# Patient Record
Sex: Male | Born: 1940
Health system: Southern US, Community
[De-identification: ages and names within clinical notes are randomized; demographics above are authoritative.]

## PROBLEM LIST (undated history)

## (undated) DIAGNOSIS — Q549 Hypospadias, unspecified: Secondary | ICD-10-CM

## (undated) DIAGNOSIS — T7840XA Allergy, unspecified, initial encounter: Secondary | ICD-10-CM

## (undated) DIAGNOSIS — E785 Hyperlipidemia, unspecified: Secondary | ICD-10-CM

## (undated) DIAGNOSIS — I10 Essential (primary) hypertension: Secondary | ICD-10-CM

## (undated) DIAGNOSIS — I2510 Atherosclerotic heart disease of native coronary artery without angina pectoris: Secondary | ICD-10-CM

## (undated) DIAGNOSIS — E7800 Pure hypercholesterolemia, unspecified: Secondary | ICD-10-CM

## (undated) DIAGNOSIS — G4730 Sleep apnea, unspecified: Secondary | ICD-10-CM

## (undated) DIAGNOSIS — K219 Gastro-esophageal reflux disease without esophagitis: Secondary | ICD-10-CM

## (undated) DIAGNOSIS — I6529 Occlusion and stenosis of unspecified carotid artery: Secondary | ICD-10-CM

## (undated) HISTORY — DX: Allergy, unspecified, initial encounter: T78.40XA

## (undated) HISTORY — DX: Occlusion and stenosis of unspecified carotid artery: I65.29

## (undated) HISTORY — PX: EYE SURGERY: SHX253

## (undated) HISTORY — DX: Hypospadias, unspecified: Q54.9

## (undated) HISTORY — DX: Pure hypercholesterolemia, unspecified: E78.00

## (undated) HISTORY — DX: Essential (primary) hypertension: I10

## (undated) HISTORY — DX: Sleep apnea, unspecified: G47.30

## (undated) HISTORY — PX: NISSEN FUNDOPLICATION: SHX2091

## (undated) HISTORY — PX: TONSILLECTOMY: SHX5217

## (undated) HISTORY — DX: Gastro-esophageal reflux disease without esophagitis: K21.9

## (undated) HISTORY — DX: Atherosclerotic heart disease of native coronary artery without angina pectoris: I25.10

## (undated) HISTORY — PX: VEIN LIGATION AND STRIPPING: SHX2653

## (undated) HISTORY — DX: Hyperlipidemia, unspecified: E78.5

## (undated) HISTORY — PX: NASAL SINUS SURGERY: SHX719

---

## 2006-11-09 DIAGNOSIS — I2510 Atherosclerotic heart disease of native coronary artery without angina pectoris: Secondary | ICD-10-CM

## 2006-11-09 HISTORY — DX: Atherosclerotic heart disease of native coronary artery without angina pectoris: I25.10

## 2007-07-28 ENCOUNTER — Encounter: Payer: Self-pay | Admitting: Internal Medicine

## 2007-07-28 DIAGNOSIS — G4730 Sleep apnea, unspecified: Secondary | ICD-10-CM

## 2007-07-28 HISTORY — DX: Sleep apnea, unspecified: G47.30

## 2007-09-29 ENCOUNTER — Encounter: Payer: Self-pay | Admitting: Internal Medicine

## 2007-10-10 ENCOUNTER — Encounter: Payer: Self-pay | Admitting: Internal Medicine

## 2008-01-04 ENCOUNTER — Encounter: Payer: Self-pay | Admitting: Internal Medicine

## 2008-01-05 ENCOUNTER — Ambulatory Visit: Payer: Self-pay | Admitting: Internal Medicine

## 2008-01-05 DIAGNOSIS — G4730 Sleep apnea, unspecified: Secondary | ICD-10-CM

## 2008-01-05 DIAGNOSIS — G4710 Hypersomnia, unspecified: Secondary | ICD-10-CM | POA: Insufficient documentation

## 2008-01-05 DIAGNOSIS — J310 Chronic rhinitis: Secondary | ICD-10-CM | POA: Insufficient documentation

## 2008-01-06 ENCOUNTER — Emergency Department (HOSPITAL_COMMUNITY): Admission: EM | Admit: 2008-01-06 | Discharge: 2008-01-06 | Payer: Self-pay | Admitting: Emergency Medicine

## 2008-01-08 DIAGNOSIS — G4733 Obstructive sleep apnea (adult) (pediatric): Secondary | ICD-10-CM | POA: Insufficient documentation

## 2008-01-08 DIAGNOSIS — E119 Type 2 diabetes mellitus without complications: Secondary | ICD-10-CM | POA: Insufficient documentation

## 2008-01-08 DIAGNOSIS — E785 Hyperlipidemia, unspecified: Secondary | ICD-10-CM | POA: Insufficient documentation

## 2008-01-08 DIAGNOSIS — I10 Essential (primary) hypertension: Secondary | ICD-10-CM | POA: Insufficient documentation

## 2008-01-08 DIAGNOSIS — J309 Allergic rhinitis, unspecified: Secondary | ICD-10-CM | POA: Insufficient documentation

## 2008-01-08 DIAGNOSIS — G4730 Sleep apnea, unspecified: Secondary | ICD-10-CM | POA: Insufficient documentation

## 2008-01-08 DIAGNOSIS — I2510 Atherosclerotic heart disease of native coronary artery without angina pectoris: Secondary | ICD-10-CM | POA: Insufficient documentation

## 2008-02-10 ENCOUNTER — Ambulatory Visit: Payer: Self-pay | Admitting: Internal Medicine

## 2008-09-17 ENCOUNTER — Ambulatory Visit: Payer: Self-pay | Admitting: Internal Medicine

## 2008-10-23 ENCOUNTER — Telehealth (INDEPENDENT_AMBULATORY_CARE_PROVIDER_SITE_OTHER): Payer: Self-pay | Admitting: *Deleted

## 2008-10-23 ENCOUNTER — Ambulatory Visit: Payer: Self-pay | Admitting: Internal Medicine

## 2008-10-23 DIAGNOSIS — J018 Other acute sinusitis: Secondary | ICD-10-CM | POA: Insufficient documentation

## 2008-10-23 DIAGNOSIS — J209 Acute bronchitis, unspecified: Secondary | ICD-10-CM | POA: Insufficient documentation

## 2008-10-26 ENCOUNTER — Ambulatory Visit (HOSPITAL_BASED_OUTPATIENT_CLINIC_OR_DEPARTMENT_OTHER): Admission: RE | Admit: 2008-10-26 | Discharge: 2008-10-26 | Payer: Self-pay | Admitting: Internal Medicine

## 2008-10-26 ENCOUNTER — Ambulatory Visit: Payer: Self-pay | Admitting: Radiology

## 2008-10-26 ENCOUNTER — Ambulatory Visit: Payer: Self-pay | Admitting: Internal Medicine

## 2008-10-26 DIAGNOSIS — J986 Disorders of diaphragm: Secondary | ICD-10-CM | POA: Insufficient documentation

## 2008-10-26 DIAGNOSIS — R05 Cough: Secondary | ICD-10-CM | POA: Insufficient documentation

## 2008-10-26 DIAGNOSIS — R059 Cough, unspecified: Secondary | ICD-10-CM | POA: Insufficient documentation

## 2008-11-06 ENCOUNTER — Ambulatory Visit: Payer: Self-pay | Admitting: Internal Medicine

## 2008-11-06 ENCOUNTER — Ambulatory Visit (HOSPITAL_BASED_OUTPATIENT_CLINIC_OR_DEPARTMENT_OTHER): Admission: RE | Admit: 2008-11-06 | Discharge: 2008-11-06 | Payer: Self-pay | Admitting: Internal Medicine

## 2008-11-06 ENCOUNTER — Ambulatory Visit: Payer: Self-pay | Admitting: Diagnostic Radiology

## 2008-11-06 LAB — CONVERTED CEMR LAB
ALT: 28 units/L (ref 0–53)
AST: 24 units/L (ref 0–37)
Albumin: 3.4 g/dL — ABNORMAL LOW (ref 3.5–5.2)
Alkaline Phosphatase: 32 units/L — ABNORMAL LOW (ref 39–117)
BUN: 30 mg/dL — ABNORMAL HIGH (ref 6–23)
Bilirubin, Direct: 0.2 mg/dL (ref 0.0–0.3)
CO2: 30 meq/L (ref 19–32)
Calcium: 9.4 mg/dL (ref 8.4–10.5)
Chloride: 103 meq/L (ref 96–112)
Cholesterol: 173 mg/dL (ref 0–200)
Creatinine, Ser: 1.2 mg/dL (ref 0.4–1.5)
Creatinine,U: 110 mg/dL
GFR calc Af Amer: 77 mL/min
GFR calc non Af Amer: 64 mL/min
Glucose, Bld: 119 mg/dL — ABNORMAL HIGH (ref 70–99)
HDL: 36 mg/dL — ABNORMAL LOW (ref >39.0)
Hgb A1c MFr Bld: 7.2 % — ABNORMAL HIGH (ref 4.6–6.0)
LDL Cholesterol: 110 mg/dL — ABNORMAL HIGH (ref 0–99)
Microalb Creat Ratio: 1.8 mg/g (ref 0.0–30.0)
Microalb, Ur: 0.2 mg/dL (ref 0.0–1.9)
Potassium: 4.2 meq/L (ref 3.5–5.1)
Sodium: 139 meq/L (ref 135–145)
TSH: 0.93 microintl units/mL (ref 0.35–5.50)
Total Bilirubin: 1.3 mg/dL — ABNORMAL HIGH (ref 0.3–1.2)
Total CHOL/HDL Ratio: 4.8
Total Protein: 6.7 g/dL (ref 6.0–8.3)
Triglycerides: 134 mg/dL (ref 0–149)
VLDL: 27 mg/dL (ref 0–40)

## 2008-11-14 ENCOUNTER — Ambulatory Visit: Payer: Self-pay | Admitting: Internal Medicine

## 2009-01-29 LAB — HM COLONOSCOPY: HM Colonoscopy: NORMAL

## 2009-02-05 ENCOUNTER — Ambulatory Visit: Payer: Self-pay | Admitting: Internal Medicine

## 2009-02-05 LAB — CONVERTED CEMR LAB
ALT: 27 units/L (ref 0–53)
AST: 33 units/L (ref 0–37)
BUN: 36 mg/dL — ABNORMAL HIGH (ref 6–23)
CO2: 31 meq/L (ref 19–32)
Calcium: 9.9 mg/dL (ref 8.4–10.5)
Chloride: 103 meq/L (ref 96–112)
Cholesterol: 170 mg/dL (ref 0–200)
Creatinine, Ser: 1.4 mg/dL (ref 0.4–1.5)
GFR calc non Af Amer: 53.3 mL/min (ref >60)
Glucose, Bld: 125 mg/dL — ABNORMAL HIGH (ref 70–99)
HDL: 38.2 mg/dL — ABNORMAL LOW (ref >39.00)
Hgb A1c MFr Bld: 6.7 % — ABNORMAL HIGH (ref 4.6–6.5)
LDL Cholesterol: 115 mg/dL — ABNORMAL HIGH (ref 0–99)
PSA: 1.52 ng/mL (ref 0.10–4.00)
Potassium: 4.3 meq/L (ref 3.5–5.1)
Sodium: 140 meq/L (ref 135–145)
Total CHOL/HDL Ratio: 4
Triglycerides: 82 mg/dL (ref 0.0–149.0)
VLDL: 16.4 mg/dL (ref 0.0–40.0)

## 2009-02-12 ENCOUNTER — Ambulatory Visit: Payer: Self-pay | Admitting: Interventional Radiology

## 2009-02-12 ENCOUNTER — Ambulatory Visit: Payer: Self-pay | Admitting: Internal Medicine

## 2009-02-12 ENCOUNTER — Ambulatory Visit (HOSPITAL_BASED_OUTPATIENT_CLINIC_OR_DEPARTMENT_OTHER): Admission: RE | Admit: 2009-02-12 | Discharge: 2009-02-12 | Payer: Self-pay | Admitting: Internal Medicine

## 2009-02-12 ENCOUNTER — Telehealth: Payer: Self-pay | Admitting: Internal Medicine

## 2009-02-12 DIAGNOSIS — G2581 Restless legs syndrome: Secondary | ICD-10-CM | POA: Insufficient documentation

## 2009-02-12 DIAGNOSIS — M25559 Pain in unspecified hip: Secondary | ICD-10-CM | POA: Insufficient documentation

## 2009-03-01 ENCOUNTER — Encounter: Payer: Self-pay | Admitting: Internal Medicine

## 2009-03-05 IMAGING — CR DG ABDOMEN ACUTE W/ 1V CHEST
4 series · 4 of 4 positions shown · non-contrast
Comparison: none

CLINICAL DATA: 66 year-old male with abdominal pain. Nausea.
 ACUTE ABDOMEN SERIES:

[w chest pa]
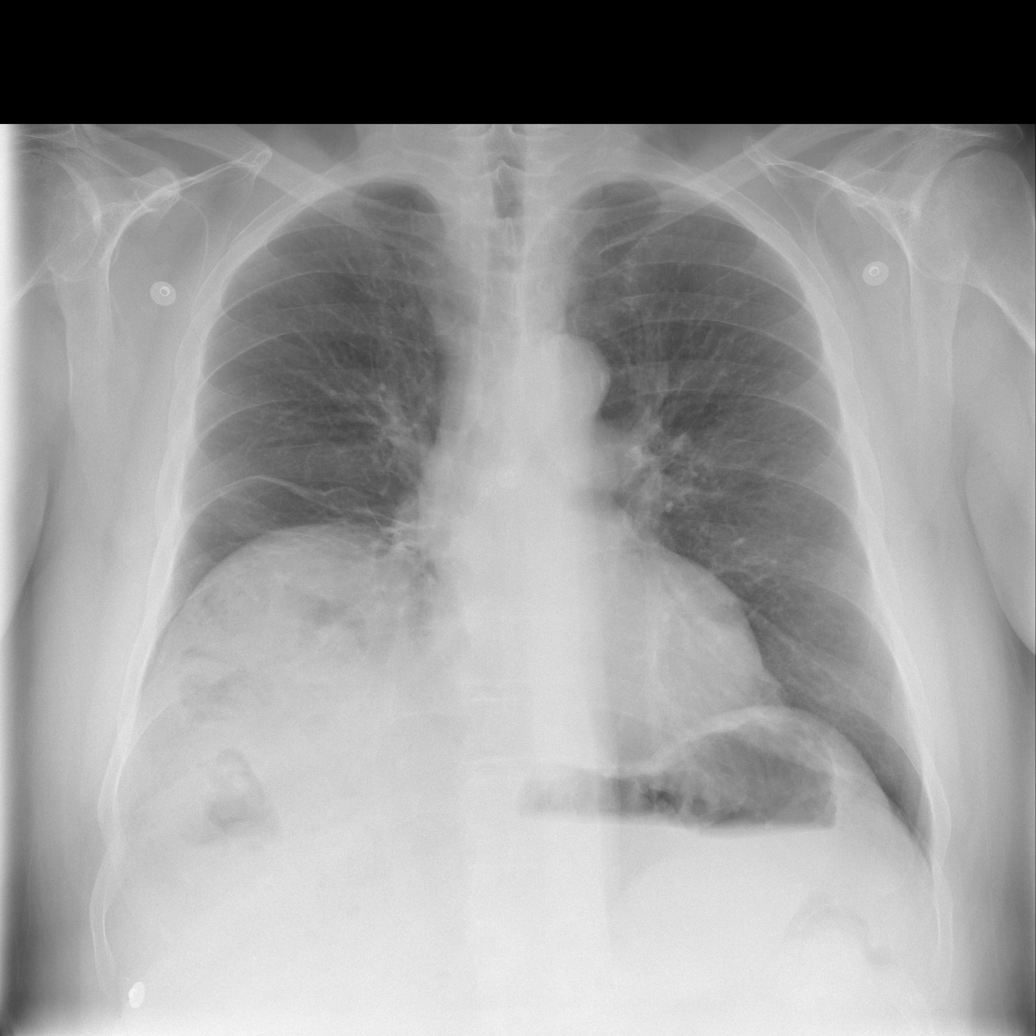

[w abdomen upright *]
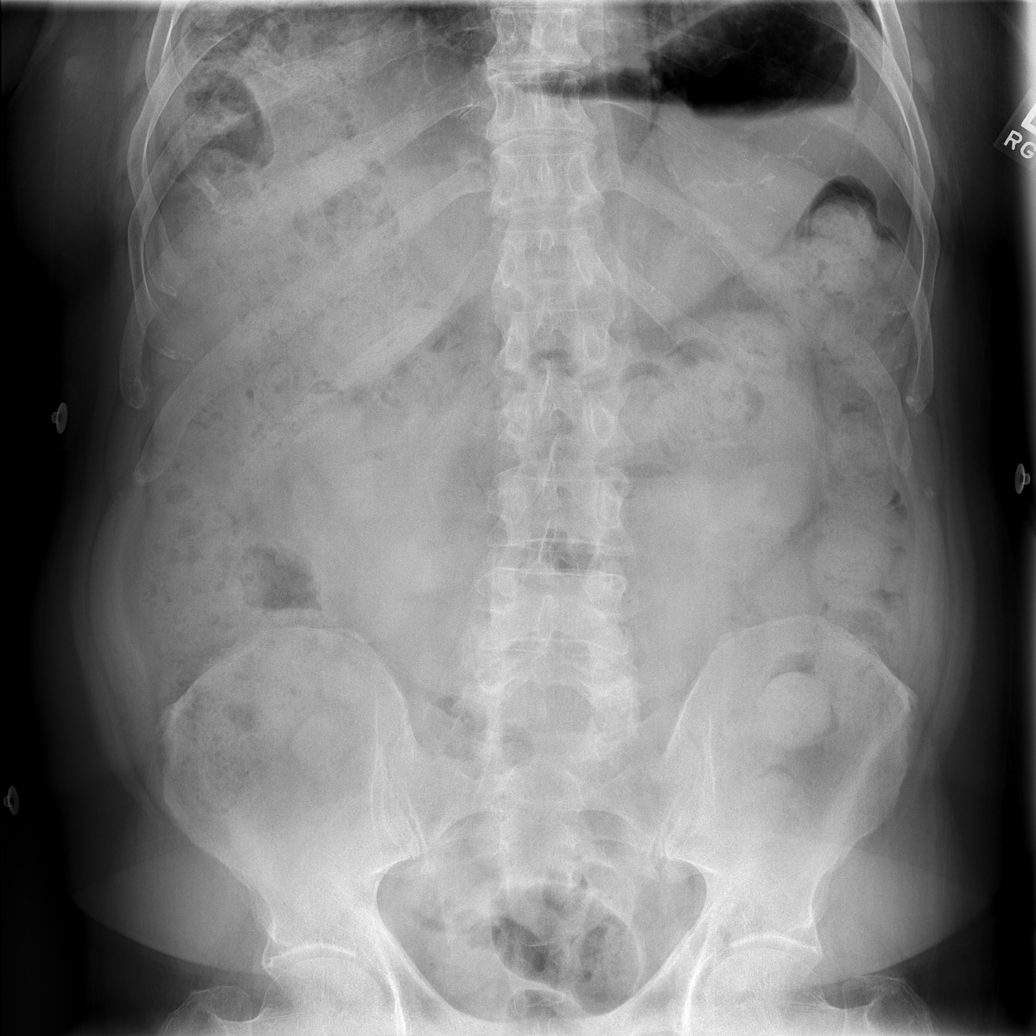

[t abdomen supine (1 of 2)]
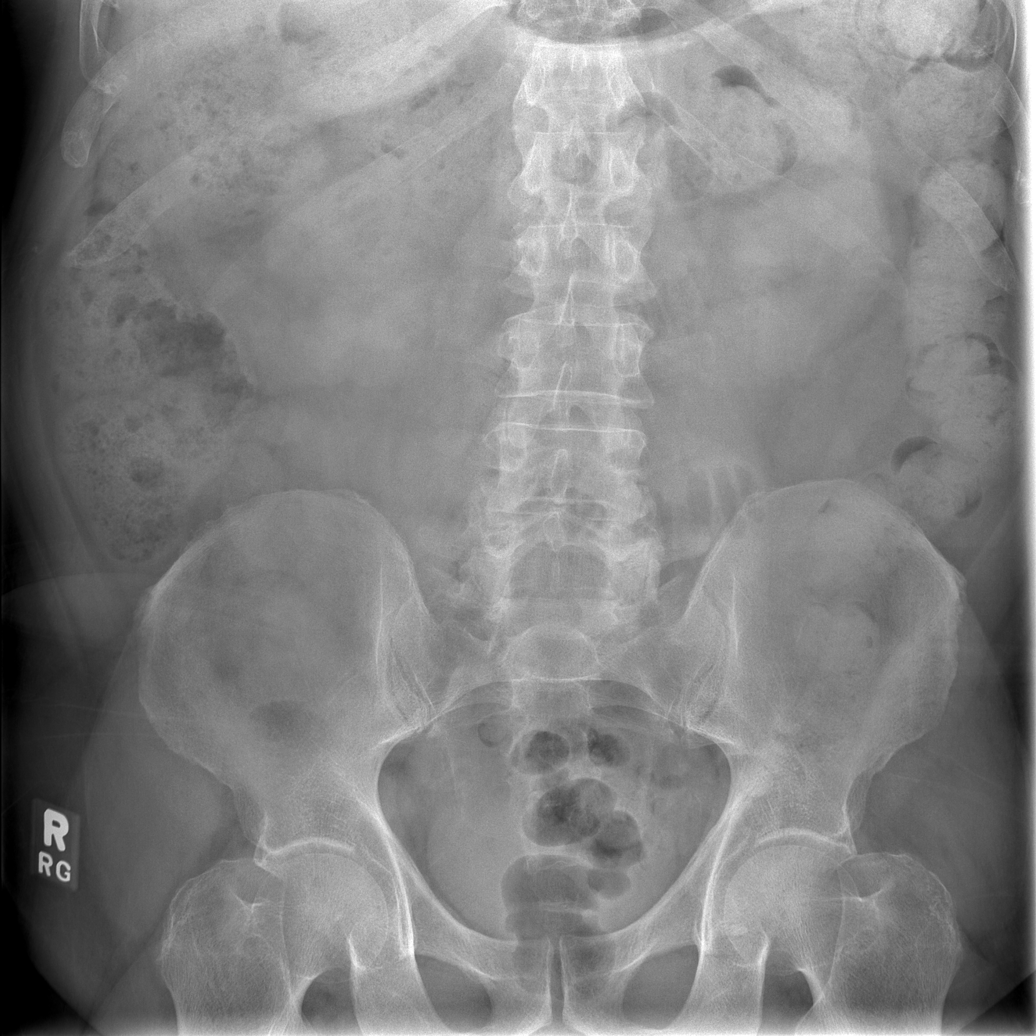

[t abdomen supine (2 of 2)]
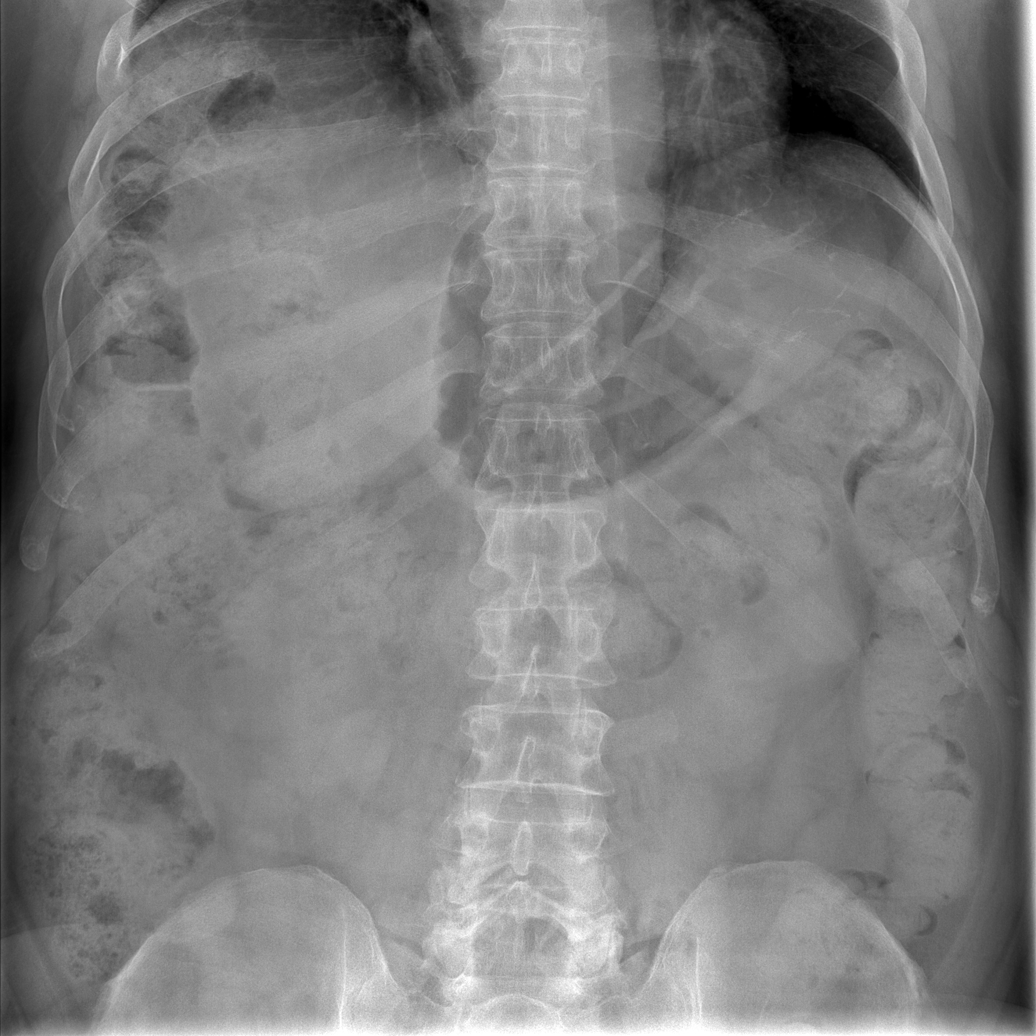

[4 of 4 positions shown; findings below may reference images not displayed]

FINDINGS: Normal mediastinum and cardiac silhouette.  There is elevation of the right hemidiaphragm. There is associated right basilar atelectasis. No evidence of focal infiltrate.  No free air beneath the hemidiaphragms.
 Two views of the abdomen demonstrate no dilated loops of large or small bowel. There is a moderate amount of stool within the ascending transverse and descending colon. There is gas in the rectum.
IMPRESSION: 1.  No acute cardiopulmonary process.
 2.  Elevation of the right hemidiaphragm with associated atelectasis.
 3.  No evidence of bowel obstruction or intraperitoneal free air.
 4.  Moderate amount of stool within entirety of the colon.

## 2009-03-05 IMAGING — US US ABDOMEN COMPLETE
1 series · 14 of 25 positions shown · non-contrast
Comparison: None.

CLINICAL DATA: Abdominal pain. 
ABDOMEN ULTRASOUND:
TECHNIQUE: Complete abdominal ultrasound examination was performed including evaluation of the liver, gallbladder, bile ducts, pancreas, kidneys, spleen, IVC, and abdominal aorta.

[Series 1: unknown · 0.33mm/px · 14 of 58 slices shown]
[im 1/58]
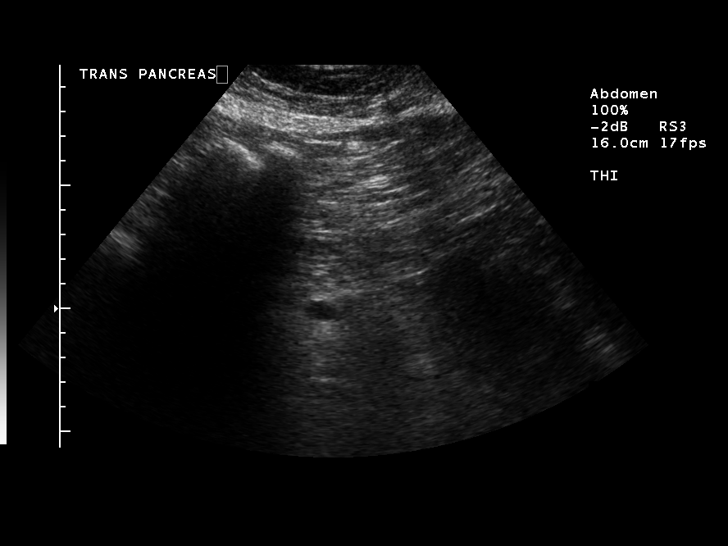
[im 5/58]
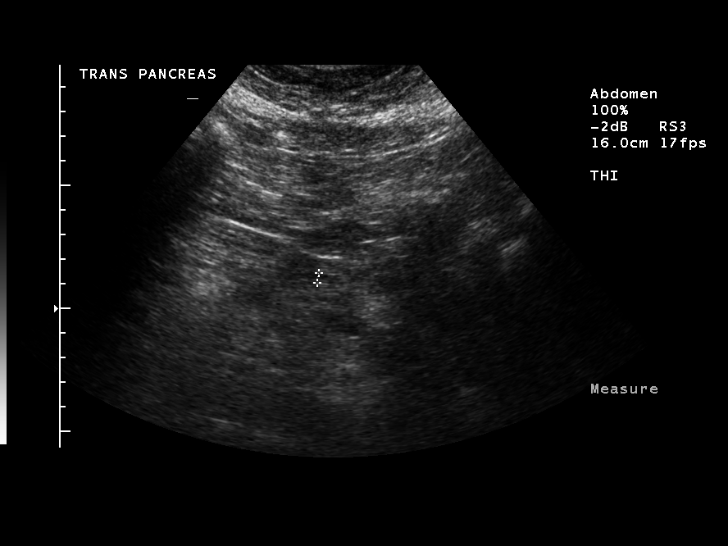
[im 10/58]
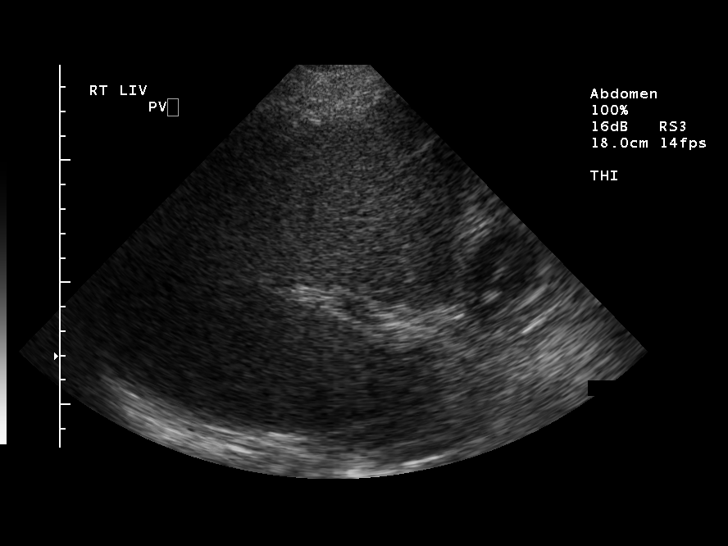
[im 15/58]
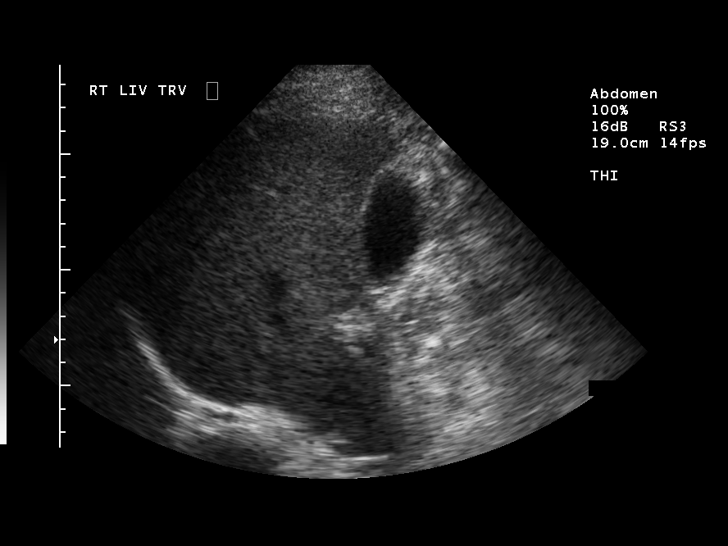
[im 20/58]
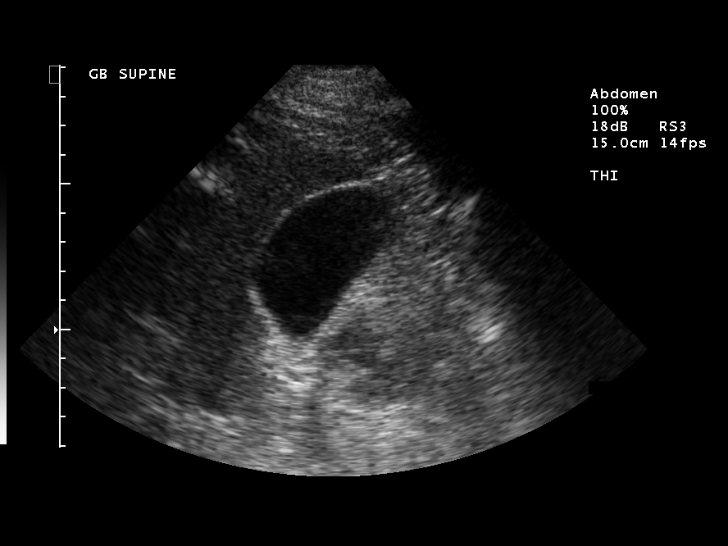
[im 22/58]
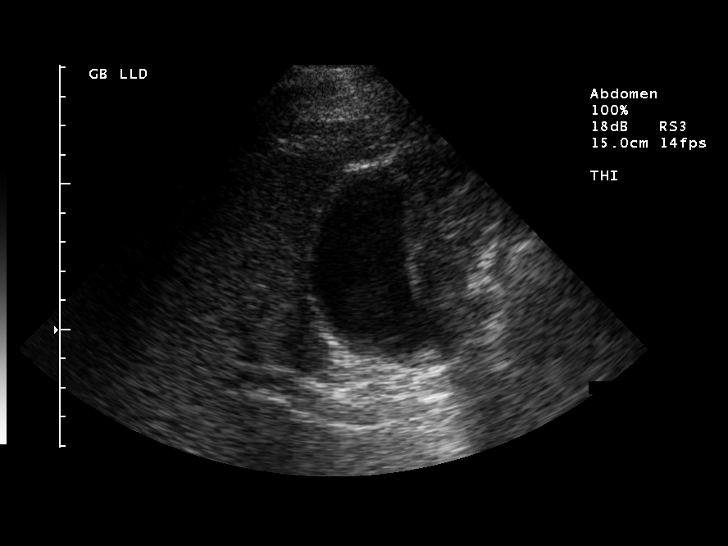
[im 27/58]
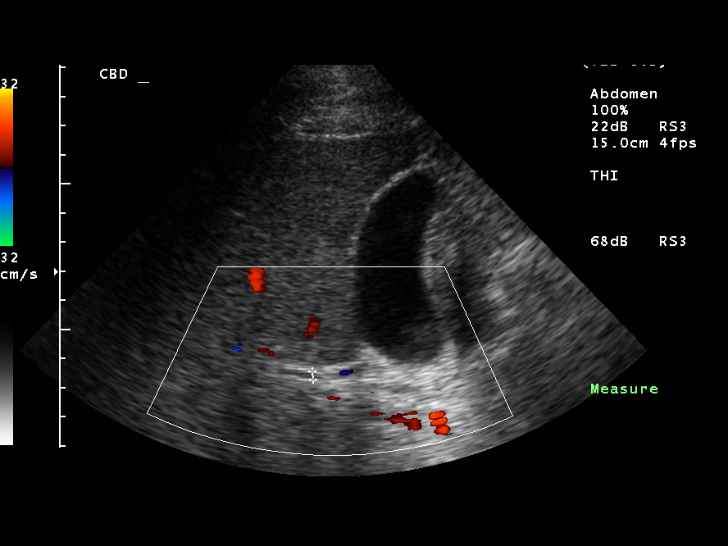
[im 31/58]
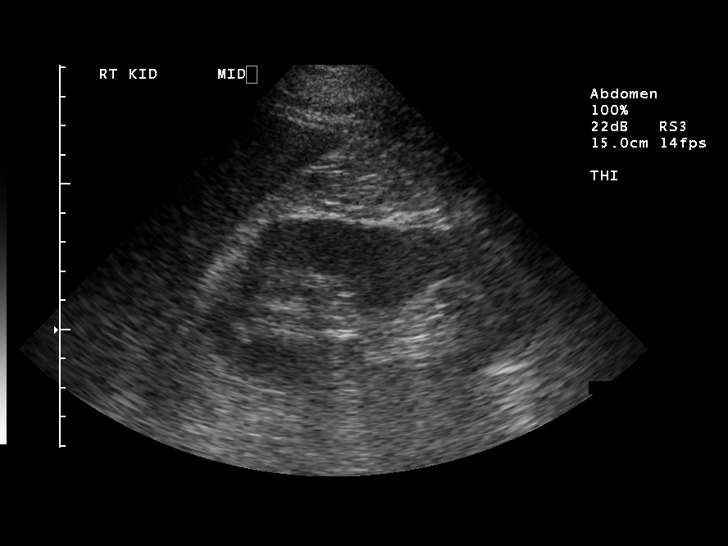
[im 36/58]
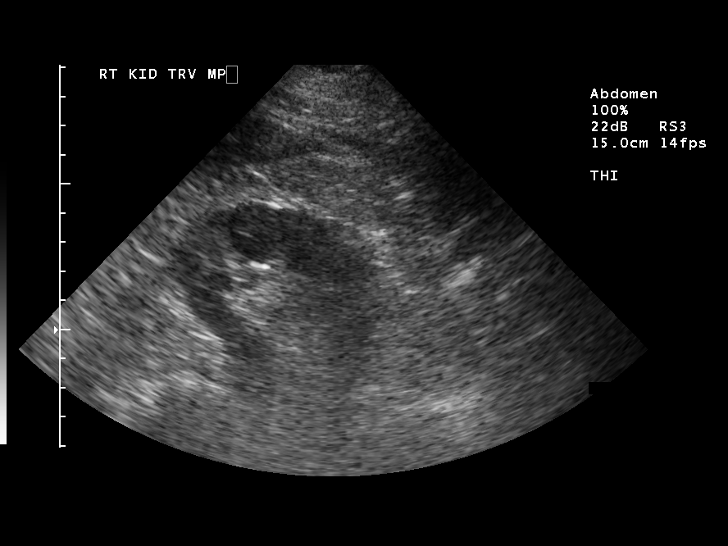
[im 39/58]
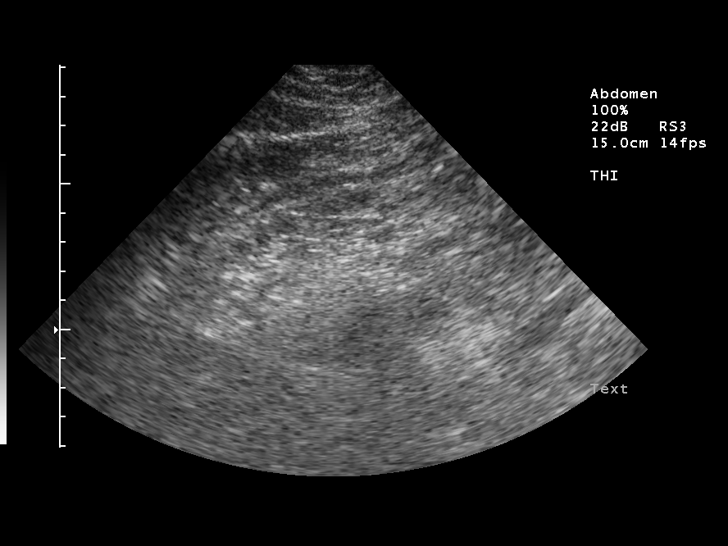
[im 43/58]
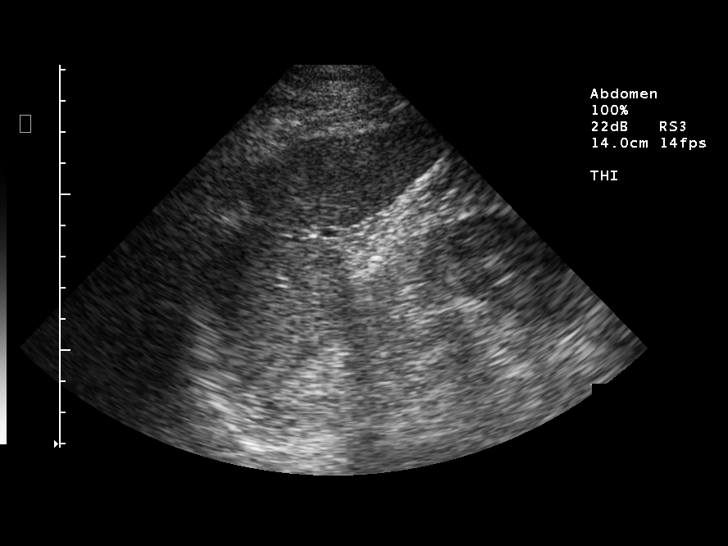
[im 48/58]
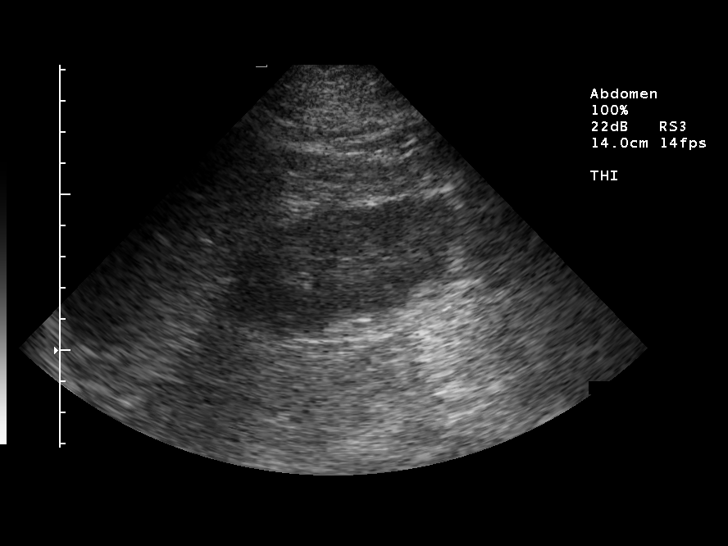
[im 53/58]
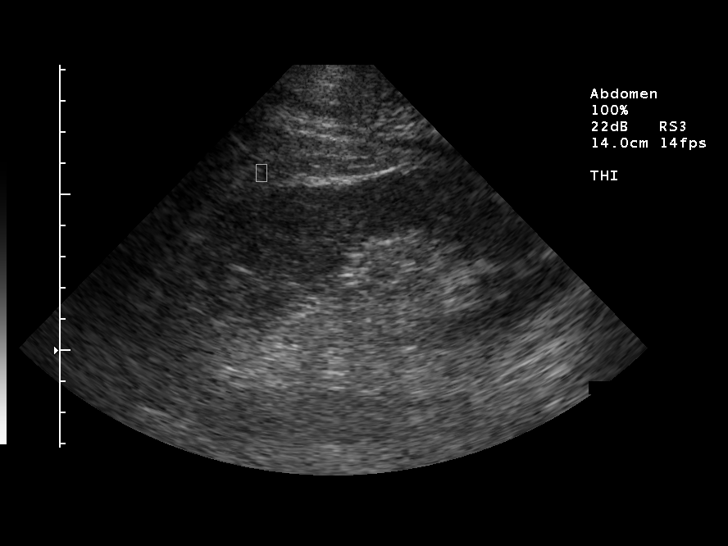
[im 58/58]
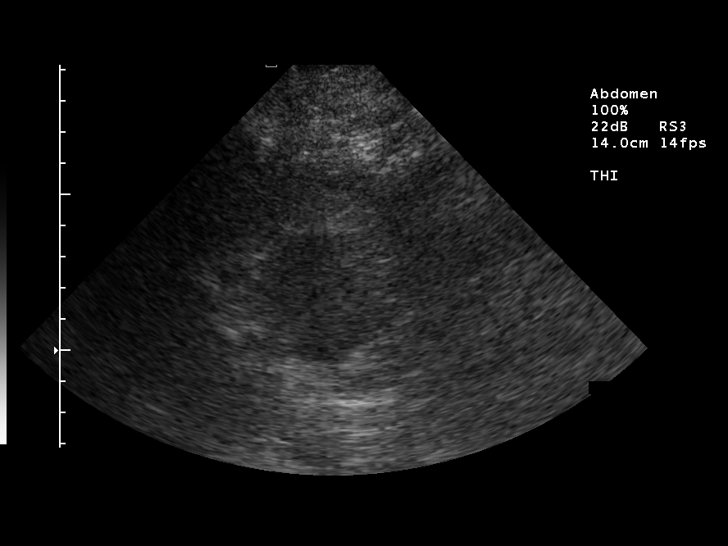

[14 of 25 positions shown; findings below may reference images not displayed]

FINDINGS: The gallbladder is distended and there are no gallstones.  The patient is nontender over the gallbladder.  The common bile duct is not visualized optimally, but does not appear to be dilated. The liver is echogenic and difficult to scan.   The IVC is not well seen due to bowel gas.  The pancreatic duct is mildly dilated at 4 mm.  The spleen, kidneys and aorta are normal.    The proximal and mid portions of the aorta are not well seen due to bowel gas.
IMPRESSION: 1.  Negative for gallstones.  
2.  Mild dilatation of the pancreatic duct.  Consider CT if the patient has pancreatitis or abnormal blood work.

## 2009-03-06 ENCOUNTER — Encounter: Payer: Self-pay | Admitting: Internal Medicine

## 2009-03-19 ENCOUNTER — Telehealth: Payer: Self-pay | Admitting: Internal Medicine

## 2009-03-20 ENCOUNTER — Ambulatory Visit: Payer: Self-pay | Admitting: Internal Medicine

## 2009-03-20 LAB — CONVERTED CEMR LAB
BUN: 35 mg/dL — ABNORMAL HIGH (ref 6–23)
CO2: 30 meq/L (ref 19–32)
Calcium: 9 mg/dL (ref 8.4–10.5)
Chloride: 107 meq/L (ref 96–112)
Creatinine, Ser: 1.5 mg/dL (ref 0.4–1.5)
Creatinine,U: 128.1 mg/dL
GFR calc non Af Amer: 49.2 mL/min (ref >60)
Glucose, Bld: 118 mg/dL — ABNORMAL HIGH (ref 70–99)
Hgb A1c MFr Bld: 6.6 % — ABNORMAL HIGH (ref 4.6–6.5)
Microalb Creat Ratio: 6.2 mg/g (ref 0.0–30.0)
Microalb, Ur: 0.8 mg/dL (ref 0.0–1.9)
Potassium: 4.2 meq/L (ref 3.5–5.1)
Sodium: 142 meq/L (ref 135–145)
TSH: 0.92 microintl units/mL (ref 0.35–5.50)

## 2009-03-25 ENCOUNTER — Ambulatory Visit: Payer: Self-pay | Admitting: Internal Medicine

## 2009-09-11 ENCOUNTER — Ambulatory Visit: Payer: Self-pay | Admitting: Internal Medicine

## 2009-09-11 LAB — CONVERTED CEMR LAB
BUN: 51 mg/dL — ABNORMAL HIGH (ref 6–23)
CO2: 25 meq/L (ref 19–32)
Calcium: 9.6 mg/dL (ref 8.4–10.5)
Chloride: 104 meq/L (ref 96–112)
Creatinine, Ser: 1.59 mg/dL — ABNORMAL HIGH (ref 0.40–1.50)
Creatinine, Urine: 126 mg/dL
Glucose, Bld: 140 mg/dL — ABNORMAL HIGH (ref 70–99)
Hgb A1c MFr Bld: 6.4 % — ABNORMAL HIGH (ref 4.6–6.1)
Microalb Creat Ratio: 4 mg/g (ref 0.0–30.0)
Microalb, Ur: 0.5 mg/dL (ref 0.00–1.89)
Potassium: 4.7 meq/L (ref 3.5–5.3)
Sodium: 139 meq/L (ref 135–145)

## 2009-09-17 ENCOUNTER — Ambulatory Visit: Payer: Self-pay | Admitting: Internal Medicine

## 2009-09-17 DIAGNOSIS — N138 Other obstructive and reflux uropathy: Secondary | ICD-10-CM | POA: Insufficient documentation

## 2009-09-17 DIAGNOSIS — N401 Enlarged prostate with lower urinary tract symptoms: Secondary | ICD-10-CM

## 2009-09-17 DIAGNOSIS — I8390 Asymptomatic varicose veins of unspecified lower extremity: Secondary | ICD-10-CM | POA: Insufficient documentation

## 2009-09-19 ENCOUNTER — Telehealth: Payer: Self-pay | Admitting: Internal Medicine

## 2009-09-24 ENCOUNTER — Telehealth: Payer: Self-pay | Admitting: Internal Medicine

## 2009-10-14 ENCOUNTER — Telehealth: Payer: Self-pay | Admitting: Internal Medicine

## 2009-11-13 ENCOUNTER — Ambulatory Visit (HOSPITAL_BASED_OUTPATIENT_CLINIC_OR_DEPARTMENT_OTHER): Admission: RE | Admit: 2009-11-13 | Discharge: 2009-11-13 | Payer: Self-pay | Admitting: Internal Medicine

## 2009-11-13 ENCOUNTER — Ambulatory Visit: Payer: Self-pay | Admitting: Interventional Radiology

## 2009-11-13 ENCOUNTER — Telehealth: Payer: Self-pay | Admitting: Internal Medicine

## 2009-11-13 ENCOUNTER — Ambulatory Visit: Payer: Self-pay | Admitting: Internal Medicine

## 2009-11-13 DIAGNOSIS — N259 Disorder resulting from impaired renal tubular function, unspecified: Secondary | ICD-10-CM | POA: Insufficient documentation

## 2009-11-13 LAB — CONVERTED CEMR LAB
Albumin ELP: 59.3 % (ref 55.8–66.1)
Albumin, U: DETECTED %
Alpha 1, Urine: DETECTED % — AB
Alpha 2, Urine: DETECTED % — AB
Alpha-1-Globulin: 3.8 % (ref 2.9–4.9)
Alpha-2-Globulin: 10.4 % (ref 7.1–11.8)
BUN: 32 mg/dL — ABNORMAL HIGH (ref 6–23)
Beta Globulin: 6.5 % (ref 4.7–7.2)
Beta, Urine: DETECTED % — AB
CO2: 24 meq/L (ref 19–32)
Calcium: 9.3 mg/dL (ref 8.4–10.5)
Chloride: 101 meq/L (ref 96–112)
Creatinine, Ser: 1.45 mg/dL (ref 0.40–1.50)
Free Kappa Lt Chains,Ur: 1.23 mg/dL (ref 0.04–1.51)
Free Lambda Lt Chains,Ur: 0.08 mg/dL (ref 0.08–1.01)
Gamma Globulin, Urine: DETECTED % — AB
Gamma Globulin: 15 % (ref 11.1–18.8)
Glucose, Bld: 203 mg/dL — ABNORMAL HIGH (ref 70–99)
IgA: 115 mg/dL (ref 68–378)
IgG (Immunoglobin G), Serum: 1070 mg/dL (ref 694–1618)
IgM, Serum: 145 mg/dL (ref 60–263)
Potassium: 4.3 meq/L (ref 3.5–5.3)
Sodium: 139 meq/L (ref 135–145)
Total Protein, Serum Electrophoresis: 6.6 g/dL (ref 6.0–8.3)

## 2009-11-15 ENCOUNTER — Telehealth: Payer: Self-pay | Admitting: Internal Medicine

## 2009-12-16 ENCOUNTER — Ambulatory Visit: Payer: Self-pay | Admitting: Internal Medicine

## 2009-12-16 LAB — CONVERTED CEMR LAB
BUN: 32 mg/dL — ABNORMAL HIGH (ref 6–23)
CO2: 27 meq/L (ref 19–32)
Calcium: 9.3 mg/dL (ref 8.4–10.5)
Chloride: 106 meq/L (ref 96–112)
Creatinine, Ser: 1.43 mg/dL (ref 0.40–1.50)
Glucose, Bld: 99 mg/dL (ref 70–99)
Potassium: 4.2 meq/L (ref 3.5–5.3)
Sodium: 142 meq/L (ref 135–145)

## 2009-12-25 ENCOUNTER — Ambulatory Visit: Payer: Self-pay | Admitting: Internal Medicine

## 2009-12-26 ENCOUNTER — Telehealth: Payer: Self-pay | Admitting: Internal Medicine

## 2010-01-13 ENCOUNTER — Encounter: Payer: Self-pay | Admitting: Internal Medicine

## 2010-01-13 ENCOUNTER — Ambulatory Visit: Payer: Self-pay | Admitting: Vascular Surgery

## 2010-01-14 ENCOUNTER — Telehealth: Payer: Self-pay | Admitting: Internal Medicine

## 2010-02-18 ENCOUNTER — Encounter: Payer: Self-pay | Admitting: Internal Medicine

## 2010-02-18 LAB — HM DIABETES EYE EXAM: HM Diabetic Eye Exam: NORMAL

## 2010-02-24 ENCOUNTER — Telehealth: Payer: Self-pay | Admitting: Internal Medicine

## 2010-03-04 ENCOUNTER — Encounter: Payer: Self-pay | Admitting: Internal Medicine

## 2010-03-31 ENCOUNTER — Encounter: Payer: Self-pay | Admitting: Internal Medicine

## 2010-03-31 LAB — CONVERTED CEMR LAB
ALT: 19 units/L (ref 0–53)
AST: 22 units/L (ref 0–37)
BUN: 27 mg/dL — ABNORMAL HIGH (ref 6–23)
CO2: 24 meq/L (ref 19–32)
Calcium: 9.2 mg/dL (ref 8.4–10.5)
Chloride: 107 meq/L (ref 96–112)
Cholesterol: 152 mg/dL (ref 0–200)
Creatinine, Ser: 1.09 mg/dL (ref 0.40–1.50)
Glucose, Bld: 119 mg/dL — ABNORMAL HIGH (ref 70–99)
HDL: 39 mg/dL — ABNORMAL LOW (ref >39)
Hgb A1c MFr Bld: 6.4 % — ABNORMAL HIGH (ref <5.7)
LDL Cholesterol: 93 mg/dL (ref 0–99)
PSA: 0.41 ng/mL (ref 0.10–4.00)
Potassium: 4.4 meq/L (ref 3.5–5.3)
Sodium: 141 meq/L (ref 135–145)
Total CHOL/HDL Ratio: 3.9
Triglycerides: 102 mg/dL (ref <150)
VLDL: 20 mg/dL (ref 0–40)

## 2010-04-02 ENCOUNTER — Ambulatory Visit: Payer: Self-pay | Admitting: Diagnostic Radiology

## 2010-04-02 ENCOUNTER — Ambulatory Visit: Payer: Self-pay | Admitting: Internal Medicine

## 2010-04-02 ENCOUNTER — Ambulatory Visit (HOSPITAL_BASED_OUTPATIENT_CLINIC_OR_DEPARTMENT_OTHER)
Admission: RE | Admit: 2010-04-02 | Discharge: 2010-04-02 | Payer: Self-pay | Source: Home / Self Care | Admitting: Internal Medicine

## 2010-04-02 DIAGNOSIS — J329 Chronic sinusitis, unspecified: Secondary | ICD-10-CM | POA: Insufficient documentation

## 2010-04-02 LAB — HM DIABETES FOOT EXAM

## 2010-04-03 ENCOUNTER — Ambulatory Visit: Payer: Self-pay | Admitting: Internal Medicine

## 2010-04-28 ENCOUNTER — Telehealth: Payer: Self-pay | Admitting: Internal Medicine

## 2010-04-30 ENCOUNTER — Telehealth: Payer: Self-pay | Admitting: Internal Medicine

## 2010-09-22 ENCOUNTER — Ambulatory Visit: Payer: Self-pay | Admitting: Vascular Surgery

## 2010-10-13 ENCOUNTER — Ambulatory Visit: Payer: Self-pay | Admitting: Vascular Surgery

## 2010-10-21 ENCOUNTER — Ambulatory Visit: Payer: Self-pay | Admitting: Vascular Surgery

## 2010-11-17 ENCOUNTER — Encounter: Payer: Self-pay | Admitting: Internal Medicine

## 2010-11-17 LAB — CONVERTED CEMR LAB
BUN: 27 mg/dL — ABNORMAL HIGH (ref 6–23)
CO2: 20 meq/L (ref 19–32)
Calcium: 9.5 mg/dL (ref 8.4–10.5)
Chloride: 107 meq/L (ref 96–112)
Cholesterol: 148 mg/dL (ref 0–200)
Creatinine, Ser: 0.99 mg/dL (ref 0.40–1.50)
Glucose, Bld: 122 mg/dL — ABNORMAL HIGH (ref 70–99)
HDL: 37 mg/dL — ABNORMAL LOW (ref >39)
Hgb A1c MFr Bld: 6.2 % — ABNORMAL HIGH (ref <5.7)
LDL Cholesterol: 94 mg/dL (ref 0–99)
PSA: 0.42 ng/mL (ref <=4.00)
Potassium: 4.2 meq/L (ref 3.5–5.3)
Sodium: 139 meq/L (ref 135–145)
Total CHOL/HDL Ratio: 4
Triglycerides: 87 mg/dL (ref <150)
VLDL: 17 mg/dL (ref 0–40)

## 2010-11-19 ENCOUNTER — Encounter: Payer: Self-pay | Admitting: Internal Medicine

## 2010-11-19 ENCOUNTER — Ambulatory Visit
Admission: RE | Admit: 2010-11-19 | Discharge: 2010-11-19 | Payer: Self-pay | Source: Home / Self Care | Attending: Internal Medicine | Admitting: Internal Medicine

## 2010-11-19 DIAGNOSIS — R002 Palpitations: Secondary | ICD-10-CM | POA: Insufficient documentation

## 2010-11-19 DIAGNOSIS — N3945 Continuous leakage: Secondary | ICD-10-CM | POA: Insufficient documentation

## 2010-12-02 ENCOUNTER — Ambulatory Visit: Admit: 2010-12-02 | Payer: Self-pay | Admitting: Vascular Surgery

## 2010-12-09 ENCOUNTER — Encounter: Payer: Self-pay | Admitting: Internal Medicine

## 2010-12-09 NOTE — Progress Notes (Signed)
Summary: Test Strips  Phone Note Refill Request Message from:  Patient on September 19, 2009 10:36 AM  Pt. needs more Free style lite testing strips called in & if pharmacy has a newer kind, he would rather have the newest available to him.   Next Appointment Scheduled: 11/13/09- Dr.Harlow Basley Initial call taken by: Michaelle Copas,  September 19, 2009 10:36 AM  Follow-up for Phone Call        ok to refill x 5.  find out if he has newest free style meter.  If he does not, he can come to office to pick up gluocometer Follow-up by: D. Thomos Lemons DO,  September 19, 2009 11:28 AM  Additional Follow-up for Phone Call Additional follow up Details #1::        patient states that he has a current glucomter, he was inquiring about the test strips. He state that he will check with the pharamcist about the strips, if we would provide the rx for the test strips. Patient informed rx for test strips would be sent to pharmacy Additional Follow-up by: Glendell Docker CMA,  September 19, 2009 12:22 PM    New/Updated Medications: FREESTYLE LITE TEST  STRP (GLUCOSE BLOOD) use to test blood sugar three times a day DX CODE 250.00 Prescriptions: FREESTYLE LITE TEST  STRP (GLUCOSE BLOOD) use to test blood sugar three times a day DX CODE 250.00  #100 x 3   Entered by:   Glendell Docker CMA   Authorized by:   D. Thomos Lemons DO   Signed by:   Glendell Docker CMA on 09/19/2009   Method used:   [facility]        [phone]       [address]       [place], [place]  [address]       Ph: [phone]       Fax: [phone]   RxID:   [id number]

## 2010-12-09 NOTE — Miscellaneous (Signed)
Summary: Eye Exam  Clinical Lists Changes  Observations: Added new observation of DMEYEEXAMNXT: 02/2011 (03/04/2010 8:31) Added new observation of DMEYEEXMRES: normal (02/18/2010 8:32) Added new observation of EYE EXAM BY: Mateo Flow (02/18/2010 8:32) Added new observation of DIAB EYE EX: normal (02/18/2010 8:32)       Diabetes Management Exam:    Eye Exam:       Eye Exam done elsewhere          Date: 02/18/2010          Results: normal          Done by: Mateo Flow

## 2010-12-09 NOTE — Assessment & Plan Note (Signed)
Summary: 1 month follow up/mhf   Vital Signs:  Patient profile:   70 year old male Height:      69 inches Weight:      236.50 pounds BMI:     35.05 O2 Sat:      97 % on Room air Temp:     97.7 degrees F oral Pulse rate:   55 / minute Pulse rhythm:   regular Resp:     18 per minute BP sitting:   100 / 60  (left arm) Cuff size:   large  Vitals Entered By: Glendell Docker CMA (December 25, 2009 10:56 AM)  O2 Flow:  Room air  Primary Care Provider:  D. Thomos Lemons DO  CC:  1 Month Follow up .  History of Present Illness: 1 Month Follow up   70 y/o white male c/o mild URI symptoms x 2-3 wks.  symptoms worse since stopping allergra D.  (decongestant portion).  he reports nasal occ yellowish  light headed ,  post nasal gtt worse at night  DM II -  low blood sugar 115.  no hypoglycemia  Htn - occ lightheaded,  worse when he stands up too quickly   good med compliance  renal insuff - cr back to baseline of 1.43.  he stopped use of NSAIDs  Allergies: 1)  ! * Ivp Dye  Past History:  Past Medical History: Allergic Rhinitis Coronary Heart Disease- mild at cath 2008  Diabetes, Type 2 Hyperlipidemia    Hypertension  Sleep Apnea- NPSG [place], 07/28/07 AHI 79.9  GERD Elevated right hemidiaphragm of unclear etiology   Past Surgical History: Tonsillectomy Nissen fundoplication  sinus surgery 1996  heart cath 5/08       Family History: Coronary Heart Disease        Social History: Patient never smoked.  retired Holiday representative  Alcohol use-no Married 2nd marriage (13 yrs)  3 children        Review of Systems  The patient denies weight gain and chest pain.    Physical Exam  General:  alert and overweight-appearing.   Eyes:  pupils equal, pupils round, and pupils reactive to light.   Ears:  R ear normal and L ear normal.   Mouth:  postnasal drip.   Lungs:  normal respiratory effort and normal breath sounds.   Heart:  normal rate, regular rhythm, no murmur,  and no gallop.   Extremities:  No lower extremity edema  Neurologic:  cranial nerves II-XII intact and gait normal.   Psych:  normally interactive and good eye contact.     Impression & Recommendations:  Problem # 1:  RENAL INSUFFICIENCY (ICD-588.9) Assessment Improved renal u/s normal.  Cr back to baseline.  BP low normal.  reduce benicar dose  Problem # 2:  HYPERTENSION (ICD-401.9) BP still too low.  decrease benicar dose.  His updated medication list for this problem includes:    Benicar Hct 20-12.5 Mg Tabs (Olmesartan medoxomil-hctz) ..... One by mouth once daily    Carvedilol 3.125 Mg Tabs (Carvedilol) ..... One by mouth bid    Cardura 4 Mg Tabs (Doxazosin mesylate) .Marland Kitchen... Take 1/2  tablet by mouth once a day  BP today: 100/60 Prior BP: 104/60 (11/13/2009)  Labs Reviewed: K+: 4.2 (12/16/2009) Creat: : 1.43 (12/16/2009)   Chol: 170 (02/05/2009)   HDL: 38.20 (02/05/2009)   LDL: 115 (02/05/2009)   TG: 82.0 (02/05/2009)  Problem # 3:  DIABETES, TYPE 2 (ICD-250.00) stable.  re.g  His updated  medication list for this problem includes:    Amaryl 1 Mg Tabs (Glimepiride) .Marland Kitchen..Marland Kitchen Two times a day    Benicar Hct 20-12.5 Mg Tabs (Olmesartan medoxomil-hctz) ..... One by mouth once daily    Actos 30 Mg Tabs (Pioglitazone hcl) .Marland Kitchen... Take 1 tablet by mouth once a day    Aspirin Low Dose 81 Mg Tabs (Aspirin) .Marland Kitchen... Take 1 tablet by mouth once a day  Labs Reviewed: Creat: 1.43 (12/16/2009)     Last Eye Exam: normal (03/01/2009) Reviewed HgBA1c results: 6.4 (09/11/2009)  6.6 (03/20/2009)  Problem # 4:  RHINOSINUSITIS, ACUTE (ICD-461.8) 2-3 weeks of sinus congestion with yellowish discharge.  use ceftin as directed.   nasal congestion worse since stopping decongestant.  add azelastine nose spray.  His updated medication list for this problem includes:    Cefuroxime Axetil 500 Mg Tabs (Cefuroxime axetil) ..... One by mouth bid1    Azelastine Hcl 137 Mcg/spray Soln (Azelastine hcl) .Marland Kitchen... 2  sprays two times a day as needed  Complete Medication List: 1)  Fexofenadine Hcl 180 Mg Tabs (Fexofenadine hcl) .... One by mouth qd 2)  Lorazepam 0.5 Mg Tabs (Lorazepam) .... One by mouth at bedtime prn 3)  Lipitor 40 Mg Tabs (Atorvastatin calcium) .... One by mouth once daily 4)  Amaryl 1 Mg Tabs (Glimepiride) .... Two times a day 5)  Prilosec 20 Mg Cpdr (Omeprazole) .... Two times a day 6)  Benicar Hct 20-12.5 Mg Tabs (Olmesartan medoxomil-hctz) .... One by mouth once daily 7)  Lovaza 1 Gm Caps (Omega-3-acid ethyl esters) .... 2 two times a day 8)  B-50 Tabs (Vitamins-lipotropics) .... Once daily 9)  Glucosamine-chondroitin 1500-1200 Mg/53ml Liqd (Glucosamine-chondroitin) .... Two times a day 10)  Actos 30 Mg Tabs (Pioglitazone hcl) .... Take 1 tablet by mouth once a day 11)  Carvedilol 3.125 Mg Tabs (Carvedilol) .... One by mouth bid 12)  Cardura 4 Mg Tabs (Doxazosin mesylate) .... Take 1/2  tablet by mouth once a day 13)  Ambien 5 Mg Tabs (Zolpidem tartrate) .... Take 1 tab by mouth at bedtime as needed 14)  Coq-10 150 Mg Caps (Coenzyme q10) .... Take 1 tablet by mouth once a day 15)  Cpap-sleep Apnea  16)  Aspirin Low Dose 81 Mg Tabs (Aspirin) .... Take 1 tablet by mouth once a day 17)  Gabapentin 100 Mg Caps (Gabapentin) .... One to two tabs by mouth q pm 18)  Halog 0.1 % Crea (Halcinonide) .... Use two times a day as needed 19)  Finasteride 5 Mg Tabs (Finasteride) .... One by mouth once daily 20)  Freestyle Lite Test Strp (Glucose blood) .... Use to test blood sugar three times a day dx code 250.00 21)  Nabumetone 750 Mg Tabs (Nabumetone) .... Take 1 tablet by mouth two times a day 22)  Cefuroxime Axetil 500 Mg Tabs (Cefuroxime axetil) .... One by mouth bid1 23)  Azelastine Hcl 137 Mcg/spray Soln (Azelastine hcl) .... 2 sprays two times a day as needed  Patient Instructions: 1)  Please schedule a follow-up appointment in 3 months. 2)  BMP prior to visit, ICD-9: 401.9 3)  FLP,  AST, ALT prior to visit, ICD-9: 272.4 4)  HbgA1C prior to visit, ICD-9: 250.00 5)  Please return for lab work one (1) week before your next appointment.  Prescriptions: AZELASTINE HCL 137 MCG/SPRAY SOLN (AZELASTINE HCL) 2 sprays two times a day as needed  #1 x 2   Entered and Authorized by:   D. Thomos Lemons DO  Signed by:   D. Thomos Lemons DO on 12/25/2009   Method used:   Electronically to        [facility] [phone]* (retail)       [address]       [place], [place]  [address]       Ph: [phone]       Fax: [phone]   RxID:   [id number] CEFUROXIME AXETIL 500 MG TABS (CEFUROXIME AXETIL) one by mouth bid1  #14 x 0   Entered and Authorized by:   D. Thomos Lemons DO   Signed by:   D. Thomos Lemons DO on 12/25/2009   Method used:   Electronically to        [facility] [phone]* (retail)       [address]       [place], [place]  [address]       Ph: [phone]       Fax: [phone]   RxID:   [id number] PRILOSEC 20 MG  CPDR (OMEPRAZOLE) two times a day  #90 x 3   Entered and Authorized by:   D. Thomos Lemons DO   Signed by:   D. Thomos Lemons DO on 12/25/2009   Method used:   Electronically to        [facility]* (mail-order)             ,          Ph: [phone]       Fax: [phone]   RxID:   [id number] ACTOS 30 MG TABS (PIOGLITAZONE HCL) Take 1 tablet by mouth once a day  #90 x 3   Entered and Authorized by:   D. Thomos Lemons DO   Signed by:   D. Thomos Lemons DO on 12/25/2009   Method used:   Electronically to        [facility]* (mail-order)             ,          Ph: [phone]       Fax: [phone]   RxID:   [id number] LOVAZA 1 GM  CAPS (OMEGA-3-ACID ETHYL ESTERS) 2 two times a day  #360 x 3   Entered and Authorized by:   D. Thomos Lemons DO   Signed by:   D. Thomos Lemons DO on 12/25/2009   Method used:   Electronically to        [facility]* (mail-order)             ,          Ph:  [phone]       Fax: [phone]   RxID:   [id number] AMARYL 1 MG  TABS (GLIMEPIRIDE) two times a day  #180 x 3   Entered and Authorized by:   D. Thomos Lemons DO   Signed by:   D. Thomos Lemons DO on 12/25/2009   Method used:   Electronically to        [facility]* (mail-order)             ,          Ph: [phone]       Fax: [phone]   RxID:   [id number] LIPITOR 40 MG TABS (ATORVASTATIN CALCIUM) one by mouth once daily  #90 x 3   Entered and Authorized by:  Dondra Spry DO   Signed by:   D. Thomos Lemons DO on 12/25/2009   Method used:   Electronically to        [facility]* (mail-order)             ,          Ph: [phone]       Fax: [phone]   RxID:   [id number] BENICAR HCT 20-12.5 MG TABS (OLMESARTAN MEDOXOMIL-HCTZ) one by mouth once daily  #90 x 3   Entered and Authorized by:   D. Thomos Lemons DO   Signed by:   D. Thomos Lemons DO on 12/25/2009   Method used:   Electronically to        [facility]* (mail-order)             ,          Ph: [phone]       Fax: [phone]   RxID:   [id number]   Current Allergies (reviewed today): ! * IVP DYE

## 2010-12-09 NOTE — Progress Notes (Signed)
Summary: Nasonex Refill  Phone Note Refill Request Message from:  Fax from Pharmacy on April 30, 2010 2:18 PM  Refills Requested: Medication #1:  NASONEX 50 MCG/ACT SUSP 2 sprays each nostril once daily   Dosage confirmed as above?Dosage Confirmed   Brand Name Necessary? No   Supply Requested: 3 months  Method Requested: Electronic Next Appointment Scheduled: none Initial call taken by: Glendell Docker CMA,  April 30, 2010 2:19 PM    Prescriptions: NASONEX 50 MCG/ACT SUSP (MOMETASONE FUROATE) 2 sprays each nostril once daily  #90 x 3   Entered by:   Glendell Docker CMA   Authorized by:   D. Thomos Lemons DO   Signed by:   Glendell Docker CMA on 04/30/2010   Method used:   Electronically to        [facility]* (retail)             ,          Ph: [phone]       Fax: [phone]   RxID:   [id number]

## 2010-12-09 NOTE — Progress Notes (Signed)
Summary: Finastride Refill  Phone Note Refill Request   Refills Requested: Medication #1:  FINASTERIDE 5 MG TABS one by mouth once daily   Dosage confirmed as above?Dosage Confirmed   Brand Name Necessary? No   Supply Requested: 6 months [address] [place] [phone]   Method Requested: Electronic Next Appointment Scheduled: 03-31-10 8 am lab  Initial call taken by: Roselle Locus,  February 24, 2010 10:35 AM  Follow-up for Phone Call        Rx completed in Dr. Tiajuana Amass Follow-up by: Glendell Docker CMA,  February 24, 2010 1:37 PM    Prescriptions: FINASTERIDE 5 MG TABS (FINASTERIDE) one by mouth once daily  #30 x 5   Entered by:   Glendell Docker CMA   Authorized by:   D. Thomos Lemons DO   Signed by:   Glendell Docker CMA on 02/24/2010   Method used:   Electronically to        [facility] [phone]* (retail)       [address]       [place], [place]  [address]       Ph: [phone]       Fax: [phone]   RxID:   [id number]

## 2010-12-09 NOTE — Progress Notes (Signed)
Summary: needs a med called in   Phone Note Refill Request   Refills Requested: Medication #1:  LORAZEPAM 0.5 MG TABS one by mouth at bedtime prn   Dosage confirmed as above?Dosage Confirmed   Brand Name Necessary? No needs a 90 day supply sent to [facility] ... Call pt. with any problems [phone]  Initial call taken by: Michaelle Copas,  January 14, 2010 11:55 AM  Follow-up for Phone Call        ok to refill lorazepam x 3 Follow-up by: D. Thomos Lemons DO,  January 14, 2010 1:14 PM  Additional Follow-up for Phone Call Additional follow up Details #1::        Rx called to pharmacist Gunnar Fusi Additional Follow-up by: Glendell Docker CMA,  January 14, 2010 2:23 PM    Prescriptions: LORAZEPAM 0.5 MG TABS (LORAZEPAM) one by mouth at bedtime prn  #90 x 0   Entered by:   Glendell Docker CMA   Authorized by:   D. Thomos Lemons DO   Signed by:   Glendell Docker CMA on 01/14/2010   Method used:   Telephoned to ...       [facility] [phone]* (retail)       [address]       [place], [place]  [address]       Ph: [phone]       Fax: [phone]   RxID:   [id number]

## 2010-12-09 NOTE — Progress Notes (Signed)
Summary: Was bladder okay too on the U/S?  Phone Note Outgoing Call   Summary of Call: call pt - kidney ultrasound is normal Initial call taken by: D. Thomos Lemons DO,  November 13, 2009 6:18 PM  Follow-up for Phone Call        informed pt. that Kidney Ultra Sound was normal. Pt. wants to know if that bladder was okay too?? Follow-up by: Michaelle Copas,  November 14, 2009 8:39 AM  Additional Follow-up for Phone Call Additional follow up Details #1::        yes bladder appeared normal Additional Follow-up by: D. Thomos Lemons DO,  November 14, 2009 1:22 PM    Additional Follow-up for Phone Call Additional follow up Details #2::    informed pt. bladder was normal also

## 2010-12-09 NOTE — Consult Note (Signed)
Summary: [facility]  [facility]   Imported By: Lanelle Bal 01/31/2010 09:52:17  _____________________________________________________________________  External Attachment:    Type:   Image     Comment:   External Document

## 2010-12-09 NOTE — Assessment & Plan Note (Signed)
Summary: 6 MONTHS ROV-CH   Vital Signs:  Patient profile:   70 year old male Weight:      236 pounds O2 Sat:      98 % on Room air Temp:     97.6 degrees F oral Pulse rate:   60 / minute Pulse rhythm:   regular Resp:     16 per minute BP sitting:   104 / 76  (right arm) Cuff size:   large  Vitals Entered By: Glendell Docker CMA (September 17, 2009 9:59 AM)  O2 Flow:  Room air  Primary Care Provider:  D. Thomos Lemons DO  CC:  6 month followup  and Type 2 diabetes mellitus follow-up.  History of Present Illness:  Type 2 Diabetes Mellitus Follow-Up      This is a 70 year old man who presents for Type 2 diabetes mellitus follow-up.  The patient denies self managed hypoglycemia, hypoglycemia requiring help, and weight gain.  The patient denies the following symptoms: chest pain.  Since the last visit the patient reports good dietary compliance and compliance with medications.    Htn - occasional lightheadedness     Allergies: 1)  ! 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    Physical Exam  General:  alert and overweight-appearing.   Neck:  No deformities, masses, or tenderness noted.no carotid bruits.   Lungs:  normal respiratory effort, normal breath sounds, and no wheezes.   Heart:  normal rate, regular rhythm, no murmur, and no gallop.   Abdomen:  soft, non-tender, normal bowel sounds, and no masses.    Extremities:  No lower extremity edema  Psych:  normally interactive and good eye contact.    Diabetes Management Exam:    Foot Exam (with socks and/or shoes not present):       Inspection:          Left foot: normal          Right foot: normal   Impression & Recommendations:  Problem # 1:  VARICOSE VEINS, LOWER EXTREMITIES (ICD-454.9) Pt complains of painful lower ext varicosities.  He has tried wearing compression stocking.  refer to vein specialist. [facility]: [facility] ([facility])  Problem # 2:  HYPERTENSION (ICD-401.9) BP slightly low.  decrease cardura to 2 mg.  His updated medication list for this problem includes:    Benicar Hct 40-25 Mg Tabs (Olmesartan medoxomil-hctz) .Marland Kitchen... 1/2 tab once daily    Carvedilol 3.125 Mg Tabs (Carvedilol) ..... One by mouth bid    Cardura 4 Mg Tabs (Doxazosin mesylate) .Marland Kitchen... Take 1/2  tablet by mouth once a day  BP today: 104/76 Prior BP: 100/60 (03/25/2009)  Labs Reviewed: K+: 4.7 (09/11/2009) Creat: : 1.59 (09/11/2009)   Chol: 170 (02/05/2009)   HDL: 38.20 (02/05/2009)   LDL: 115 (02/05/2009)   TG: 82.0 (02/05/2009)  Problem # 3:  BENIGN PROSTATIC HYPERTROPHY, WITH OBSTRUCTION (ICD-600.01)  Pt still having obstructive symptoms.   add finasteride.  His updated medication list for this problem includes:    Cardura 4 Mg Tabs (Doxazosin mesylate) .Marland Kitchen... Take 1/2  tablet by mouth once a day    Finasteride 5 Mg Tabs (Finasteride) ..... One by mouth once daily  Complete Medication List: 1)  Allegra-d 24 Hour 180-240 Mg Tb24 (Fexofenadine-pseudoephedrine) .... Once daily 2)  Lorazepam 0.5 Mg Tabs (Lorazepam) .... One by mouth at bedtime prn 3)  Lipitor 40 Mg Tabs (Atorvastatin calcium) .... One by mouth once daily 4)  Amaryl 1 Mg Tabs (Glimepiride) .... Two times a day 5)  Prilosec 20 Mg Cpdr (Omeprazole) .... Two times a day 6)  Benicar Hct 40-25 Mg Tabs (Olmesartan medoxomil-hctz) .... 1/2 tab once daily 7)  Lovaza 1 Gm Caps  (Omega-3-acid ethyl esters) .... 2 two times a day 8)  Calcium-vitamin D 500-125 Mg-unit Tabs (Calcium-vitamin d) .... 2 two times a day 9)  B-50 Tabs (Vitamins-lipotropics) .... Once daily 10)  Glucosamine-chondroitin 1500-1200 Mg/44ml Liqd (Glucosamine-chondroitin) .... Two times a day 11)  Actos 30 Mg Tabs (Pioglitazone hcl) .... Take 1 tablet by mouth once a day 12)  Carvedilol 3.125 Mg Tabs (Carvedilol) .... One by mouth bid 13)  Cardura 4 Mg Tabs (Doxazosin mesylate) .... Take 1/2  tablet by mouth once a day 14)  Ambien 5 Mg Tabs (Zolpidem tartrate) .... Take 1 tab by mouth at bedtime as needed 15)  Coq-10 150 Mg Caps (Coenzyme q10) .... Take 1 tablet by mouth once a day 16)  Cpap-sleep Apnea  17)  Aspirin Ec 325 Mg Tbec (Aspirin) .... Take 1 tablet by mouth once a day 18)  Gabapentin 100 Mg Caps (Gabapentin) .... One to two tabs by mouth q pm 19)  Halog 0.1 % Crea (Halcinonide) .... Use two times a day as needed 20)  Finasteride 5 Mg Tabs (Finasteride) .... One by mouth once daily  Patient Instructions: 1)  Please schedule a follow-up appointment in 6 weeks. 2)  BMP prior to visit, ICD-9:  401.9 3)  Please return for lab work one (1) week before your next appointment.  Prescriptions: FINASTERIDE 5 MG TABS (FINASTERIDE) one by mouth once daily  #30 x 5   Entered and Authorized by:   D. Thomos Lemons DO   Signed by:   D. Thomos Lemons DO on 09/17/2009   Method used:   Electronically to        [facility] [phone]* (retail)       [address]       [place], [place]  [address]       Ph: [phone]       Fax: [phone]   RxID:   [id number] HALOG 0.1 % CREA (HALCINONIDE) use two times a day as needed  #15 grams x 3   Entered and Authorized by:   D. Thomos Lemons DO   Signed by:   D. Thomos Lemons DO on 09/17/2009   Method used:   Electronically to        [facility] [phone]* (retail)       [address]       [place], [place]  [address]       Ph: [phone]       Fax: [phone]   RxID:   [id number]    Immunization History:  Tetanus/Td Immunization History:  Tetanus/Td:  tdap (07/01/2009)  Influenza Immunization History:    Influenza:  fluvax mcr (07/23/2009)  Pneumovax Immunization History:    Pneumovax:  pneumovax (07/23/2009)

## 2010-12-09 NOTE — Progress Notes (Signed)
Summary: Fexofenadine Mail Order  Phone Note Refill Request Message from:  Patient on December 26, 2009 11:40 AM  Refills Requested: Medication #1:  FEXOFENADINE HCL 180 MG TABS one by mouth qd   Dosage confirmed as above?Dosage Confirmed   Brand Name Necessary? No Please call into [facility] mail order. Pt. didn't realize he was almost out when he was here in the office yesterday. Thank you.Michaelle Copas  December 26, 2009 11:41 AM   Initial call taken by: Michaelle Copas,  December 26, 2009 11:41 AM  Follow-up for Phone Call        Rx completed in Dr. Tiajuana Amass Follow-up by: Glendell Docker CMA,  December 26, 2009 12:11 PM    Prescriptions: FEXOFENADINE HCL 180 MG TABS (FEXOFENADINE HCL) one by mouth qd  #90 x 3   Entered by:   Glendell Docker CMA   Authorized by:   D. Thomos Lemons DO   Signed by:   Glendell Docker CMA on 12/26/2009   Method used:   Electronically to        [facility]* (mail-order)             ,          Ph: [phone]       Fax: [phone]   RxID:   [id number]

## 2010-12-09 NOTE — Assessment & Plan Note (Signed)
Summary: 3 MONTH FOLLOW UP/MHF   Vital Signs:  Patient profile:   70 year old male Height:      69 inches Weight:      232.50 pounds BMI:     34.46 O2 Sat:      97 % on Room air Temp:     98.3 degrees F oral Pulse rate:   58 / minute Pulse rhythm:   regular Resp:     20 per minute BP sitting:   96 / 60  (right arm) Cuff size:   large  Vitals Entered By: Glendell Docker CMA (Apr 02, 2010 9:57 AM)  O2 Flow:  Room air CC: Rm 2- 3 Month Follow up, Type 2 diabetes mellitus follow-up Is Patient Diabetic? Yes Did you bring your meter with you today? No   Primary Care Provider:  Dondra Spry DO  CC:  Rm 2- 3 Month Follow up and Type 2 diabetes mellitus follow-up.  History of Present Illness:   Type 2 Diabetes Mellitus Follow-Up      This is a 70 year old man who presents for Type 2 diabetes mellitus follow-up.  The patient denies self managed hypoglycemia, hypoglycemia requiring help, and weight gain.  The patient denies the following symptoms: chest pain.  Since the last visit the patient reports good dietary compliance.  he also c/o  sinus congestion x 2 months productive cough, chronic nasal drainage he denies wheezing or shortness of breath  varicose veins - consultant notes reviewed  chronic insomnia - stable  Preventive Screening-Counseling & Management  Alcohol-Tobacco     Smoking Status: never  Allergies: 1)  ! * Ivp Dye  Past History:  Past Medical History: Allergic Rhinitis Coronary Heart Disease- mild at cath 2008  Diabetes, Type 2 Hyperlipidemia    Hypertension  Sleep Apnea- NPSG [place], 07/28/07 AHI 79.9  GERD  Elevated right hemidiaphragm of unclear etiology   Past Surgical History: Tonsillectomy Nissen fundoplication  sinus surgery 1996   heart cath 5/08       Family History: Coronary Heart Disease         Social History: Patient never smoked.  retired Holiday representative  Alcohol use-no Married 2nd marriage (13 yrs)  3 children           Review of Systems       The patient complains of prolonged cough.  The patient denies chest pain and dyspnea on exertion.    Physical Exam  General:  alert and overweight-appearing.   Ears:  R ear normal and L ear normal.   Nose:  mucosal erythema and mucosal edema.   Mouth:  postnasal drip.   Lungs:  normal respiratory effort, normal breath sounds, no crackles, and no wheezes.   Heart:  normal rate, regular rhythm, no murmur, and no gallop.   Extremities:  trace left pedal edema and trace right pedal edema.   Neurologic:  cranial nerves II-XII intact and gait normal.    Diabetes Management Exam:    Foot Exam (with socks and/or shoes not present):       Inspection:          Left foot: normal          Right foot: normal   Impression & Recommendations:  Problem # 1:  COUGH (ICD-786.2) pt with chronic sinus congestion and cough.  he has hx of sinus surgery in the past he reports symptoms for 2 months, not improved with abx and allegra rule out chronic sinusitis  asthma is also consideration  CT of sinuses shows - There is mucosal thickening in the ethmoid sinuses bilaterally.  The remainder of the sinuses are clear.  There is no air-fluid level.  There is no acute bony abnormality.  No soft tissue mass is present.   IMPRESSION: Chronic sinusitis.  No acute abnormality.  tx with doxy x 10 days.  Use nasonex and asteline nose sprays regularly.     Problem # 2:  DIABETES, TYPE 2 (ICD-250.00) stable.  Maintain current medication regimen.  His updated medication list for this problem includes:    Amaryl 1 Mg Tabs (Glimepiride) .Marland Kitchen..Marland Kitchen Two times a day    Benicar Hct 20-12.5 Mg Tabs (Olmesartan medoxomil-hctz) ..... One by mouth once daily    Actos 30 Mg Tabs (Pioglitazone hcl) .Marland Kitchen... Take 1 tablet by mouth once a day    Aspirin Low Dose 81 Mg Tabs (Aspirin) .Marland Kitchen... Take 1 tablet by mouth once a day  Labs Reviewed: Creat: 1.09 (03/31/2010)     Last Eye Exam: normal  (02/18/2010) Reviewed HgBA1c results: 6.4 (03/31/2010)  6.4 (09/11/2009)  Problem # 3:  VARICOSE VEINS, LOWER EXTREMITIES (ICD-454.9) seen by dr. Hart [place].  using compression stockings.  he is considering laser surgery  Complete Medication List: 1)  Fexofenadine Hcl 180 Mg Tabs (Fexofenadine hcl) .... One by mouth qd 2)  Lorazepam 0.5 Mg Tabs (Lorazepam) .... One by mouth at bedtime prn 3)  Lipitor 40 Mg Tabs (Atorvastatin calcium) .... One by mouth once daily 4)  Amaryl 1 Mg Tabs (Glimepiride) .... Two times a day 5)  Prilosec 20 Mg Cpdr (Omeprazole) .... Two times a day 6)  Benicar Hct 20-12.5 Mg Tabs (Olmesartan medoxomil-hctz) .... One by mouth once daily 7)  Lovaza 1 Gm Caps (Omega-3-acid ethyl esters) .... 2 two times a day 8)  B-50 Tabs (Vitamins-lipotropics) .... Once daily 9)  Glucosamine-chondroitin 1500-1200 Mg/26ml Liqd (Glucosamine-chondroitin) .... Two times a day 10)  Actos 30 Mg Tabs (Pioglitazone hcl) .... Take 1 tablet by mouth once a day 11)  Carvedilol 3.125 Mg Tabs (Carvedilol) .... One by mouth bid 12)  Cardura 4 Mg Tabs (Doxazosin mesylate) .... Take 1/2  tablet by mouth once a day 13)  Ambien 5 Mg Tabs (Zolpidem tartrate) .... Take 1 tab by mouth at bedtime as needed 14)  Coq-10 150 Mg Caps (Coenzyme q10) .... Take 1 tablet by mouth once a day 15)  Cpap-sleep Apnea  16)  Aspirin Low Dose 81 Mg Tabs (Aspirin) .... Take 1 tablet by mouth once a day 17)  Gabapentin 100 Mg Caps (Gabapentin) .... One to two tabs by mouth q pm 18)  Halog 0.1 % Crea (Halcinonide) .... Use two times a day as needed 19)  Finasteride 5 Mg Tabs (Finasteride) .... One by mouth once daily 20)  Freestyle Lite Test Strp (Glucose blood) .... Use to test blood sugar three times a day dx code 250.00 21)  Nabumetone 750 Mg Tabs (Nabumetone) .... Take 1 tablet by mouth two times a day 22)  Calcium-vitamin D 500-125 Mg-unit Tabs (Calcium-vitamin d) .... Take 1 tablet by mouth two times a day 23)   Nasonex 50 Mcg/act Susp (Mometasone furoate) .... 2 sprays each nostril once daily 24)  Azelastine Hcl 137 Mcg/spray Soln (Azelastine hcl) .... 2 sprays each nostril bid 25)  Doxycycline Hyclate 100 Mg Tabs (Doxycycline hyclate) .... One by mouth two times a day  Other Orders: CT without Contrast (CT w/o contrast)  Patient Instructions: 1)  Please schedule a follow-up appointment in 4 months. 2)  BMP prior to visit, ICD-9:   401.9 3)  HbgA1C prior to visit, ICD-9:  250.00 4)  Please return for lab work one (1) week before your next appointment.  Prescriptions: DOXYCYCLINE HYCLATE 100 MG TABS (DOXYCYCLINE HYCLATE) one by mouth two times a day  #20 x 0   Entered and Authorized by:   D. Thomos Lemons DO   Signed by:   D. Thomos Lemons DO on 04/03/2010   Method used:   Electron[facility] to        [facility] [phone][facility] (retail)       [address]       [place], [place]  [address]       Ph: [phone]       Fax: [phone]   RxID:   [id number] AZELASTINE HCL 137 MCG/SPRAY SOLN (AZELASTINE HCL) 2 sprays each nostril bid  #1 bottle x 3   Entered and Authorized by:   D. Thomos Lemons DO   Signed by:   D. Thomos Lemons DO on 04/03/2010   Method used:   Electronically to        [facility] [phone]* (retail)       [address]       [place], [place]  [address]       Ph: [phone]       Fax: [phone]   RxID:   [id number] NASONEX 50 MCG/ACT SUSP (MOMETASONE FUROATE) 2 sprays each nostril once daily  #1 x 3   Entered and Authorized by:   D. Thomos Lemons DO   Signed by:   D. Thomos Lemons DO on 04/03/2010   Method used:   Electronically to        [facility] [phone]* (retail)       [address]       [place], [place]  [address]       Ph: [phone]       Fax: [phone]   RxID:   [id number] AMBIEN 5 MG TABS (ZOLPIDEM TARTRATE) Take 1 tab by mouth at bedtime as needed  #90 x 1   Entered by:    Glendell Docker CMA   Authorized by:   D. Thomos Lemons DO   Signed by:   Glendell Docker CMA on 04/03/2010   Method used:   Printed then faxed to ...       [facility]* (mail-order)             ,          Ph: [phone]       Fax: [phone]   RxID:   [id number] LORAZEPAM 0.5 MG TABS (LORAZEPAM) one by mouth at bedtime prn  #90 x 1   Entered by:   Glendell Docker CMA   Authorized by:   D. Thomos Lemons DO   Signed by:   Glendell Docker CMA on 04/03/2010   Method used:   Printed then faxed to ...       [facility]* (mail-order)             ,          Ph: [phone]       Fax: [phone]   RxID:   [id number] FINASTERIDE 5 MG TABS (FINASTERIDE) one by mouth once daily  #  90 x 3   Entered by:   Glendell Docker CMA   Authorized by:   D. Thomos Lemons DO   Signed by:   D. Thomos Lemons DO on 04/02/2010   Method used:   Electronically to        [facility]* (mail-order)             ,          Ph: [phone]       Fax: [phone]   RxID:   [id number] GABAPENTIN 100 MG CAPS (GABAPENTIN) one to two tabs by mouth q pm  #180 x 3   Entered by:   Glendell Docker CMA   Authorized by:   D. Thomos Lemons DO   Signed by:   D. Thomos Lemons DO on 04/02/2010   Method used:   Electronically to        [facility]* (mail-order)             ,          Ph: [phone]       Fax: [phone]   RxID:   [id number] CARVEDILOL 3.125 MG TABS (CARVEDILOL) one by mouth bid  #180 x 3   Entered by:   Glendell Docker CMA   Authorized by:   D. Thomos Lemons DO   Signed by:   D. Thomos Lemons DO on 04/02/2010   Method used:   Electronically to        [facility]* (mail-order)             ,          Ph: [phone]       Fax: [phone]   RxID:   [id number] FEXOFENADINE HCL 180 MG TABS (FEXOFENADINE HCL) one by mouth qd  #90 x 3   Entered by:   Glendell Docker CMA   Authorized by:   D. Thomos Lemons DO   Signed by:   D. Thomos Lemons DO on 04/02/2010   Method used:   Electronically  to        [facility]* [facility])             ,          Ph: [phone]       Fax: [phone]   RxID:   [id number]   Current Allergies (reviewed today): ! * IVP DYE   Preventive Care Screening  Colonoscopy:    Date:  01/29/2009    Results:  normal

## 2010-12-09 NOTE — Assessment & Plan Note (Signed)
Summary: [facility]   Vital Signs:  Patient profile:   69 year old male Weight:      233.50 pounds BMI:     34.61 Temp:     97.7 degrees F oral Pulse rate:   88 / minute Pulse rhythm:   regular Resp:     16 per minute BP sitting:   100 / 60  (left arm) Cuff size:   large  Vitals Entered By: Glendell Docker CMA (Mar 25, 2009 9:25 AM)  Primary Care Provider:  Dondra Spry DO  CC:  Follow up disease management review of labs and Type 2 diabetes mellitus follow-up.  History of Present Illness: Type 2 Diabetes Mellitus Follow-Up      This is a 70 year old man who presents for Type 2 diabetes mellitus follow-up.  The patient denies polyuria, polydipsia, weight loss, and weight gain.  The patient denies the following symptoms: chest pain.  Since the last visit the patient reports good dietary compliance, compliance with medications, and exercising regularly.    Hypertension Follow-Up      The patient also presents for Hypertension follow-up.  The patient reports lightheadedness, but denies headaches and edema.  The patient denies the following associated symptoms: chest pain.  Compliance with medications (by patient report) has been near 100%.  The patient reports that dietary compliance has been fair.  The patient reports exercising occasionally.    Restless leg syndrome - very good response to gabapentin.    He can sleep thru the night.  Allergies: 1)  ! * Ivp Dye  Past History:  Past Medical History:    Allergic Rhinitis    Coronary Heart Disease- mild at cath 2008     Diabetes, Type 2    Hyperlipidemia     Hypertension    Sleep Apnea- NPSG [place], 07/28/07 AHI 79.9    GERD    Elevated right hemidiaphragm of unclear etiology   Past Surgical History:    Tonsillectomy    Nissen fundoplication    sinus surgery 1996    heart cath 5/08      Family History:    Coronary Heart Disease    Social History:    Patient never smoked.     retired [employer] representative    Alcohol use-no   Married 2nd marriage (13 yrs)     3 children     Review of Systems  The patient denies weight loss, weight gain, chest pain, syncope, and peripheral edema.    Physical Exam  General:  alert, well-developed, and well-nourished.   Neck:  No deformities, masses, or tenderness noted. Lungs:  normal respiratory effort, normal breath sounds, and no wheezes.   Heart:  normal rate, regular rhythm, no murmur, and no gallop.   Abdomen:  soft and non-tender.   Extremities:  No lower extremity edema   Diabetes Management Exam:    Foot Exam (with socks and/or shoes not present):       Inspection:          Left foot: normal          Right foot: normal   Impression & Recommendations:  Problem # 1:  RESTLESS LEG SYNDROME (ICD-333.94) Assessment Improved Significant improvement with gabapentin.   Maintain current medication regimen.  Problem # 2:  DIABETES, TYPE 2 (ICD-250.00) A1c stable.   Pt motivated to lose weight.   Pt to start exercise program.  His updated medication list for this problem includes:    Amaryl 1  Mg Tabs (Glimepiride) .Marland Kitchen..Marland Kitchen Two times a day    Benicar Hct 40-25 Mg Tabs (Olmesartan medoxomil-hctz) .Marland Kitchen... 1/2 tab once daily    Actos 30 Mg Tabs (Pioglitazone hcl) .Marland Kitchen... Take 1 tablet by mouth once a day    Aspirin Ec 325 Mg Tbec (Aspirin) .Marland Kitchen... Take 1 tablet by mouth once a day  Labs Reviewed: Creat: 1.5 (03/20/2009)     Last Eye Exam: normal (03/01/2009) Reviewed HgBA1c results: 6.6 (03/20/2009)  6.7 (02/05/2009)  Problem # 3:  HYPERTENSION (ICD-401.9) Well controlled.  He is still experiencing intermittent dizziness.   Reduce carvedilol dose.  His updated medication list for this problem includes:    Benicar Hct 40-25 Mg Tabs (Olmesartan medoxomil-hctz) .Marland Kitchen... 1/2 tab once daily    Carvedilol 3.125 Mg Tabs (Carvedilol) ..... One by mouth bid    Cardura 4 Mg Tabs (Doxazosin mesylate) .Marland Kitchen... Take 1 tablet by mouth once a day  BP today: 100/60 Prior BP: 108/68  (02/12/2009)  Labs Reviewed: K+: 4.2 (03/20/2009) Creat: : 1.5 (03/20/2009)   Chol: 170 (02/05/2009)   HDL: 38.20 (02/05/2009)   LDL: 115 (02/05/2009)   TG: 82.0 (02/05/2009)  Complete Medication List: 1)  Allegra-d 24 Hour 180-240 Mg Tb24 (Fexofenadine-pseudoephedrine) .... Once daily 2)  Lorazepam 0.5 Mg Tabs (Lorazepam) .... One by mouth at bedtime prn 3)  Lipitor 40 Mg Tabs (Atorvastatin calcium) .... One by mouth once daily 4)  Amaryl 1 Mg Tabs (Glimepiride) .... Two times a day 5)  Prilosec 20 Mg Cpdr (Omeprazole) .... Two times a day 6)  Benicar Hct 40-25 Mg Tabs (Olmesartan medoxomil-hctz) .... 1/2 tab once daily 7)  Lovaza 1 Gm Caps (Omega-3-acid ethyl esters) .... 2 two times a day 8)  Calcium-vitamin D 500-125 Mg-unit Tabs (Calcium-vitamin d) .... 2 two times a day 9)  B-50 Tabs (Vitamins-lipotropics) .... Once daily 10)  Glucosamine-chondroitin 1500-1200 Mg/71ml Liqd (Glucosamine-chondroitin) .... Two times a day 11)  Actos 30 Mg Tabs (Pioglitazone hcl) .... Take 1 tablet by mouth once a day 12)  Carvedilol 3.125 Mg Tabs (Carvedilol) .... One by mouth bid 13)  Cardura 4 Mg Tabs (Doxazosin mesylate) .... Take 1 tablet by mouth once a day 14)  Ambien 5 Mg Tabs (Zolpidem tartrate) .... Take 1 tab by mouth at bedtime as needed 15)  Coq-10 150 Mg Caps (Coenzyme q10) .... Take 1 tablet by mouth once a day 16)  Cpap-sleep Apnea  17)  Aspirin Ec 325 Mg Tbec (Aspirin) .... Take 1 tablet by mouth once a day 18)  Gabapentin 100 Mg Caps (Gabapentin) .... One to two tabs by mouth q pm  Patient Instructions: 1)  Please schedule a follow-up appointment in 2 months. 2)  Take 1/2 of Cardura dose x 2 weeks, then 1/2 every other day x 1 week,  then stop. 3)  Monitor your blood pressure.   4)  Call our office if your blood pressure greater than 140/90. 5)  Stop Alleve and any other NSAIDs 6)  BMP prior to visit, ICD-9:  401.9 Prescriptions: ACTOS 30 MG TABS (PIOGLITAZONE HCL) Take 1 tablet  by mouth once a day  #30 x 5   Entered by:   Glendell Docker CMA   Authorized by:   D. Thomos Lemons DO   Signed by:   Glendell Docker CMA on 03/25/2009   Method used:   Print then Give to Patient   RxID:   [id number] LORAZEPAM 0.5 MG TABS (LORAZEPAM) one by mouth at bedtime prn  #90 x  1   Entered and Authorized by:   D. Thomos Lemons DO   Signed by:   D. Thomos Lemons DO on 03/25/2009   Method used:   Print then Give to Patient   RxID:   [id number] GABAPENTIN 100 MG CAPS (GABAPENTIN) one to two tabs by mouth q pm  #180 x 3   Entered and Authorized by:   D. Thomos Lemons DO   Signed by:   D. Thomos Lemons DO on 03/25/2009   Method used:   Print then Give to Patient   RxID:   [id number] CARVEDILOL 3.125 MG TABS (CARVEDILOL) one by mouth bid  #180 x 3   Entered and Authorized by:   D. Thomos Lemons DO   Signed by:   D. Thomos Lemons DO on 03/25/2009   Method used:   Print then Give to Patient   RxID:   [id number]     Current Allergies (reviewed today): ! * IVP DYE

## 2010-12-09 NOTE — Progress Notes (Signed)
Summary: Lab Results  Phone Note Outgoing Call   Summary of Call: call pt - kidney function is slightly better.   Initial call taken by: D. Thomos Lemons DO,  November 15, 2009 4:53 PM  Follow-up for Phone Call        attempted to contact patient no answer. Message left with male to have patient return phone call Follow-up by: Glendell Docker CMA,  November 18, 2009 3:53 PM  Additional Follow-up for Phone Call Additional follow up Details #1::        Informed pt. that kidney function is slightly better. Pt. states that it is okay to give his wife results or leave on the answering machine if you are unable to get him by phone. Thank you.Michaelle Copas  November 18, 2009 3:56 PM

## 2010-12-09 NOTE — Miscellaneous (Signed)
Summary: Lab Orders  Clinical Lists Changes  Orders: Added new Test order of T-Basic Metabolic Panel [phone]) - Signed Added new Test order of T-Lipid Profile ([id number]) - Signed Added new Test order of TLB-ALT (SGPT) ([id number]-ALT) - Signed Added new Test order of TLB-AST (SGOT) ([id number]-[id number]) - Signed Added new Test order of T- Hemoglobin A1C ([id number]) - Signed

## 2010-12-09 NOTE — Progress Notes (Signed)
Summary: Allegra  Phone Note Refill Request Call back at [phone] Message from:  Patient  Refills Requested: Medication #1:  FEXOFENADINE HCL 180 MG TABS one by mouth qd Pt is currently in [place], tried to get [facility] to fill this Rx & they wouldn't, pls call him to advise  Initial call taken by: Lannette Donath,  April 28, 2010 9:49 AM  Follow-up for Phone Call        call returned to patient at [phone], patient states that he called [facility] about refilling his Allegra. [facility] advised patient that is is over the counter and they no longer dispense the medication. He would llike to know if Dr Artist Pais would provide a written rx and mail it to him to have filled in [place] at [address],[place] [address] Follow-up by: Glendell Docker CMA,  April 29, 2010 5:41 PM  Additional Follow-up for Phone Call Additional follow up Details #1::        rx printed and mailed to patient per Dr Artist Pais  okay Additional Follow-up by: Glendell Docker CMA,  April 30, 2010 10:45 AM    Prescriptions: FEXOFENADINE HCL 180 MG TABS (FEXOFENADINE HCL) one by mouth qd  #90 x 3   Entered by:   Glendell Docker CMA   Authorized by:   D. Thomos Lemons DO   Signed by:   Glendell Docker CMA on 04/30/2010   Method used:   Print then Mail to Patient   RxID:   [id number]

## 2010-12-09 NOTE — Assessment & Plan Note (Signed)
Summary: 6 week follow up/mhf   Vital Signs:  Patient profile:   70 year old male Weight:      238.25 pounds BMI:     35.31 O2 Sat:      98 % on Room air Temp:     97.6 degrees F oral Pulse rate:   80 / minute Pulse rhythm:   regular Resp:     18 per minute BP sitting:   104 / 60  (right arm) Cuff size:   large  Vitals Entered By: Glendell Docker CMA (November 13, 2009 10:11 AM)  O2 Flow:  Room air  Primary Care Provider:  D. Thomos Lemons DO  CC:  6 Week Follow up .  History of Present Illness: 6 Week follow up   70 year old white male for followup Still having obstructive urinary symptoms despite starting finasteride Sensation of incomplete bladder emptying Occasional dribbling, urinary frequency  We reviewed previous lab results. Creatinine is elevated Patient was taking over-the-counter NSAIDs but stopped after instructed to do so  Diabetes mellitus type 2- Low blood sugar 98 high 135 avg 100-115  Hypertension-less dizziness since decreasing Cardura dose to 2 mg  Preventive Screening-Counseling & Management  Alcohol-Tobacco     Smoking Status: never  Allergies: 1)  ! * Ivp Dye  Past History:  Past Medical History: Allergic Rhinitis Coronary Heart Disease- mild at cath 2008  Diabetes, Type 2 Hyperlipidemia   Hypertension  Sleep Apnea- NPSG [place], 07/28/07 AHI 79.9  GERD Elevated right hemidiaphragm of unclear etiology   Past Surgical History: Tonsillectomy Nissen fundoplication sinus surgery 1996  heart cath 5/08       Family History: Coronary Heart Disease       Social History: Patient never smoked.  retired Holiday representative Alcohol use-no Married 2nd marriage (13 yrs)  3 children        Review of Systems  The patient denies weight loss, weight gain, chest pain, and dyspnea on exertion.    Physical Exam  General:  alert and overweight-appearing.   Neck:  supple and no masses.   Lungs:  normal respiratory effort and normal breath  sounds.   Heart:  normal rate, regular rhythm, no murmur, and no gallop.   Abdomen:  soft, non-tender, and no masses.  no suprapubic fullness or tenderness Extremities:  No lower extremity edema  Psych:  normally interactive, good eye contact, not anxious appearing, and not depressed appearing.     Impression & Recommendations:  Problem # 1:  RENAL INSUFFICIENCY (ICD-88.41) 70 year old with mild renal insufficiency. I suspect pump and creatinine was from patient using over-the-counter NSAIDs. I doubt obstructive uropathy. Check renal ultrasound. Check SPEP and UPEP. If creatinine persistently elevated, consider renal consult. Orders: T-Basic Metabolic Panel [id number]) T- * Misc. Laboratory test [phone]) Ultrasound (Ultrasound)  Problem # 2:  HYPERTENSION (ICD-401.9) stable. Less dizziness with lower dose of Cardura. We may have to further decrease Benicar dose  His updated medication list for this problem includes:    Benicar Hct 40-25 Mg Tabs (Olmesartan medoxomil-hctz) .Marland Kitchen... 1/2 tab once daily    Carvedilol 3.125 Mg Tabs (Carvedilol) ..... One by mouth bid    Cardura 4 Mg Tabs (Doxazosin mesylate) .Marland Kitchen... Take 1/2  tablet by mouth once a day  BP today: 104/60 Prior BP: 104/76 (09/17/2009)  Labs Reviewed: K+: 4.7 (09/11/2009) Creat: : 1.59 (09/11/2009)   Chol: 170 (02/05/2009)   HDL: 38.20 (02/05/2009)   LDL: 115 (02/05/2009)   TG: 82.0 (02/05/2009)  Problem #  3:  BENIGN PROSTATIC HYPERTROPHY, WITH OBSTRUCTION (ICD-600.01) Assessment: Unchanged Patient advised to discontinue Allegra-D.  If urinary symptoms do not improve,  we discussed referral to urology. His updated medication list for this problem includes:    Cardura 4 Mg Tabs (Doxazosin mesylate) .Marland Kitchen... Take 1/2  tablet by mouth once a day    Finasteride 5 Mg Tabs (Finasteride) ..... One by mouth once daily  Complete Medication List: 1)  Fexofenadine Hcl 180 Mg Tabs (Fexofenadine hcl) .... One by mouth qd 2)  Lorazepam  0.5 Mg Tabs (Lorazepam) .... One by mouth at bedtime prn 3)  Lipitor 40 Mg Tabs (Atorvastatin calcium) .... One by mouth once daily 4)  Amaryl 1 Mg Tabs (Glimepiride) .... Two times a day 5)  Prilosec 20 Mg Cpdr (Omeprazole) .... Two times a day 6)  Benicar Hct 40-25 Mg Tabs (Olmesartan medoxomil-hctz) .... 1/2 tab once daily 7)  Lovaza 1 Gm Caps (Omega-3-acid ethyl esters) .... 2 two times a day 8)  B-50 Tabs (Vitamins-lipotropics) .... Once daily 9)  Glucosamine-chondroitin 1500-1200 Mg/46ml Liqd (Glucosamine-chondroitin) .... Two times a day 10)  Actos 30 Mg Tabs (Pioglitazone hcl) .... Take 1 tablet by mouth once a day 11)  Carvedilol 3.125 Mg Tabs (Carvedilol) .... One by mouth bid 12)  Cardura 4 Mg Tabs (Doxazosin mesylate) .... Take 1/2  tablet by mouth once a day 13)  Ambien 5 Mg Tabs (Zolpidem tartrate) .... Take 1 tab by mouth at bedtime as needed 14)  Coq-10 150 Mg Caps (Coenzyme q10) .... Take 1 tablet by mouth once a day 15)  Cpap-sleep Apnea  16)  Aspirin Low Dose 81 Mg Tabs (Aspirin) .... Take 1 tablet by mouth once a day 17)  Gabapentin 100 Mg Caps (Gabapentin) .... One to two tabs by mouth q pm 18)  Halog 0.1 % Crea (Halcinonide) .... Use two times a day as needed 19)  Finasteride 5 Mg Tabs (Finasteride) .... One by mouth once daily 20)  Freestyle Lite Test Strp (Glucose blood) .... Use to test blood sugar three times a day dx code 250.00  Patient Instructions: 1)  Stop Allegra D  2)  Please schedule a follow-up appointment in 1 month. Prescriptions: COQ-10 150 MG CAPS (COENZYME Q10) Take 1 tablet by mouth once a day  #90 x 3   Entered and Authorized by:   D. Thomos Lemons DO   Signed by:   D. Thomos Lemons DO on 11/13/2009   Method used:   Print then Give to Patient   RxID:   [id number] GLUCOSAMINE-CHONDROITIN 1500-1200 MG/30ML  LIQD (GLUCOSAMINE-CHONDROITIN) two times a day  #180 x 3   Entered and Authorized by:   D. Thomos Lemons DO   Signed by:   D. Thomos Lemons DO on  11/13/2009   Method used:   Print then Give to Patient   RxID:   [id number] B-50   TABS (VITAMINS-LIPOTROPICS) once daily  #90 x 3   Entered and Authorized by:   D. Thomos Lemons DO   Signed by:   D. Thomos Lemons DO on 11/13/2009   Method used:   Print then Give to Patient   RxID:   [id number] FEXOFENADINE HCL 180 MG TABS (FEXOFENADINE HCL) one by mouth qd  #90 x 3   Entered and Authorized by:   D. Thomos Lemons DO   Signed by:   D. Thomos Lemons DO on 11/13/2009   Method used:   Print then Give to Patient   RxID:  [id number]     Current Allergies (reviewed today): ! * IVP DYE

## 2010-12-09 NOTE — Letter (Signed)
Summary: [facility]   Imported By: Lanelle Bal 03/11/2010 13:59:35  _____________________________________________________________________  External Attachment:    Type:   Image     Comment:   External Document

## 2010-12-11 NOTE — Miscellaneous (Signed)
Summary: lab orders  Clinical Lists Changes  Orders: Added new Test order of T-Basic Metabolic Panel [id number] [phone]) - Signed Added new Test order of T- Hemoglobin A1C ([id number]) - Signed  Appended Document: lab orders    Clinical Lists Changes  Orders: Added new Test order of T-Lipid Profile [id number]) - Signed Added new Test order of T-PSA [phone]) - Signed

## 2010-12-11 NOTE — Assessment & Plan Note (Signed)
Summary: Follow up/mhf   Vital Signs:  Patient profile:   70 year old male Height:      69 inches Weight:      234.75 pounds BMI:     34.79 O2 Sat:      98 % on Room air Temp:     97.9 degrees F oral Pulse rate:   56 / minute Resp:     18 per minute BP sitting:   128 / 64  (right arm) Cuff size:   large  Vitals Entered By: Glendell Docker CMA (November 19, 2010 10:02 AM)  O2 Flow:  Room air CC: Follow up Is Patient Diabetic? Yes Pain Assessment Patient in pain? no      Comments states ekg done in [place], he has a cardiologist that he sees there, inconsistent with checking blood sugar   Primary Care Provider:  Dondra Spry DO  CC:  Follow up.  History of Present Illness:  70 y/o for f/u  after prolonged sitting bilateral knee discomfort area above knee and below knee is uncomfortable not worse with activity  occ palpitations (describes fluttering) usually at night last 10 days no trigger very little caffeine use no assoc dizziness, chest pain or SOB last saw cardiologist spring of 2011  also co leakage of urine / dribble of urine after       Preventive Screening-Counseling & Management  Alcohol-Tobacco     Alcohol drinks/day: 0     Smoking Status: never  Caffeine-Diet-Exercise     Caffeine use/day: 2 beverages daily     Does Patient Exercise: no  Allergies: 1)  ! * Ivp Dye  Past History:  Past Medical History: Allergic Rhinitis Coronary Heart Disease- mild at cath 2008  Diabetes, Type 2  Hyperlipidemia    Hypertension  Sleep Apnea- NPSG [place], 07/28/07 AHI 79.9  GERD   Elevated right hemidiaphragm of unclear etiology  Hypospadias  Past Surgical History: Tonsillectomy Nissen fundoplication  sinus surgery 1996   heart cath 5/08        Family History: Coronary Heart Disease          Social History: Patient never smoked.  retired Holiday representative  Alcohol use-no Married 2nd marriage (13 yrs)  3 children        Caffeine  use/day:  2 beverages daily Does Patient Exercise:  no  Review of Systems  The patient denies fever, weight loss, chest pain, syncope, prolonged cough, abdominal pain, melena, hematochezia, severe indigestion/heartburn, and depression.    Physical Exam  General:  alert and overweight-appearing.   Head:  normocephalic and atraumatic.   Ears:  R ear normal and L ear normal.   Mouth:  pharynx pink and moist.   Neck:  No deformities, masses, or tenderness noted.no carotid bruits.   Lungs:  normal respiratory effort, normal breath sounds, no crackles, and no wheezes.   Heart:  normal rate, regular rhythm, no murmur, and no gallop.   Abdomen:  soft, non-tender, normal bowel sounds, and no masses.   Rectal:  external hemorrhoid(s).   Genitalia:  circumcised and no scrotal masses.  hypospadias Prostate:  no gland enlargement, no nodules, and no asymmetry.   Msk:  bilateral knees: no joint tenderness, no joint swelling, no joint warmth, no redness over joints, and no joint instability.   Pulses:  dorsalis pedis and posterior tibial pulses are full and equal bilaterally Extremities:  No lower extremity edema  Neurologic:  cranial nerves II-XII intact and gait normal.  Impression & Recommendations:  Problem # 1:  PALPITATIONS (ICD-785.1) EKG shows bradycardia continue b blocker if symptoms persistent, arrange further cardiac work up His updated medication list for this problem includes:    Carvedilol 3.125 Mg Tabs (Carvedilol) ..... One by mouth bid  Orders: EKG w/ Interpretation (93000)  Problem # 2:  URINARY INCONTINENCE, PASSIVE, CONTINUOUS LEAKAGE (ICD-788.37) urinary leakage likely related to hypospadias pt to consider urology eval  Problem # 3:  HYPERTENSION (ICD-401.9) Assessment: Unchanged  His updated medication list for this problem includes:    Benicar Hct 20-12.5 Mg Tabs (Olmesartan medoxomil-hctz) ..... One by mouth once daily    Carvedilol 3.125 Mg Tabs  (Carvedilol) ..... One by mouth bid    Doxazosin Mesylate 1 Mg Tabs (Doxazosin mesylate) ..... One by mouth at bedtime  BP today: 128/64 Prior BP: 96/60 (04/02/2010)  Labs Reviewed: K+: 4.2 (11/17/2010) Creat: : 0.99 (11/17/2010)   Chol: 148 (11/17/2010)   HDL: 37 (11/17/2010)   LDL: 94 (11/17/2010)   TG: 87 (11/17/2010)  Problem # 4:  DIABETES, TYPE 2 (ICD-250.00) Assessment: Improved  The following medications were removed from the medication list:    Actos 30 Mg Tabs (Pioglitazone hcl) .Marland Kitchen... Take 1 tablet by mouth once a day His updated medication list for this problem includes:    Amaryl 1 Mg Tabs (Glimepiride) .Marland Kitchen..Marland Kitchen Two times a day    Benicar Hct 20-12.5 Mg Tabs (Olmesartan medoxomil-hctz) ..... One by mouth once daily    Aspirin Low Dose 81 Mg Tabs (Aspirin) .Marland Kitchen... Take 1 tablet by mouth once a day    Januvia 100 Mg Tabs (Sitagliptin phosphate) ..... One by mouth once daily  Labs Reviewed: Creat: 0.99 (11/17/2010)     Last Eye Exam: normal (02/18/2010) Reviewed HgBA1c results: 6.2 (11/17/2010)  6.4 (03/31/2010)  Complete Medication List: 1)  Fexofenadine Hcl 180 Mg Tabs (Fexofenadine hcl) .... One by mouth qd 2)  Lorazepam 0.5 Mg Tabs (Lorazepam) .... One by mouth at bedtime prn 3)  Lipitor 40 Mg Tabs (Atorvastatin calcium) .... One by mouth once daily 4)  Amaryl 1 Mg Tabs (Glimepiride) .... Two times a day 5)  Prilosec 20 Mg Cpdr (Omeprazole) .... Two times a day 6)  Benicar Hct 20-12.5 Mg Tabs (Olmesartan medoxomil-hctz) .... One by mouth once daily 7)  Lovaza 1 Gm Caps (Omega-3-acid ethyl esters) .... 2 two times a day 8)  B-50 Tabs (Vitamins-lipotropics) .... Once daily 9)  Glucosamine-chondroitin 1500-1200 Mg/28ml Liqd (Glucosamine-chondroitin) .... Two times a day 10)  Carvedilol 3.125 Mg Tabs (Carvedilol) .... One by mouth bid 11)  Doxazosin Mesylate 1 Mg Tabs (Doxazosin mesylate) .... One by mouth at bedtime 12)  Ambien 5 Mg Tabs (Zolpidem tartrate) .... Take 1  tab by mouth at bedtime as needed 13)  Coq-10 150 Mg Caps (Coenzyme q10) .... Take 1 tablet by mouth once a day 14)  Cpap-sleep Apnea  15)  Aspirin Low Dose 81 Mg Tabs (Aspirin) .... Take 1 tablet by mouth once a day 16)  Gabapentin 100 Mg Caps (Gabapentin) .... One to two tabs by mouth q pm 17)  Halog 0.1 % Crea (Halcinonide) .... Use two times a day as needed 18)  Finasteride 5 Mg Tabs (Finasteride) .... One by mouth once daily 19)  Freestyle Lite Test Strp (Glucose blood) .... Use to test blood sugar three times a day dx code 250.00 20)  Calcium-vitamin D 500-125 Mg-unit Tabs (Calcium-vitamin d) .... Take 1 tablet by mouth two times a day 21)  Nasonex 50 Mcg/act Susp (Mometasone furoate) .... 2 sprays each nostril once daily 22)  Azelastine Hcl 137 Mcg/spray Soln (Azelastine hcl) .... 2 sprays each nostril bid 23)  Januvia 100 Mg Tabs (Sitagliptin phosphate) .... One by mouth once daily  Patient Instructions: 1)  Please schedule a follow-up appointment in 3 months. 2)  BMP prior to visit, ICD-9:  250.00 3)  HbgA1C prior to visit, ICD-9: 250.00 4)  Please return for lab work one (1) week before your next appointment.  5)  Monitor your blood pressure at home.  Call our office if any signficant change 6)  Perform kegel exercises as directed 7)  Stop Actos Prescriptions: FINASTERIDE 5 MG TABS (FINASTERIDE) one by mouth once daily  #90 x 3   Entered and Authorized by:   D. Thomos Lemons DO   Signed by:   D. Thomos Lemons DO on 11/19/2010   Method used:   Electronically to        [facility]* (retail)             ,          Ph: [phone]       Fax: [phone]   RxID:   [id number] CARVEDILOL 3.125 MG TABS (CARVEDILOL) one by mouth bid  #180 x 3   Entered and Authorized by:   D. Thomos Lemons DO   Signed by:   D. Thomos Lemons DO on 11/19/2010   Method used:   Electronically to        [facility]* (retail)             ,          Ph: [phone]       Fax: [phone]   RxID:    [id number] LOVAZA 1 GM  CAPS (OMEGA-3-ACID ETHYL ESTERS) 2 two times a day  #360 x 3   Entered and Authorized by:   D. Thomos Lemons DO   Signed by:   D. Thomos Lemons DO on 11/19/2010   Method used:   Electronically to        [facility]* (retail)             ,          Ph: [phone]       Fax: [phone]   RxID:   [id number] PRILOSEC 20 MG  CPDR (OMEPRAZOLE) two times a day  #90 x 3   Entered and Authorized by:   D. Thomos Lemons DO   Signed by:   D. Thomos Lemons DO on 11/19/2010   Method used:   Electronically to        [facility]* (retail)             ,          Ph: [phone]       Fax: [phone]   RxID:   [id number] AMARYL 1 MG  TABS (GLIMEPIRIDE) two times a day  #180 x 3   Entered and Authorized by:   D. Thomos Lemons DO   Signed by:   D. Thomos Lemons DO on 11/19/2010   Method used:   Electronically to        [facility]* (retail)             ,          Ph: [phone]       Fax: [phone]   RxID:   [id number] JANUVIA 100 MG TABS (SITAGLIPTIN PHOSPHATE) one  by mouth once daily  #90 x 1   Entered and Authorized by:   D. Thomos Lemons DO   Signed by:   D. Thomos Lemons DO on 11/19/2010   Method used:   Print then Give to Patient   RxID:   [id number] DOXAZOSIN MESYLATE 1 MG TABS (DOXAZOSIN MESYLATE) one by mouth at bedtime  #90 x 1   Entered and Authorized by:   D. Thomos Lemons DO   Signed by:   D. Thomos Lemons DO on 11/19/2010   Method used:   Print then Give to Patient   RxID:   [id number] LORAZEPAM 0.5 MG TABS (LORAZEPAM) one by mouth at bedtime prn  #90 x 1   Entered and Authorized by:   D. Thomos Lemons DO   Signed by:   D. Thomos Lemons DO on 11/19/2010   Method used:   Print then Give to Patient   RxID:   [id number] AMBIEN 5 MG TABS (ZOLPIDEM TARTRATE) Take 1 tab by mouth at bedtime as needed  #90 x 1   Entered and Authorized by:   D. Thomos Lemons DO   Signed by:   D. Thomos Lemons DO on 11/19/2010   Method used:   Print then Give to  Patient   RxID:   [id number] BENICAR HCT 20-12.5 MG TABS (OLMESARTAN MEDOXOMIL-HCTZ) one by mouth once daily  #90 x 3   Entered by:   Glendell Docker CMA   Authorized by:   D. Thomos Lemons DO   Signed by:   D. Thomos Lemons DO on 11/19/2010   Method used:   Electronically to        [facility]* (retail)             ,          Ph: [phone]       Fax: [phone]   RxID:   [id number] LIPITOR 40 MG TABS (ATORVASTATIN CALCIUM) one by mouth once daily  #90 x 3   Entered by:   Glendell Docker CMA   Authorized by:   D. Thomos Lemons DO   Signed by:   D. Thomos Lemons DO on 11/19/2010   Method used:   Electronically to        [facility]* (retail)             ,          Ph: [phone]       Fax: [phone]   RxID:   [id number]    Orders Added: 1)  EKG w/ Interpretation [93000] 2)  Est. Patient Level IV [03500]   Immunization History:  Influenza Immunization History:    Influenza:  historical (08/19/2010)   Immunization History:  Influenza Immunization History:    Influenza:  Historical (08/19/2010)  Current Allergies (reviewed today): ! * IVP DYE

## 2011-02-09 ENCOUNTER — Other Ambulatory Visit: Payer: Self-pay | Admitting: Internal Medicine

## 2011-02-09 ENCOUNTER — Telehealth: Payer: Self-pay | Admitting: *Deleted

## 2011-02-09 DIAGNOSIS — E119 Type 2 diabetes mellitus without complications: Secondary | ICD-10-CM

## 2011-02-09 LAB — HEMOGLOBIN A1C
Hgb A1c MFr Bld: 6.1 % — ABNORMAL HIGH (ref <5.7)
Mean Plasma Glucose: 128 mg/dL — ABNORMAL HIGH (ref <117)

## 2011-02-09 NOTE — Telephone Encounter (Signed)
Received call from Katrina stating that pt is in the lab and she needs lab order. Orders entered and faxed to the lab.

## 2011-02-10 LAB — BASIC METABOLIC PANEL WITH GFR
BUN: 22 mg/dL (ref 6–23)
CO2: 24 mEq/L (ref 19–32)
Calcium: 9.2 mg/dL (ref 8.4–10.5)
Chloride: 107 mEq/L (ref 96–112)
Creat: 1.21 mg/dL (ref 0.40–1.50)
GFR, Est African American: >60 mL/min (ref >60)
GFR, Est Non African American: 59 mL/min — ABNORMAL LOW (ref >60)
Glucose, Bld: 120 mg/dL — ABNORMAL HIGH (ref 70–99)
Potassium: 4.2 mEq/L (ref 3.5–5.3)
Sodium: 141 mEq/L (ref 135–145)

## 2011-02-11 ENCOUNTER — Encounter: Payer: Self-pay | Admitting: Internal Medicine

## 2011-02-11 ENCOUNTER — Ambulatory Visit (INDEPENDENT_AMBULATORY_CARE_PROVIDER_SITE_OTHER): Payer: Medicare Other | Admitting: Internal Medicine

## 2011-02-11 DIAGNOSIS — E785 Hyperlipidemia, unspecified: Secondary | ICD-10-CM

## 2011-02-11 DIAGNOSIS — E119 Type 2 diabetes mellitus without complications: Secondary | ICD-10-CM

## 2011-02-11 DIAGNOSIS — I10 Essential (primary) hypertension: Secondary | ICD-10-CM

## 2011-02-11 DIAGNOSIS — G4730 Sleep apnea, unspecified: Secondary | ICD-10-CM

## 2011-02-11 LAB — CK: Total CK: 115 U/L (ref 7–232)

## 2011-02-11 MED ORDER — DOXAZOSIN MESYLATE 1 MG PO TABS: 1.0000 mg | ORAL_TABLET | Freq: Every day | ORAL | Status: DC

## 2011-02-11 MED ORDER — OMEPRAZOLE 20 MG PO CPDR: 20.0000 mg | DELAYED_RELEASE_CAPSULE | Freq: Two times a day (BID) | ORAL | Status: DC

## 2011-02-11 MED ORDER — SITAGLIPTIN PHOSPHATE 100 MG PO TABS: 100.0000 mg | ORAL_TABLET | Freq: Every day | ORAL | Status: DC

## 2011-02-11 MED ORDER — ZOLPIDEM TARTRATE ER 6.25 MG PO TBCR: 6.2500 mg | EXTENDED_RELEASE_TABLET | Freq: Every evening | ORAL | Status: DC | PRN

## 2011-02-11 MED ORDER — ATORVASTATIN CALCIUM 20 MG PO TABS: 20.0000 mg | ORAL_TABLET | Freq: Every day | ORAL | Status: DC

## 2011-02-11 NOTE — Assessment & Plan Note (Signed)
Well controlled.   Continue current medication regimen. Lab Results  Component Value Date   HGBA1C 6.1* 02/09/2011   Pt counseled on diet and exercise. Goal wt loss 20-30 lbs

## 2011-02-11 NOTE — Progress Notes (Signed)
Subjective:    Patient ID: Ian [name], male    DOB: 1940-12-03, 70 y.o.   MRN: [id number]  HPI 70 y/o male with hx of DM II, obesity,  OSA, hyperlipidemia and CAD for routine f/u. DMII - stable.  No change in weight.  Pt c/o chronic fatigue.   He also c/o bilateral knee stiffness.  Mainly above knee caps. Worse after prolonged sitting  Htn - pt was experiencing mild dizziness with standing.  Better since change benicar/hctz to benicar.  Planning to go back to [place] for summer in May.  He is more active when he goes back to [place].  CRI - continues to avoid NSAIDs  Review of Systems  Respiratory: Negative for shortness of breath.   Cardiovascular: Negative for chest pain.       Past Medical History  Diagnosis Date  . Allergy   . Diabetes mellitus     type II  . Hyperlipidemia   . Hypertension   . Apnea, sleep 07/28/07    NPSG [place], AHI 79.9  . GERD (gastroesophageal reflux disease)   . Hypospadias   . Coronary heart disease 2008    mild at cath    History   Social History  . Marital Status: Married    Spouse Name: N/A    Number of Children: 3  . Years of Education: N/A   Occupational History  . Retired Holiday representative    Social History Main Topics  . Smoking status: Never Smoker   . Smokeless tobacco: Not on file  . Alcohol Use: No  . Drug Use:   . Sexually Active:    Other Topics Concern  . Not on file   Social History Narrative  . No narrative on file    Past Surgical History  Procedure Date  . Tonsillectomy   . Nissen fundoplication   . Cardiac catheterization 03/2007    Family History  Problem Relation Age of Onset  . Coronary artery disease      Allergies  Allergen Reactions  . Iohexol      Desc: pt. states severe reaction, dr. told patient never to have the dye again     Current Outpatient Prescriptions on File Prior to Visit  Medication Sig Dispense Refill  . aspirin 81 MG tablet Take 81 mg by mouth daily.        Marland Kitchen  atorvastatin (LIPITOR) 40 MG tablet Take 40 mg by mouth daily.        Marland Kitchen azelastine (ASTELIN) 137 MCG/SPRAY nasal spray 2 sprays by Nasal route 2 (two) times daily. Use in each nostril as directed       . calcium-vitamin D 500 MG tablet Take 1 tablet by mouth 2 (two) times daily.        . carvedilol (COREG) 3.125 MG tablet Take 3.125 mg by mouth 2 (two) times daily with meals.        . Coenzyme Q10 (COQ10) 150 MG CAPS Take 1 capsule by mouth daily.        Marland Kitchen doxazosin (CARDURA) 1 MG tablet Take 1 mg by mouth at bedtime.        . fexofenadine (ALLEGRA) 180 MG tablet Take 180 mg by mouth daily.        . finasteride (PROSCAR) 5 MG tablet Take 5 mg by mouth daily.        Marland Kitchen gabapentin (NEURONTIN) 100 MG capsule Take 100 mg by mouth. Take 1-2 tablets by mouth every evening.       Marland Kitchen  glimepiride (AMARYL) 1 MG tablet Take 1 mg by mouth 2 (two) times daily.        . Glucosamine-Chondroitin 1500-1200 MG/30ML LIQD Take by mouth. Twice a day       . glucose blood (FREESTYLE LITE) test strip 1 each by Other route as needed. Use to test blood sugar three times a day as instructed.  Dx. 250.00       . halcinonide (HALOG) 0.1 % external solution Apply topically 2 (two) times daily.        Marland Kitchen LORazepam (ATIVAN) 0.5 MG tablet Take 0.5 mg by mouth at bedtime as needed.        . mometasone (NASONEX) 50 MCG/ACT nasal spray 2 sprays by Nasal route daily.        Marland Kitchen omega-3 acid ethyl esters (LOVAZA) 1 GM capsule Take 2 g by mouth 2 (two) times daily.        Marland Kitchen omeprazole (PRILOSEC) 20 MG capsule Take 20 mg by mouth 2 (two) times daily.        . sitaGLIPtan (JANUVIA) 100 MG tablet Take 100 mg by mouth daily.        . Vitamins-Lipotropics (B-50) TABS Take 1 tablet by mouth daily.        Marland Kitchen zolpidem (AMBIEN) 5 MG tablet Take 5 mg by mouth at bedtime as needed.        Marland Kitchen olmesartan-hydrochlorothiazide (BENICAR HCT) 20-12.5 MG per tablet Take 1 tablet by mouth daily.         BP 118/64  Pulse 78  Temp(Src) 97.9 F (36.6 C)  (Oral)  Resp 20  Ht 5\' 9"  (1.753 m)  Wt 235 lb (106.595 kg)  BMI 34.70 kg/m2  SpO2 96%    Objective:   Physical Exam  Constitutional: He is oriented to person, place, and time. He appears well-developed and well-nourished. No distress.  HENT:  Head: Normocephalic and atraumatic.  Neck: Normal range of motion. Neck supple.  Cardiovascular: Normal rate, regular rhythm and normal heart sounds.   Pulmonary/Chest: Breath sounds normal. He has no wheezes. He has no rales.  Musculoskeletal: Normal range of motion. He exhibits no edema.  Neurological: He is oriented to person, place, and time.  Psychiatric: He has a normal mood and affect. His behavior is normal.          Assessment & Plan:

## 2011-02-11 NOTE — Patient Instructions (Signed)
Please complete the following blood work 2-3 before your next follow up appointment BMET , A1c,  Microalb/Cr ratio - 401.9, 250.00 FLP, LFTs - 272.4

## 2011-02-11 NOTE — Assessment & Plan Note (Signed)
Pt c/o chronic fatigue Pt not able to tolerate CPAP.  He is using oral appliance  Goal wt loss 20-30 lbs.  Wt loss strategies discussed

## 2011-02-11 NOTE — Assessment & Plan Note (Signed)
Unclear if soreness / stiffness of lower ext related to statin use. Decrease lipitor to 20 mg

## 2011-02-11 NOTE — Assessment & Plan Note (Signed)
Pt was experiencing orthostatic dizziness Symptoms improved with change benicar hctz to benicar 20 mg 1/2 BP is at goal Cr is stable He is avoiding NSAIDs as directed Lab Results  Component Value Date   CREATININE 1.21 02/09/2011

## 2011-02-17 ENCOUNTER — Ambulatory Visit (INDEPENDENT_AMBULATORY_CARE_PROVIDER_SITE_OTHER): Payer: Medicare Other | Admitting: Vascular Surgery

## 2011-02-17 ENCOUNTER — Encounter: Payer: Self-pay | Admitting: Family

## 2011-02-17 ENCOUNTER — Ambulatory Visit (INDEPENDENT_AMBULATORY_CARE_PROVIDER_SITE_OTHER): Payer: Medicare Other | Admitting: Family

## 2011-02-17 VITALS — BP 124/74 | HR 66 | Temp 97.7°F | Resp 18 | Ht 69.0 in | Wt 233.0 lb

## 2011-02-17 DIAGNOSIS — R062 Wheezing: Secondary | ICD-10-CM

## 2011-02-17 DIAGNOSIS — J40 Bronchitis, not specified as acute or chronic: Secondary | ICD-10-CM

## 2011-02-17 DIAGNOSIS — J209 Acute bronchitis, unspecified: Secondary | ICD-10-CM

## 2011-02-17 DIAGNOSIS — I83893 Varicose veins of bilateral lower extremities with other complications: Secondary | ICD-10-CM

## 2011-02-17 MED ORDER — AZELASTINE HCL 0.1 % NA SOLN: 2.0000 | Freq: Two times a day (BID) | NASAL | Status: DC

## 2011-02-17 MED ORDER — AZITHROMYCIN 250 MG PO TABS: ORAL_TABLET | ORAL | Status: AC

## 2011-02-17 NOTE — Patient Instructions (Addendum)
Please call if your symptoms worsen, or if they are not improved in the next 48-72 hours.  

## 2011-02-17 NOTE — Progress Notes (Signed)
Subjective:    Patient ID: Ian [name], male    DOB: Jun 14, 1941, 70 y.o.   MRN: [id number]  HPI  Ian [name] is a 70 year old male who presents today with chief complaint of cough.  He notes that cough started 2-3 days ago.  He is taking allegra without improvement.  Cough is dry and hacking in nature.  Difficulty sleeping due to the cough.      Review of Systems  Constitutional: Negative for fever and chills.  Respiratory: Positive for cough, chest tightness and shortness of breath. Negative for wheezing.   Cardiovascular: Negative for leg swelling.   Past Medical History  Diagnosis Date  . Allergy   . Diabetes mellitus     type II  . Hyperlipidemia   . Hypertension   . Apnea, sleep 07/28/07    [facility] [place], AHI 79.9  . GERD (gastroesophageal reflux disease)   . Hypospadias   . Coronary heart disease 2008    mild at cath    History   Social History  . Marital Status: Married    Spouse Name: N/A    Number of Children: 3  . Years of Education: N/A   Occupational History  . Retired Holiday representative    Social History Main Topics  . Smoking status: Never Smoker   . Smokeless tobacco: Not on file  . Alcohol Use: No  . Drug Use:   . Sexually Active:    Other Topics Concern  . Not on file   Social History Narrative  . No narrative on file    Past Surgical History  Procedure Date  . Tonsillectomy   . Nissen fundoplication   . Cardiac catheterization 03/2007    Family History  Problem Relation Age of Onset  . Coronary artery disease      Allergies  Allergen Reactions  . Iohexol      Desc: pt. states severe reaction, dr. told patient never to have the dye again     Current Outpatient Prescriptions on File Prior to Visit  Medication Sig Dispense Refill  . aspirin 81 MG tablet Take 81 mg by mouth daily.        Marland Kitchen atorvastatin (LIPITOR) 20 MG tablet Take 1 tablet (20 mg total) by mouth daily.  90 tablet  3  . calcium-vitamin D 500 MG tablet Take 1 tablet  by mouth 2 (two) times daily.        . carvedilol (COREG) 3.125 MG tablet Take 3.125 mg by mouth 2 (two) times daily with meals.        . Coenzyme Q10 (COQ10) 150 MG CAPS Take 1 capsule by mouth daily.        Marland Kitchen doxazosin (CARDURA) 1 MG tablet Take 1 tablet (1 mg total) by mouth at bedtime.  90 tablet  1  . fexofenadine (ALLEGRA) 180 MG tablet Take 180 mg by mouth daily.        . finasteride (PROSCAR) 5 MG tablet Take 5 mg by mouth daily.        Marland Kitchen gabapentin (NEURONTIN) 100 MG capsule Take 100 mg by mouth. Take 1-2 tablets by mouth every evening.       Marland Kitchen glimepiride (AMARYL) 1 MG tablet Take 1 mg by mouth 2 (two) times daily.        . Glucosamine-Chondroitin 1500-1200 MG/30ML LIQD Take by mouth. Twice a day       . glucose blood (FREESTYLE LITE) test strip 1 each by Other route as  needed. Use to test blood sugar three times a day as instructed.  Dx. 250.00       . halcinonide (HALOG) 0.1 % external solution Apply topically 2 (two) times daily.        Marland Kitchen LORazepam (ATIVAN) 0.5 MG tablet Take 0.5 mg by mouth at bedtime as needed.        . mometasone (NASONEX) 50 MCG/ACT nasal spray 2 sprays by Nasal route daily.        Marland Kitchen olmesartan (BENICAR) 20 MG tablet Take 20 mg by mouth daily. 1/2 tablet by mouth once daily      . omega-3 acid ethyl esters (LOVAZA) 1 GM capsule Take 2 g by mouth 2 (two) times daily.        Marland Kitchen omeprazole (PRILOSEC) 20 MG capsule Take 1 capsule (20 mg total) by mouth 2 (two) times daily.  180 capsule  1  . sitaGLIPtan (JANUVIA) 100 MG tablet Take 1 tablet (100 mg total) by mouth daily.  90 tablet  1  . Vitamins-Lipotropics (B-50) TABS Take 1 tablet by mouth daily.        Marland Kitchen zolpidem (AMBIEN CR) 6.25 MG CR tablet Take 1 tablet (6.25 mg total) by mouth at bedtime as needed for sleep.  90 tablet  1    BP 124/74  Pulse 66  Temp(Src) 97.7 F (36.5 C) (Oral)  Resp 18  Ht 5\' 9"  (1.753 m)  Wt 233 lb 0.6 oz (105.706 kg)  BMI 34.41 kg/m2  SpO2 98%        Objective:   Physical  Exam  Constitutional: He appears well-developed and well-nourished.  Neck: Neck supple.  Cardiovascular: Normal rate and regular rhythm.   Pulmonary/Chest: Effort normal and breath sounds normal.       Very soft expiratory wheeze noted.  Good air movement to bases, no increased work of breathing.           Assessment & Plan:  advair 250/50 lot [id number] exp 02/2012 nasonex 2 MAA 09 Nov 2012

## 2011-02-18 DIAGNOSIS — J209 Acute bronchitis, unspecified: Secondary | ICD-10-CM | POA: Insufficient documentation

## 2011-02-18 LAB — HM DIABETES FOOT EXAM

## 2011-02-18 NOTE — Assessment & Plan Note (Addendum)
Suspect bronchitis with mild associated bronchospasm.  Will treat with zithromax and Advair (sample provided to patient) F/u in 2 weeks.  At that time it can be decided if he will need to continue the Advair.

## 2011-02-18 NOTE — Assessment & Plan Note (Signed)
OFFICE VISIT  [name], Ian H DOB:  [birth year]                                       02/17/2011 [id number]#:[id number]  The patient returns for 65-month follow-up regarding his laser ablation of the right great saphenous vein performed December 5 for painful varicosities in the right distal thigh and lateral calf area.  He states that he continues to have some burning, aching and throbbing discomfort in the varicosities in the distal thigh and down into the pretibial region with no distal edema.  He has been wearing his long-leg elastic compression stocking with no help.  He did not have stab phlebectomies performed at his initial procedure, only laser ablation.  He has tried elevation as well as ibuprofen as well.  On examination today, his blood pressure 152/78, heart rate 67, respirations 18.  He has some bulging varicosities beginning in the mid thigh, extending lateral to the knee into the pretibial region, which is where his symptoms are present.  He has no stasis ulcers or evidence of bleeding.  He has 3+ dorsalis pedis pulse palpable in the right leg.  Today I performed a bedside Sono-Site ultrasound study, which revealed the right great saphenous vein to be occluded in the mid and distal thigh.  There is a significant anterior accessory branch of the right great saphenous vein which seems to be in communication with these bulging varicosities and may be the culprit.  There is no DVT noted.  I will obtain a formal venous duplex exam to see if there is significant reflux in the anterior accessory branch of the right great saphenous vein and if so, then laser ablation of this should be performed be followed by stab phlebectomies in the future.  If there is no reflux in the anterior accessory branch, then I think we should proceed with stab phlebectomies as his next procedure.  We will schedule that in the near future to determine his treatment  options.    Ian [name], M.D. Electronically Signed  JDL/MEDQ  D:  02/17/2011  T:  02/18/2011  Job:  [id number]

## 2011-02-19 ENCOUNTER — Encounter (INDEPENDENT_AMBULATORY_CARE_PROVIDER_SITE_OTHER): Payer: Medicare Other

## 2011-02-19 DIAGNOSIS — I83893 Varicose veins of bilateral lower extremities with other complications: Secondary | ICD-10-CM

## 2011-02-19 DIAGNOSIS — Z48812 Encounter for surgical aftercare following surgery on the circulatory system: Secondary | ICD-10-CM

## 2011-02-24 NOTE — Procedures (Unsigned)
DUPLEX DEEP VENOUS EXAM - LOWER EXTREMITY  INDICATION:  Right greater saphenous vein laser ablation.  HISTORY:  Edema:  No. Trauma/Surgery:  Right greater saphenous vein laser ablation on 10/13/2010. Pain:  Right anterior shin pain at varicose vein area. PE:  No. Previous DVT:  No. Anticoagulants: Other:  DUPLEX EXAM:               CFV   SFV   PopV  PTV    GSV               R  L  R  L  R  L  R   L  R  L Thrombosis    o  o  o     o     o      + Spontaneous   +  +  +     +     +      o Phasic        +  +  +     +     +      o Augmentation  +  +  +     +     +      o Compressible  +  +  +     +     +      o Competent     o  o  +     o     +      o  Legend:  + - yes  o - no  p - partial  D - decreased  IMPRESSION: 1. No evidence of deep venous thrombosis noted in the right lower     extremity. 2. The right greater saphenous vein appears totally occluded from the     distal insertion site to near the anterior accessory saphenous vein     level. 3. No clinically significant reflux noted throughout the thigh level,     right anterior accessory saphenous vein.  Reflux of >500     milliseconds noted at the knee level of a varicose vein branch that     communicates with the anterior accessory vein. 4. Reflux of >500 milliseconds noted in the bilateral common femoral     and right popliteal veins.   _____________________________ [name], M.D.  CH/MEDQ  D:  02/19/2011  T:  02/19/2011  Job:  [id number]

## 2011-02-26 ENCOUNTER — Encounter: Payer: Self-pay | Admitting: Internal Medicine

## 2011-03-02 ENCOUNTER — Encounter (INDEPENDENT_AMBULATORY_CARE_PROVIDER_SITE_OTHER): Payer: Medicare Other | Admitting: Vascular Surgery

## 2011-03-02 DIAGNOSIS — I83893 Varicose veins of bilateral lower extremities with other complications: Secondary | ICD-10-CM

## 2011-03-02 NOTE — Assessment & Plan Note (Signed)
OFFICE VISIT  Crisman, Gery H DOB:  10/12/1941                                       03/02/2011 ZOXWR#:[id number]  The patient had greater than 20 stab phlebectomies for residual bulging varicosities in the right thigh, lateral knee and pretibial region under local tumescent anesthesia.  He tolerated the procedure well and he will return in 2-3 months for final followup.  No further procedures are scheduled.    [name], M.D. Electronically Signed  JDL/MEDQ  D:  03/02/2011  T:  03/02/2011  Job:  [id number]

## 2011-03-12 ENCOUNTER — Telehealth: Payer: Self-pay | Admitting: Internal Medicine

## 2011-03-12 NOTE — Telephone Encounter (Signed)
Refill-nasonex nasal spray. Take 2 sprays each nostril once daily. Qty 17.0gm. Last fill 5.26.11

## 2011-03-13 MED ORDER — MOMETASONE FUROATE 50 MCG/ACT NA SUSP: 2.0000 | Freq: Every day | NASAL | Status: DC

## 2011-03-13 NOTE — Telephone Encounter (Signed)
Ok to refill x 3 

## 2011-03-13 NOTE — Telephone Encounter (Signed)
Rx refill sent to pharmacy. 

## 2011-03-24 NOTE — Procedures (Signed)
LOWER EXTREMITY VENOUS REFLUX EXAM   INDICATION:  Lower extremity varicose veins.   EXAM:  Using color-flow imaging and pulse Doppler spectral analysis, the  bilateral common femoral, superficial femoral, popliteal, posterior  tibial, greater and lesser saphenous veins are evaluated.  There is  evidence suggesting deep venous insufficiency in the right common  femoral vein with reflux of >500 milliseconds.  There is no evidence  suggesting deep venous insufficiency in the left lower extremity.   The right saphenofemoral junction is not competent with Reflux of  >559milliseconds. The left saphenofemoral junction is competent.  The  right GSV is not competent with Reflux of >560milliseconds with the  calibers as described below.  The left GSV is competent.   The bilateral proximal short saphenous vein demonstrates competency.   GSV Diameter (used if found to be incompetent only)                                            Right    Left  Proximal Greater Saphenous Vein           0.45 cm  cm  Proximal-to-mid-thigh                     cm       cm  Mid thigh                                 0.38 cm  cm  Mid-distal thigh                          cm       cm  Distal thigh                              0.41 cm  cm  Knee                                      0.46 cm  cm   IMPRESSION:  1. Right greater saphenous vein reflux of >500 milliseconds is noted.      No left greater saphenous vein reflux is noted.  2. The deep venous system is not competent with reflux of >500      milliseconds focally at the right common femoral vein level, as      described above.  3. The bilateral proximal short saphenous veins demonstrate      competency.        ___________________________________________  [name], M.D.   CH/MEDQ  D:  01/14/2010  T:  01/14/2010  Job:  [id number]

## 2011-03-24 NOTE — Procedures (Signed)
DUPLEX DEEP VENOUS EXAM - LOWER EXTREMITY   INDICATION:  Followup right GSV RFA.   HISTORY:  Edema:  Trauma/Surgery:  See above.  Pain:  Yes.  PE:  No.  Previous DVT:  Anticoagulants:  Other:   DUPLEX EXAM:                CFV   SFV   PopV  PTV    GSV                R  L  R  L  R  L  R   L  R  L  Thrombosis    o     o     o     o      +  Spontaneous   +     +     +     +      o  Phasic        +     +     +     +      o  Augmentation  +     +     +     +      o  Compressible  +     +     +     +      o  Competent     +     +     +     +      o   Legend:  + - yes  o - no  p - partial  D - decreased   IMPRESSION:  1. Successful ablation of right greater saphenous vein without deep      venous involvement.  2. Greater saphenous vein thrombus begins just distal to the junction      through the distal thigh.  3. Patent at the knee.    _____________________________  [name], M.D.   LT/MEDQ  D:  10/21/2010  T:  10/21/2010  Job:  [id number]

## 2011-03-24 NOTE — Consult Note (Signed)
NEW PATIENT CONSULTATION   Arwood, Naif  DOB:  1941/10/21                                       01/13/2010  EAVWU#:[id number]   The patient is a 70 year old male patient referred by Dr. Artist Pais for venous  insufficiency, particularly in the right leg with painful varicosities.  He has been having increasing discomfort over the past 3 years, which he  describes as an aching, throbbing, burning discomfort, which is severely  aggravated by being on his feet.  This involves primarily the lateral  calf area extending up to the knee.  He has no history of bleeding,  ulceration, thrombophlebitis, or deep vein thrombosis.  He has worn some  short leg Jobst (15-20 mm) stockings with some improvement but not  relief of his symptoms.  He does not elevate his legs on a regular  basis, nor take pain medicine, but states that the symptoms are becoming  more and more unbearable.   CHRONIC MEDICAL PROBLEMS:  1. Type 2 diabetes mellitus.  2. Coronary artery disease, mild, cath in 2008.  3. Hyperlipidemia.  4. Hypertension.  5. Sleep apnea.  6. GERD.   PREVIOUS SURGERIES:  Tonsillectomy.  Nissen fundoplication.  Sinus  surgery.  Heart cath.   FAMILY HISTORY:  Positive for coronary artery disease in his father age  29.  Negative for diabetes or stroke.   SOCIAL HISTORY:  He is married and has 2 children, is retired.  The  patient has never smoked.  He does not use alcohol.   REVIEW OF SYSTEMS:  Negative for anorexia, weight loss, chest pain,  dyspnea on exertion.  Does have a chronic cough at times with  bronchitis.  Denies any claudication symptoms.  Has occasional  headaches, diffuse joint pain.  All other systems in the review of  systems are negative.   PHYSICAL EXAM:  Blood pressure 141/68, heart rate 63, respirations 16.  Generally, he is a well-developed, well-nourished male in no apparent  distress.  Alert and oriented x3.  HEENT exam, EOMs intact.  Conjunctivae  normal.  Chest clear to auscultation.  No rhonchi or  wheezing.  Cardiovascular exam is a regular rhythm with no murmurs.  Carotid pulses are 3+ with no audible bruits.  Lower extremity pulses  are 3+ in the femoral and dorsalis pedis level.  Abdomen is soft,  nontender with no masses.  Musculoskeletal exam is free of major  deformities.  Neurologic exam is normal.  Skin is free of rashes.  Lower  extremity exam reveals no distal edema.  He has some moderate  varicosities in the lateral calf area, extending up lateral to the knee  which communicates with the great saphenous systems.  There is no  hyperpigmentation or ulceration noted in the right leg.  Left leg is  unremarkable.   Today, I ordered a venous duplex exam, which I reviewed and interpreted.  He has mild deep venous reflux, but primarily has superficial great  saphenous reflux on the right from the knee to the saphenofemoral  junction.  There is no evidence of deep venous obstruction.   I have reviewed the records provided by [facility] and feel  that the patient does have symptomatic venous insufficiency of the right  leg.  Will treat him conservatively with long-leg elastic compression  stockings (20 mm - 30 mm gradient)  as well as elevation and ibuprofen on  a regular basis.  He will return in 3 months.  If he has had no  improvement, I think he would be a candidate for laser ablation of the  right greater saphenous vein with possible sclerotherapy versus stab  phlebectomies in the lateral anterior compartment area.     [name], M.D.  Electronically Signed   JDL/MEDQ  D:  01/13/2010  T:  01/13/2010  Job:  [id number]   cc:   Barbette Hair. Artist Pais, DO

## 2011-03-24 NOTE — Assessment & Plan Note (Signed)
OFFICE VISIT   Cork, Emanual  DOB:  Apr 03, 1941                                       10/13/2010  ZOXWR#:[id number]   Patient has had laser ablation of his right great saphenous vein from  the knee to the saphenofemoral junction for valvular incompetence with  reflux causing secondary painful varicosities.  He tolerated the  procedure well.   He will return in 1 week for venous duplex exam to confirm closure of  his great saphenous vein and then return in 3 months for further exam  regarding the bulging varicosities.     [name], M.D.  Electronically Signed   JDL/MEDQ  D:  10/13/2010  T:  10/14/2010  Job:  [id number]

## 2011-03-24 NOTE — Assessment & Plan Note (Signed)
OFFICE VISIT   [name], Ian  DOB:  07-13-1941                                       10/21/2010  GNFAO#:[id number]   Patient returns today 1 week post laser ablation of his right great  saphenous vein with painful varicosities in the right leg secondary to  venous hypertension and valvular reflux in the right great saphenous  vein.  He tolerated the procedure well and has had some mild-to-moderate  discomfort along the course of the great saphenous vein from the knee to  the saphenofemoral junction, as one would expect.  He has had some mild  ecchymosis.  Tenderness is improving daily.  He has been wearing the  long-leg elastic compression stocking, as instructed, and taking  ibuprofen daily.  He has had no distal edema and states that the most  painful varix that he had preoperatively in the pretibial region in the  mid lower leg is now not causing pain.   On physical exam today, his chest is clear to palpation.  HEENT:  Normal  for age.  EOMs intact.  Alert and oriented x3.  Abdomen:  Soft,  nontender with no palpable masses.  Lower extremities exam reveals 3+  femoral, popliteal, and dorsalis pedis pulse palpable in the right leg.  There is mild tenderness along the course of the great saphenous vein up  to the saphenofemoral junction.  There is no distal edema.  He does have  a prominent varix running from the distal thigh lateral to the knee down  into the pretibial area.   Today I ordered a venous duplex exam, which I reviewed and interpreted.  There was total closure of the right great saphenous vein up to just  proximal to the saphenofemoral junction with no evidence of DVT.   I have reassured him regarding these findings.  We will have him return  in 3 months for further follow up to see if any further treatment such  as stab phlebectomy or sclerotherapy would be needed.     [name], M.D.  Electronically Signed   JDL/MEDQ  D:   10/21/2010  T:  10/22/2010  Job:  [id number]

## 2011-03-24 NOTE — Assessment & Plan Note (Signed)
OFFICE VISIT   Squillace, Zanden  DOB:  [birth year]                                       09/22/2010  ZOXWR#:[id number]   The patient returns today for further followup regarding his severe  venous insufficiency in the right leg secondary to valvular reflux in  the right great saphenous system.  He has some aching and throbbing  varicosities which have been present for over 3 years and caused  discomfort the longer he is on his feet.  He has been trying long-leg  elastic compression stockings (20 mm - 30-mm gradient) as well as  elevation and ibuprofen and has not had any success in relieving his  symptoms.  He denies any chest pain, dyspnea on exertion, PND,  orthopnea, chronic bronchitis.   CHRONIC MEDICAL PROBLEMS:  Coronary artery disease, type 2 diabetes  mellitus, hyperlipidemia and hypertension are stable.   PHYSICAL EXAM:  Vital signs:  Blood pressure 140/81, heart rate 56,  respirations 24.  General:  He is a well-developed, well-nourished male  in no apparent distress.  Lower extremity:  Reveals 3+ femoral,  popliteal and dorsalis pedis pulses bilaterally.  Right leg has  prominent varicosities beginning in the medial thigh extending down  lateral to the knee into the medial and lateral calf areas.  He does  have gross reflux in the right great saphenous system from the knee to  the saphenofemoral junction.   I think he is having significant symptoms with are affecting his daily  living and will plan to proceed with laser ablation of his right great  saphenous vein in the near future and will then see him back in 3 months  to see if any further sclerotherapy or other procedures are indicated.     [name], M.D.  Electronically Signed   JDL/MEDQ  D:  09/22/2010  T:  09/23/2010  Job:  [id number]

## 2011-04-24 ENCOUNTER — Other Ambulatory Visit: Payer: Self-pay | Admitting: Internal Medicine

## 2011-04-27 NOTE — Telephone Encounter (Signed)
Rx refill sent to pharmacy. 

## 2011-05-06 ENCOUNTER — Encounter: Payer: Self-pay | Admitting: Internal Medicine

## 2011-07-31 LAB — DIFFERENTIAL
Basophils Absolute: 0
Basophils Relative: 0
Eosinophils Absolute: 0.1
Eosinophils Relative: 1
Lymphocytes Relative: 2 — ABNORMAL LOW
Lymphs Abs: 0.2 — ABNORMAL LOW
Monocytes Absolute: 0.2
Monocytes Relative: 2 — ABNORMAL LOW
Neutro Abs: 10.1 — ABNORMAL HIGH
Neutrophils Relative %: 96 — ABNORMAL HIGH

## 2011-07-31 LAB — COMPREHENSIVE METABOLIC PANEL
ALT: 22
AST: 30
Albumin: 3.8
Alkaline Phosphatase: 37 — ABNORMAL LOW
BUN: 50 — ABNORMAL HIGH
CO2: 24
Calcium: 9.1
Chloride: 101
Creatinine, Ser: 1.23
GFR calc Af Amer: >60
GFR calc non Af Amer: 59 — ABNORMAL LOW
Glucose, Bld: 211 — ABNORMAL HIGH
Potassium: 4.5
Sodium: 136
Total Bilirubin: 1.5 — ABNORMAL HIGH
Total Protein: 7.2

## 2011-07-31 LAB — CBC
HCT: 41.6
Hemoglobin: 14.5
MCHC: 34.8
MCV: 88.2
Platelets: 211
RBC: 4.71
RDW: 12.9
WBC: 10.5

## 2011-07-31 LAB — POCT CARDIAC MARKERS
CKMB, poc: 3.4
Myoglobin, poc: 141
Operator id: 5451
Troponin i, poc: <0.05

## 2011-07-31 LAB — URINALYSIS, ROUTINE W REFLEX MICROSCOPIC
Bilirubin Urine: NEGATIVE
Glucose, UA: 100 — AB
Hgb urine dipstick: NEGATIVE
Nitrite: NEGATIVE
Protein, ur: NEGATIVE
Specific Gravity, Urine: 1.027
Urobilinogen, UA: 0.2
pH: 5

## 2011-07-31 LAB — URINE CULTURE
Colony Count: NO GROWTH
Culture: NO GROWTH

## 2011-07-31 LAB — LIPASE, BLOOD: Lipase: 40

## 2011-09-10 ENCOUNTER — Telehealth: Payer: Self-pay | Admitting: Internal Medicine

## 2011-09-10 NOTE — Telephone Encounter (Signed)
Pt is req to get complete blood panel/kidney func/a1c/psa. Pt says that its the labs that Dr Artist Pais normal order prior to pt ov.

## 2011-09-11 ENCOUNTER — Other Ambulatory Visit (INDEPENDENT_AMBULATORY_CARE_PROVIDER_SITE_OTHER): Payer: Medicare Other

## 2011-09-11 DIAGNOSIS — E785 Hyperlipidemia, unspecified: Secondary | ICD-10-CM

## 2011-09-11 DIAGNOSIS — E119 Type 2 diabetes mellitus without complications: Secondary | ICD-10-CM

## 2011-09-11 DIAGNOSIS — Z125 Encounter for screening for malignant neoplasm of prostate: Secondary | ICD-10-CM

## 2011-09-11 LAB — HEPATIC FUNCTION PANEL
ALT: 22 U/L (ref 0–53)
AST: 24 U/L (ref 0–37)
Albumin: 3.7 g/dL (ref 3.5–5.2)
Alkaline Phosphatase: 51 U/L (ref 39–117)
Bilirubin, Direct: 0.2 mg/dL (ref 0.0–0.3)
Total Bilirubin: 1.3 mg/dL — ABNORMAL HIGH (ref 0.3–1.2)
Total Protein: 7 g/dL (ref 6.0–8.3)

## 2011-09-11 LAB — PSA: PSA: 0.4 ng/mL (ref 0.10–4.00)

## 2011-09-11 LAB — BASIC METABOLIC PANEL
BUN: 21 mg/dL (ref 6–23)
CO2: 28 mEq/L (ref 19–32)
Calcium: 9.3 mg/dL (ref 8.4–10.5)
Chloride: 103 mEq/L (ref 96–112)
Creatinine, Ser: 1.1 mg/dL (ref 0.4–1.5)
GFR: 67.74 mL/min (ref >60.00)
Glucose, Bld: 86 mg/dL (ref 70–99)
Potassium: 4.1 mEq/L (ref 3.5–5.1)
Sodium: 138 mEq/L (ref 135–145)

## 2011-09-11 LAB — LIPID PANEL
Cholesterol: 174 mg/dL (ref 0–200)
HDL: 41.9 mg/dL (ref >39.00)
LDL Cholesterol: 115 mg/dL — ABNORMAL HIGH (ref 0–99)
Total CHOL/HDL Ratio: 4
Triglycerides: 84 mg/dL (ref 0.0–149.0)
VLDL: 16.8 mg/dL (ref 0.0–40.0)

## 2011-09-11 LAB — HEMOGLOBIN A1C: Hgb A1c MFr Bld: 6.2 % (ref 4.6–6.5)

## 2011-09-11 LAB — TSH: TSH: 0.5 u[IU]/mL (ref 0.35–5.50)

## 2011-09-11 NOTE — Telephone Encounter (Signed)
Pt has been called and schd for labs as noted.

## 2011-09-11 NOTE — Telephone Encounter (Signed)
Yes ok to schedule labs Bmet, a1c 250.00 Lipid panel, LFTs, TSH - 272.4 PSA - use v76.44 and BPH code

## 2011-09-16 ENCOUNTER — Encounter: Payer: Self-pay | Admitting: Internal Medicine

## 2011-09-16 ENCOUNTER — Ambulatory Visit (INDEPENDENT_AMBULATORY_CARE_PROVIDER_SITE_OTHER): Payer: Medicare Other | Admitting: Internal Medicine

## 2011-09-16 DIAGNOSIS — E785 Hyperlipidemia, unspecified: Secondary | ICD-10-CM

## 2011-09-16 DIAGNOSIS — I10 Essential (primary) hypertension: Secondary | ICD-10-CM

## 2011-09-16 DIAGNOSIS — E119 Type 2 diabetes mellitus without complications: Secondary | ICD-10-CM

## 2011-09-16 MED ORDER — OLMESARTAN MEDOXOMIL 20 MG PO TABS: ORAL_TABLET | ORAL | Status: DC

## 2011-09-16 MED ORDER — FINASTERIDE 5 MG PO TABS: 5.0000 mg | ORAL_TABLET | Freq: Every day | ORAL | Status: DC

## 2011-09-16 MED ORDER — GABAPENTIN 100 MG PO CAPS: ORAL_CAPSULE | ORAL | Status: DC

## 2011-09-16 MED ORDER — ZOLPIDEM TARTRATE ER 6.25 MG PO TBCR: 6.2500 mg | EXTENDED_RELEASE_TABLET | Freq: Every evening | ORAL | Status: DC | PRN

## 2011-09-16 MED ORDER — LORAZEPAM 0.5 MG PO TABS: 0.5000 mg | ORAL_TABLET | Freq: Every evening | ORAL | Status: DC | PRN

## 2011-09-16 MED ORDER — ATORVASTATIN CALCIUM 20 MG PO TABS: 20.0000 mg | ORAL_TABLET | Freq: Every day | ORAL | Status: DC

## 2011-09-16 MED ORDER — GLIMEPIRIDE 1 MG PO TABS: 1.0000 mg | ORAL_TABLET | Freq: Two times a day (BID) | ORAL | Status: DC

## 2011-09-16 MED ORDER — CARVEDILOL 3.125 MG PO TABS: 3.1250 mg | ORAL_TABLET | Freq: Two times a day (BID) | ORAL | Status: DC

## 2011-09-16 MED ORDER — DOXAZOSIN MESYLATE 1 MG PO TABS: 1.0000 mg | ORAL_TABLET | Freq: Every day | ORAL | Status: DC

## 2011-09-16 MED ORDER — OMEGA-3-ACID ETHYL ESTERS 1 G PO CAPS: 2.0000 g | ORAL_CAPSULE | Freq: Two times a day (BID) | ORAL | Status: DC

## 2011-09-16 MED ORDER — METFORMIN HCL 500 MG PO TABS: 500.0000 mg | ORAL_TABLET | Freq: Two times a day (BID) | ORAL | Status: DC

## 2011-09-16 NOTE — Assessment & Plan Note (Signed)
A1c is stable.   His renal function normalized.  Alma Friendly is cost prohibitive.  Start metformin (slowly).  We discussed potential GI side effects.  We discussed decreasing amaryl dose as we start metformin. Lab Results  Component Value Date   HGBA1C 6.2 09/11/2011

## 2011-09-16 NOTE — Progress Notes (Signed)
Subjective:    Patient ID: Ian [name], male    DOB: 24-Jul-1941, 70 y.o.   MRN: [id number]  Diabetes He presents for his follow-up diabetic visit. He has type 2 diabetes mellitus. His disease course has been stable. There are no hypoglycemic associated symptoms. Pertinent negatives for diabetes include no blurred vision, no chest pain, no fatigue, no foot ulcerations, no visual change and no weakness. Symptoms are stable. Pertinent negatives for diabetic complications include no CVA, nephropathy, peripheral neuropathy or retinopathy. Risk factors for coronary artery disease include hypertension, male sex and obesity. Current diabetic treatment includes oral agent (dual therapy). He is compliant with treatment most of the time. His weight is stable. He is following a generally healthy diet. Eye exam is current.      Review of Systems  Constitutional: Negative for fatigue.  Eyes: Negative for blurred vision.  Cardiovascular: Negative for chest pain.  Neurological: Negative for weakness.   Past Medical History  Diagnosis Date  . Allergy   . Diabetes mellitus     type II  . Hyperlipidemia   . Hypertension   . Apnea, sleep 07/28/07    NPSG [place], AHI 79.9  . GERD (gastroesophageal reflux disease)   . Hypospadias   . Coronary heart disease 2008    mild at cath    History   Social History  . Marital Status: Married    Spouse Name: N/A    Number of Children: 3  . Years of Education: N/A   Occupational History  . Retired Holiday representative    Social History Main Topics  . Smoking status: Never Smoker   . Smokeless tobacco: Not on file  . Alcohol Use: No  . Drug Use:   . Sexually Active:    Other Topics Concern  . Not on file   Social History Narrative  . No narrative on file    Past Surgical History  Procedure Date  . Tonsillectomy   . Nissen fundoplication   . Cardiac catheterization 03/2007    Family History  Problem Relation Age of Onset  . Coronary artery  disease      Allergies  Allergen Reactions  . Iohexol      Desc: pt. states severe reaction, dr. told patient never to have the dye again     Current Outpatient Prescriptions on File Prior to Visit  Medication Sig Dispense Refill  . aspirin 81 MG tablet Take 81 mg by mouth daily.        Marland Kitchen azelastine (ASTELIN) 137 MCG/SPRAY nasal spray 2 sprays by Nasal route 2 (two) times daily. Use in each nostril as directed  30 mL  2  . calcium-vitamin D 500 MG tablet Take 1 tablet by mouth 2 (two) times daily.        . Coenzyme Q10 (COQ10) 150 MG CAPS Take 1 capsule by mouth daily.        . fexofenadine (ALLEGRA) 180 MG tablet Take 180 mg by mouth daily.        . Fluticasone-Salmeterol (ADVAIR DISKUS) 250-50 MCG/DOSE AEPB Inhale 1 puff into the lungs every 12 (twelve) hours.        . Glucosamine-Chondroitin 1500-1200 MG/30ML LIQD Take by mouth. Twice a day       . glucose blood (FREESTYLE LITE) test strip 1 each by Other route as needed. Use to test blood sugar three times a day as instructed.  Dx. 250.00       . halcinonide (HALOG) 0.1 % external  solution Apply topically 2 (two) times daily.        . mometasone (NASONEX) 50 MCG/ACT nasal spray 2 sprays by Nasal route daily.  17 g  3  . Vitamins-Lipotropics (B-50) TABS Take 1 tablet by mouth daily.          BP 140/70  Pulse 68  Temp(Src) 98.1 F (36.7 C) (Oral)  Ht 5\' 7"  (1.702 m)  Wt 228 lb (103.42 kg)  BMI 35.71 kg/m2       Objective:   Physical Exam   Constitutional: Appears well-developed and well-nourished. No distress.  Head: Normocephalic and atraumatic.  Neck: Normal range of motion. Neck supple. No thyromegaly present. No carotid bruit Cardiovascular: Normal rate, regular rhythm and normal heart sounds.  Exam reveals no gallop and no friction rub.  No murmur heard. Pulmonary/Chest: Effort normal and breath sounds normal.  No wheezes. No rales.  Abdominal: Soft. Bowel sounds are normal. No mass. There is no tenderness.    Neurological: Alert. No cranial nerve deficit.  Skin: Skin is warm and dry.  Psychiatric: Normal mood and affect. Behavior is normal.  Foot:  No cracks of fissures.  Normal PD and PT pulses,  Normal sensation to vibration       Assessment & Plan:

## 2011-09-16 NOTE — Patient Instructions (Signed)
Decrease glimepiride dose as directed as you start metformin Monitor your blood pressure at home and bring your BP log to your next appointment

## 2011-09-16 NOTE — Assessment & Plan Note (Signed)
LDL 115.  We may need to resume higher dose of lipitor.  Continue lipitor 20 mg for now.

## 2011-09-16 NOTE — Assessment & Plan Note (Signed)
Sporadically elevated reading today.  He did not take all of his meds this AM.  Pt advised to monitor at home.  Goal BP < 130/80

## 2011-10-07 ENCOUNTER — Ambulatory Visit (INDEPENDENT_AMBULATORY_CARE_PROVIDER_SITE_OTHER): Payer: Medicare Other | Admitting: Internal Medicine

## 2011-10-07 VITALS — BP 122/70 | HR 68 | Temp 98.4°F | Wt 230.0 lb

## 2011-10-07 DIAGNOSIS — I10 Essential (primary) hypertension: Secondary | ICD-10-CM

## 2011-10-07 NOTE — Progress Notes (Signed)
Subjective:    Patient ID: Ian [name], male    DOB: 1941-05-13, 70 y.o.   MRN: [id number]  HPI  70 year old white male with past medical history of hypertension type 2 diabetes and hyperlipidemia reports elevated blood pressure readings at home. He recently obtained an automated blood pressure cuff. Home readings have been 170-150 systolic. He denies associated symptoms.  Review of Systems Negative for chest pain, negative headache  Past Medical History  Diagnosis Date  . Allergy   . Diabetes mellitus     type II  . Hyperlipidemia   . Hypertension   . Apnea, sleep 07/28/07    NPS[facility] [place], AHI 79.9  . GERD (gastroesophageal reflux disease)   . Hypospadias   . Coronary heart disease 2008    mild at cath    History   Social History  . Marital Status: Married    Spouse Name: N/A    Number of Children: 3  . Years of Education: N/A   Occupational History  . Retired Holiday representative    Social History Main Topics  . Smoking status: Never Smoker   . Smokeless tobacco: Not on file  . Alcohol Use: No  . Drug Use:   . Sexually Active:    Other Topics Concern  . Not on file   Social History Narrative  . No narrative on file    Past Surgical History  Procedure Date  . Tonsillectomy   . Nissen fundoplication   . Cardiac catheterization 03/2007    Family History  Problem Relation Age of Onset  . Coronary artery disease      Allergies  Allergen Reactions  . Iohexol      Desc: pt. states severe reaction, dr. told patient never to have the dye again     Current Outpatient Prescriptions on File Prior to Visit  Medication Sig Dispense Refill  . aspirin 81 MG tablet Take 81 mg by mouth daily.        Marland Kitchen azelastine (ASTELIN) 137 MCG/SPRAY nasal spray 2 sprays by Nasal route 2 (two) times daily. Use in each nostril as directed  30 mL  2  . calcium-vitamin D 500 MG tablet Take 1 tablet by mouth 2 (two) times daily.        . carvedilol (COREG) 3.125 MG tablet Take 1  tablet (3.125 mg total) by mouth 2 (two) times daily with a meal.  180 tablet  3  . Coenzyme Q10 (COQ10) 150 MG CAPS Take 1 capsule by mouth daily.        Marland Kitchen doxazosin (CARDURA) 1 MG tablet Take 1 tablet (1 mg total) by mouth at bedtime.  90 tablet  3  . fexofenadine (ALLEGRA) 180 MG tablet Take 180 mg by mouth daily.        . finasteride (PROSCAR) 5 MG tablet Take 1 tablet (5 mg total) by mouth daily.  90 tablet  3  . gabapentin (NEURONTIN) 100 MG capsule One to two daily prn  180 capsule  3  . glimepiride (AMARYL) 1 MG tablet Take 1 tablet (1 mg total) by mouth 2 (two) times daily.  180 tablet  3  . Glucosamine-Chondroitin 1500-1200 MG/30ML LIQD Take by mouth. Twice a day       . glucose blood (FREESTYLE LITE) test strip 1 each by Other route as needed. Use to test blood sugar three times a day as instructed.  Dx. 250.00       . halcinonide (HALOG) 0.1 % external solution  Apply topically 2 (two) times daily.        Marland Kitchen LORazepam (ATIVAN) 0.5 MG tablet Take 1 tablet (0.5 mg total) by mouth at bedtime as needed.  90 tablet  3  . metFORMIN (GLUCOPHAGE) 500 MG tablet Take 1 tablet (500 mg total) by mouth 2 (two) times daily with a meal.  180 tablet  3  . mometasone (NASONEX) 50 MCG/ACT nasal spray 2 sprays by Nasal route daily.  17 g  3  . olmesartan (BENICAR) 20 MG tablet One tab once daily  90 tablet  3  . omega-3 acid ethyl esters (LOVAZA) 1 G capsule Take 2 capsules (2 g total) by mouth 2 (two) times daily.  360 capsule  3  . Vitamins-Lipotropics (B-50) TABS Take 1 tablet by mouth daily.        Marland Kitchen zolpidem (AMBIEN CR) 6.25 MG CR tablet Take 1 tablet (6.25 mg total) by mouth at bedtime as needed for sleep.  90 tablet  1    BP 122/70  Pulse 68  Temp(Src) 98.4 F (36.9 C) (Oral)  Wt 230 lb (104.327 kg)      Objective:   Physical Exam  Constitutional: Appears well-developed and well-nourished. No distress.  Head: Normocephalic and atraumatic.  Neck: Normal range of motion. Neck supple. No  thyromegaly present. No carotid bruit Cardiovascular: Normal rate, regular rhythm and normal heart sounds. Exam reveals no gallop and no friction rub. No murmur heard.  Pulmonary/Chest: Effort normal and breath sounds normal. No wheezes. No rales.  Psychiatric: Normal mood and affect. Behavior is normal.        Assessment & Plan:

## 2011-10-07 NOTE — Patient Instructions (Signed)
Arrange nurse visit so we can check accuracy of your home blood pressure cuff.

## 2011-10-07 NOTE — Assessment & Plan Note (Signed)
I suspect the patient's elevated blood pressure readings at home are due to machine error.  Patient advised to bring home automated cuff to nurse visit.   No change in blood pressure medication. BP: 122/70 mmHg

## 2011-11-13 ENCOUNTER — Telehealth: Payer: Self-pay | Admitting: Internal Medicine

## 2011-11-13 NOTE — Telephone Encounter (Signed)
rx up front ready for p/u, pt aware 

## 2011-11-13 NOTE — Telephone Encounter (Signed)
LMTCB

## 2011-11-13 NOTE — Telephone Encounter (Signed)
Pt called and is req to get a new script for new blood glucose meter,lancets and test strips. Pt does not want a specic brand specified on script. Pt wants to be able to select which meter he would like, when he gets to the pharmacy. [facility] [address]. Pls notify pt when this has been called in.

## 2011-12-14 ENCOUNTER — Other Ambulatory Visit (INDEPENDENT_AMBULATORY_CARE_PROVIDER_SITE_OTHER): Payer: Medicare Other

## 2011-12-14 DIAGNOSIS — E119 Type 2 diabetes mellitus without complications: Secondary | ICD-10-CM

## 2011-12-14 LAB — BASIC METABOLIC PANEL
BUN: 21 mg/dL (ref 6–23)
CO2: 30 mEq/L (ref 19–32)
Calcium: 9 mg/dL (ref 8.4–10.5)
Chloride: 105 mEq/L (ref 96–112)
Creatinine, Ser: 1.1 mg/dL (ref 0.4–1.5)
GFR: 73.68 mL/min (ref >60.00)
Glucose, Bld: 99 mg/dL (ref 70–99)
Potassium: 4.2 mEq/L (ref 3.5–5.1)
Sodium: 141 mEq/L (ref 135–145)

## 2011-12-14 LAB — HEPATIC FUNCTION PANEL
ALT: 22 U/L (ref 0–53)
AST: 23 U/L (ref 0–37)
Albumin: 3.6 g/dL (ref 3.5–5.2)
Alkaline Phosphatase: 40 U/L (ref 39–117)
Bilirubin, Direct: 0.2 mg/dL (ref 0.0–0.3)
Total Bilirubin: 1 mg/dL (ref 0.3–1.2)
Total Protein: 6.5 g/dL (ref 6.0–8.3)

## 2011-12-14 LAB — LIPID PANEL
Cholesterol: 173 mg/dL (ref 0–200)
HDL: 37.6 mg/dL — ABNORMAL LOW (ref >39.00)
LDL Cholesterol: 109 mg/dL — ABNORMAL HIGH (ref 0–99)
Total CHOL/HDL Ratio: 5
Triglycerides: 134 mg/dL (ref 0.0–149.0)
VLDL: 26.8 mg/dL (ref 0.0–40.0)

## 2011-12-14 LAB — HEMOGLOBIN A1C: Hgb A1c MFr Bld: 6.5 % (ref 4.6–6.5)

## 2011-12-16 ENCOUNTER — Other Ambulatory Visit: Payer: Self-pay | Admitting: Internal Medicine

## 2011-12-16 ENCOUNTER — Telehealth: Payer: Self-pay | Admitting: Internal Medicine

## 2011-12-16 NOTE — Telephone Encounter (Signed)
Pls call metFORMIN (GLUCOPHAGE) 500 MG tablet in to  [facility] [address] [facility] [place]. Pt only has 1 day remaining on med. Pls call this in today.

## 2011-12-17 MED ORDER — METFORMIN HCL 500 MG PO TABS: 500.0000 mg | ORAL_TABLET | Freq: Two times a day (BID) | ORAL | Status: DC

## 2011-12-17 NOTE — Telephone Encounter (Signed)
rx sent in electronically 

## 2011-12-21 ENCOUNTER — Encounter: Payer: Self-pay | Admitting: Internal Medicine

## 2011-12-21 ENCOUNTER — Ambulatory Visit (INDEPENDENT_AMBULATORY_CARE_PROVIDER_SITE_OTHER): Payer: Medicare Other | Admitting: Internal Medicine

## 2011-12-21 VITALS — BP 152/82 | HR 64 | Temp 98.5°F | Ht 68.0 in | Wt 227.0 lb

## 2011-12-21 DIAGNOSIS — R05 Cough: Secondary | ICD-10-CM | POA: Diagnosis not present

## 2011-12-21 DIAGNOSIS — E119 Type 2 diabetes mellitus without complications: Secondary | ICD-10-CM

## 2011-12-21 DIAGNOSIS — I10 Essential (primary) hypertension: Secondary | ICD-10-CM | POA: Diagnosis not present

## 2011-12-21 DIAGNOSIS — R059 Cough, unspecified: Secondary | ICD-10-CM

## 2011-12-21 MED ORDER — OMEPRAZOLE 20 MG PO CPDR: 20.0000 mg | DELAYED_RELEASE_CAPSULE | Freq: Two times a day (BID) | ORAL | Status: DC

## 2011-12-21 MED ORDER — GLIMEPIRIDE 1 MG PO TABS: 1.0000 mg | ORAL_TABLET | Freq: Two times a day (BID) | ORAL | Status: DC

## 2011-12-21 MED ORDER — OLMESARTAN MEDOXOMIL 20 MG PO TABS: ORAL_TABLET | ORAL | Status: DC

## 2011-12-21 MED ORDER — OMEGA-3-ACID ETHYL ESTERS 1 G PO CAPS: 2.0000 g | ORAL_CAPSULE | Freq: Two times a day (BID) | ORAL | Status: DC

## 2011-12-21 MED ORDER — AMLODIPINE BESYLATE 5 MG PO TABS: 5.0000 mg | ORAL_TABLET | Freq: Every day | ORAL | Status: DC

## 2011-12-21 MED ORDER — DOXAZOSIN MESYLATE 1 MG PO TABS: 1.0000 mg | ORAL_TABLET | Freq: Every day | ORAL | Status: DC

## 2011-12-21 NOTE — Assessment & Plan Note (Signed)
A1c is slightly worse.  Continue metformin and amaryl.

## 2011-12-21 NOTE — Assessment & Plan Note (Signed)
71 year old white male with chronic cough x1 month.   I suspect symptoms related to allergic rhinitis.  Rule out other pulmonary cause - obtain CXR.  Add mucinex and nasal saline to daily fexofenodine.  Patient advised to call office if symptoms persist or worsen.

## 2011-12-21 NOTE — Progress Notes (Signed)
Subjective:    Patient ID: Ian [name], male    DOB: May 30, 1941, 71 y.o.   MRN: [id number]  HPI  A 71 year old white male with past medical history type 2 diabetes, hypertension and hyperlipidemia for routine followup. Overall patient has been doing very well. In regards to his type 2 diabetes his A1c is slightly worse. His attributes this to less physical activity for the last 2 or 3 months. We also discontinued his Januvia and he is just taking metformin and glyburide.  Hypertension-patient continues to monitor his blood pressure at home and he has been getting intermittent elevated readings. He increased his Benicar to 20 mg once daily.  Blood work reviewed in detail with patient.  He also complains of chronic intermittent cough x 1 month.  He denies sinus congestion.   He denies chest tightness. Review of Systems Negative for chest pain    Past Medical History  Diagnosis Date  . Allergy   . Diabetes mellitus     type II  . Hyperlipidemia   . Hypertension   . Apnea, sleep 07/28/07    NPSG [place], AHI 79.9  . GERD (gastroesophageal reflux disease)   . Hypospadias   . Coronary heart disease 2008    mild at cath    History   Social History  . Marital Status: Married    Spouse Name: N/A    Number of Children: 3  . Years of Education: N/A   Occupational History  . Retired Holiday representative    Social History Main Topics  . Smoking status: Never Smoker   . Smokeless tobacco: Not on file  . Alcohol Use: No  . Drug Use:   . Sexually Active:    Other Topics Concern  . Not on file   Social History Narrative  . No narrative on file    Past Surgical History  Procedure Date  . Tonsillectomy   . Nissen fundoplication   . Cardiac catheterization 03/2007    Family History  Problem Relation Age of Onset  . Coronary artery disease      Allergies  Allergen Reactions  . Iohexol      Desc: pt. states severe reaction, dr. told patient never to have the dye again       Current Outpatient Prescriptions on File Prior to Visit  Medication Sig Dispense Refill  . aspirin 81 MG tablet Take 81 mg by mouth daily.        Marland Kitchen atorvastatin (LIPITOR) 20 MG tablet Take 40 mg by mouth daily.        Marland Kitchen azelastine (ASTELIN) 137 MCG/SPRAY nasal spray 2 sprays by Nasal route 2 (two) times daily. Use in each nostril as directed  30 mL  2  . calcium-vitamin D 500 MG tablet Take 1 tablet by mouth 2 (two) times daily.        . carvedilol (COREG) 3.125 MG tablet Take 1 tablet (3.125 mg total) by mouth 2 (two) times daily with a meal.  180 tablet  3  . Coenzyme Q10 (COQ10) 150 MG CAPS Take 1 capsule by mouth daily.        . fexofenadine (ALLEGRA) 180 MG tablet Take 180 mg by mouth daily.        . finasteride (PROSCAR) 5 MG tablet Take 1 tablet (5 mg total) by mouth daily.  90 tablet  3  . gabapentin (NEURONTIN) 100 MG capsule One to two daily prn  180 capsule  3  . Glucosamine-Chondroitin 1500-1200 MG/30ML  LIQD Take by mouth. Twice a day       . glucose blood (FREESTYLE LITE) test strip 1 each by Other route as needed. Use to test blood sugar three times a day as instructed.  Dx. 250.00       . halcinonide (HALOG) 0.1 % external solution Apply topically 2 (two) times daily.        Marland Kitchen LORazepam (ATIVAN) 0.5 MG tablet Take 1 tablet (0.5 mg total) by mouth at bedtime as needed.  90 tablet  3  . metFORMIN (GLUCOPHAGE) 500 MG tablet Take 1 tablet (500 mg total) by mouth 2 (two) times daily with a meal.  180 tablet  3  . mometasone (NASONEX) 50 MCG/ACT nasal spray 2 sprays by Nasal route daily.  17 g  3  . Vitamins-Lipotropics (B-50) TABS Take 1 tablet by mouth daily.          BP 152/82  Pulse 64  Temp(Src) 98.5 F (36.9 C) (Oral)  Ht 5\' 8"  (1.727 m)  Wt 227 lb (102.967 kg)  BMI 34.52 kg/m2    Objective:   Physical Exam  Constitutional: He is oriented to person, place, and time. He appears well-developed and well-nourished.  HENT:  Head: Normocephalic and atraumatic.  Right  Ear: External ear normal.  Left Ear: External ear normal.  Mouth/Throat: Oropharynx is clear and moist.  Neck: Neck supple.  Cardiovascular: Normal rate, regular rhythm and normal heart sounds.   Pulmonary/Chest: Effort normal and breath sounds normal. He has no wheezes. He has no rales.  Musculoskeletal: He exhibits no edema.  Lymphadenopathy:    He has no cervical adenopathy.  Neurological: He is alert and oriented to person, place, and time.  Skin: Skin is warm and dry.  Psychiatric: He has a normal mood and affect. His behavior is normal.       Assessment & Plan:

## 2011-12-21 NOTE — Assessment & Plan Note (Signed)
BP trending higher.  Add amlodipine.  Patient advised to continue monitoring BP at home. BP: 152/82 mmHg

## 2012-01-01 ENCOUNTER — Ambulatory Visit (HOSPITAL_BASED_OUTPATIENT_CLINIC_OR_DEPARTMENT_OTHER)
Admission: RE | Admit: 2012-01-01 | Discharge: 2012-01-01 | Disposition: A | Discharge status: Home / Self Care | Payer: Medicare Other | Source: Ambulatory Visit | Attending: Internal Medicine | Admitting: Internal Medicine

## 2012-01-01 DIAGNOSIS — R918 Other nonspecific abnormal finding of lung field: Secondary | ICD-10-CM

## 2012-01-01 DIAGNOSIS — R05 Cough: Secondary | ICD-10-CM | POA: Insufficient documentation

## 2012-01-01 DIAGNOSIS — R059 Cough, unspecified: Secondary | ICD-10-CM | POA: Diagnosis not present

## 2012-01-01 NOTE — Progress Notes (Signed)
Quick Note:  Pt aware ______ 

## 2012-01-04 ENCOUNTER — Other Ambulatory Visit: Payer: Self-pay | Admitting: Internal Medicine

## 2012-01-04 DIAGNOSIS — R053 Chronic cough: Secondary | ICD-10-CM

## 2012-01-04 DIAGNOSIS — R05 Cough: Secondary | ICD-10-CM

## 2012-01-04 DIAGNOSIS — R9389 Abnormal findings on diagnostic imaging of other specified body structures: Secondary | ICD-10-CM

## 2012-01-07 ENCOUNTER — Ambulatory Visit (INDEPENDENT_AMBULATORY_CARE_PROVIDER_SITE_OTHER)
Admission: RE | Admit: 2012-01-07 | Discharge: 2012-01-07 | Disposition: A | Discharge status: Home / Self Care | Payer: Medicare Other | Source: Ambulatory Visit | Attending: Internal Medicine | Admitting: Internal Medicine

## 2012-01-07 DIAGNOSIS — R918 Other nonspecific abnormal finding of lung field: Secondary | ICD-10-CM | POA: Diagnosis not present

## 2012-01-07 DIAGNOSIS — R05 Cough: Secondary | ICD-10-CM | POA: Diagnosis not present

## 2012-01-07 DIAGNOSIS — R053 Chronic cough: Secondary | ICD-10-CM

## 2012-01-07 DIAGNOSIS — R059 Cough, unspecified: Secondary | ICD-10-CM | POA: Diagnosis not present

## 2012-01-07 DIAGNOSIS — R9389 Abnormal findings on diagnostic imaging of other specified body structures: Secondary | ICD-10-CM

## 2012-01-07 DIAGNOSIS — J984 Other disorders of lung: Secondary | ICD-10-CM | POA: Diagnosis not present

## 2012-01-07 DIAGNOSIS — R079 Chest pain, unspecified: Secondary | ICD-10-CM | POA: Diagnosis not present

## 2012-02-19 ENCOUNTER — Ambulatory Visit: Payer: Medicare Other | Admitting: Internal Medicine

## 2012-03-03 ENCOUNTER — Other Ambulatory Visit: Payer: Self-pay | Admitting: *Deleted

## 2012-03-03 MED ORDER — LORAZEPAM 0.5 MG PO TABS: 0.5000 mg | ORAL_TABLET | Freq: Every evening | ORAL | Status: DC | PRN

## 2012-03-16 DIAGNOSIS — H2510 Age-related nuclear cataract, unspecified eye: Secondary | ICD-10-CM | POA: Diagnosis not present

## 2012-03-16 DIAGNOSIS — H35039 Hypertensive retinopathy, unspecified eye: Secondary | ICD-10-CM | POA: Diagnosis not present

## 2012-03-16 DIAGNOSIS — H40019 Open angle with borderline findings, low risk, unspecified eye: Secondary | ICD-10-CM | POA: Diagnosis not present

## 2012-03-16 DIAGNOSIS — H35379 Puckering of macula, unspecified eye: Secondary | ICD-10-CM | POA: Diagnosis not present

## 2012-03-17 ENCOUNTER — Telehealth: Payer: Self-pay | Admitting: Internal Medicine

## 2012-03-17 NOTE — Telephone Encounter (Signed)
Pt would like to have blood work done before his appt on 03/25/12. Pt requesting to make sure there is an order in the system because he had had issues in the past with going to the lab and having confusion about what labs needs to be drawn. Pt requesting to be contacted by the nurse

## 2012-03-18 ENCOUNTER — Ambulatory Visit: Payer: Medicare Other | Admitting: Internal Medicine

## 2012-03-18 NOTE — Telephone Encounter (Signed)
plz schedule BMET, A1c, and microalb / cr ratio - 250.00

## 2012-03-21 ENCOUNTER — Other Ambulatory Visit (INDEPENDENT_AMBULATORY_CARE_PROVIDER_SITE_OTHER): Payer: Medicare Other

## 2012-03-21 DIAGNOSIS — E119 Type 2 diabetes mellitus without complications: Secondary | ICD-10-CM

## 2012-03-21 DIAGNOSIS — IMO0001 Reserved for inherently not codable concepts without codable children: Secondary | ICD-10-CM | POA: Diagnosis not present

## 2012-03-21 LAB — BASIC METABOLIC PANEL
BUN: 26 mg/dL — ABNORMAL HIGH (ref 6–23)
CO2: 25 mEq/L (ref 19–32)
Calcium: 8.7 mg/dL (ref 8.4–10.5)
Chloride: 101 mEq/L (ref 96–112)
Creatinine, Ser: 1.1 mg/dL (ref 0.4–1.5)
GFR: 72.82 mL/min (ref >60.00)
Glucose, Bld: 156 mg/dL — ABNORMAL HIGH (ref 70–99)
Potassium: 4.1 mEq/L (ref 3.5–5.1)
Sodium: 139 mEq/L (ref 135–145)

## 2012-03-21 LAB — MICROALBUMIN / CREATININE URINE RATIO
Creatinine,U: 106.7 mg/dL
Microalb Creat Ratio: 0.8 mg/g (ref 0.0–30.0)
Microalb, Ur: 0.9 mg/dL (ref 0.0–1.9)

## 2012-03-21 LAB — HEMOGLOBIN A1C: Hgb A1c MFr Bld: 6.3 % (ref 4.6–6.5)

## 2012-03-25 ENCOUNTER — Ambulatory Visit (INDEPENDENT_AMBULATORY_CARE_PROVIDER_SITE_OTHER): Payer: Medicare Other | Admitting: Internal Medicine

## 2012-03-25 ENCOUNTER — Encounter: Payer: Self-pay | Admitting: Internal Medicine

## 2012-03-25 VITALS — BP 120/80 | HR 64 | Temp 97.9°F | Wt 224.0 lb

## 2012-03-25 DIAGNOSIS — G4700 Insomnia, unspecified: Secondary | ICD-10-CM | POA: Diagnosis not present

## 2012-03-25 DIAGNOSIS — E119 Type 2 diabetes mellitus without complications: Secondary | ICD-10-CM

## 2012-03-25 DIAGNOSIS — I10 Essential (primary) hypertension: Secondary | ICD-10-CM

## 2012-03-25 DIAGNOSIS — F5104 Psychophysiologic insomnia: Secondary | ICD-10-CM | POA: Insufficient documentation

## 2012-03-25 MED ORDER — DOXAZOSIN MESYLATE 1 MG PO TABS: 1.0000 mg | ORAL_TABLET | Freq: Every day | ORAL | Status: DC

## 2012-03-25 MED ORDER — GLIMEPIRIDE 1 MG PO TABS: 1.0000 mg | ORAL_TABLET | Freq: Two times a day (BID) | ORAL | Status: DC

## 2012-03-25 MED ORDER — ATORVASTATIN CALCIUM 20 MG PO TABS: 20.0000 mg | ORAL_TABLET | Freq: Every day | ORAL | Status: DC

## 2012-03-25 MED ORDER — OMEGA-3-ACID ETHYL ESTERS 1 G PO CAPS: 2.0000 g | ORAL_CAPSULE | Freq: Two times a day (BID) | ORAL | Status: DC

## 2012-03-25 MED ORDER — LORAZEPAM 0.5 MG PO TABS: 0.5000 mg | ORAL_TABLET | Freq: Every evening | ORAL | Status: DC | PRN

## 2012-03-25 MED ORDER — OLMESARTAN MEDOXOMIL 20 MG PO TABS: ORAL_TABLET | ORAL | Status: DC

## 2012-03-25 MED ORDER — CARVEDILOL 3.125 MG PO TABS: 3.1250 mg | ORAL_TABLET | Freq: Two times a day (BID) | ORAL | Status: DC

## 2012-03-25 MED ORDER — FINASTERIDE 5 MG PO TABS: 5.0000 mg | ORAL_TABLET | Freq: Every day | ORAL | Status: DC

## 2012-03-25 MED ORDER — GABAPENTIN 300 MG PO CAPS: 300.0000 mg | ORAL_CAPSULE | Freq: Every day | ORAL | Status: DC

## 2012-03-25 MED ORDER — AMLODIPINE BESYLATE 5 MG PO TABS: 5.0000 mg | ORAL_TABLET | Freq: Every day | ORAL | Status: DC

## 2012-03-25 NOTE — Assessment & Plan Note (Addendum)
Blood sugars are stable. Patient has been able to achieve mild to moderate weight loss by decreasing caloric intake. Patient understands to decrease glyburide dose if he experiences low blood sugars. Continue same dose of metformin.  Lab Results  Component Value Date   HGBA1C 6.3 03/21/2012   Lab Results  Component Value Date   CREATININE 1.1 03/21/2012

## 2012-03-25 NOTE — Assessment & Plan Note (Signed)
Well controlled.  No change in medication.

## 2012-03-25 NOTE — Progress Notes (Signed)
Subjective:    Patient ID: Ian [name], male    DOB: 1941/09/24, 71 y.o.   MRN: [id number]  HPI  71 year old white male with type 2 diabetes hypertension and obesity for routine followup. In regards to his diabetes his blood sugars are well controlled he has a rare hypoglycemia. His hemoglobin A1c is stable at 6.3.  Previously he was started and gabapentin for symptoms of restless leg syndrome. He is currently taking 200 mg of gabapentin at bedtime. His symptoms have significantly improved.  He has history of chronic insomnia. He previously used Ambien CR discontinued due to symptoms of foggy sensation in the morning. He uses lorazepam 0.5 mg as needed.  However her sleep quality is still suboptimal. He has history of obstructive sleep apnea but is unable to tolerate CPAP mask.  Review of Systems Negative for chest pain or shortness of breath  Past Medical History  Diagnosis Date  . Allergy   . Diabetes mellitus     type II  . Hyperlipidemia   . Hypertension   . Apnea, sleep 07/28/07    NPSG [place], AHI 79.9  . GERD (gastroesophageal reflux disease)   . Hypospadias   . Coronary heart disease 2008    mild at cath    History   Social History  . Marital Status: Married    Spouse Name: N/A    Number of Children: 3  . Years of Education: N/A   Occupational History  . Retired Holiday representative    Social History Main Topics  . Smoking status: Never Smoker   . Smokeless tobacco: Not on file  . Alcohol Use: No  . Drug Use:   . Sexually Active:    Other Topics Concern  . Not on file   Social History Narrative  . No narrative on file    Past Surgical History  Procedure Date  . Tonsillectomy   . Nissen fundoplication   . Cardiac catheterization 03/2007    Family History  Problem Relation Age of Onset  . Coronary artery disease      Allergies  Allergen Reactions  . Iohexol      Desc: pt. states severe reaction, dr. told patient never to have the dye again      Current Outpatient Prescriptions on File Prior to Visit  Medication Sig Dispense Refill  . aspirin 81 MG tablet Take 81 mg by mouth daily.        Marland Kitchen azelastine (ASTELIN) 137 MCG/SPRAY nasal spray 2 sprays by Nasal route 2 (two) times daily. Use in each nostril as directed  30 mL  2  . calcium-vitamin D 500 MG tablet Take 1 tablet by mouth 2 (two) times daily.        . Coenzyme Q10 (COQ10) 150 MG CAPS Take 1 capsule by mouth daily.        . fexofenadine (ALLEGRA) 180 MG tablet Take 180 mg by mouth daily.        . Glucosamine-Chondroitin 1500-1200 MG/30ML LIQD Take by mouth. Twice a day       . glucose blood (FREESTYLE LITE) test strip 1 each by Other route as needed. Use to test blood sugar three times a day as instructed.  Dx. 250.00       . halcinonide (HALOG) 0.1 % external solution Apply topically 2 (two) times daily.        . metFORMIN (GLUCOPHAGE) 500 MG tablet Take 1 tablet (500 mg total) by mouth 2 (two) times daily with a  meal.  180 tablet  3  . mometasone (NASONEX) 50 MCG/ACT nasal spray 2 sprays by Nasal route daily.  17 g  3  . omeprazole (PRILOSEC) 20 MG capsule Take 1 capsule (20 mg total) by mouth 2 (two) times daily.  180 capsule  3  . Vitamins-Lipotropics (B-50) TABS Take 1 tablet by mouth daily.        Marland Kitchen DISCONTD: amLODipine (NORVASC) 5 MG tablet Take 1 tablet (5 mg total) by mouth daily.  90 tablet  1  . DISCONTD: atorvastatin (LIPITOR) 20 MG tablet Take 40 mg by mouth daily.        Marland Kitchen DISCONTD: carvedilol (COREG) 3.125 MG tablet Take 1 tablet (3.125 mg total) by mouth 2 (two) times daily with a meal.  180 tablet  3  . DISCONTD: doxazosin (CARDURA) 1 MG tablet Take 1 tablet (1 mg total) by mouth at bedtime.  90 tablet  3  . DISCONTD: gabapentin (NEURONTIN) 100 MG capsule One to two daily prn  180 capsule  3  . DISCONTD: glimepiride (AMARYL) 1 MG tablet Take 1 tablet (1 mg total) by mouth 2 (two) times daily.  180 tablet  3  . DISCONTD: olmesartan (BENICAR) 20 MG tablet One  tab once daily  90 tablet  3  . DISCONTD: omega-3 acid ethyl esters (LOVAZA) 1 G capsule Take 2 capsules (2 g total) by mouth 2 (two) times daily.  360 capsule  3    BP 120/80  Pulse 64  Temp(Src) 97.9 F (36.6 C) (Oral)  Wt 224 lb (101.606 kg)       Objective:   Physical Exam  Constitutional: He is oriented to person, place, and time. He appears well-developed and well-nourished.  Cardiovascular: Normal rate, regular rhythm and normal heart sounds.   No murmur heard. Pulmonary/Chest: Effort normal and breath sounds normal. He has no wheezes. He has no rales.  Musculoskeletal: He exhibits no edema.  Neurological: He is alert and oriented to person, place, and time.  Skin: Skin is warm and dry.  Psychiatric: He has a normal mood and affect. His behavior is normal.          Assessment & Plan:

## 2012-03-25 NOTE — Assessment & Plan Note (Signed)
Patient could not tolerate Ambien CR to the symptoms of mental dullness following day. Continue low-dose lorazepam as needed. Gabapentin has helped his restless leg syndrome symptoms. We discussed increasing dose to 300 mg to see if it helps his insomnia.

## 2012-03-25 NOTE — Patient Instructions (Signed)
Please complete the following lab tests before your next follow up appointment: BMET - 401.9 A1c - 250.00 

## 2012-05-17 DIAGNOSIS — I10 Essential (primary) hypertension: Secondary | ICD-10-CM | POA: Diagnosis not present

## 2012-05-17 DIAGNOSIS — E119 Type 2 diabetes mellitus without complications: Secondary | ICD-10-CM | POA: Diagnosis not present

## 2012-05-17 DIAGNOSIS — I2510 Atherosclerotic heart disease of native coronary artery without angina pectoris: Secondary | ICD-10-CM | POA: Diagnosis not present

## 2012-05-17 DIAGNOSIS — E785 Hyperlipidemia, unspecified: Secondary | ICD-10-CM | POA: Diagnosis not present

## 2012-05-17 DIAGNOSIS — N289 Disorder of kidney and ureter, unspecified: Secondary | ICD-10-CM | POA: Diagnosis not present

## 2012-05-17 DIAGNOSIS — F411 Generalized anxiety disorder: Secondary | ICD-10-CM | POA: Diagnosis not present

## 2012-05-17 DIAGNOSIS — E1142 Type 2 diabetes mellitus with diabetic polyneuropathy: Secondary | ICD-10-CM | POA: Diagnosis not present

## 2012-05-17 DIAGNOSIS — N400 Enlarged prostate without lower urinary tract symptoms: Secondary | ICD-10-CM | POA: Diagnosis not present

## 2012-05-17 DIAGNOSIS — G4700 Insomnia, unspecified: Secondary | ICD-10-CM | POA: Diagnosis not present

## 2012-05-27 DIAGNOSIS — R0989 Other specified symptoms and signs involving the circulatory and respiratory systems: Secondary | ICD-10-CM | POA: Diagnosis not present

## 2012-05-30 DIAGNOSIS — I2510 Atherosclerotic heart disease of native coronary artery without angina pectoris: Secondary | ICD-10-CM | POA: Diagnosis not present

## 2012-05-30 DIAGNOSIS — E785 Hyperlipidemia, unspecified: Secondary | ICD-10-CM | POA: Diagnosis not present

## 2012-05-30 DIAGNOSIS — R9431 Abnormal electrocardiogram [ECG] [EKG]: Secondary | ICD-10-CM | POA: Diagnosis not present

## 2012-05-30 DIAGNOSIS — G4730 Sleep apnea, unspecified: Secondary | ICD-10-CM | POA: Diagnosis not present

## 2012-08-18 DIAGNOSIS — Z23 Encounter for immunization: Secondary | ICD-10-CM | POA: Diagnosis not present

## 2012-09-12 ENCOUNTER — Other Ambulatory Visit: Payer: Self-pay | Admitting: Internal Medicine

## 2012-09-21 ENCOUNTER — Other Ambulatory Visit (INDEPENDENT_AMBULATORY_CARE_PROVIDER_SITE_OTHER): Payer: Medicare Other

## 2012-09-21 DIAGNOSIS — E119 Type 2 diabetes mellitus without complications: Secondary | ICD-10-CM | POA: Diagnosis not present

## 2012-09-21 DIAGNOSIS — I10 Essential (primary) hypertension: Secondary | ICD-10-CM | POA: Diagnosis not present

## 2012-09-21 LAB — BASIC METABOLIC PANEL
BUN: 17 mg/dL (ref 6–23)
CO2: 28 mEq/L (ref 19–32)
Calcium: 9.1 mg/dL (ref 8.4–10.5)
Chloride: 105 mEq/L (ref 96–112)
Creatinine, Ser: 1 mg/dL (ref 0.4–1.5)
GFR: 75.17 mL/min (ref >60.00)
Glucose, Bld: 109 mg/dL — ABNORMAL HIGH (ref 70–99)
Potassium: 4.5 mEq/L (ref 3.5–5.1)
Sodium: 140 mEq/L (ref 135–145)

## 2012-09-21 LAB — HEMOGLOBIN A1C: Hgb A1c MFr Bld: 6.4 % (ref 4.6–6.5)

## 2012-09-22 ENCOUNTER — Other Ambulatory Visit: Payer: Medicare Other

## 2012-09-26 ENCOUNTER — Encounter: Payer: Self-pay | Admitting: Internal Medicine

## 2012-09-26 ENCOUNTER — Ambulatory Visit (INDEPENDENT_AMBULATORY_CARE_PROVIDER_SITE_OTHER): Payer: Medicare Other | Admitting: Internal Medicine

## 2012-09-26 VITALS — BP 152/72 | HR 88 | Temp 97.5°F | Wt 220.0 lb

## 2012-09-26 DIAGNOSIS — E119 Type 2 diabetes mellitus without complications: Secondary | ICD-10-CM | POA: Diagnosis not present

## 2012-09-26 DIAGNOSIS — I10 Essential (primary) hypertension: Secondary | ICD-10-CM | POA: Diagnosis not present

## 2012-09-26 DIAGNOSIS — R0602 Shortness of breath: Secondary | ICD-10-CM

## 2012-09-26 MED ORDER — ASPIRIN 81 MG PO TABS: 81.0000 mg | ORAL_TABLET | Freq: Every day | ORAL | Status: AC

## 2012-09-26 MED ORDER — GLUCOSAMINE-CHONDROITIN 1500-1200 MG/30ML PO LIQD: ORAL | Status: DC

## 2012-09-26 MED ORDER — FEXOFENADINE HCL 180 MG PO TABS: 180.0000 mg | ORAL_TABLET | Freq: Every day | ORAL | Status: DC

## 2012-09-26 MED ORDER — ZOSTER VACCINE LIVE 19400 UNT/0.65ML ~~LOC~~ SOLR: 0.6500 mL | Freq: Once | SUBCUTANEOUS | Status: DC

## 2012-09-26 MED ORDER — METOPROLOL SUCCINATE ER 25 MG PO TB24: 25.0000 mg | ORAL_TABLET | Freq: Every day | ORAL | Status: DC

## 2012-09-26 MED ORDER — OMEPRAZOLE 20 MG PO CPDR: 20.0000 mg | DELAYED_RELEASE_CAPSULE | Freq: Two times a day (BID) | ORAL | Status: DC

## 2012-09-26 MED ORDER — AMLODIPINE BESYLATE 5 MG PO TABS: 5.0000 mg | ORAL_TABLET | Freq: Every day | ORAL | Status: DC

## 2012-09-26 MED ORDER — OLMESARTAN MEDOXOMIL 40 MG PO TABS: ORAL_TABLET | ORAL | Status: DC

## 2012-09-26 MED ORDER — ATORVASTATIN CALCIUM 20 MG PO TABS: 20.0000 mg | ORAL_TABLET | Freq: Every day | ORAL | Status: DC

## 2012-09-26 MED ORDER — CALCIUM CARBONATE-VITAMIN D 500-400 MG-UNIT PO TABS: 1.0000 | ORAL_TABLET | Freq: Every day | ORAL | Status: DC

## 2012-09-26 MED ORDER — ZOLPIDEM TARTRATE ER 6.25 MG PO TBCR: 6.2500 mg | EXTENDED_RELEASE_TABLET | Freq: Every evening | ORAL | Status: DC | PRN

## 2012-09-26 MED ORDER — FINASTERIDE 5 MG PO TABS: 5.0000 mg | ORAL_TABLET | Freq: Every day | ORAL | Status: DC

## 2012-09-26 MED ORDER — CARVEDILOL 6.25 MG PO TABS: 6.2500 mg | ORAL_TABLET | Freq: Two times a day (BID) | ORAL | Status: DC

## 2012-09-26 MED ORDER — GLIMEPIRIDE 1 MG PO TABS: 1.0000 mg | ORAL_TABLET | Freq: Two times a day (BID) | ORAL | Status: DC

## 2012-09-26 MED ORDER — COENZYME Q10 150 MG PO CAPS: 1.0000 | ORAL_CAPSULE | Freq: Every day | ORAL | Status: DC

## 2012-09-26 MED ORDER — DOXAZOSIN MESYLATE 1 MG PO TABS: 1.0000 mg | ORAL_TABLET | Freq: Every day | ORAL | Status: DC

## 2012-09-26 MED ORDER — METFORMIN HCL 500 MG PO TABS: 500.0000 mg | ORAL_TABLET | Freq: Two times a day (BID) | ORAL | Status: DC

## 2012-09-26 MED ORDER — GABAPENTIN 300 MG PO CAPS: 300.0000 mg | ORAL_CAPSULE | Freq: Every day | ORAL | Status: DC

## 2012-09-26 NOTE — Assessment & Plan Note (Signed)
EKG shows bradycardia at 52 beats per minute. Discontinue carvedilol. Switch to metoprolol XL 25 mg once daily.

## 2012-09-26 NOTE — Patient Instructions (Addendum)
Please complete the following lab tests before your next follow up appointment: BMET - 401.9 

## 2012-09-26 NOTE — Assessment & Plan Note (Signed)
71 year old white male with history of morbid obesity, type 2 diabetes and hypertension experiencing intermittent shortness of breath and orthopnea. Symptoms concerning for cardiac etiology. Refer to cardiology for further testing.

## 2012-09-26 NOTE — Assessment & Plan Note (Signed)
A1c is stable.  Continue metformin.   Lab Results  Component Value Date   HGBA1C 6.4 09/21/2012   Lab Results  Component Value Date   CREATININE 1.0 09/21/2012

## 2012-09-26 NOTE — Progress Notes (Signed)
Subjective:    Patient ID: Ian [name], male    DOB: [birth year], 71 y.o.   MRN: [id number]  HPI  71 year old white male with type 2 diabetes, hypertension and chronic insomnia for followup. Patient does not check his blood sugars at home. He denies any hypoglycemia. His weight has been fairly stable.  Patient complains of difficulty taking a deep breath. His symptoms have been intermittent for several months. Patient reports his symptoms can be worse with lying flat on his back. He denies any chest pain. He spends summers in [place]. He was seen by cardiologist in [place]. Medical records not available for review.  Hypertension-he didn't compliant with his antihypertensives. He does not monitor his blood pressure at home.   Review of Systems    Negative for chest pain Intermittent loose stools Past Medical History  Diagnosis Date  . Allergy   . Diabetes mellitus     type II  . Hyperlipidemia   . Hypertension   . Apnea, sleep 07/28/07    NPSG [place], AHI 79.9  . GERD (gastroesophageal reflux disease)   . Hypospadias   . Coronary heart disease 2008    mild at cath    History   Social History  . Marital Status: Married    Spouse Name: N/A    Number of Children: 3  . Years of Education: N/A   Occupational History  . Retired Holiday representative    Social History Main Topics  . Smoking status: Never Smoker   . Smokeless tobacco: Not on file  . Alcohol Use: No  . Drug Use:   . Sexually Active:    Other Topics Concern  . Not on file   Social History Narrative  . No narrative on file    Past Surgical History  Procedure Date  . Tonsillectomy   . Nissen fundoplication   . Cardiac catheterization 03/2007    Family History  Problem Relation Age of Onset  . Coronary artery disease      Allergies  Allergen Reactions  . Iohexol      Desc: pt. states severe reaction, dr. told patient never to have the dye again     Current Outpatient Prescriptions on File  Prior to Visit  Medication Sig Dispense Refill  . glucose blood (FREESTYLE LITE) test strip 1 each by Other route as needed. Use to test blood sugar three times a day as instructed.  Dx. 250.00       . halcinonide (HALOG) 0.1 % external solution Apply topically 2 (two) times daily.        Marland Kitchen LORazepam (ATIVAN) 0.5 MG tablet Take 1 tablet (0.5 mg total) by mouth at bedtime as needed.  90 tablet  1  . mometasone (NASONEX) 50 MCG/ACT nasal spray 2 sprays by Nasal route daily.  17 g  3  . omega-3 acid ethyl esters (LOVAZA) 1 G capsule Take 2 capsules (2 g total) by mouth 2 (two) times daily.  360 capsule  1  . Vitamins-Lipotropics (B-50) TABS Take 1 tablet by mouth daily.        . [DISCONTINUED] amLODipine (NORVASC) 5 MG tablet Take 1 tablet (5 mg total) by mouth daily.  90 tablet  1  . [DISCONTINUED] atorvastatin (LIPITOR) 20 MG tablet Take 1 tablet (20 mg total) by mouth daily.  90 tablet  1  . [DISCONTINUED] doxazosin (CARDURA) 1 MG tablet Take 1 tablet (1 mg total) by mouth at bedtime.  90 tablet  1  . [DISCONTINUED]  fexofenadine (ALLEGRA) 180 MG tablet Take 180 mg by mouth daily.        . [DISCONTINUED] gabapentin (NEURONTIN) 300 MG capsule Take 1 capsule (300 mg total) by mouth at bedtime. One to two daily prn  90 capsule  1  . [DISCONTINUED] glimepiride (AMARYL) 1 MG tablet Take 1 tablet (1 mg total) by mouth 2 (two) times daily.  180 tablet  1  . [DISCONTINUED] metFORMIN (GLUCOPHAGE) 500 MG tablet Take 1 tablet (500 mg total) by mouth 2 (two) times daily with a meal.  180 tablet  3  . [DISCONTINUED] olmesartan (BENICAR) 20 MG tablet One tab once daily  90 tablet  1  . [DISCONTINUED] omeprazole (PRILOSEC) 20 MG capsule Take 1 capsule (20 mg total) by mouth 2 (two) times daily.  180 capsule  3  . calcium-vitamin D (CALCIUM 500+D) 500-400 MG-UNIT per tablet Take 1 tablet by mouth daily.  90 tablet  3  . metoprolol succinate (TOPROL-XL) 25 MG 24 hr tablet Take 1 tablet (25 mg total) by mouth daily.   30 tablet  0  . zolpidem (AMBIEN CR) 6.25 MG CR tablet Take 1 tablet (6.25 mg total) by mouth at bedtime as needed for sleep.  90 tablet  1  . [DISCONTINUED] doxazosin (CARDURA) 1 MG tablet TAKE 1 TABLET AT BEDTIME  90 tablet  2    BP 152/72  Pulse 88  Temp 97.5 F (36.4 C) (Oral)  Wt 220 lb (99.791 kg)  EKG shows sinus bradycardia at 52 beats per minute. Nonspecific T wave abnormality.  Objective:   Physical Exam  Constitutional: He is oriented to person, place, and time. He appears well-developed and well-nourished.  HENT:  Head: Normocephalic and atraumatic.  Eyes: EOM are normal. Pupils are equal, round, and reactive to light.  Neck: Neck supple.       No carotid bruit  Cardiovascular: Normal rate, regular rhythm and normal heart sounds.   Pulmonary/Chest: Effort normal and breath sounds normal. He has no wheezes.  Abdominal: Soft. Bowel sounds are normal. There is no tenderness.  Musculoskeletal:       Trace lower extremity edema bilaterally  Neurological: He is alert and oriented to person, place, and time. No cranial nerve deficit.  Skin: Skin is warm and dry.  Psychiatric: He has a normal mood and affect. His behavior is normal.          Assessment & Plan:

## 2012-09-28 DIAGNOSIS — H2510 Age-related nuclear cataract, unspecified eye: Secondary | ICD-10-CM | POA: Diagnosis not present

## 2012-09-28 DIAGNOSIS — H35039 Hypertensive retinopathy, unspecified eye: Secondary | ICD-10-CM | POA: Diagnosis not present

## 2012-09-28 DIAGNOSIS — H43399 Other vitreous opacities, unspecified eye: Secondary | ICD-10-CM | POA: Diagnosis not present

## 2012-09-28 DIAGNOSIS — H353190 Nonexudative age-related macular degeneration, unspecified eye, stage unspecified: Secondary | ICD-10-CM | POA: Diagnosis not present

## 2012-10-14 DIAGNOSIS — H40019 Open angle with borderline findings, low risk, unspecified eye: Secondary | ICD-10-CM | POA: Diagnosis not present

## 2012-10-14 DIAGNOSIS — H04129 Dry eye syndrome of unspecified lacrimal gland: Secondary | ICD-10-CM | POA: Diagnosis not present

## 2012-10-20 ENCOUNTER — Encounter: Payer: Self-pay | Admitting: Cardiology

## 2012-10-20 ENCOUNTER — Ambulatory Visit (INDEPENDENT_AMBULATORY_CARE_PROVIDER_SITE_OTHER): Payer: Medicare Other | Admitting: Cardiology

## 2012-10-20 VITALS — BP 140/75 | HR 76 | Ht 68.0 in | Wt 221.4 lb

## 2012-10-20 DIAGNOSIS — R0600 Dyspnea, unspecified: Secondary | ICD-10-CM

## 2012-10-20 DIAGNOSIS — I10 Essential (primary) hypertension: Secondary | ICD-10-CM | POA: Diagnosis not present

## 2012-10-20 DIAGNOSIS — E785 Hyperlipidemia, unspecified: Secondary | ICD-10-CM

## 2012-10-20 DIAGNOSIS — R0609 Other forms of dyspnea: Secondary | ICD-10-CM

## 2012-10-20 DIAGNOSIS — R05 Cough: Secondary | ICD-10-CM

## 2012-10-20 DIAGNOSIS — I2510 Atherosclerotic heart disease of native coronary artery without angina pectoris: Secondary | ICD-10-CM | POA: Diagnosis not present

## 2012-10-20 DIAGNOSIS — R0989 Other specified symptoms and signs involving the circulatory and respiratory systems: Secondary | ICD-10-CM

## 2012-10-20 DIAGNOSIS — R059 Cough, unspecified: Secondary | ICD-10-CM

## 2012-10-20 DIAGNOSIS — R0602 Shortness of breath: Secondary | ICD-10-CM

## 2012-10-20 NOTE — Progress Notes (Signed)
HPI The patient presents for evaluation of dyspnea. He has sensation of having a difficult time filling "the bottom of my lungs."  This does not occur constantly. It seems to happen more when he is lying down. He might have to get up and take a few deep breaths single back to sleep. It doesn't happen all the time. He's not describing PND or orthopnea. He sometimes has the sensation just sitting in a chair. He had it during this examination. However, he can exercise and not necessarily have this complaint. He might feel better with exercise. He has had chest discomfort in the past and nonobstructive disease on cath. I have reviewed all of his outside data. His last stress perfusion study in 2012 was negative for any evidence of ischemia. He does not describe weight gain or edema. He does not describe palpitations, presyncope or syncope. He does not report fevers chills or cough.  Allergies  Allergen Reactions  . Iohexol      Desc: pt. states severe reaction, dr. told patient never to have the dye again     Current Outpatient Prescriptions  Medication Sig Dispense Refill  . amLODipine (NORVASC) 5 MG tablet Take 1 tablet (5 mg total) by mouth daily.  90 tablet  3  . aspirin 81 MG tablet Take 1 tablet (81 mg total) by mouth daily.  90 tablet  3  . atorvastatin (LIPITOR) 20 MG tablet Take 1 tablet (20 mg total) by mouth daily.  90 tablet  3  . calcium-vitamin D (CALCIUM 500+D) 500-400 MG-UNIT per tablet Take 1 tablet by mouth daily.  90 tablet  3  . Coenzyme Q10 150 MG CAPS Take 1 capsule (150 mg total) by mouth daily.  90 capsule  3  . doxazosin (CARDURA) 1 MG tablet Take 1 tablet (1 mg total) by mouth at bedtime.  90 tablet  3  . fexofenadine (ALLEGRA) 180 MG tablet Take 1 tablet (180 mg total) by mouth daily.  90 tablet  3  . finasteride (PROSCAR) 5 MG tablet Take 1 tablet (5 mg total) by mouth daily.  90 tablet  3  . gabapentin (NEURONTIN) 300 MG capsule Take 1 capsule (300 mg total) by mouth  at bedtime. One to two daily prn  90 capsule  3  . glimepiride (AMARYL) 1 MG tablet Take 1 tablet (1 mg total) by mouth 2 (two) times daily.  180 tablet  3  . Glucosamine-Chondroitin 1500-1200 MG/30ML LIQD Twice a day  480 mL  12  . glucose blood (FREESTYLE LITE) test strip 1 each by Other route as needed. Use to test blood sugar three times a day as instructed.  Dx. 250.00       . halcinonide (HALOG) 0.1 % external solution Apply topically 2 (two) times daily.        Marland Kitchen LORazepam (ATIVAN) 0.5 MG tablet Take 1 tablet (0.5 mg total) by mouth at bedtime as needed.  90 tablet  1  . metFORMIN (GLUCOPHAGE) 500 MG tablet Take 1 tablet (500 mg total) by mouth 2 (two) times daily with a meal.  180 tablet  3  . metoprolol succinate (TOPROL-XL) 25 MG 24 hr tablet Take 25 mg by mouth 2 (two) times daily.      . mometasone (NASONEX) 50 MCG/ACT nasal spray 2 sprays by Nasal route daily.  17 g  3  . olmesartan (BENICAR) 40 MG tablet Two tab once daily      . omega-3 acid ethyl esters (LOVAZA)  1 G capsule Take 2 capsules (2 g total) by mouth 2 (two) times daily.  360 capsule  1  . omeprazole (PRILOSEC) 20 MG capsule Take 1 capsule (20 mg total) by mouth 2 (two) times daily.  180 capsule  3  . Vitamins-Lipotropics (B-50) TABS Take 1 tablet by mouth daily.        Marland Kitchen zolpidem (AMBIEN CR) 6.25 MG CR tablet Take 1 tablet (6.25 mg total) by mouth at bedtime as needed.  30 tablet  5  . zolpidem (AMBIEN CR) 6.25 MG CR tablet Take 1 tablet (6.25 mg total) by mouth at bedtime as needed for sleep.  90 tablet  1  . zoster vaccine live, PF, (ZOSTAVAX) 16109 UNT/0.65ML injection Inject 19,400 Units into the skin once.  1 each  0    Past Medical History  Diagnosis Date  . Allergy   . Diabetes mellitus     type II  . Hyperlipidemia   . Hypertension   . Apnea, sleep 07/28/07    NPSG [place], AHI 79.9  . GERD (gastroesophageal reflux disease)   . Hypospadias   . Coronary heart disease 2008    Cath 2008, 60-70% proximal  diagonal 1 stenosis,  RCA 40% stenosis.  nonischemic Lexiscan 03/2011  . Carotid artery stenosis     mild 04/2010, 05/2012    Past Surgical History  Procedure Date  . Tonsillectomy   . Nissen fundoplication   . Nasal sinus surgery   . Vein ligation and stripping     Family History  Problem Relation Age of Onset  . Coronary artery disease Father 73    CABG    History   Social History  . Marital Status: Married    Spouse Name: N/A    Number of Children: 3  . Years of Education: N/A   Occupational History  . Retired Holiday representative    Social History Main Topics  . Smoking status: Never Smoker   . Smokeless tobacco: Not on file  . Alcohol Use: No  . Drug Use:   . Sexually Active:    Other Topics Concern  . Not on file   Social History Narrative   Married.  Three children from first marriage.  First wife died of ovarian cancer.      ROS: Positive for sinus trouble, joint pains, rhinitis. Otherwise as stated in the history of present illness and negative for all other systems.   PHYSICAL EXAM BP 140/75  Pulse 76  Ht 5\' 8"  (1.727 m)  Wt 221 lb 6.4 oz (100.426 kg)  BMI 33.66 kg/m2 GENERAL:  Well appearing HEENT:  Pupils equal round and reactive, fundi not visualized, oral mucosa unremarkable NECK:  No jugular venous distention, waveform within normal limits, carotid upstroke brisk and symmetric, no bruits, no thyromegaly LYMPHATICS:  No cervical, inguinal adenopathy LUNGS:  Clear to auscultation bilaterally BACK:  No CVA tenderness CHEST:  Unremarkable HEART:  PMI not displaced or sustained,S1 and S2 within normal limits, no S3, no S4, no clicks, no rubs, no murmurs ABD:  Flat, positive bowel sounds normal in frequency in pitch, no bruits, no rebound, no guarding, no midline pulsatile mass, no hepatomegaly, no splenomegaly EXT:  2 plus pulses throughout, no edema, no cyanosis no clubbing SKIN:  No rashes no nodules NEURO:  Cranial nerves II through XII grossly  intact, motor grossly intact throughout PSYCH:  Cognitively intact, oriented to person place and time   EKG:  Probable ectopic atrial rhythm, incomplete right bundle  branch block, no acute ST-T wave changes. 10/20/2012  ASSESSMENT AND PLAN  Dyspnea - I do not suspect that this is congestive heart failure. I will check a BNP level. However, if this is normal no further cardiac workup will be suggested. He does request referral to a pulmonologist and I will arrange this. It is a small possibility of a restrictive physiology with possible diaphragmatic or neuromuscular abnormality.  Coronary artery disease - This has been mild. At this point no further cardiovascular testing is suggested. I think he can continue with risk reduction.  Carotid stenosis - He has had mild carotid stenosis and will likely need followup Dopplers in about 2 years.  Ectopic atrial rhythm - I don't think he is having any symptoms related to this. No change in therapy is indicated.

## 2012-10-20 NOTE — Patient Instructions (Addendum)
The current medical regimen is effective;  continue present plan and medications.  Please have blood work today (BNP)   You have been referred to Dr Delton Coombes in Pulmonary for dyspnea.  Follow up in 1 year with Dr Antoine Poche.  You will receive a letter in the mail 2 months before you are due.  Please call us when you receive this letter to schedule your follow up appointment.

## 2012-10-21 LAB — BRAIN NATRIURETIC PEPTIDE: Pro B Natriuretic peptide (BNP): 77 pg/mL (ref 0.0–100.0)

## 2012-10-24 ENCOUNTER — Encounter: Payer: Self-pay | Admitting: Cardiology

## 2012-10-26 ENCOUNTER — Ambulatory Visit (INDEPENDENT_AMBULATORY_CARE_PROVIDER_SITE_OTHER): Payer: Medicare Other | Admitting: Internal Medicine

## 2012-10-26 VITALS — BP 154/82 | HR 76 | Temp 98.0°F | Wt 222.0 lb

## 2012-10-26 DIAGNOSIS — I10 Essential (primary) hypertension: Secondary | ICD-10-CM

## 2012-10-26 DIAGNOSIS — R0602 Shortness of breath: Secondary | ICD-10-CM

## 2012-10-26 MED ORDER — DOXAZOSIN MESYLATE 2 MG PO TABS: 2.0000 mg | ORAL_TABLET | Freq: Every day | ORAL | Status: DC

## 2012-10-26 MED ORDER — OLMESARTAN MEDOXOMIL 40 MG PO TABS: 40.0000 mg | ORAL_TABLET | Freq: Every day | ORAL | Status: DC

## 2012-10-26 NOTE — Assessment & Plan Note (Addendum)
Medication reconciliation performed. Patient advised increase dose of Benicar to  40 mg once daily. Reassess in one month. BP: 154/82 mmHg  Lab Results  Component Value Date   CREATININE 1.0 09/21/2012

## 2012-10-26 NOTE — Patient Instructions (Addendum)
Please complete the following lab tests before your next follow up appointment: BMET - 401.9 A1c - 250.00 

## 2012-10-26 NOTE — Progress Notes (Signed)
Subjective:    Patient ID: Ian [name], male    DOB: 02-16-1941, 71 y.o.   MRN: [id number]  HPI  71 year old white male with history of type 2 diabetes, hypertension and coronary artery disease for followup. He was seen by cardiology-Dr. Antoine Poche. Cardiologist did not feel his shortness of breath secondary to cardiac etiology. He has history of right diaphragm elevation. He complains that he feels like he can't inhale a deep breath. His symptoms worse when is lays down.  BNP was normal.  He was referred to pulmonary for further evaluation.  Medication reconciliation performed in detail.  Hypertension - home BP readings in 140's.  Review of Systems Weight is stable  Past Medical History  Diagnosis Date  . Allergy   . Diabetes mellitus     type II  . Hyperlipidemia   . Hypertension   . Apnea, sleep 07/28/07    NPSG [place], AHI 79.9  . GERD (gastroesophageal reflux disease)   . Hypospadias   . Coronary heart disease 2008    Cath 2008, 60-70% proximal diagonal 1 stenosis,  RCA 40% stenosis.  nonischemic Lexiscan 03/2011  . Carotid artery stenosis     mild 04/2010, 05/2012    History   Social History  . Marital Status: Married    Spouse Name: N/A    Number of Children: 3  . Years of Education: N/A   Occupational History  . Retired Holiday representative    Social History Main Topics  . Smoking status: Never Smoker   . Smokeless tobacco: Not on file  . Alcohol Use: No  . Drug Use:   . Sexually Active:    Other Topics Concern  . Not on file   Social History Narrative   Married.  Three children from first marriage.  First wife died of ovarian cancer.      Past Surgical History  Procedure Date  . Tonsillectomy   . Nissen fundoplication   . Nasal sinus surgery   . Vein ligation and stripping     Family History  Problem Relation Age of Onset  . Coronary artery disease Father 2    CABG    Allergies  Allergen Reactions  . Iohexol      Desc: pt. states severe  reaction, dr. told patient never to have the dye again     Current Outpatient Prescriptions on File Prior to Visit  Medication Sig Dispense Refill  . amLODipine (NORVASC) 5 MG tablet Take 1 tablet (5 mg total) by mouth daily.  90 tablet  3  . aspirin 81 MG tablet Take 1 tablet (81 mg total) by mouth daily.  90 tablet  3  . atorvastatin (LIPITOR) 20 MG tablet Take 1 tablet (20 mg total) by mouth daily.  90 tablet  3  . calcium-vitamin D (CALCIUM 500+D) 500-400 MG-UNIT per tablet Take 1 tablet by mouth daily.  90 tablet  3  . Coenzyme Q10 150 MG CAPS Take 1 capsule (150 mg total) by mouth daily.  90 capsule  3  . doxazosin (CARDURA) 2 MG tablet Take 1 tablet (2 mg total) by mouth at bedtime.  90 tablet  3  . fexofenadine (ALLEGRA) 180 MG tablet Take 1 tablet (180 mg total) by mouth daily.  90 tablet  3  . finasteride (PROSCAR) 5 MG tablet Take 1 tablet (5 mg total) by mouth daily.  90 tablet  3  . gabapentin (NEURONTIN) 300 MG capsule Take 1 capsule (300 mg total) by mouth at  bedtime. One to two daily prn  90 capsule  3  . glimepiride (AMARYL) 1 MG tablet Take 1 tablet (1 mg total) by mouth 2 (two) times daily.  180 tablet  3  . Glucosamine-Chondroitin 1500-1200 MG/30ML LIQD Twice a day  480 mL  12  . glucose blood (FREESTYLE LITE) test strip 1 each by Other route as needed. Use to test blood sugar three times a day as instructed.  Dx. 250.00       . halcinonide (HALOG) 0.1 % external solution Apply topically 2 (two) times daily.        Marland Kitchen LORazepam (ATIVAN) 0.5 MG tablet Take 1 tablet (0.5 mg total) by mouth at bedtime as needed.  90 tablet  1  . metFORMIN (GLUCOPHAGE) 500 MG tablet Take 1 tablet (500 mg total) by mouth 2 (two) times daily with a meal.  180 tablet  3  . metoprolol succinate (TOPROL-XL) 25 MG 24 hr tablet Take 1 tablet (25 mg total) by mouth daily.      . mometasone (NASONEX) 50 MCG/ACT nasal spray 2 sprays by Nasal route daily.  17 g  3  . olmesartan (BENICAR) 40 MG tablet Take  1 tablet (40 mg total) by mouth daily.  90 tablet  1  . omeprazole (PRILOSEC) 20 MG capsule Take 1 capsule (20 mg total) by mouth 2 (two) times daily.  180 capsule  3  . Vitamins-Lipotropics (B-50) TABS Take 1 tablet by mouth daily.        Marland Kitchen zolpidem (AMBIEN CR) 6.25 MG CR tablet Take 1 tablet (6.25 mg total) by mouth at bedtime as needed.  30 tablet  5    BP 154/82  Pulse 76  Temp 98 F (36.7 C) (Oral)  Wt 222 lb (100.699 kg)       Objective:   Physical Exam  Constitutional: He is oriented to person, place, and time. He appears well-developed and well-nourished.  HENT:  Head: Normocephalic and atraumatic.  Cardiovascular: Normal rate, regular rhythm and normal heart sounds.   Pulmonary/Chest: Effort normal and breath sounds normal. He has no wheezes.  Musculoskeletal: He exhibits no edema.  Neurological: He is alert and oriented to person, place, and time.  Skin: Skin is warm and dry.          Assessment & Plan:

## 2012-10-26 NOTE — Assessment & Plan Note (Signed)
Appreciate cardiac evaluation. Dr. Antoine Poche feels patient's symptoms not secondary to cardiac etiology. He has history of right diaphragm elevation of unclear etiology. This was seen on CT scan of chest performed in 2012. I agree with further pulmonary evaluation. He is referred to Dr. Delton Coombes.

## 2012-11-15 ENCOUNTER — Encounter: Payer: Self-pay | Admitting: Emergency Medicine

## 2012-11-15 ENCOUNTER — Ambulatory Visit (INDEPENDENT_AMBULATORY_CARE_PROVIDER_SITE_OTHER): Payer: Medicare Other | Admitting: Emergency Medicine

## 2012-11-15 VITALS — BP 122/62 | HR 69 | Temp 97.1°F | Ht 67.0 in | Wt 226.8 lb

## 2012-11-15 DIAGNOSIS — G4730 Sleep apnea, unspecified: Secondary | ICD-10-CM

## 2012-11-15 DIAGNOSIS — R0602 Shortness of breath: Secondary | ICD-10-CM

## 2012-11-15 DIAGNOSIS — R059 Cough, unspecified: Secondary | ICD-10-CM

## 2012-11-15 DIAGNOSIS — R05 Cough: Secondary | ICD-10-CM | POA: Diagnosis not present

## 2012-11-15 NOTE — Assessment & Plan Note (Signed)
Uses an oral appliance, has never been able to tolerate CPAP. If he could wear CPAP, it very well may help with what I believe is largely restrictive lung disease.

## 2012-11-15 NOTE — Patient Instructions (Addendum)
We will perform full pulmonary function testing at your next office visit Continue your allegra and nasonex as you are taking them You may want to try using nasal saline washes once a day to better treat any component of sinus disease contributing to your cough Follow with Dr Delton Coombes next available appointment with PFT

## 2012-11-15 NOTE — Assessment & Plan Note (Signed)
Very pleasant gentleman with progressive dyspnea. The description sounds like restrictive disease - worse when sitting or supine, better when exerting and when taking an intentional deep breath. Most likely explanation is his elevated R hemidiaphragm. Not clear whether this is truly paralyzed or is elevated due to post-op changes from Nissen. 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 This is usually asymptomatic, but could certainly cause restriction. He doesn't have ILD or any other explanation by CT scan. I suppose it is conceivable that this could be addressed by a general surgeon, depending on how bothersome this is.  - I will check full PFT to r/o obstruction, assess degree of his restriction. Then f/u to discuss our options.

## 2012-11-15 NOTE — Progress Notes (Signed)
Subjective:    Patient ID: Ian [name], male    DOB: 31-Jan-1941, 72 y.o.   MRN: [id number]  HPI 72 yo never smoker, hx of severe OSA (dx '08) , CAD, DM2, HTN, allergies. Followed by Dr Antoine Poche, his last stress perfusion study in 2012 was negative for any evidence of ischemia. He is referred for dyspnea. He describes having difficulty getting a deep breath, often worst at night when laying down (he sleeps prone, notices it both supine and prone). Better if he stands. Better when he exercises. He is unable to use CPAP, uses an oral appliance.  Also c/o cough, comes and goes but never completely gone. Non-productive. No persistent PND when he is on allegra and nasonex. No GERD sx since his Nissen. He has rare episodes of aspiration when he drinks thin liquids.   CXR 2/13 with an elevated R HD. Confirmed on Ct scan with a small HH post-Nissen   Review of Systems  Constitutional: Negative for fever and unexpected weight change.  HENT: Positive for congestion, postnasal drip and sinus pressure. Negative for ear pain, nosebleeds, sore throat, rhinorrhea, sneezing, trouble swallowing and dental problem.   Eyes: Negative for redness and itching.  Respiratory: Positive for cough and shortness of breath. Negative for chest tightness and wheezing.   Cardiovascular: Negative for palpitations and leg swelling.  Gastrointestinal: Negative for nausea and vomiting.  Genitourinary: Negative for dysuria.  Musculoskeletal: Negative for joint swelling.  Skin: Negative for rash.  Neurological: Negative for headaches.  Hematological: Does not bruise/bleed easily.  Psychiatric/Behavioral: Negative for dysphoric mood. The patient is not nervous/anxious.    Past Medical History  Diagnosis Date  . Allergy   . Diabetes mellitus     type II  . Hyperlipidemia   . Hypertension   . Apnea, sleep 07/28/07    NPSG [place], AHI 79.9  . GERD (gastroesophageal reflux disease)   . Hypospadias   . Coronary heart  disease 2008    Cath 2008, 60-70% proximal diagonal 1 stenosis,  RCA 40% stenosis.  nonischemic Lexiscan 03/2011  . Carotid artery stenosis     mild 04/2010, 05/2012  . Sleep apnea      Family History  Problem Relation Age of Onset  . Coronary artery disease Father 40    CABG     History   Social History  . Marital Status: Married    Spouse Name: N/A    Number of Children: 3  . Years of Education: N/A   Occupational History  . Retired Holiday representative    Social History Main Topics  . Smoking status: Never Smoker   . Smokeless tobacco: Never Used  . Alcohol Use: No  . Drug Use: No  . Sexually Active: Not on file   Other Topics Concern  . Not on file   Social History Narrative   Married.  Three children from first marriage.  First wife died of ovarian cancer.       Allergies  Allergen Reactions  . Iohexol      Desc: pt. states severe reaction, dr. told patient never to have the dye again      Outpatient Prescriptions Prior to Visit  Medication Sig Dispense Refill  . amLODipine (NORVASC) 5 MG tablet Take 1 tablet (5 mg total) by mouth daily.  90 tablet  3  . aspirin 81 MG tablet Take 1 tablet (81 mg total) by mouth daily.  90 tablet  3  . atorvastatin (LIPITOR) 20 MG tablet Take  1 tablet (20 mg total) by mouth daily.  90 tablet  3  . calcium-vitamin D (CALCIUM 500+D) 500-400 MG-UNIT per tablet Take 1 tablet by mouth daily.  90 tablet  3  . Coenzyme Q10 150 MG CAPS Take 1 capsule (150 mg total) by mouth daily.  90 capsule  3  . doxazosin (CARDURA) 2 MG tablet Take 1 tablet (2 mg total) by mouth at bedtime.  90 tablet  3  . fexofenadine (ALLEGRA) 180 MG tablet Take 1 tablet (180 mg total) by mouth daily.  90 tablet  3  . finasteride (PROSCAR) 5 MG tablet Take 1 tablet (5 mg total) by mouth daily.  90 tablet  3  . gabapentin (NEURONTIN) 300 MG capsule Take 1 capsule (300 mg total) by mouth at bedtime. One to two daily prn  90 capsule  3  . glimepiride (AMARYL) 1 MG tablet  Take 1 tablet (1 mg total) by mouth 2 (two) times daily.  180 tablet  3  . Glucosamine-Chondroitin 1500-1200 MG/30ML LIQD Twice a day  480 mL  12  . glucose blood (FREESTYLE LITE) test strip 1 each by Other route as needed. Use to test blood sugar three times a day as instructed.  Dx. 250.00       . halcinonide (HALOG) 0.1 % external solution Apply topically 2 (two) times daily.        Marland Kitchen LORazepam (ATIVAN) 0.5 MG tablet Take 1 tablet (0.5 mg total) by mouth at bedtime as needed.  90 tablet  1  . metFORMIN (GLUCOPHAGE) 500 MG tablet Take 1 tablet (500 mg total) by mouth 2 (two) times daily with a meal.  180 tablet  3  . metoprolol succinate (TOPROL-XL) 25 MG 24 hr tablet Take 1 tablet (25 mg total) by mouth daily.      . mometasone (NASONEX) 50 MCG/ACT nasal spray 2 sprays by Nasal route daily.  17 g  3  . olmesartan (BENICAR) 40 MG tablet Take 1 tablet (40 mg total) by mouth daily.  90 tablet  1  . omeprazole (PRILOSEC) 20 MG capsule Take 1 capsule (20 mg total) by mouth 2 (two) times daily.  180 capsule  3  . Vitamins-Lipotropics (B-50) TABS Take 1 tablet by mouth daily.        Marland Kitchen zolpidem (AMBIEN CR) 6.25 MG CR tablet Take 1 tablet (6.25 mg total) by mouth at bedtime as needed.  30 tablet  5   Last reviewed on 11/15/2012  3:07 PM by Lazarus Salines, RN       Objective:   Physical Exam Filed Vitals:   11/15/12 1507  BP: 122/62  Pulse: 69  Temp: 97.1 F (36.2 C)   Gen: Pleasant, well-nourished, in no distress,  normal affect  ENT: No lesions,  mouth clear,  oropharynx clear, no postnasal drip  Neck: No JVD, no TMG, no carotid bruits  Lungs: No use of accessory muscles, no dullness to percussion, clear without rales or rhonchi  Cardiovascular: RRR, heart sounds normal, no murmur or gallops, no peripheral edema  Abdomen: soft and NT, no HSM,  BS normal  Musculoskeletal: No deformities, no cyanosis or clubbing  Neuro: alert, non focal  Skin: Warm, no lesions or rashes      Assessment & Plan:

## 2012-11-15 NOTE — Assessment & Plan Note (Signed)
Shouldn;t be having GERD after Nissen. Will try to treat sinus dz aggressively to see if he benefits - add NSW to his allegra and nasal steroid

## 2012-11-15 NOTE — Addendum Note (Signed)
Addended by: Leslye Peer on: 11/15/2012 04:10 PM   Modules accepted: Level of Service

## 2012-12-07 ENCOUNTER — Other Ambulatory Visit: Payer: Self-pay | Admitting: Internal Medicine

## 2012-12-09 ENCOUNTER — Ambulatory Visit (INDEPENDENT_AMBULATORY_CARE_PROVIDER_SITE_OTHER): Payer: Medicare Other | Admitting: Emergency Medicine

## 2012-12-09 ENCOUNTER — Encounter: Payer: Self-pay | Admitting: Emergency Medicine

## 2012-12-09 VITALS — BP 122/62 | HR 62 | Temp 97.1°F | Ht 68.0 in | Wt 224.0 lb

## 2012-12-09 DIAGNOSIS — R0602 Shortness of breath: Secondary | ICD-10-CM

## 2012-12-09 LAB — PULMONARY FUNCTION TEST

## 2012-12-09 MED ORDER — MOMETASONE FUROATE 50 MCG/ACT NA SUSP: 2.0000 | Freq: Every day | NASAL | Status: DC

## 2012-12-09 NOTE — Progress Notes (Signed)
  Subjective:    Patient ID: Ian [name], male    DOB: 09-23-1941, 72 y.o.   MRN: [id number]  HPI 72 yo never smoker, hx of severe OSA (dx '08) , CAD, DM2, HTN, allergies. Followed by Dr Antoine Poche, his last stress perfusion study in 2012 was negative for any evidence of ischemia. He is referred for dyspnea. He describes having difficulty getting a deep breath, often worst at night when laying down (he sleeps prone, notices it both supine and prone). Better if he stands. Better when he exercises. He is unable to use CPAP, uses an oral appliance.  Also c/o cough, comes and goes but never completely gone. Non-productive. No persistent PND when he is on allegra and nasonex. No GERD sx since his Nissen. He has rare episodes of aspiration when he drinks thin liquids.   CXR 2/13 with an elevated R HD. Confirmed on Ct scan with a small HH post-Nissen  ROV 12/09/12 -- follows up for dyspnea. Underwent PFT today, grossly normal airflows (possibly some subtle AFL) but with flattening of the inspiratory loop that could suggest a variable UA obstruction. His nasal congestion has improved, didn't need to do the [place]. ? Whether this reflects vocal cord paralysis or an UA lesion.        Objective:   Physical Exam Filed Vitals:   12/09/12 1101  BP: 122/62  Pulse: 62  Temp: 97.1 F (36.2 C)   Gen: Pleasant, well-nourished, in no distress,  normal affect  ENT: No lesions,  mouth clear,  oropharynx clear, no postnasal drip  Neck: No JVD, no TMG, no carotid bruits  Lungs: No use of accessory muscles, no dullness to percussion, clear without rales or rhonchi  Cardiovascular: RRR, heart sounds normal, no murmur or gallops, no peripheral edema  Musculoskeletal: No deformities, no cyanosis or clubbing  Neuro: alert, non focal  Skin: Warm, no lesions or rashes     Assessment & Plan:  Shortness of breath Suspect this is restriction due to abdominal compliance. Note his flat inspiratory loop on  spirometry. Could relate to VC dysfxn (he does have some sx of this). I discussed possibility of a polyp or other variable extrathoracic obstruction. We could do FOB to eval - at this point he wants to wait, see if sx evolve before committing to a procedure - rov 6 months

## 2012-12-09 NOTE — Assessment & Plan Note (Signed)
Suspect this is restriction due to abdominal compliance. Note his flat inspiratory loop on spirometry. Could relate to VC dysfxn (he does have some sx of this). I discussed possibility of a polyp or other variable extrathoracic obstruction. We could do FOB to eval - at this point he wants to wait, see if sx evolve before committing to a procedure - rov 6 months

## 2012-12-09 NOTE — Addendum Note (Signed)
Addended by: Orma Flaming D on: 12/09/2012 11:57 AM   Modules accepted: Orders

## 2012-12-09 NOTE — Patient Instructions (Addendum)
Your spirometry shows some possible limitation of airflow on inspiration. We may want to consider evaluating this further at some point in the future, especially if your breathing changes Follow with Dr Delton Coombes in 6 months or sooner if you have any problems

## 2012-12-09 NOTE — Progress Notes (Signed)
PFT done today. 

## 2012-12-28 ENCOUNTER — Telehealth: Payer: Self-pay | Admitting: Emergency Medicine

## 2012-12-28 NOTE — Telephone Encounter (Signed)
I spoke with pt and he is wanting to know who our docs rec for oral appliance. I advised him normally our docs recommend Dr. Myrtis Ser. He voiced his understanding and needed nothing further

## 2012-12-29 ENCOUNTER — Telehealth: Payer: Self-pay | Admitting: Emergency Medicine

## 2012-12-29 DIAGNOSIS — G4733 Obstructive sleep apnea (adult) (pediatric): Secondary | ICD-10-CM

## 2012-12-29 NOTE — Telephone Encounter (Signed)
RB, please advise if you would like to refer the pt to Dr. Myrtis Ser for oral appliance, thanks!

## 2012-12-30 NOTE — Telephone Encounter (Signed)
The pt is aware we have sent the order to the [facility] and they will call once an appt has been scheduled for this. Pt verbalized understanding.

## 2012-12-30 NOTE — Telephone Encounter (Signed)
Ok to make this referral.

## 2013-01-30 ENCOUNTER — Encounter: Payer: Self-pay | Admitting: Internal Medicine

## 2013-01-30 ENCOUNTER — Ambulatory Visit (INDEPENDENT_AMBULATORY_CARE_PROVIDER_SITE_OTHER): Payer: Medicare Other | Admitting: Internal Medicine

## 2013-01-30 VITALS — BP 126/66 | HR 68 | Temp 97.7°F | Wt 224.0 lb

## 2013-01-30 DIAGNOSIS — I10 Essential (primary) hypertension: Secondary | ICD-10-CM

## 2013-01-30 DIAGNOSIS — E119 Type 2 diabetes mellitus without complications: Secondary | ICD-10-CM | POA: Diagnosis not present

## 2013-01-30 DIAGNOSIS — E785 Hyperlipidemia, unspecified: Secondary | ICD-10-CM | POA: Diagnosis not present

## 2013-01-30 LAB — BASIC METABOLIC PANEL
BUN: 21 mg/dL (ref 6–23)
CO2: 27 mEq/L (ref 19–32)
Calcium: 9.3 mg/dL (ref 8.4–10.5)
Chloride: 102 mEq/L (ref 96–112)
Creatinine, Ser: 1 mg/dL (ref 0.4–1.5)
GFR: 78.61 mL/min (ref >60.00)
Glucose, Bld: 92 mg/dL (ref 70–99)
Potassium: 4.5 mEq/L (ref 3.5–5.1)
Sodium: 140 mEq/L (ref 135–145)

## 2013-01-30 LAB — LIPID PANEL
Cholesterol: 172 mg/dL (ref 0–200)
HDL: 35.2 mg/dL — ABNORMAL LOW (ref >39.00)
LDL Cholesterol: 105 mg/dL — ABNORMAL HIGH (ref 0–99)
Total CHOL/HDL Ratio: 5
Triglycerides: 159 mg/dL — ABNORMAL HIGH (ref 0.0–149.0)
VLDL: 31.8 mg/dL (ref 0.0–40.0)

## 2013-01-30 LAB — HEPATIC FUNCTION PANEL
ALT: 21 U/L (ref 0–53)
AST: 24 U/L (ref 0–37)
Albumin: 3.7 g/dL (ref 3.5–5.2)
Alkaline Phosphatase: 47 U/L (ref 39–117)
Bilirubin, Direct: 0.1 mg/dL (ref 0.0–0.3)
Total Bilirubin: 1.1 mg/dL (ref 0.3–1.2)
Total Protein: 7.4 g/dL (ref 6.0–8.3)

## 2013-01-30 LAB — TSH: TSH: 1.03 u[IU]/mL (ref 0.35–5.50)

## 2013-01-30 LAB — HEMOGLOBIN A1C: Hgb A1c MFr Bld: 6.5 % (ref 4.6–6.5)

## 2013-01-30 MED ORDER — METFORMIN HCL 500 MG PO TABS: 500.0000 mg | ORAL_TABLET | Freq: Two times a day (BID) | ORAL | Status: DC

## 2013-01-30 MED ORDER — METOPROLOL SUCCINATE ER 25 MG PO TB24: 25.0000 mg | ORAL_TABLET | Freq: Every day | ORAL | Status: DC

## 2013-01-30 MED ORDER — DOXAZOSIN MESYLATE 2 MG PO TABS: 2.0000 mg | ORAL_TABLET | Freq: Every day | ORAL | Status: DC

## 2013-01-30 MED ORDER — OMEPRAZOLE 20 MG PO CPDR: 20.0000 mg | DELAYED_RELEASE_CAPSULE | Freq: Two times a day (BID) | ORAL | Status: DC

## 2013-01-30 MED ORDER — LORAZEPAM 0.5 MG PO TABS: 0.5000 mg | ORAL_TABLET | Freq: Every evening | ORAL | Status: DC | PRN

## 2013-01-30 MED ORDER — ZOLPIDEM TARTRATE 5 MG PO TABS: 5.0000 mg | ORAL_TABLET | Freq: Every evening | ORAL | Status: DC | PRN

## 2013-01-30 MED ORDER — FINASTERIDE 5 MG PO TABS: 5.0000 mg | ORAL_TABLET | Freq: Every day | ORAL | Status: DC

## 2013-01-30 MED ORDER — OLMESARTAN MEDOXOMIL 40 MG PO TABS: 40.0000 mg | ORAL_TABLET | Freq: Every day | ORAL | Status: DC

## 2013-01-30 MED ORDER — AMLODIPINE BESYLATE 5 MG PO TABS: 5.0000 mg | ORAL_TABLET | Freq: Every day | ORAL | Status: DC

## 2013-01-30 NOTE — Assessment & Plan Note (Addendum)
Stable.  Patient rarely monitors blood sugars. Check A1c.  Continue same dose of metformin and glyburide.  Patient has diabetic eye exam scheduled with Dr. Delrae Sawyers.

## 2013-01-30 NOTE — Progress Notes (Signed)
Subjective:    Patient ID: Ian [name], male    DOB: [birth year], 72 y.o.   MRN: [id number]  HPI  72 year old white male with history of type 2 diabetes, hypertension and coronary artery disease for followup. Since previous visit, patient seen by pulmonary specialists. Patient thought to have possible restrictive disease. He completed pulmonary function testing. Dr. Delton Coombes recommended bronchoscopy, but patient deferred. Patient reports respiratory status is stable.  Type II diabetes-stable. He rarely checks his blood sugars. His weight is stable.  Hypertension - stable  Review of Systems Negative for chest pain  Past Medical History  Diagnosis Date  . Allergy   . Diabetes mellitus     type II  . Hyperlipidemia   . Hypertension   . Apnea, sleep 07/28/07    NPSG [place], AHI 79.9  . GERD (gastroesophageal reflux disease)   . Hypospadias   . Coronary heart disease 2008    Cath 2008, 60-70% proximal diagonal 1 stenosis,  RCA 40% stenosis.  nonischemic Lexiscan 03/2011  . Carotid artery stenosis     mild 04/2010, 05/2012  . Sleep apnea     History   Social History  . Marital Status: Married    Spouse Name: N/A    Number of Children: 3  . Years of Education: N/A   Occupational History  . Retired Holiday representative    Social History Main Topics  . Smoking status: Never Smoker   . Smokeless tobacco: Never Used  . Alcohol Use: No  . Drug Use: No  . Sexually Active: Not on file   Other Topics Concern  . Not on file   Social History Narrative   Married.  Three children from first marriage.  First wife died of ovarian cancer.      Past Surgical History  Procedure Laterality Date  . Tonsillectomy    . Nissen fundoplication    . Nasal sinus surgery    . Vein ligation and stripping      Family History  Problem Relation Age of Onset  . Coronary artery disease Father 69    CABG    Allergies  Allergen Reactions  . Iohexol      Desc: pt. states severe reaction, dr.  told patient never to have the dye again     Current Outpatient Prescriptions on File Prior to Visit  Medication Sig Dispense Refill  . aspirin 81 MG tablet Take 1 tablet (81 mg total) by mouth daily.  90 tablet  3  . atorvastatin (LIPITOR) 20 MG tablet Take 1 tablet (20 mg total) by mouth daily.  90 tablet  3  . calcium-vitamin D (CALCIUM 500+D) 500-400 MG-UNIT per tablet Take 1 tablet by mouth daily.  90 tablet  3  . Coenzyme Q10 150 MG CAPS Take 1 capsule (150 mg total) by mouth daily.  90 capsule  3  . fexofenadine (ALLEGRA) 180 MG tablet Take 1 tablet (180 mg total) by mouth daily.  90 tablet  3  . gabapentin (NEURONTIN) 300 MG capsule Take 1 capsule (300 mg total) by mouth at bedtime. One to two daily prn  90 capsule  3  . glimepiride (AMARYL) 1 MG tablet Take 1 tablet (1 mg total) by mouth 2 (two) times daily.  180 tablet  3  . Glucosamine-Chondroitin 1500-1200 MG/30ML LIQD Twice a day  480 mL  12  . glucose blood (FREESTYLE LITE) test strip 1 each by Other route as needed. Use to test blood sugar three times a  day as instructed.  Dx. 250.00       . halcinonide (HALOG) 0.1 % external solution Apply topically 2 (two) times daily.        . mometasone (NASONEX) 50 MCG/ACT nasal spray Place 2 sprays into the nose daily.  17 g  prn  . Vitamins-Lipotropics (B-50) TABS Take 1 tablet by mouth daily.         No current facility-administered medications on file prior to visit.    BP 126/66  Pulse 68  Temp(Src) 97.7 F (36.5 C) (Oral)  Wt 224 lb (101.606 kg)  BMI 34.07 kg/m2       Objective:   Physical Exam  Constitutional: He appears well-developed and well-nourished.  Cardiovascular: Normal rate, regular rhythm and normal heart sounds.   Pulmonary/Chest: Effort normal and breath sounds normal. He has no wheezes.  Musculoskeletal: He exhibits no edema.  Neurological: No cranial nerve deficit.  Psychiatric: He has a normal mood and affect. His behavior is normal.           Assessment & Plan:

## 2013-01-30 NOTE — Assessment & Plan Note (Signed)
Stable.  No change in medication. 

## 2013-03-13 DIAGNOSIS — H40019 Open angle with borderline findings, low risk, unspecified eye: Secondary | ICD-10-CM | POA: Diagnosis not present

## 2013-03-13 DIAGNOSIS — E119 Type 2 diabetes mellitus without complications: Secondary | ICD-10-CM | POA: Diagnosis not present

## 2013-03-13 DIAGNOSIS — H35379 Puckering of macula, unspecified eye: Secondary | ICD-10-CM | POA: Diagnosis not present

## 2013-03-13 DIAGNOSIS — H2510 Age-related nuclear cataract, unspecified eye: Secondary | ICD-10-CM | POA: Diagnosis not present

## 2013-03-13 DIAGNOSIS — H35039 Hypertensive retinopathy, unspecified eye: Secondary | ICD-10-CM | POA: Diagnosis not present

## 2013-03-13 DIAGNOSIS — H04129 Dry eye syndrome of unspecified lacrimal gland: Secondary | ICD-10-CM | POA: Diagnosis not present

## 2013-03-25 ENCOUNTER — Encounter: Payer: Self-pay | Admitting: Internal Medicine

## 2013-05-01 ENCOUNTER — Telehealth: Payer: Self-pay | Admitting: Emergency Medicine

## 2013-05-01 NOTE — Telephone Encounter (Signed)
Spoke to [place] and there is some issues billing his ins because he is from [place] and she is going to contact the sleep center where the study was done and see if they can help her so pt can get his oral appliances here [place]

## 2013-05-15 DIAGNOSIS — E1149 Type 2 diabetes mellitus with other diabetic neurological complication: Secondary | ICD-10-CM | POA: Diagnosis not present

## 2013-05-15 DIAGNOSIS — I2510 Atherosclerotic heart disease of native coronary artery without angina pectoris: Secondary | ICD-10-CM | POA: Diagnosis not present

## 2013-05-15 DIAGNOSIS — E785 Hyperlipidemia, unspecified: Secondary | ICD-10-CM | POA: Diagnosis not present

## 2013-05-15 DIAGNOSIS — E1142 Type 2 diabetes mellitus with diabetic polyneuropathy: Secondary | ICD-10-CM | POA: Diagnosis not present

## 2013-05-15 DIAGNOSIS — I10 Essential (primary) hypertension: Secondary | ICD-10-CM | POA: Diagnosis not present

## 2013-05-15 DIAGNOSIS — N289 Disorder of kidney and ureter, unspecified: Secondary | ICD-10-CM | POA: Diagnosis not present

## 2013-05-15 DIAGNOSIS — Z125 Encounter for screening for malignant neoplasm of prostate: Secondary | ICD-10-CM | POA: Diagnosis not present

## 2013-05-18 ENCOUNTER — Other Ambulatory Visit: Payer: Self-pay

## 2013-05-24 DIAGNOSIS — Z125 Encounter for screening for malignant neoplasm of prostate: Secondary | ICD-10-CM | POA: Diagnosis not present

## 2013-05-31 DIAGNOSIS — I2510 Atherosclerotic heart disease of native coronary artery without angina pectoris: Secondary | ICD-10-CM | POA: Diagnosis not present

## 2013-05-31 DIAGNOSIS — I6529 Occlusion and stenosis of unspecified carotid artery: Secondary | ICD-10-CM | POA: Diagnosis not present

## 2013-05-31 DIAGNOSIS — G4730 Sleep apnea, unspecified: Secondary | ICD-10-CM | POA: Diagnosis not present

## 2013-05-31 DIAGNOSIS — I739 Peripheral vascular disease, unspecified: Secondary | ICD-10-CM | POA: Diagnosis not present

## 2013-08-08 DIAGNOSIS — Z23 Encounter for immunization: Secondary | ICD-10-CM | POA: Diagnosis not present

## 2013-09-14 ENCOUNTER — Other Ambulatory Visit: Payer: Self-pay

## 2013-09-20 ENCOUNTER — Telehealth: Payer: Self-pay | Admitting: Internal Medicine

## 2013-09-20 NOTE — Telephone Encounter (Signed)
I don't see anything documented in your last office note.  Please advise

## 2013-09-20 NOTE — Telephone Encounter (Signed)
Pt was suppose to return in July 2014. Pt would like to sch blood work prior to make an appt. What labs if any should pt have ?

## 2013-09-28 NOTE — Telephone Encounter (Signed)
Pt made appt on 10-11-13. Pt is still waiting to hear about sch blood work

## 2013-10-11 ENCOUNTER — Encounter: Payer: Self-pay | Admitting: Internal Medicine

## 2013-10-11 ENCOUNTER — Ambulatory Visit (INDEPENDENT_AMBULATORY_CARE_PROVIDER_SITE_OTHER): Payer: Medicare Other | Admitting: Internal Medicine

## 2013-10-11 VITALS — BP 142/72 | HR 64 | Temp 98.2°F | Ht 68.0 in | Wt 224.0 lb

## 2013-10-11 DIAGNOSIS — E119 Type 2 diabetes mellitus without complications: Secondary | ICD-10-CM | POA: Diagnosis not present

## 2013-10-11 DIAGNOSIS — Z23 Encounter for immunization: Secondary | ICD-10-CM

## 2013-10-11 DIAGNOSIS — I10 Essential (primary) hypertension: Secondary | ICD-10-CM

## 2013-10-11 DIAGNOSIS — IMO0001 Reserved for inherently not codable concepts without codable children: Secondary | ICD-10-CM

## 2013-10-11 DIAGNOSIS — M791 Myalgia, unspecified site: Secondary | ICD-10-CM

## 2013-10-11 DIAGNOSIS — R0602 Shortness of breath: Secondary | ICD-10-CM

## 2013-10-11 DIAGNOSIS — E785 Hyperlipidemia, unspecified: Secondary | ICD-10-CM | POA: Diagnosis not present

## 2013-10-11 LAB — BASIC METABOLIC PANEL
BUN: 15 mg/dL (ref 6–23)
CO2: 28 mEq/L (ref 19–32)
Calcium: 9.1 mg/dL (ref 8.4–10.5)
Chloride: 105 mEq/L (ref 96–112)
Creatinine, Ser: 1 mg/dL (ref 0.4–1.5)
GFR: 81.3 mL/min (ref >60.00)
Glucose, Bld: 75 mg/dL (ref 70–99)
Potassium: 4 mEq/L (ref 3.5–5.1)
Sodium: 140 mEq/L (ref 135–145)

## 2013-10-11 LAB — MICROALBUMIN / CREATININE URINE RATIO
Creatinine,U: 56.4 mg/dL
Microalb Creat Ratio: 0.4 mg/g (ref 0.0–30.0)
Microalb, Ur: 0.2 mg/dL (ref 0.0–1.9)

## 2013-10-11 LAB — TSH: TSH: 0.67 u[IU]/mL (ref 0.35–5.50)

## 2013-10-11 LAB — CK: Total CK: 121 U/L (ref 7–232)

## 2013-10-11 LAB — HEMOGLOBIN A1C: Hgb A1c MFr Bld: 6.3 % (ref 4.6–6.5)

## 2013-10-11 MED ORDER — METFORMIN HCL 500 MG PO TABS: ORAL_TABLET | ORAL | Status: DC

## 2013-10-11 MED ORDER — AMLODIPINE BESYLATE 10 MG PO TABS: 10.0000 mg | ORAL_TABLET | Freq: Every day | ORAL | Status: DC

## 2013-10-11 NOTE — Progress Notes (Signed)
Subjective:    Patient ID: Ian [name], male    DOB: Apr 04, 1941, 72 y.o.   MRN: [id number]  HPI  72 year old white male with hx of DM II, htn, and shortness of breath for follow up.  Patient seen by Dr. Delton Coombes.   PFTs shows inspiratory defect.  Question vocal cord issues.  He has deferred FOB for now.  DM II - weight stable. Rare hypoglycemia.  He stays hungry.  Htn - good mediation compliance.   Review of Systems Negative for chest pain,  No foot issues.    Past Medical History  Diagnosis Date  . Allergy   . Diabetes mellitus     type II  . Hyperlipidemia   . Hypertension   . Apnea, sleep 07/28/07    NPSG [place], AHI 79.9  . GERD (gastroesophageal reflux disease)   . Hypospadias   . Coronary heart disease 2008    Cath 2008, 60-70% proximal diagonal 1 stenosis,  RCA 40% stenosis.  nonischemic Lexiscan 03/2011  . Carotid artery stenosis     mild 04/2010, 05/2012  . Sleep apnea     History   Social History  . Marital Status: Married    Spouse Name: N/A    Number of Children: 3  . Years of Education: N/A   Occupational History  . Retired Holiday representative    Social History Main Topics  . Smoking status: Never Smoker   . Smokeless tobacco: Never Used  . Alcohol Use: No  . Drug Use: No  . Sexual Activity: Not on file   Other Topics Concern  . Not on file   Social History Narrative   Married.  Three children from first marriage.  First wife died of ovarian cancer.      Past Surgical History  Procedure Laterality Date  . Tonsillectomy    . Nissen fundoplication    . Nasal sinus surgery    . Vein ligation and stripping      Family History  Problem Relation Age of Onset  . Coronary artery disease Father 56    CABG    Allergies  Allergen Reactions  . Iohexol      Desc: pt. states severe reaction, dr. told patient never to have the dye again     Current Outpatient Prescriptions on File Prior to Visit  Medication Sig Dispense Refill  . amLODipine  (NORVASC) 5 MG tablet Take 1 tablet (5 mg total) by mouth daily.  90 tablet  3  . aspirin 81 MG tablet Take 1 tablet (81 mg total) by mouth daily.  90 tablet  3  . atorvastatin (LIPITOR) 20 MG tablet Take 1 tablet (20 mg total) by mouth daily.  90 tablet  3  . Coenzyme Q10 150 MG CAPS Take 1 capsule (150 mg total) by mouth daily.  90 capsule  3  . doxazosin (CARDURA) 2 MG tablet Take 1 tablet (2 mg total) by mouth at bedtime.  90 tablet  3  . fexofenadine (ALLEGRA) 180 MG tablet Take 1 tablet (180 mg total) by mouth daily.  90 tablet  3  . finasteride (PROSCAR) 5 MG tablet Take 1 tablet (5 mg total) by mouth daily.  90 tablet  3  . glimepiride (AMARYL) 1 MG tablet Take 1 tablet (1 mg total) by mouth 2 (two) times daily.  180 tablet  3  . Glucosamine-Chondroitin 1500-1200 MG/30ML LIQD Twice a day  480 mL  12  . glucose blood (FREESTYLE LITE)  test strip 1 each by Other route as needed. Use to test blood sugar three times a day as instructed.  Dx. 250.00       . LORazepam (ATIVAN) 0.5 MG tablet Take 1 tablet (0.5 mg total) by mouth at bedtime as needed.  90 tablet  1  . metFORMIN (GLUCOPHAGE) 500 MG tablet Take 1 tablet (500 mg total) by mouth 2 (two) times daily with a meal.  180 tablet  3  . metoprolol succinate (TOPROL-XL) 25 MG 24 hr tablet Take 1 tablet (25 mg total) by mouth daily.  90 tablet  3  . mometasone (NASONEX) 50 MCG/ACT nasal spray Place 2 sprays into the nose daily.  17 g  prn  . olmesartan (BENICAR) 40 MG tablet Take 1 tablet (40 mg total) by mouth daily.  90 tablet  3  . omeprazole (PRILOSEC) 20 MG capsule Take 1 capsule (20 mg total) by mouth 2 (two) times daily.  180 capsule  3  . Vitamins-Lipotropics (B-50) TABS Take 1 tablet by mouth daily.        Marland Kitchen zolpidem (AMBIEN) 5 MG tablet Take 1 tablet (5 mg total) by mouth at bedtime as needed for sleep.  30 tablet  5   No current facility-administered medications on file prior to visit.    BP 142/72  Pulse 64  Temp(Src) 98.2 F  (36.8 C) (Oral)  Ht 5\' 8"  (1.727 m)  Wt 224 lb (101.606 kg)  BMI 34.07 kg/m2    Objective:   Physical Exam  Constitutional: He is oriented to person, place, and time. He appears well-developed and well-nourished.  HENT:  Head: Normocephalic and atraumatic.  Right Ear: External ear normal.  Left Ear: External ear normal.  Crowded oropharynx  Neck: Neck supple.  No carotid bruit  Cardiovascular: Normal rate, regular rhythm and normal heart sounds.   No murmur heard. Pulmonary/Chest: Effort normal and breath sounds normal. He has no wheezes.  Musculoskeletal:  Trace lower extremity edema  Neurological: He is alert and oriented to person, place, and time. No cranial nerve deficit.  Skin: Skin is warm. No erythema.  Psychiatric: He has a normal mood and affect. His behavior is normal.          Assessment & Plan:

## 2013-10-11 NOTE — Assessment & Plan Note (Signed)
Patient defers FOB.  Unclear whether he has VC dysfunction vs poor diaphragm strength.  Work on weight loss for now.

## 2013-10-11 NOTE — Assessment & Plan Note (Signed)
Patient complains of mild discomfort above both knee caps.  I doubt symptoms related to lipitor.  Check CPK.  Try water soluble CoQ10.

## 2013-10-11 NOTE — Patient Instructions (Signed)
Change to water soluble CoQ 10 - Ubiquinol

## 2013-10-11 NOTE — Assessment & Plan Note (Signed)
Reduce amaryl dose.  Increase metformin. Lab Results  Component Value Date   HGBA1C 6.3 10/11/2013

## 2013-10-11 NOTE — Assessment & Plan Note (Signed)
BP suboptimally controlled.  Increase amlodipine to 10 mg.   BP: 142/72 mmHg

## 2013-10-11 NOTE — Progress Notes (Signed)
Pre visit review using our clinic review tool, if applicable. No additional management support is needed unless otherwise documented below in the visit note. 

## 2013-10-12 DIAGNOSIS — H40019 Open angle with borderline findings, low risk, unspecified eye: Secondary | ICD-10-CM | POA: Diagnosis not present

## 2013-10-12 DIAGNOSIS — H04129 Dry eye syndrome of unspecified lacrimal gland: Secondary | ICD-10-CM | POA: Diagnosis not present

## 2013-10-20 ENCOUNTER — Encounter: Payer: Self-pay | Admitting: Internal Medicine

## 2013-11-13 ENCOUNTER — Encounter: Payer: Self-pay | Admitting: Internal Medicine

## 2013-11-16 ENCOUNTER — Encounter: Payer: Self-pay | Admitting: Internal Medicine

## 2013-11-20 ENCOUNTER — Encounter: Payer: Self-pay | Admitting: Internal Medicine

## 2013-11-20 ENCOUNTER — Ambulatory Visit (INDEPENDENT_AMBULATORY_CARE_PROVIDER_SITE_OTHER): Payer: Medicare Other | Admitting: Internal Medicine

## 2013-11-20 VITALS — BP 134/68 | HR 64 | Temp 98.3°F | Ht 68.0 in | Wt 223.0 lb

## 2013-11-20 DIAGNOSIS — I10 Essential (primary) hypertension: Secondary | ICD-10-CM

## 2013-11-20 DIAGNOSIS — F5104 Psychophysiologic insomnia: Secondary | ICD-10-CM

## 2013-11-20 DIAGNOSIS — G4700 Insomnia, unspecified: Secondary | ICD-10-CM

## 2013-11-20 DIAGNOSIS — E1149 Type 2 diabetes mellitus with other diabetic neurological complication: Secondary | ICD-10-CM | POA: Diagnosis not present

## 2013-11-20 DIAGNOSIS — E1142 Type 2 diabetes mellitus with diabetic polyneuropathy: Secondary | ICD-10-CM

## 2013-11-20 MED ORDER — LORAZEPAM 0.5 MG PO TABS: 0.5000 mg | ORAL_TABLET | Freq: Every evening | ORAL | Status: DC | PRN

## 2013-11-20 MED ORDER — GABAPENTIN 300 MG PO CAPS: 300.0000 mg | ORAL_CAPSULE | Freq: Two times a day (BID) | ORAL | Status: DC

## 2013-11-20 MED ORDER — AMLODIPINE BESYLATE 10 MG PO TABS: 10.0000 mg | ORAL_TABLET | Freq: Every day | ORAL | Status: DC

## 2013-11-20 MED ORDER — ZOLPIDEM TARTRATE 5 MG PO TABS: 5.0000 mg | ORAL_TABLET | Freq: Every evening | ORAL | Status: DC | PRN

## 2013-11-20 NOTE — Progress Notes (Signed)
[facility] report did not print

## 2013-11-20 NOTE — Assessment & Plan Note (Signed)
Patient tolerating higher dose of metformin. Taper off Amaryl if possible.

## 2013-11-20 NOTE — Progress Notes (Signed)
Subjective:    Patient ID: Ian [name], male    DOB: [birth year], 73 y.o.   MRN: [id number]  HPI  73 year old white male with history of type 2 diabetes, hypertension, and shortness of breath for followup. At previous visit his amlodipine was increased to 10 mg. His systolic blood pressure has decreased in the low 130s. He denies any lower extremity swelling.  He has neuropathic pain in his lower extremities. He is taking gabapentin 300 mg at bedtime.  Type 2 diabetes-he is tolerating higher dose of metformin.   Review of Systems Negative for chest pain, mild dyspnea/shortness of breath unchanged. His symptoms worse occasionally with laying down.    Past Medical History  Diagnosis Date  . Allergy   . Diabetes mellitus     type II  . Hyperlipidemia   . Hypertension   . Apnea, sleep 07/28/07    NPSG [place], AHI 79.9  . GERD (gastroesophageal reflux disease)   . Hypospadias   . Coronary heart disease 2008    Cath 2008, 60-70% proximal diagonal 1 stenosis,  RCA 40% stenosis.  nonischemic Lexiscan 03/2011  . Carotid artery stenosis     mild 04/2010, 05/2012  . Sleep apnea     History   Social History  . Marital Status: Married    Spouse Name: N/A    Number of Children: 3  . Years of Education: N/A   Occupational History  . Retired Solicitor    Social History Main Topics  . Smoking status: Never Smoker   . Smokeless tobacco: Never Used  . Alcohol Use: No  . Drug Use: No  . Sexual Activity: Not on file   Other Topics Concern  . Not on file   Social History Narrative   Married.  Three children from first marriage.  First wife died of ovarian cancer.      Past Surgical History  Procedure Laterality Date  . Tonsillectomy    . Nissen fundoplication    . Nasal sinus surgery    . Vein ligation and stripping      Family History  Problem Relation Age of Onset  . Coronary artery disease Father 73    CABG    Allergies  Allergen Reactions  . Iohexol      Desc: pt. states severe reaction, dr. told patient never to have the dye again     Current Outpatient Prescriptions on File Prior to Visit  Medication Sig Dispense Refill  . aspirin 81 MG tablet Take 1 tablet (81 mg total) by mouth daily.  90 tablet  3  . atorvastatin (LIPITOR) 20 MG tablet Take 1 tablet (20 mg total) by mouth daily.  90 tablet  3  . Coenzyme Q10 150 MG CAPS Take 1 capsule (150 mg total) by mouth daily.  90 capsule  3  . doxazosin (CARDURA) 2 MG tablet Take 1 tablet (2 mg total) by mouth at bedtime.  90 tablet  3  . fexofenadine (ALLEGRA) 180 MG tablet Take 1 tablet (180 mg total) by mouth daily.  90 tablet  3  . finasteride (PROSCAR) 5 MG tablet Take 1 tablet (5 mg total) by mouth daily.  90 tablet  3  . glimepiride (AMARYL) 1 MG tablet Take 1 tablet (1 mg total) by mouth daily.  180 tablet  3  . Glucosamine-Chondroitin 1500-1200 MG/30ML LIQD Twice a day  480 mL  12  . glucose blood (FREESTYLE LITE) test strip 1 each by Other route as  needed. Use to test blood sugar three times a day as instructed.  Dx. 250.00       . metFORMIN (GLUCOPHAGE) 500 MG tablet Take 2 tablets with AM meal and 1 tablet with PM meal  270 tablet  3  . metoprolol succinate (TOPROL-XL) 25 MG 24 hr tablet Take 1 tablet (25 mg total) by mouth daily.  90 tablet  3  . mometasone (NASONEX) 50 MCG/ACT nasal spray Place 2 sprays into the nose daily.  17 g  prn  . olmesartan (BENICAR) 40 MG tablet Take 1 tablet (40 mg total) by mouth daily.  90 tablet  3  . Omega-3 Fatty Acids (FISH OIL) 1200 MG CAPS Take 1 capsule by mouth daily.      Marland Kitchen omeprazole (PRILOSEC) 20 MG capsule Take 1 capsule (20 mg total) by mouth 2 (two) times daily.  180 capsule  3   No current facility-administered medications on file prior to visit.    BP 134/68  Pulse 64  Temp(Src) 98.3 F (36.8 C) (Oral)  Ht 5\' 8"  (1.727 m)  Wt 223 lb (101.152 kg)  BMI 33.91 kg/m2    Objective:   Physical Exam  Constitutional: He is oriented to  person, place, and time. He appears well-developed and well-nourished. No distress.  Cardiovascular: Normal rate, regular rhythm and normal heart sounds.   No murmur heard. Pulmonary/Chest: Effort normal and breath sounds normal. He has no wheezes.  Musculoskeletal: He exhibits no edema.  Neurological: He is alert and oriented to person, place, and time. No cranial nerve deficit.  Psychiatric: He has a normal mood and affect. His behavior is normal.        Assessment & Plan:

## 2013-11-20 NOTE — Assessment & Plan Note (Signed)
Unchanged.  Continue zolpidem as needed.

## 2013-11-20 NOTE — Patient Instructions (Signed)
Please complete the following lab tests before your next follow up appointment: BMET, A1c - 250.00 Perform deep breathing exercises as directed

## 2013-11-20 NOTE — Assessment & Plan Note (Addendum)
Increase Gabapentin to 300 mg twice daily

## 2013-11-20 NOTE — Assessment & Plan Note (Signed)
Patient's blood pressure is improved. Continue higher dose of amlodipine. BP: 134/68 mmHg

## 2013-11-21 ENCOUNTER — Telehealth: Payer: Self-pay

## 2013-11-21 NOTE — Telephone Encounter (Signed)
Relevant patient education assigned to patient using Emmi. ° °

## 2013-11-23 ENCOUNTER — Telehealth: Payer: Self-pay | Admitting: *Deleted

## 2013-11-23 DIAGNOSIS — E1165 Type 2 diabetes mellitus with hyperglycemia: Secondary | ICD-10-CM

## 2013-11-23 DIAGNOSIS — IMO0001 Reserved for inherently not codable concepts without codable children: Secondary | ICD-10-CM

## 2013-11-23 DIAGNOSIS — G629 Polyneuropathy, unspecified: Secondary | ICD-10-CM

## 2013-11-23 NOTE — Telephone Encounter (Signed)
Medicare won't cover with that diagnosis

## 2013-11-23 NOTE — Telephone Encounter (Signed)
Please check to see if neuropathy code with work.  If not try - numbness and tingling.  If that doesn't work, inform pt that medicare will not cover blood test but I recommend he have blood test and he will be billed for B12 test.

## 2013-11-23 NOTE — Telephone Encounter (Signed)
Message copied by Pearletha Forge on Thu Nov 23, 2013  9:31 AM ------      Message from: Rosine Abe      Created: Mon Nov 20, 2013  7:02 PM       Please add vitamin B12 to next lab draw.  Use peripheral neuropathy as code.            thx. ------

## 2013-11-27 ENCOUNTER — Other Ambulatory Visit: Payer: Self-pay | Admitting: Internal Medicine

## 2013-12-12 ENCOUNTER — Telehealth: Payer: Self-pay | Admitting: Internal Medicine

## 2013-12-12 NOTE — Telephone Encounter (Signed)
Relevant patient education mailed to patient.  

## 2014-01-02 ENCOUNTER — Encounter: Payer: Self-pay | Admitting: Internal Medicine

## 2014-01-19 ENCOUNTER — Ambulatory Visit (INDEPENDENT_AMBULATORY_CARE_PROVIDER_SITE_OTHER): Payer: Medicare Other | Admitting: Internal Medicine

## 2014-01-19 ENCOUNTER — Encounter: Payer: Self-pay | Admitting: Internal Medicine

## 2014-01-19 VITALS — BP 162/70 | HR 84 | Temp 98.2°F | Ht 68.0 in | Wt 222.0 lb

## 2014-01-19 DIAGNOSIS — N529 Male erectile dysfunction, unspecified: Secondary | ICD-10-CM | POA: Insufficient documentation

## 2014-01-19 DIAGNOSIS — I10 Essential (primary) hypertension: Secondary | ICD-10-CM

## 2014-01-19 DIAGNOSIS — E1149 Type 2 diabetes mellitus with other diabetic neurological complication: Secondary | ICD-10-CM

## 2014-01-19 MED ORDER — SILDENAFIL CITRATE 100 MG PO TABS: 50.0000 mg | ORAL_TABLET | Freq: Every day | ORAL | Status: DC | PRN

## 2014-01-19 MED ORDER — NEBIVOLOL HCL 5 MG PO TABS
5.0000 mg | ORAL_TABLET | Freq: Every day | ORAL | Status: DC
Start: 2014-01-19 — End: 2014-02-26

## 2014-01-19 NOTE — Progress Notes (Signed)
Pre visit review using our clinic review tool, if applicable. No additional management support is needed unless otherwise documented below in the visit note. 

## 2014-01-19 NOTE — Assessment & Plan Note (Signed)
Monitor A1c.  Screen for hemochromatosis with IBC panel and ferritin level.

## 2014-01-19 NOTE — Progress Notes (Signed)
Subjective:    Patient ID: Ian [name], male    DOB: [birth year], 73 y.o.   MRN: [id number]  HPI  73 year old white male with history of coronary artery disease, type 2 diabetes and hypertension for followup. Patient reports good compliance with his medications. His blood pressures elevated today.  Patient on multiple antihypertensives including metoprolol.  Patient complains of symptoms erectile dysfunction. Patient reports difficulty attaining and maintaining erection.  Type 2 diabetes-stable  Review of Systems Negative for chest pain, negative for shortness of breath    Past Medical History  Diagnosis Date  . Allergy   . Diabetes mellitus     type II  . Hyperlipidemia   . Hypertension   . Apnea, sleep 07/28/07    NPSG [place], AHI 79.9  . GERD (gastroesophageal reflux disease)   . Hypospadias   . Coronary heart disease 2008    Cath 2008, 60-70% proximal diagonal 1 stenosis,  RCA 40% stenosis.  nonischemic Lexiscan 03/2011  . Carotid artery stenosis     mild 04/2010, 05/2012  . Sleep apnea     History   Social History  . Marital Status: Married    Spouse Name: N/A    Number of Children: 3  . Years of Education: N/A   Occupational History  . Retired Solicitor    Social History Main Topics  . Smoking status: Never Smoker   . Smokeless tobacco: Never Used  . Alcohol Use: No  . Drug Use: No  . Sexual Activity: Not on file   Other Topics Concern  . Not on file   Social History Narrative   Married.  Three children from first marriage.  First wife died of ovarian cancer.      Past Surgical History  Procedure Laterality Date  . Tonsillectomy    . Nissen fundoplication    . Nasal sinus surgery    . Vein ligation and stripping      Family History  Problem Relation Age of Onset  . Coronary artery disease Father 69    CABG    Allergies  Allergen Reactions  . Iohexol      Desc: pt. states severe reaction, dr. told patient never to have the dye  again     Current Outpatient Prescriptions on File Prior to Visit  Medication Sig Dispense Refill  . amLODipine (NORVASC) 5 MG tablet TAKE 1 TABLET DAILY  90 tablet  2  . aspirin 81 MG tablet Take 1 tablet (81 mg total) by mouth daily.  90 tablet  3  . atorvastatin (LIPITOR) 20 MG tablet TAKE 1 TABLET DAILY  90 tablet  2  . Coenzyme Q10 150 MG CAPS Take 1 capsule (150 mg total) by mouth daily.  90 capsule  3  . doxazosin (CARDURA) 2 MG tablet Take 1 tablet (2 mg total) by mouth at bedtime.  90 tablet  3  . fexofenadine (ALLEGRA) 180 MG tablet Take 1 tablet (180 mg total) by mouth daily.  90 tablet  3  . finasteride (PROSCAR) 5 MG tablet Take 1 tablet (5 mg total) by mouth daily.  90 tablet  3  . gabapentin (NEURONTIN) 300 MG capsule Take 1 capsule (300 mg total) by mouth 2 (two) times daily.  180 capsule  1  . glimepiride (AMARYL) 1 MG tablet Take 1 tablet (1 mg total) by mouth daily.  180 tablet  3  . Glucosamine-Chondroitin 1500-1200 MG/30ML LIQD Twice a day  480 mL  12  .  glucose blood (FREESTYLE LITE) test strip 1 each by Other route as needed. Use to test blood sugar three times a day as instructed.  Dx. 250.00       . LORazepam (ATIVAN) 0.5 MG tablet Take 1 tablet (0.5 mg total) by mouth at bedtime as needed.  90 tablet  1  . metFORMIN (GLUCOPHAGE) 500 MG tablet Take 2 tablets with AM meal and 1 tablet with PM meal  270 tablet  3  . mometasone (NASONEX) 50 MCG/ACT nasal spray Place 2 sprays into the nose daily.  17 g  prn  . olmesartan (BENICAR) 40 MG tablet Take 1 tablet (40 mg total) by mouth daily.  90 tablet  3  . Omega-3 Fatty Acids (FISH OIL) 1200 MG CAPS Take 1 capsule by mouth daily.      Marland Kitchen omeprazole (PRILOSEC) 20 MG capsule Take 1 capsule (20 mg total) by mouth 2 (two) times daily.  180 capsule  3  . triamcinolone (KENALOG) 0.025 % cream Apply 1 application topically as needed.      . zolpidem (AMBIEN) 5 MG tablet Take 1 tablet (5 mg total) by mouth at bedtime as needed for  sleep.  30 tablet  5   No current facility-administered medications on file prior to visit.    BP 162/70  Pulse 84  Temp(Src) 98.2 F (36.8 C) (Oral)  Ht 5\' 8"  (1.727 m)  Wt 222 lb (100.699 kg)  BMI 33.76 kg/m2    Objective:   Physical Exam  Constitutional: He is oriented to person, place, and time. He appears well-developed and well-nourished. No distress.  Neck:  No carotid bruit  Cardiovascular: Normal rate, regular rhythm and normal heart sounds.   No murmur heard. Pulmonary/Chest: Effort normal and breath sounds normal. He has no wheezes.  Neurological: He is alert and oriented to person, place, and time. No cranial nerve deficit.  Psychiatric: He has a normal mood and affect. His behavior is normal.       Assessment & Plan:

## 2014-01-19 NOTE — Assessment & Plan Note (Signed)
Discontinue metoprolol. Change to Bystolic. Hopefully this will help improve erectile dysfunction as well as BP control. BP: 162/70 mmHg

## 2014-01-19 NOTE — Assessment & Plan Note (Signed)
Patient has multiple risk factors for erectile dysfunction. Trial of Viagra 50-100 mg as directed. Patient to monitor for hypotension especially with use of Cardura.  We discussed common side effects.  Patient not candidate for testosterone replacement.

## 2014-01-19 NOTE — Patient Instructions (Addendum)
Please complete the following lab tests before your next follow up appointment: BMET - 401.9 A1c - 250.00 FLP, LFTs - 272.4

## 2014-01-20 ENCOUNTER — Encounter: Payer: Self-pay | Admitting: Internal Medicine

## 2014-01-22 ENCOUNTER — Telehealth: Payer: Self-pay | Admitting: Internal Medicine

## 2014-01-22 ENCOUNTER — Other Ambulatory Visit: Payer: Self-pay | Admitting: Internal Medicine

## 2014-01-22 MED ORDER — AMLODIPINE BESYLATE 10 MG PO TABS: 10.0000 mg | ORAL_TABLET | Freq: Every day | ORAL | Status: DC

## 2014-01-22 NOTE — Telephone Encounter (Signed)
Relevant patient education assigned to patient using Emmi. ° °

## 2014-02-02 ENCOUNTER — Other Ambulatory Visit: Payer: Self-pay | Admitting: Internal Medicine

## 2014-02-23 ENCOUNTER — Other Ambulatory Visit (INDEPENDENT_AMBULATORY_CARE_PROVIDER_SITE_OTHER): Payer: Medicare Other

## 2014-02-23 DIAGNOSIS — I10 Essential (primary) hypertension: Secondary | ICD-10-CM

## 2014-02-23 DIAGNOSIS — N138 Other obstructive and reflux uropathy: Secondary | ICD-10-CM | POA: Diagnosis not present

## 2014-02-23 DIAGNOSIS — E1149 Type 2 diabetes mellitus with other diabetic neurological complication: Secondary | ICD-10-CM | POA: Diagnosis not present

## 2014-02-23 DIAGNOSIS — N401 Enlarged prostate with lower urinary tract symptoms: Secondary | ICD-10-CM

## 2014-02-23 DIAGNOSIS — E785 Hyperlipidemia, unspecified: Secondary | ICD-10-CM | POA: Diagnosis not present

## 2014-02-23 DIAGNOSIS — N259 Disorder resulting from impaired renal tubular function, unspecified: Secondary | ICD-10-CM | POA: Diagnosis not present

## 2014-02-23 LAB — BASIC METABOLIC PANEL
BUN: 18 mg/dL (ref 6–23)
CO2: 27 mEq/L (ref 19–32)
Calcium: 9.2 mg/dL (ref 8.4–10.5)
Chloride: 105 mEq/L (ref 96–112)
Creatinine, Ser: 1 mg/dL (ref 0.4–1.5)
GFR: 81.22 mL/min (ref >60.00)
Glucose, Bld: 131 mg/dL — ABNORMAL HIGH (ref 70–99)
Potassium: 4.5 mEq/L (ref 3.5–5.1)
Sodium: 139 mEq/L (ref 135–145)

## 2014-02-23 LAB — HEPATIC FUNCTION PANEL
ALT: 24 U/L (ref 0–53)
AST: 22 U/L (ref 0–37)
Albumin: 3.9 g/dL (ref 3.5–5.2)
Alkaline Phosphatase: 43 U/L (ref 39–117)
Bilirubin, Direct: 0.2 mg/dL (ref 0.0–0.3)
Total Bilirubin: 1.5 mg/dL — ABNORMAL HIGH (ref 0.3–1.2)
Total Protein: 6.8 g/dL (ref 6.0–8.3)

## 2014-02-23 LAB — LIPID PANEL
Cholesterol: 168 mg/dL (ref 0–200)
HDL: 38.9 mg/dL — ABNORMAL LOW (ref >39.00)
LDL Cholesterol: 110 mg/dL — ABNORMAL HIGH (ref 0–99)
Total CHOL/HDL Ratio: 4
Triglycerides: 95 mg/dL (ref 0.0–149.0)
VLDL: 19 mg/dL (ref 0.0–40.0)

## 2014-02-23 LAB — FERRITIN: Ferritin: 65.2 ng/mL (ref 22.0–322.0)

## 2014-02-23 LAB — PSA: PSA: 0.23 ng/mL (ref 0.10–4.00)

## 2014-02-23 LAB — HEMOGLOBIN A1C: Hgb A1c MFr Bld: 5.9 % (ref 4.6–6.5)

## 2014-02-23 LAB — IBC PANEL
Iron: 62 ug/dL (ref 42–165)
Saturation Ratios: 17.2 % — ABNORMAL LOW (ref 20.0–50.0)
Transferrin: 257.8 mg/dL (ref 212.0–360.0)

## 2014-02-26 ENCOUNTER — Encounter: Payer: Self-pay | Admitting: Internal Medicine

## 2014-02-26 ENCOUNTER — Ambulatory Visit (INDEPENDENT_AMBULATORY_CARE_PROVIDER_SITE_OTHER): Payer: Medicare Other | Admitting: Internal Medicine

## 2014-02-26 VITALS — BP 122/62 | HR 60 | Temp 97.9°F | Ht 68.0 in | Wt 218.0 lb

## 2014-02-26 DIAGNOSIS — N529 Male erectile dysfunction, unspecified: Secondary | ICD-10-CM

## 2014-02-26 DIAGNOSIS — I10 Essential (primary) hypertension: Secondary | ICD-10-CM

## 2014-02-26 MED ORDER — NEBIVOLOL HCL 5 MG PO TABS: 5.0000 mg | ORAL_TABLET | Freq: Every day | ORAL | Status: DC

## 2014-02-26 MED ORDER — DOXAZOSIN MESYLATE 2 MG PO TABS: 2.0000 mg | ORAL_TABLET | Freq: Every day | ORAL | Status: DC

## 2014-02-26 MED ORDER — LORAZEPAM 0.5 MG PO TABS: 0.5000 mg | ORAL_TABLET | Freq: Every evening | ORAL | Status: DC | PRN

## 2014-02-26 NOTE — Progress Notes (Signed)
Subjective:    Patient ID: Ian [name], male    DOB: 1941-09-02, 73 y.o.   MRN: [id number]  HPI  73 year old white male with history of type 2 diabetes, hypertension and erectile dysfunction for followup. At previous visit his metoprolol was discontinued and patient started on Bystolic 5 mg once daily. Patient's blood pressure readings at home have trended lower.  Patient reports good response to 50 mg of Viagra. No significant side effects noted.  Review of Systems Negative for dizziness or lightheadedness    Past Medical History  Diagnosis Date  . Allergy   . Diabetes mellitus     type II  . Hyperlipidemia   . Hypertension   . Apnea, sleep 07/28/07    NPSG [place], AHI 79.9  . GERD (gastroesophageal reflux disease)   . Hypospadias   . Coronary heart disease 2008    Cath 2008, 60-70% proximal diagonal 1 stenosis,  RCA 40% stenosis.  nonischemic Lexiscan 03/2011  . Carotid artery stenosis     mild 04/2010, 05/2012  . Sleep apnea     History   Social History  . Marital Status: Married    Spouse Name: N/A    Number of Children: 3  . Years of Education: N/A   Occupational History  . Retired Solicitor    Social History Main Topics  . Smoking status: Never Smoker   . Smokeless tobacco: Never Used  . Alcohol Use: No  . Drug Use: No  . Sexual Activity: Not on file   Other Topics Concern  . Not on file   Social History Narrative   Married.  Three children from first marriage.  First wife died of ovarian cancer.      Past Surgical History  Procedure Laterality Date  . Tonsillectomy    . Nissen fundoplication    . Nasal sinus surgery    . Vein ligation and stripping      Family History  Problem Relation Age of Onset  . Coronary artery disease Father 63    CABG    Allergies  Allergen Reactions  . Iohexol      Desc: pt. states severe reaction, dr. told patient never to have the dye again     Current Outpatient Prescriptions on File Prior to  Visit  Medication Sig Dispense Refill  . amLODipine (NORVASC) 10 MG tablet Take 1 tablet (10 mg total) by mouth daily.  90 tablet  1  . aspirin 81 MG tablet Take 1 tablet (81 mg total) by mouth daily.  90 tablet  3  . atorvastatin (LIPITOR) 20 MG tablet TAKE 1 TABLET DAILY  90 tablet  2  . BENICAR 40 MG tablet TAKE 1 TABLET DAILY  90 tablet  2  . Coenzyme Q10 150 MG CAPS Take 1 capsule (150 mg total) by mouth daily.  90 capsule  3  . fexofenadine (ALLEGRA) 180 MG tablet Take 1 tablet (180 mg total) by mouth daily.  90 tablet  3  . finasteride (PROSCAR) 5 MG tablet Take 1 tablet (5 mg total) by mouth daily.  90 tablet  3  . gabapentin (NEURONTIN) 300 MG capsule Take 1 capsule (300 mg total) by mouth 2 (two) times daily.  180 capsule  1  . glimepiride (AMARYL) 1 MG tablet TAKE 1 TABLET TWICE A DAY  180 tablet  2  . Glucosamine-Chondroitin 1500-1200 MG/30ML LIQD Twice a day  480 mL  12  . glucose blood (FREESTYLE LITE) test strip 1  each by Other route as needed. Use to test blood sugar three times a day as instructed.  Dx. 250.00       . metFORMIN (GLUCOPHAGE) 500 MG tablet Take 2 tablets with AM meal and 1 tablet with PM meal  270 tablet  3  . Omega-3 Fatty Acids (FISH OIL) 1200 MG CAPS Take 1 capsule by mouth daily.      Marland Kitchen omeprazole (PRILOSEC) 20 MG capsule Take 1 capsule (20 mg total) by mouth 2 (two) times daily.  180 capsule  3  . sildenafil (VIAGRA) 100 MG tablet Take 0.5-1 tablets (50-100 mg total) by mouth daily as needed for erectile dysfunction.  5 tablet  11  . triamcinolone (KENALOG) 0.025 % cream Apply 1 application topically as needed.      . zolpidem (AMBIEN) 5 MG tablet Take 1 tablet (5 mg total) by mouth at bedtime as needed for sleep.  30 tablet  5   No current facility-administered medications on file prior to visit.    BP 122/62  Pulse 60  Temp(Src) 97.9 F (36.6 C) (Oral)  Ht 5\' 8"  (1.727 m)  Wt 218 lb (98.884 kg)  BMI 33.15 kg/m2    Objective:   Physical Exam    Constitutional: He is oriented to person, place, and time. He appears well-developed and well-nourished.  Cardiovascular: Normal rate, regular rhythm and normal heart sounds.   Pulmonary/Chest: Effort normal and breath sounds normal. He has no wheezes.  Musculoskeletal:  Trace lower extremity edema  Neurological: He is alert and oriented to person, place, and time. No cranial nerve deficit.  Psychiatric: He has a normal mood and affect. His behavior is normal.          Assessment & Plan:

## 2014-02-26 NOTE — Assessment & Plan Note (Signed)
BP improved with switching to Bystolic from metoprolol. BP: 122/62 mmHg

## 2014-02-26 NOTE — Patient Instructions (Signed)
Please complete the following lab tests before your next follow up appointment: BMET, A1c - 250.00 Take 1/2 dose of Amaryl or Glimepride

## 2014-02-26 NOTE — Assessment & Plan Note (Signed)
Good response to Viagra.  No significant side effects noted.

## 2014-02-26 NOTE — Progress Notes (Signed)
Pre visit review using our clinic review tool, if applicable. No additional management support is needed unless otherwise documented below in the visit note. 

## 2014-03-14 DIAGNOSIS — E119 Type 2 diabetes mellitus without complications: Secondary | ICD-10-CM | POA: Diagnosis not present

## 2014-03-14 DIAGNOSIS — H35039 Hypertensive retinopathy, unspecified eye: Secondary | ICD-10-CM | POA: Diagnosis not present

## 2014-03-14 DIAGNOSIS — H40019 Open angle with borderline findings, low risk, unspecified eye: Secondary | ICD-10-CM | POA: Diagnosis not present

## 2014-03-14 DIAGNOSIS — H2510 Age-related nuclear cataract, unspecified eye: Secondary | ICD-10-CM | POA: Diagnosis not present

## 2014-03-14 DIAGNOSIS — H524 Presbyopia: Secondary | ICD-10-CM | POA: Diagnosis not present

## 2014-03-14 DIAGNOSIS — H25019 Cortical age-related cataract, unspecified eye: Secondary | ICD-10-CM | POA: Diagnosis not present

## 2014-03-14 DIAGNOSIS — E11319 Type 2 diabetes mellitus with unspecified diabetic retinopathy without macular edema: Secondary | ICD-10-CM | POA: Diagnosis not present

## 2014-04-18 ENCOUNTER — Encounter: Payer: Self-pay | Admitting: Internal Medicine

## 2014-04-22 ENCOUNTER — Encounter: Payer: Self-pay | Admitting: Internal Medicine

## 2014-04-23 ENCOUNTER — Telehealth: Payer: Self-pay | Admitting: Internal Medicine

## 2014-04-23 MED ORDER — METFORMIN HCL 500 MG PO TABS
ORAL_TABLET | ORAL | Status: DC
Start: 2014-04-23 — End: 2014-05-09

## 2014-04-23 NOTE — Telephone Encounter (Signed)
[facility] [place], [place] is requesting re-fill on nebivolol (BYSTOLIC) 5 MG tablet

## 2014-04-23 NOTE — Telephone Encounter (Signed)
rx sent in electronically 

## 2014-04-26 ENCOUNTER — Telehealth: Payer: Self-pay | Admitting: Internal Medicine

## 2014-04-26 MED ORDER — NEBIVOLOL HCL 5 MG PO TABS: 5.0000 mg | ORAL_TABLET | Freq: Every day | ORAL | Status: DC

## 2014-04-26 NOTE — Telephone Encounter (Signed)
[facility] is requesting 90 day re-fill on nebivolol (BYSTOLIC) 5 MG tablet

## 2014-04-26 NOTE — Telephone Encounter (Signed)
rx sent in electronically 

## 2014-05-06 ENCOUNTER — Encounter: Payer: Self-pay | Admitting: Internal Medicine

## 2014-05-09 ENCOUNTER — Other Ambulatory Visit: Payer: Self-pay | Admitting: Internal Medicine

## 2014-05-09 DIAGNOSIS — E785 Hyperlipidemia, unspecified: Secondary | ICD-10-CM | POA: Diagnosis not present

## 2014-05-09 DIAGNOSIS — I2510 Atherosclerotic heart disease of native coronary artery without angina pectoris: Secondary | ICD-10-CM | POA: Diagnosis not present

## 2014-05-09 DIAGNOSIS — I6529 Occlusion and stenosis of unspecified carotid artery: Secondary | ICD-10-CM | POA: Diagnosis not present

## 2014-05-09 DIAGNOSIS — M722 Plantar fascial fibromatosis: Secondary | ICD-10-CM | POA: Diagnosis not present

## 2014-05-09 DIAGNOSIS — I10 Essential (primary) hypertension: Secondary | ICD-10-CM | POA: Diagnosis not present

## 2014-05-09 MED ORDER — METFORMIN HCL 500 MG PO TABS: ORAL_TABLET | ORAL | Status: DC

## 2014-05-15 DIAGNOSIS — M722 Plantar fascial fibromatosis: Secondary | ICD-10-CM | POA: Diagnosis not present

## 2014-05-16 DIAGNOSIS — M722 Plantar fascial fibromatosis: Secondary | ICD-10-CM | POA: Diagnosis not present

## 2014-05-18 DIAGNOSIS — M722 Plantar fascial fibromatosis: Secondary | ICD-10-CM | POA: Diagnosis not present

## 2014-05-21 DIAGNOSIS — M722 Plantar fascial fibromatosis: Secondary | ICD-10-CM | POA: Diagnosis not present

## 2014-05-22 ENCOUNTER — Other Ambulatory Visit: Payer: Self-pay | Admitting: Internal Medicine

## 2014-05-22 ENCOUNTER — Telehealth: Payer: Self-pay | Admitting: Internal Medicine

## 2014-05-22 NOTE — Telephone Encounter (Signed)
[facility] is requesting 90 day re-fill on sildenafil (VIAGRA) 100 MG tablet

## 2014-05-23 DIAGNOSIS — M722 Plantar fascial fibromatosis: Secondary | ICD-10-CM | POA: Diagnosis not present

## 2014-05-23 MED ORDER — SILDENAFIL CITRATE 100 MG PO TABS: 50.0000 mg | ORAL_TABLET | Freq: Every day | ORAL | Status: DC | PRN

## 2014-05-23 NOTE — Telephone Encounter (Signed)
rx sent in electronically 

## 2014-05-25 DIAGNOSIS — M722 Plantar fascial fibromatosis: Secondary | ICD-10-CM | POA: Diagnosis not present

## 2014-05-30 DIAGNOSIS — Z0000 Encounter for general adult medical examination without abnormal findings: Secondary | ICD-10-CM | POA: Diagnosis not present

## 2014-05-30 DIAGNOSIS — R0609 Other forms of dyspnea: Secondary | ICD-10-CM | POA: Diagnosis not present

## 2014-05-30 DIAGNOSIS — E669 Obesity, unspecified: Secondary | ICD-10-CM | POA: Diagnosis not present

## 2014-05-30 DIAGNOSIS — J9819 Other pulmonary collapse: Secondary | ICD-10-CM | POA: Diagnosis not present

## 2014-05-30 DIAGNOSIS — R059 Cough, unspecified: Secondary | ICD-10-CM | POA: Diagnosis not present

## 2014-05-30 DIAGNOSIS — M722 Plantar fascial fibromatosis: Secondary | ICD-10-CM | POA: Diagnosis not present

## 2014-05-30 DIAGNOSIS — R05 Cough: Secondary | ICD-10-CM | POA: Diagnosis not present

## 2014-05-30 DIAGNOSIS — R0989 Other specified symptoms and signs involving the circulatory and respiratory systems: Secondary | ICD-10-CM | POA: Diagnosis not present

## 2014-05-30 DIAGNOSIS — Z6833 Body mass index (BMI) 33.0-33.9, adult: Secondary | ICD-10-CM | POA: Diagnosis not present

## 2014-06-01 ENCOUNTER — Encounter: Payer: Self-pay | Admitting: Internal Medicine

## 2014-06-01 DIAGNOSIS — M722 Plantar fascial fibromatosis: Secondary | ICD-10-CM | POA: Diagnosis not present

## 2014-06-04 DIAGNOSIS — E785 Hyperlipidemia, unspecified: Secondary | ICD-10-CM | POA: Diagnosis not present

## 2014-06-04 DIAGNOSIS — F411 Generalized anxiety disorder: Secondary | ICD-10-CM | POA: Diagnosis not present

## 2014-06-04 DIAGNOSIS — Z79899 Other long term (current) drug therapy: Secondary | ICD-10-CM | POA: Diagnosis not present

## 2014-06-04 DIAGNOSIS — E119 Type 2 diabetes mellitus without complications: Secondary | ICD-10-CM | POA: Diagnosis not present

## 2014-06-04 DIAGNOSIS — M722 Plantar fascial fibromatosis: Secondary | ICD-10-CM | POA: Diagnosis not present

## 2014-06-04 DIAGNOSIS — I10 Essential (primary) hypertension: Secondary | ICD-10-CM | POA: Diagnosis not present

## 2014-06-04 DIAGNOSIS — Z1211 Encounter for screening for malignant neoplasm of colon: Secondary | ICD-10-CM | POA: Diagnosis not present

## 2014-06-05 ENCOUNTER — Telehealth: Payer: Self-pay | Admitting: Internal Medicine

## 2014-06-05 NOTE — Telephone Encounter (Signed)
Gina from [facility] called and wanted to get ppw completed for Diabetic Shoes and inserts for pt.  She has a copy that was previously faxed in to [facility], but she states it needs to have Dr. Shawna Orleans initials on LINE 4 and date of office visit.  She re-faxed ppw 06/05/14 and requests it be faxed to [phone] [facility].  Best number for Gina At [facility] [phone]

## 2014-06-06 DIAGNOSIS — M722 Plantar fascial fibromatosis: Secondary | ICD-10-CM | POA: Diagnosis not present

## 2014-06-06 DIAGNOSIS — Z1212 Encounter for screening for malignant neoplasm of rectum: Secondary | ICD-10-CM | POA: Diagnosis not present

## 2014-06-07 NOTE — Telephone Encounter (Signed)
This was faxed on 06/06/14

## 2014-06-08 DIAGNOSIS — M722 Plantar fascial fibromatosis: Secondary | ICD-10-CM | POA: Diagnosis not present

## 2014-06-11 DIAGNOSIS — M722 Plantar fascial fibromatosis: Secondary | ICD-10-CM | POA: Diagnosis not present

## 2014-06-15 DIAGNOSIS — M722 Plantar fascial fibromatosis: Secondary | ICD-10-CM | POA: Diagnosis not present

## 2014-06-18 DIAGNOSIS — M722 Plantar fascial fibromatosis: Secondary | ICD-10-CM | POA: Diagnosis not present

## 2014-06-20 DIAGNOSIS — M722 Plantar fascial fibromatosis: Secondary | ICD-10-CM | POA: Diagnosis not present

## 2014-06-22 DIAGNOSIS — M722 Plantar fascial fibromatosis: Secondary | ICD-10-CM | POA: Diagnosis not present

## 2014-06-25 DIAGNOSIS — M722 Plantar fascial fibromatosis: Secondary | ICD-10-CM | POA: Diagnosis not present

## 2014-06-27 DIAGNOSIS — M722 Plantar fascial fibromatosis: Secondary | ICD-10-CM | POA: Diagnosis not present

## 2014-06-29 DIAGNOSIS — M722 Plantar fascial fibromatosis: Secondary | ICD-10-CM | POA: Diagnosis not present

## 2014-07-04 DIAGNOSIS — M722 Plantar fascial fibromatosis: Secondary | ICD-10-CM | POA: Diagnosis not present

## 2014-07-06 ENCOUNTER — Other Ambulatory Visit: Payer: Self-pay | Admitting: Internal Medicine

## 2014-07-06 DIAGNOSIS — M722 Plantar fascial fibromatosis: Secondary | ICD-10-CM | POA: Diagnosis not present

## 2014-07-09 DIAGNOSIS — M722 Plantar fascial fibromatosis: Secondary | ICD-10-CM | POA: Diagnosis not present

## 2014-07-11 DIAGNOSIS — M722 Plantar fascial fibromatosis: Secondary | ICD-10-CM | POA: Diagnosis not present

## 2014-07-18 DIAGNOSIS — M722 Plantar fascial fibromatosis: Secondary | ICD-10-CM | POA: Diagnosis not present

## 2014-07-20 DIAGNOSIS — M722 Plantar fascial fibromatosis: Secondary | ICD-10-CM | POA: Diagnosis not present

## 2014-07-23 DIAGNOSIS — M722 Plantar fascial fibromatosis: Secondary | ICD-10-CM | POA: Diagnosis not present

## 2014-07-25 DIAGNOSIS — M722 Plantar fascial fibromatosis: Secondary | ICD-10-CM | POA: Diagnosis not present

## 2014-07-30 DIAGNOSIS — M722 Plantar fascial fibromatosis: Secondary | ICD-10-CM | POA: Diagnosis not present

## 2014-08-01 DIAGNOSIS — Z6833 Body mass index (BMI) 33.0-33.9, adult: Secondary | ICD-10-CM | POA: Diagnosis not present

## 2014-08-01 DIAGNOSIS — E785 Hyperlipidemia, unspecified: Secondary | ICD-10-CM | POA: Diagnosis not present

## 2014-08-01 DIAGNOSIS — M722 Plantar fascial fibromatosis: Secondary | ICD-10-CM | POA: Diagnosis not present

## 2014-08-01 DIAGNOSIS — E119 Type 2 diabetes mellitus without complications: Secondary | ICD-10-CM | POA: Diagnosis not present

## 2014-08-01 DIAGNOSIS — I10 Essential (primary) hypertension: Secondary | ICD-10-CM | POA: Diagnosis not present

## 2014-08-03 DIAGNOSIS — M722 Plantar fascial fibromatosis: Secondary | ICD-10-CM | POA: Diagnosis not present

## 2014-08-06 DIAGNOSIS — M722 Plantar fascial fibromatosis: Secondary | ICD-10-CM | POA: Diagnosis not present

## 2014-08-08 DIAGNOSIS — M722 Plantar fascial fibromatosis: Secondary | ICD-10-CM | POA: Diagnosis not present

## 2014-09-01 ENCOUNTER — Other Ambulatory Visit: Payer: Self-pay | Admitting: Internal Medicine

## 2014-09-04 DIAGNOSIS — Z23 Encounter for immunization: Secondary | ICD-10-CM | POA: Diagnosis not present

## 2014-09-25 DIAGNOSIS — L219 Seborrheic dermatitis, unspecified: Secondary | ICD-10-CM | POA: Diagnosis not present

## 2014-10-17 ENCOUNTER — Ambulatory Visit: Payer: Medicare Other | Admitting: Internal Medicine

## 2014-10-17 DIAGNOSIS — H40013 Open angle with borderline findings, low risk, bilateral: Secondary | ICD-10-CM | POA: Diagnosis not present

## 2014-11-12 ENCOUNTER — Encounter: Payer: Self-pay | Admitting: Internal Medicine

## 2014-11-12 DIAGNOSIS — E1149 Type 2 diabetes mellitus with other diabetic neurological complication: Secondary | ICD-10-CM

## 2014-11-16 ENCOUNTER — Ambulatory Visit: Payer: Medicare Other | Admitting: Internal Medicine

## 2014-11-29 ENCOUNTER — Other Ambulatory Visit (INDEPENDENT_AMBULATORY_CARE_PROVIDER_SITE_OTHER): Payer: Medicare Other

## 2014-11-29 DIAGNOSIS — E1149 Type 2 diabetes mellitus with other diabetic neurological complication: Secondary | ICD-10-CM | POA: Diagnosis not present

## 2014-11-29 LAB — BASIC METABOLIC PANEL
BUN: 22 mg/dL (ref 6–23)
CO2: 30 mEq/L (ref 19–32)
Calcium: 9.4 mg/dL (ref 8.4–10.5)
Chloride: 105 mEq/L (ref 96–112)
Creatinine, Ser: 1.08 mg/dL (ref 0.40–1.50)
GFR: 70.75 mL/min (ref >60.00)
Glucose, Bld: 131 mg/dL — ABNORMAL HIGH (ref 70–99)
Potassium: 4.2 mEq/L (ref 3.5–5.1)
Sodium: 140 mEq/L (ref 135–145)

## 2014-11-29 LAB — HEMOGLOBIN A1C: Hgb A1c MFr Bld: 6.3 % (ref 4.6–6.5)

## 2014-12-07 ENCOUNTER — Ambulatory Visit: Payer: Medicare Other | Admitting: Internal Medicine

## 2014-12-11 ENCOUNTER — Ambulatory Visit (INDEPENDENT_AMBULATORY_CARE_PROVIDER_SITE_OTHER): Payer: Medicare Other | Admitting: Family Medicine

## 2014-12-11 ENCOUNTER — Encounter: Payer: Self-pay | Admitting: Family Medicine

## 2014-12-11 ENCOUNTER — Ambulatory Visit: Payer: Medicare Other | Admitting: Family Medicine

## 2014-12-11 VITALS — BP 122/68 | HR 53 | Temp 98.6°F | Ht 68.0 in | Wt 215.0 lb

## 2014-12-11 DIAGNOSIS — N259 Disorder resulting from impaired renal tubular function, unspecified: Secondary | ICD-10-CM | POA: Diagnosis not present

## 2014-12-11 DIAGNOSIS — K589 Irritable bowel syndrome without diarrhea: Secondary | ICD-10-CM

## 2014-12-11 DIAGNOSIS — I2510 Atherosclerotic heart disease of native coronary artery without angina pectoris: Secondary | ICD-10-CM | POA: Diagnosis not present

## 2014-12-11 DIAGNOSIS — E119 Type 2 diabetes mellitus without complications: Secondary | ICD-10-CM | POA: Diagnosis not present

## 2014-12-11 DIAGNOSIS — I10 Essential (primary) hypertension: Secondary | ICD-10-CM

## 2014-12-11 NOTE — Progress Notes (Signed)
   Subjective:    Patient ID: Ian [name], male    DOB: Jan 01, 1941, 74 y.o.   MRN: [id number]  HPI This is a patient of Dr. Shawna Orleans here for a 6 month follow up. He feels well except he asks about alternating bowel habits. Over the past year he has developed a pattern of not having a BM for 4 or 5 days and then having a day or two of passing 5-6 loose BMs in a day. If he is under any stress he will often have diarrhea. He had colonoscopies in [place] before moving here that were unremarkable, but he is not sure when he is due for another one. His BP has been stable. He recently had a BMET to check his renal function, and this was stable. His recent A1c was excellent at 6.3.    Review of Systems  Constitutional: Negative.   Respiratory: Negative.   Cardiovascular: Negative.   Gastrointestinal: Positive for diarrhea and constipation. Negative for nausea, vomiting, abdominal pain, blood in stool, abdominal distention, anal bleeding and rectal pain.       Objective:   Physical Exam  Constitutional: He appears well-developed and well-nourished.  Cardiovascular: Normal rate, regular rhythm, normal heart sounds and intact distal pulses.   Pulmonary/Chest: Effort normal and breath sounds normal.  Abdominal: Soft. Bowel sounds are normal. He exhibits no distension and no mass. There is no tenderness. There is no rebound and no guarding.          Assessment & Plan:  His HTN and diabetes are doing well. He appears to have IBS, so I suggested he try taking Miralax daily to avoid getting the bowels backed up. He will contact his old doctor office in [place] to get records of his last colonoscopy and will forward these to [place]. He will return in April for a cpx.

## 2014-12-11 NOTE — Progress Notes (Signed)
Pre visit review using our clinic review tool, if applicable. No additional management support is needed unless otherwise documented below in the visit note. 

## 2014-12-12 DIAGNOSIS — K589 Irritable bowel syndrome without diarrhea: Secondary | ICD-10-CM | POA: Insufficient documentation

## 2014-12-12 MED ORDER — SILDENAFIL CITRATE 100 MG PO TABS: 50.0000 mg | ORAL_TABLET | Freq: Every day | ORAL | Status: DC | PRN

## 2014-12-12 MED ORDER — ZOLPIDEM TARTRATE 5 MG PO TABS: ORAL_TABLET | ORAL | Status: DC

## 2014-12-12 MED ORDER — GABAPENTIN 300 MG PO CAPS: 300.0000 mg | ORAL_CAPSULE | Freq: Two times a day (BID) | ORAL | Status: DC

## 2014-12-12 MED ORDER — ATORVASTATIN CALCIUM 20 MG PO TABS: 20.0000 mg | ORAL_TABLET | Freq: Every day | ORAL | Status: DC

## 2014-12-12 MED ORDER — LORAZEPAM 0.5 MG PO TABS: 0.5000 mg | ORAL_TABLET | Freq: Every evening | ORAL | Status: DC | PRN

## 2014-12-12 MED ORDER — GLIMEPIRIDE 1 MG PO TABS: 1.0000 mg | ORAL_TABLET | Freq: Two times a day (BID) | ORAL | Status: DC

## 2014-12-12 MED ORDER — NEBIVOLOL HCL 5 MG PO TABS: 5.0000 mg | ORAL_TABLET | Freq: Every day | ORAL | Status: DC

## 2014-12-12 MED ORDER — FINASTERIDE 5 MG PO TABS: 5.0000 mg | ORAL_TABLET | Freq: Every day | ORAL | Status: DC

## 2014-12-12 MED ORDER — DOXAZOSIN MESYLATE 2 MG PO TABS: 2.0000 mg | ORAL_TABLET | Freq: Every day | ORAL | Status: DC

## 2014-12-12 MED ORDER — OLMESARTAN MEDOXOMIL 40 MG PO TABS: 40.0000 mg | ORAL_TABLET | Freq: Every day | ORAL | Status: DC

## 2014-12-12 MED ORDER — METFORMIN HCL 500 MG PO TABS: ORAL_TABLET | ORAL | Status: DC

## 2014-12-12 MED ORDER — AMLODIPINE BESYLATE 10 MG PO TABS: 10.0000 mg | ORAL_TABLET | Freq: Every day | ORAL | Status: DC

## 2014-12-13 ENCOUNTER — Encounter: Payer: Self-pay | Admitting: Internal Medicine

## 2015-01-15 ENCOUNTER — Encounter: Payer: Self-pay | Admitting: Internal Medicine

## 2015-01-16 MED ORDER — GLUCOSE BLOOD VI STRP: ORAL_STRIP | Status: DC

## 2015-02-14 ENCOUNTER — Encounter: Payer: Self-pay | Admitting: Internal Medicine

## 2015-02-14 ENCOUNTER — Telehealth: Payer: Self-pay | Admitting: Internal Medicine

## 2015-02-14 DIAGNOSIS — E785 Hyperlipidemia, unspecified: Secondary | ICD-10-CM

## 2015-02-14 DIAGNOSIS — I10 Essential (primary) hypertension: Secondary | ICD-10-CM

## 2015-02-14 DIAGNOSIS — E119 Type 2 diabetes mellitus without complications: Secondary | ICD-10-CM

## 2015-02-14 NOTE — Telephone Encounter (Signed)
Pt is asking if need to have labs done before he see Dr Shawna Orleans on 03/22/15. He scheduled a 3 month fup

## 2015-02-14 NOTE — Telephone Encounter (Signed)
Please advise 

## 2015-02-14 NOTE — Telephone Encounter (Signed)
Future orders placed, pt notified via mychart

## 2015-02-14 NOTE — Telephone Encounter (Signed)
Yes  BMET, A1c, microalbumin cr ratio - DM II FLP, LFTs, TSH - hyperlipidemia CBCD - hypertension

## 2015-03-20 ENCOUNTER — Other Ambulatory Visit (INDEPENDENT_AMBULATORY_CARE_PROVIDER_SITE_OTHER): Payer: Medicare Other

## 2015-03-20 DIAGNOSIS — E119 Type 2 diabetes mellitus without complications: Secondary | ICD-10-CM | POA: Diagnosis not present

## 2015-03-20 DIAGNOSIS — E11319 Type 2 diabetes mellitus with unspecified diabetic retinopathy without macular edema: Secondary | ICD-10-CM | POA: Diagnosis not present

## 2015-03-20 DIAGNOSIS — H40013 Open angle with borderline findings, low risk, bilateral: Secondary | ICD-10-CM | POA: Diagnosis not present

## 2015-03-20 DIAGNOSIS — H2513 Age-related nuclear cataract, bilateral: Secondary | ICD-10-CM | POA: Diagnosis not present

## 2015-03-20 DIAGNOSIS — I10 Essential (primary) hypertension: Secondary | ICD-10-CM

## 2015-03-20 DIAGNOSIS — E785 Hyperlipidemia, unspecified: Secondary | ICD-10-CM

## 2015-03-20 DIAGNOSIS — H25013 Cortical age-related cataract, bilateral: Secondary | ICD-10-CM | POA: Diagnosis not present

## 2015-03-20 LAB — CBC WITH DIFFERENTIAL/PLATELET
Basophils Absolute: 0 10*3/uL (ref 0.0–0.1)
Basophils Relative: 0.5 % (ref 0.0–3.0)
Eosinophils Absolute: 0.3 10*3/uL (ref 0.0–0.7)
Eosinophils Relative: 4.4 % (ref 0.0–5.0)
HCT: 42.6 % (ref 39.0–52.0)
Hemoglobin: 14.4 g/dL (ref 13.0–17.0)
Lymphocytes Relative: 24.1 % (ref 12.0–46.0)
Lymphs Abs: 1.5 10*3/uL (ref 0.7–4.0)
MCHC: 33.9 g/dL (ref 30.0–36.0)
MCV: 86.4 fl (ref 78.0–100.0)
Monocytes Absolute: 0.4 10*3/uL (ref 0.1–1.0)
Monocytes Relative: 7.1 % (ref 3.0–12.0)
Neutro Abs: 3.9 10*3/uL (ref 1.4–7.7)
Neutrophils Relative %: 63.9 % (ref 43.0–77.0)
Platelets: 190 10*3/uL (ref 150.0–400.0)
RBC: 4.93 Mil/uL (ref 4.22–5.81)
RDW: 14 % (ref 11.5–15.5)
WBC: 6.2 10*3/uL (ref 4.0–10.5)

## 2015-03-20 LAB — BASIC METABOLIC PANEL
BUN: 16 mg/dL (ref 6–23)
CO2: 29 mEq/L (ref 19–32)
Calcium: 9.7 mg/dL (ref 8.4–10.5)
Chloride: 105 mEq/L (ref 96–112)
Creatinine, Ser: 0.95 mg/dL (ref 0.40–1.50)
GFR: 81.97 mL/min (ref >60.00)
Glucose, Bld: 109 mg/dL — ABNORMAL HIGH (ref 70–99)
Potassium: 4.5 mEq/L (ref 3.5–5.1)
Sodium: 142 mEq/L (ref 135–145)

## 2015-03-20 LAB — TSH: TSH: 0.59 u[IU]/mL (ref 0.35–4.50)

## 2015-03-20 LAB — LIPID PANEL
Cholesterol: 187 mg/dL (ref 0–200)
HDL: 43.6 mg/dL (ref >39.00)
LDL Cholesterol: 110 mg/dL — ABNORMAL HIGH (ref 0–99)
NonHDL: 143.4
Total CHOL/HDL Ratio: 4
Triglycerides: 166 mg/dL — ABNORMAL HIGH (ref 0.0–149.0)
VLDL: 33.2 mg/dL (ref 0.0–40.0)

## 2015-03-20 LAB — HEPATIC FUNCTION PANEL
ALT: 18 U/L (ref 0–53)
AST: 19 U/L (ref 0–37)
Albumin: 4.2 g/dL (ref 3.5–5.2)
Alkaline Phosphatase: 43 U/L (ref 39–117)
Bilirubin, Direct: 0.2 mg/dL (ref 0.0–0.3)
Total Bilirubin: 1.3 mg/dL — ABNORMAL HIGH (ref 0.2–1.2)
Total Protein: 7 g/dL (ref 6.0–8.3)

## 2015-03-20 LAB — MICROALBUMIN / CREATININE URINE RATIO
Creatinine,U: 101.4 mg/dL
Microalb Creat Ratio: 0.7 mg/g (ref 0.0–30.0)
Microalb, Ur: 0.7 mg/dL (ref 0.0–1.9)

## 2015-03-20 LAB — HM DIABETES EYE EXAM

## 2015-03-20 LAB — HEMOGLOBIN A1C: Hgb A1c MFr Bld: 6.1 % (ref 4.6–6.5)

## 2015-03-21 ENCOUNTER — Encounter: Payer: Self-pay | Admitting: Internal Medicine

## 2015-03-22 ENCOUNTER — Ambulatory Visit: Payer: PRIVATE HEALTH INSURANCE | Admitting: Internal Medicine

## 2015-03-22 ENCOUNTER — Encounter: Payer: Self-pay | Admitting: Family Medicine

## 2015-03-22 ENCOUNTER — Ambulatory Visit (INDEPENDENT_AMBULATORY_CARE_PROVIDER_SITE_OTHER): Payer: Medicare Other | Admitting: Family Medicine

## 2015-03-22 VITALS — BP 131/71 | HR 57 | Temp 98.4°F | Ht 68.0 in | Wt 211.0 lb

## 2015-03-22 DIAGNOSIS — I10 Essential (primary) hypertension: Secondary | ICD-10-CM | POA: Diagnosis not present

## 2015-03-22 DIAGNOSIS — E119 Type 2 diabetes mellitus without complications: Secondary | ICD-10-CM | POA: Diagnosis not present

## 2015-03-22 DIAGNOSIS — I2510 Atherosclerotic heart disease of native coronary artery without angina pectoris: Secondary | ICD-10-CM

## 2015-03-22 MED ORDER — GLIMEPIRIDE 1 MG PO TABS: 1.0000 mg | ORAL_TABLET | Freq: Two times a day (BID) | ORAL | Status: DC

## 2015-03-22 MED ORDER — OMEPRAZOLE 20 MG PO CPDR: DELAYED_RELEASE_CAPSULE | ORAL | Status: DC

## 2015-03-22 MED ORDER — FEXOFENADINE HCL 180 MG PO TABS: 180.0000 mg | ORAL_TABLET | Freq: Every day | ORAL | Status: DC

## 2015-03-22 MED ORDER — AMLODIPINE BESYLATE 10 MG PO TABS: 10.0000 mg | ORAL_TABLET | Freq: Every day | ORAL | Status: DC

## 2015-03-22 MED ORDER — ZOLPIDEM TARTRATE 5 MG PO TABS: ORAL_TABLET | ORAL | Status: DC

## 2015-03-22 MED ORDER — ATORVASTATIN CALCIUM 20 MG PO TABS: 20.0000 mg | ORAL_TABLET | Freq: Every day | ORAL | Status: DC

## 2015-03-22 MED ORDER — OLMESARTAN MEDOXOMIL 40 MG PO TABS: 40.0000 mg | ORAL_TABLET | Freq: Every day | ORAL | Status: DC

## 2015-03-22 MED ORDER — GABAPENTIN 300 MG PO CAPS: 300.0000 mg | ORAL_CAPSULE | Freq: Two times a day (BID) | ORAL | Status: DC

## 2015-03-22 MED ORDER — NEBIVOLOL HCL 5 MG PO TABS: 5.0000 mg | ORAL_TABLET | Freq: Every day | ORAL | Status: DC

## 2015-03-22 MED ORDER — DOXAZOSIN MESYLATE 2 MG PO TABS: 2.0000 mg | ORAL_TABLET | Freq: Every day | ORAL | Status: DC

## 2015-03-22 MED ORDER — METFORMIN HCL 500 MG PO TABS: ORAL_TABLET | ORAL | Status: DC

## 2015-03-22 MED ORDER — LORAZEPAM 0.5 MG PO TABS: 0.5000 mg | ORAL_TABLET | Freq: Every evening | ORAL | Status: DC | PRN

## 2015-03-22 MED ORDER — FINASTERIDE 5 MG PO TABS: 5.0000 mg | ORAL_TABLET | Freq: Every day | ORAL | Status: DC

## 2015-03-22 NOTE — Progress Notes (Signed)
   Subjective:    Patient ID: Ian [name], male    DOB: 1941/02/06, 74 y.o.   MRN: [id number]  HPI Here for refills. He normally sees Dr. Shawna Orleans but we are seeing him in Dr. Lora Havens absence. He has been feeling well and his BP is stable. He had labs recently showing normal renal function and an A1c of 6.1. He and his wife spend every summer in [place] and they will be leaving in a few weeks.    Review of Systems  Constitutional: Negative.   Respiratory: Negative.   Cardiovascular: Negative.        Objective:   Physical Exam  Constitutional: He appears well-developed and well-nourished.  Cardiovascular: Normal rate, regular rhythm, normal heart sounds and intact distal pulses.   Pulmonary/Chest: Effort normal and breath sounds normal.          Assessment & Plan:  He is doing well. Meds were refilled.

## 2015-03-22 NOTE — Progress Notes (Signed)
Pre visit review using our clinic review tool, if applicable. No additional management support is needed unless otherwise documented below in the visit note. 

## 2015-04-05 ENCOUNTER — Ambulatory Visit (INDEPENDENT_AMBULATORY_CARE_PROVIDER_SITE_OTHER): Payer: Medicare Other | Admitting: Adult Health

## 2015-04-05 ENCOUNTER — Encounter: Payer: Self-pay | Admitting: Adult Health

## 2015-04-05 VITALS — BP 100/76 | Temp 97.5°F | Ht 68.0 in | Wt 209.7 lb

## 2015-04-05 DIAGNOSIS — I2510 Atherosclerotic heart disease of native coronary artery without angina pectoris: Secondary | ICD-10-CM | POA: Diagnosis not present

## 2015-04-05 DIAGNOSIS — N3945 Continuous leakage: Secondary | ICD-10-CM

## 2015-04-05 DIAGNOSIS — Z7689 Persons encountering health services in other specified circumstances: Secondary | ICD-10-CM | POA: Insufficient documentation

## 2015-04-05 DIAGNOSIS — Z7189 Other specified counseling: Secondary | ICD-10-CM

## 2015-04-05 DIAGNOSIS — E1142 Type 2 diabetes mellitus with diabetic polyneuropathy: Secondary | ICD-10-CM | POA: Diagnosis not present

## 2015-04-05 DIAGNOSIS — I10 Essential (primary) hypertension: Secondary | ICD-10-CM

## 2015-04-05 DIAGNOSIS — E119 Type 2 diabetes mellitus without complications: Secondary | ICD-10-CM | POA: Diagnosis not present

## 2015-04-05 MED ORDER — MIRABEGRON ER 25 MG PO TB24: 25.0000 mg | ORAL_TABLET | Freq: Every day | ORAL | Status: DC

## 2015-04-05 NOTE — Progress Notes (Signed)
HPI:  Ian [name] is here to establish care. He is a healthy, non smoking caucasian male. He spends his winters in [place] and his summers in [place].    Last PCP and physical:  2014?  Immunizations:UTD  Diet:Heart healthy diet.  Exercise:He exercises  Colonoscopy:4 years ago in [place]  Dexa: Never done.    Has the following chronic problems that require follow up and concerns today:   HTN   Blood pressure is well controlled on current medications. Denies any high or low feelings related to blood pressure.   Diabetes  Last A1c is 6. 1. Well controlled. He has occasional low blood sugars. He would also like a diabetic foot exam and prescription for diabetic shoes and insoles.   Urinary Incontinence  He has a history of BPH and Hypospadias. He is currently taking Proscar and Cardura. He endorses that he continues to have "dribbling" which seems to be more of an inconvenience, but does have occasions where his skin becomes irritated from the incontinence.     ROS negative for unless reported above: fevers, chills,feeling poorly, unintentional weight loss, hearing or vision loss, chest pain, palpitations, leg claudication, struggling to breath,Not feeling congested in the chest, no orthopenia, no cough,no wheezing, normal appetite, no soft tissue swelling, no hemoptysis, melena, hematochezia, hematuria, falls, loc, si, or thoughts of self harm.     Past Medical History  Diagnosis Date  . Allergy   . Diabetes mellitus     type II  . Hyperlipidemia   . Hypertension   . Apnea, sleep 07/28/07    [facility] [place], A[facility] 79.9  . GERD (gastroesophageal reflux disease)   . Hypospadias   . Coronary heart disease 2008    Cath 2008, 60-70% proximal diagonal 1 stenosis,  RCA 40% stenosis.  nonischemic Lexiscan 03/2011  . Carotid artery stenosis     mild 04/2010, 05/2012  . Sleep apnea     Past Surgical History  Procedure Laterality Date  . Tonsillectomy    .  Nissen fundoplication    . Nasal sinus surgery    . Vein ligation and stripping      Family History  Problem Relation Age of Onset  . Coronary artery disease Father 71    CABG    History   Social History  . Marital Status: Married    Spouse Name: N/A  . Number of Children: 3  . Years of Education: N/A   Occupational History  . Retired Solicitor    Social History Main Topics  . Smoking status: Never Smoker   . Smokeless tobacco: Never Used  . Alcohol Use: No  . Drug Use: No  . Sexual Activity: Not on file   Other Topics Concern  . Not on file   Social History Narrative   Married.  Three children from first marriage.  First wife died of ovarian cancer.       Current outpatient prescriptions:  .  amLODipine (NORVASC) 10 MG tablet, Take 1 tablet (10 mg total) by mouth daily., Disp: 90 tablet, Rfl: 2 .  aspirin 81 MG tablet, Take 1 tablet (81 mg total) by mouth daily., Disp: 90 tablet, Rfl: 3 .  atorvastatin (LIPITOR) 20 MG tablet, Take 1 tablet (20 mg total) by mouth daily., Disp: 90 tablet, Rfl: 2 .  Coenzyme Q10 150 MG CAPS, Take 1 capsule (150 mg total) by mouth daily., Disp: 90 capsule, Rfl: 3 .  doxazosin (CARDURA) 2 MG tablet, Take 1  tablet (2 mg total) by mouth at bedtime., Disp: 90 tablet, Rfl: 2 .  fexofenadine (ALLEGRA) 180 MG tablet, Take 1 tablet (180 mg total) by mouth daily., Disp: 90 tablet, Rfl: 2 .  finasteride (PROSCAR) 5 MG tablet, Take 1 tablet (5 mg total) by mouth daily., Disp: 90 tablet, Rfl: 2 .  gabapentin (NEURONTIN) 300 MG capsule, Take 1 capsule (300 mg total) by mouth 2 (two) times daily., Disp: 180 capsule, Rfl: 2 .  glimepiride (AMARYL) 1 MG tablet, Take 1 tablet (1 mg total) by mouth 2 (two) times daily., Disp: 180 tablet, Rfl: 0 .  Glucosamine-Chondroitin 1500-1200 MG/30ML LIQD, Twice a day, Disp: 480 mL, Rfl: 12 .  glucose blood (FREESTYLE LITE) test strip, Test three times daily, Disp: 50 each, Rfl: 3 .  LORazepam (ATIVAN) 0.5 MG  tablet, Take 1 tablet (0.5 mg total) by mouth at bedtime as needed., Disp: 90 tablet, Rfl: 2 .  metFORMIN (GLUCOPHAGE) 500 MG tablet, Take 2 tablets with AM meal and 1 tablet with PM meal, Disp: 270 tablet, Rfl: 2 .  nebivolol (BYSTOLIC) 5 MG tablet, Take 1 tablet (5 mg total) by mouth daily., Disp: 90 tablet, Rfl: 2 .  olmesartan (BENICAR) 40 MG tablet, Take 1 tablet (40 mg total) by mouth daily., Disp: 90 tablet, Rfl: 2 .  Omega-3 Fatty Acids (FISH OIL) 1200 MG CAPS, Take 1 capsule by mouth daily., Disp: , Rfl:  .  omeprazole (PRILOSEC) 20 MG capsule, 1 capsule twice daily, Disp: 180 capsule, Rfl: 2 .  sildenafil (VIAGRA) 100 MG tablet, Take 0.5-1 tablets (50-100 mg total) by mouth daily as needed for erectile dysfunction., Disp: 15 tablet, Rfl: 0 .  triamcinolone (KENALOG) 0.025 % cream, Apply 1 application topically as needed., Disp: , Rfl:  .  zolpidem (AMBIEN) 5 MG tablet, TAKE 1 TABLET BY MOUTH AT BEDTIME AS NEEDED FOR SLEEF, Disp: 90 tablet, Rfl: 1  EXAM:  There were no vitals filed for this visit.  There is no weight on file to calculate BMI.  GENERAL: vitals reviewed and listed above, alert, oriented, appears well hydrated and in no acute distress. He is slightly obese around the abdomen.  HEENT: atraumatic, conjunttiva clear, no obvious abnormalities on inspection of external nose and ears. No cerumen impaction in bilateral ears. Is wearing glasses.   NECK: Neck is soft and supple without masses, no adenopathy or thyromegaly, trachea midline, Normal range of motion.   LUNGS: clear to auscultation bilaterally, no wheezes, rales or rhonchi, good air movement  CV: Regular rate and rhythm, normal S1/S2, no audible murmurs, gallops, or rubs. No carotid bruit and no peripheral edema.   MS: moves all extremities without noticeable abnormality. No edema noted  Abd: soft/nontender/nondistended/normal bowel sounds   Skin: warm and dry, no rash   Extremities: No clubbing, cyanosis,  or edema. Capillary refill is WNL. Pulses intact bilaterally in upper and lower extremities.   Neuro: CN II-XII intact, sensation and reflexes normal throughout, 5/5 muscle strength in bilateral upper and lower extremities. Normal finger to nose. Normal rapid alternating movements. Normal romberg. No pronator drift.   PSYCH: pleasant and cooperative, no obvious depression or anxiety  ASSESSMENT AND PLAN:  Discussed the following assessment and plan:  No diagnosis found. -We reviewed the PMH, PSH, FH, SH, Meds and Allergies. -We provided refills for any medications we will prescribe as needed. -We addressed current concerns per orders and patient instructions. -We have asked for records for pertinent exams, studies, vaccines and notes from  previous providers. -We have advised patient to follow up per instructions below.   -Patient advised to return or notify a provider immediately if symptoms worsen or persist or new concerns arise.  There are no Patient Instructions on file for this visit.   [employer]

## 2015-04-05 NOTE — Patient Instructions (Addendum)
It was great meeting you today. Let's try Myrbetriq for your urinary incontinence. Please stop the Cardura ( doxazosin). Monitor your blood pressure and let me know if it is getting above 130/80.   Follow up with me when you are back from [place] for the summer. Please do not hesitate to call me if you need anything.       Health Maintenance A healthy lifestyle and preventative care can promote health and wellness.  Maintain regular health, dental, and eye exams.  Eat a healthy diet. Foods like vegetables, fruits, whole grains, low-fat dairy products, and lean protein foods contain the nutrients you need and are low in calories. Decrease your intake of foods high in solid fats, added sugars, and salt. Get information about a proper diet from your health care provider, if necessary.  Regular physical exercise is one of the most important things you can do for your health. Most adults should get at least 150 minutes of moderate-intensity exercise (any activity that increases your heart rate and causes you to sweat) each week. In addition, most adults need muscle-strengthening exercises on 2 or more days a week.   Maintain a healthy weight. The body mass index (BMI) is a screening tool to identify possible weight problems. It provides an estimate of body fat based on height and weight. Your health care provider can find your BMI and can help you achieve or maintain a healthy weight. For males 20 years and older:  A BMI below 18.5 is considered underweight.  A BMI of 18.5 to 24.9 is normal.  A BMI of 25 to 29.9 is considered overweight.  A BMI of 30 and above is considered obese.  Maintain normal blood lipids and cholesterol by exercising and minimizing your intake of saturated fat. Eat a balanced diet with plenty of fruits and vegetables. Blood tests for lipids and cholesterol should begin at age 66 and be repeated every 5 years. If your lipid or cholesterol levels are high, you are over  age 71, or you are at high risk for heart disease, you may need your cholesterol levels checked more frequently.Ongoing high lipid and cholesterol levels should be treated with medicines if diet and exercise are not working.  If you smoke, find out from your health care provider how to quit. If you do not use tobacco, do not start.  Lung cancer screening is recommended for adults aged 28-80 years who are at high risk for developing lung cancer because of a history of smoking. A yearly low-dose CT scan of the lungs is recommended for people who have at least a 30-pack-year history of smoking and are current smokers or have quit within the past 15 years. A pack year of smoking is smoking an average of 1 pack of cigarettes a day for 1 year (for example, a 30-pack-year history of smoking could mean smoking 1 pack a day for 30 years or 2 packs a day for 15 years). Yearly screening should continue until the smoker has stopped smoking for at least 15 years. Yearly screening should be stopped for people who develop a health problem that would prevent them from having lung cancer treatment.  If you choose to drink alcohol, do not have more than 2 drinks per day. One drink is considered to be 12 oz (360 mL) of beer, 5 oz (150 mL) of wine, or 1.5 oz (45 mL) of liquor.  Avoid the use of street drugs. Do not share needles with anyone. Ask for help  if you need support or instructions about stopping the use of drugs.  High blood pressure causes heart disease and increases the risk of stroke. Blood pressure should be checked at least every 1-2 years. Ongoing high blood pressure should be treated with medicines if weight loss and exercise are not effective.  If you are 80-5 years old, ask your health care provider if you should take aspirin to prevent heart disease.  Diabetes screening involves taking a blood sample to check your fasting blood sugar level. This should be done once every 3 years after age 86 if you  are at a normal weight and without risk factors for diabetes. Testing should be considered at a younger age or be carried out more frequently if you are overweight and have at least 1 risk factor for diabetes.  Colorectal cancer can be detected and often prevented. Most routine colorectal cancer screening begins at the age of 43 and continues through age 73. However, your health care provider may recommend screening at an earlier age if you have risk factors for colon cancer. On a yearly basis, your health care provider may provide home test kits to check for hidden blood in the stool. A small camera at the end of a tube may be used to directly examine the colon (sigmoidoscopy or colonoscopy) to detect the earliest forms of colorectal cancer. Talk to your health care provider about this at age 28 when routine screening begins. A direct exam of the colon should be repeated every 5-10 years through age 76, unless early forms of precancerous polyps or small growths are found.  People who are at an increased risk for hepatitis B should be screened for this virus. You are considered at high risk for hepatitis B if:  You were born in a country where hepatitis B occurs often. Talk with your health care provider about which countries are considered high risk.  Your parents were born in a high-risk country and you have not received a shot to protect against hepatitis B (hepatitis B vaccine).  You have HIV or AIDS.  You use needles to inject street drugs.  You live with, or have sex with, someone who has hepatitis B.  You are a man who has sex with other men (MSM).  You get hemodialysis treatment.  You take certain medicines for conditions like cancer, organ transplantation, and autoimmune conditions.  Hepatitis C blood testing is recommended for all people born from [birth year] through 1967 and any individual with known risk factors for hepatitis C.  Healthy men should no longer receive prostate-specific  antigen (PSA) blood tests as part of routine cancer screening. Talk to your health care provider about prostate cancer screening.  Testicular cancer screening is not recommended for adolescents or adult males who have no symptoms. Screening includes self-exam, a health care provider exam, and other screening tests. Consult with your health care provider about any symptoms you have or any concerns you have about testicular cancer.  Practice safe sex. Use condoms and avoid high-risk sexual practices to reduce the spread of sexually transmitted infections (STIs).  You should be screened for STIs, including gonorrhea and chlamydia if:  You are sexually active and are younger than 24 years.  You are older than 24 years, and your health care provider tells you that you are at risk for this type of infection.  Your sexual activity has changed since you were last screened, and you are at an increased risk for chlamydia or gonorrhea. Ask  your health care provider if you are at risk.  If you are at risk of being infected with HIV, it is recommended that you take a prescription medicine daily to prevent HIV infection. This is called pre-exposure prophylaxis (PrEP). You are considered at risk if:  You are a man who has sex with other men (MSM).  You are a heterosexual man who is sexually active with multiple partners.  You take drugs by injection.  You are sexually active with a partner who has HIV.  Talk with your health care provider about whether you are at high risk of being infected with HIV. If you choose to begin PrEP, you should first be tested for HIV. You should then be tested every 3 months for as long as you are taking PrEP.  Use sunscreen. Apply sunscreen liberally and repeatedly throughout the day. You should seek shade when your shadow is shorter than you. Protect yourself by wearing long sleeves, pants, a wide-brimmed hat, and sunglasses year round whenever you are outdoors.  Tell  your health care provider of new moles or changes in moles, especially if there is a change in shape or color. Also, tell your health care provider if a mole is larger than the size of a pencil eraser.  A one-time screening for abdominal aortic aneurysm (AAA) and surgical repair of large AAAs by ultrasound is recommended for men aged 39-75 years who are current or former smokers.  Stay current with your vaccines (immunizations). Document Released: 04/23/2008 Document Revised: 10/31/2013 Document Reviewed: 03/23/2011 [facility] Patient Information 2015 [place], [place]. This information is not intended to replace advice given to you by your health care provider. Make sure you discuss any questions you have with your health care provider.

## 2015-04-05 NOTE — Assessment & Plan Note (Signed)
He is currently taking Gabapentin 300mg  and feels as though this is controlled.   Diabetic foot exam revealed slight impairment on touch sensation on medial aspect of right bottom of foot.

## 2015-04-05 NOTE — Assessment & Plan Note (Signed)
BP: 100/76 mmHg   Controlled on current regimen. He is going to stop Cardura while doing trial of  Myrbetriq

## 2015-04-05 NOTE — Progress Notes (Signed)
Pre visit review using our clinic review tool, if applicable. No additional management support is needed unless otherwise documented below in the visit note. 

## 2015-04-05 NOTE — Assessment & Plan Note (Signed)
Will trial Myrbetriq for thirty days. He was advised to stop Cardura and monitor his blood pressures. If his blood pressures are above 130/90 he is to call.   He is to follow up on how the medication is working.

## 2015-04-05 NOTE — Assessment & Plan Note (Signed)
Lab Results  Component Value Date   HGBA1C 6.1 03/20/2015   Controlled on current regimen. No changes

## 2015-04-06 ENCOUNTER — Encounter: Payer: Self-pay | Admitting: Adult Health

## 2015-05-09 DIAGNOSIS — E1142 Type 2 diabetes mellitus with diabetic polyneuropathy: Secondary | ICD-10-CM | POA: Diagnosis not present

## 2015-05-09 DIAGNOSIS — E785 Hyperlipidemia, unspecified: Secondary | ICD-10-CM | POA: Diagnosis not present

## 2015-05-09 DIAGNOSIS — I10 Essential (primary) hypertension: Secondary | ICD-10-CM | POA: Diagnosis not present

## 2015-05-09 DIAGNOSIS — I2510 Atherosclerotic heart disease of native coronary artery without angina pectoris: Secondary | ICD-10-CM | POA: Diagnosis not present

## 2015-05-09 DIAGNOSIS — E119 Type 2 diabetes mellitus without complications: Secondary | ICD-10-CM | POA: Diagnosis not present

## 2015-05-22 ENCOUNTER — Encounter: Payer: Self-pay | Admitting: Internal Medicine

## 2015-05-24 DIAGNOSIS — E785 Hyperlipidemia, unspecified: Secondary | ICD-10-CM | POA: Diagnosis not present

## 2015-05-24 DIAGNOSIS — I2510 Atherosclerotic heart disease of native coronary artery without angina pectoris: Secondary | ICD-10-CM | POA: Diagnosis not present

## 2015-05-24 DIAGNOSIS — G4733 Obstructive sleep apnea (adult) (pediatric): Secondary | ICD-10-CM | POA: Diagnosis not present

## 2015-05-24 DIAGNOSIS — I10 Essential (primary) hypertension: Secondary | ICD-10-CM | POA: Diagnosis not present

## 2015-06-17 DIAGNOSIS — E119 Type 2 diabetes mellitus without complications: Secondary | ICD-10-CM | POA: Diagnosis not present

## 2015-06-17 DIAGNOSIS — E785 Hyperlipidemia, unspecified: Secondary | ICD-10-CM | POA: Diagnosis not present

## 2015-08-18 DIAGNOSIS — Z23 Encounter for immunization: Secondary | ICD-10-CM | POA: Diagnosis not present

## 2015-09-09 ENCOUNTER — Ambulatory Visit (INDEPENDENT_AMBULATORY_CARE_PROVIDER_SITE_OTHER): Payer: Medicare Other | Admitting: Adult Health

## 2015-09-09 ENCOUNTER — Encounter: Payer: Self-pay | Admitting: Adult Health

## 2015-09-09 VITALS — BP 110/72 | Temp 98.0°F | Ht 68.0 in | Wt 207.8 lb

## 2015-09-09 DIAGNOSIS — Z760 Encounter for issue of repeat prescription: Secondary | ICD-10-CM

## 2015-09-09 DIAGNOSIS — M542 Cervicalgia: Secondary | ICD-10-CM

## 2015-09-09 DIAGNOSIS — I2510 Atherosclerotic heart disease of native coronary artery without angina pectoris: Secondary | ICD-10-CM

## 2015-09-09 MED ORDER — PREDNISONE 20 MG PO TABS: 20.0000 mg | ORAL_TABLET | Freq: Every day | ORAL | Status: DC

## 2015-09-09 MED ORDER — KETOROLAC TROMETHAMINE 60 MG/2ML IM SOLN
60.0000 mg | Freq: Once | INTRAMUSCULAR | Status: AC
  Administered 2015-09-09: 60 mg via INTRAMUSCULAR

## 2015-09-09 MED ORDER — GLIMEPIRIDE 1 MG PO TABS: 1.0000 mg | ORAL_TABLET | Freq: Two times a day (BID) | ORAL | Status: DC

## 2015-09-09 MED ORDER — AMLODIPINE BESYLATE 10 MG PO TABS: 10.0000 mg | ORAL_TABLET | Freq: Every day | ORAL | Status: DC

## 2015-09-09 MED ORDER — OLMESARTAN MEDOXOMIL 40 MG PO TABS: 40.0000 mg | ORAL_TABLET | Freq: Every day | ORAL | Status: DC

## 2015-09-09 MED ORDER — FINASTERIDE 5 MG PO TABS: 5.0000 mg | ORAL_TABLET | Freq: Every day | ORAL | Status: DC

## 2015-09-09 MED ORDER — ZOLPIDEM TARTRATE 5 MG PO TABS: ORAL_TABLET | ORAL | Status: DC

## 2015-09-09 MED ORDER — LORAZEPAM 0.5 MG PO TABS
0.5000 mg | ORAL_TABLET | Freq: Every evening | ORAL | Status: DC | PRN
Start: 2015-09-09 — End: 2015-12-04

## 2015-09-09 MED ORDER — NEBIVOLOL HCL 5 MG PO TABS: 5.0000 mg | ORAL_TABLET | Freq: Every day | ORAL | Status: DC

## 2015-09-09 MED ORDER — TIZANIDINE HCL 4 MG PO TABS: 4.0000 mg | ORAL_TABLET | Freq: Four times a day (QID) | ORAL | Status: DC | PRN

## 2015-09-09 MED ORDER — ATORVASTATIN CALCIUM 40 MG PO TABS
40.0000 mg | ORAL_TABLET | Freq: Every day | ORAL | Status: DC
Start: 2015-09-09 — End: 2016-01-13

## 2015-09-09 MED ORDER — DOXAZOSIN MESYLATE 2 MG PO TABS: 2.0000 mg | ORAL_TABLET | Freq: Every day | ORAL | Status: DC

## 2015-09-09 MED ORDER — GABAPENTIN 300 MG PO CAPS: 300.0000 mg | ORAL_CAPSULE | Freq: Two times a day (BID) | ORAL | Status: DC

## 2015-09-09 MED ORDER — TIZANIDINE HCL 4 MG PO TABS: 4.0000 mg | ORAL_TABLET | Freq: Three times a day (TID) | ORAL | Status: DC | PRN

## 2015-09-09 MED ORDER — METFORMIN HCL 500 MG PO TABS
ORAL_TABLET | ORAL | Status: DC
Start: 2015-09-09 — End: 2016-01-13

## 2015-09-09 NOTE — Patient Instructions (Addendum)
It was great seeing you again.   Your exam is consistent with a muscle strain . Continue to use heat to help keep your muscles loose.   Use the muscle relaxer up to three times a day   Take the prednisone as directed  Day 1 40 mg Day 2 40 mg Day 3 40 mg Day 4 20 mg Day 5 20 mg Day 6 20 mg   Follow up with me if there is no improvement in the next 2-3 days.   Make an appointment in December for your physical.

## 2015-09-09 NOTE — Progress Notes (Addendum)
Subjective:    Patient ID: Ian [name], male    DOB: 04/24/1941, 74 y.o.   MRN: [id number]  HPI  74 year old male who presents to the office today for right shoulder pain that started Saturday. The pain is intermittent and feels as a "electric shock." The pain radiates from neck to right ear and occasional top of his head. He denies having a headache or fevers. He does not have pain when he looks left to right. The pain is when he looks up and down.  He do endorses back pain and contributes this to moving back from [place] where he stays for the summer.   He has had one occasion of dizziness  .   Additionally he needs his medications renewed.     Review of Systems  Constitutional: Negative.   Eyes: Negative for photophobia, pain, redness and visual disturbance.  Respiratory: Negative.   Cardiovascular: Negative.   Musculoskeletal: Positive for myalgias, joint swelling, arthralgias, neck pain and neck stiffness. Negative for back pain and gait problem.  Skin: Negative.   Neurological: Positive for dizziness. Negative for weakness, light-headedness, numbness and headaches.  All other systems reviewed and are negative.  Past Medical History  Diagnosis Date  . Allergy   . Diabetes mellitus     type II  . Hyperlipidemia   . Hypertension   . Apnea, sleep 07/28/07    NPSG [place], AHI 79.9  . GERD (gastroesophageal reflux disease)   . Hypospadias   . Coronary heart disease 2008    Cath 2008, 60-70% proximal diagonal 1 stenosis,  RCA 40% stenosis.  nonischemic Lexiscan 03/2011  . Carotid artery stenosis     mild 04/2010, 05/2012    Social History   Social History  . Marital Status: Married    Spouse Name: N/A  . Number of Children: 3  . Years of Education: N/A   Occupational History  . Retired Solicitor    Social History Main Topics  . Smoking status: Never Smoker   . Smokeless tobacco: Never Used  . Alcohol Use: No  . Drug Use: No  . Sexual Activity: Not on  file   Other Topics Concern  . Not on file   Social History Narrative   Married.  Three children from first marriage.  First wife died of ovarian cancer.     Retired, did work for [employer] for 44 years.     Past Surgical History  Procedure Laterality Date  . Tonsillectomy    . Nissen fundoplication    . Nasal sinus surgery    . Vein ligation and stripping      Family History  Problem Relation Age of Onset  . Coronary artery disease Father 85    CABG    Allergies  Allergen Reactions  . Iohexol      Desc: pt. states severe reaction, dr. told patient never to have the dye again     Current Outpatient Prescriptions on File Prior to Visit  Medication Sig Dispense Refill  . amLODipine (NORVASC) 10 MG tablet Take 1 tablet (10 mg total) by mouth daily. 90 tablet 2  . aspirin 81 MG tablet Take 1 tablet (81 mg total) by mouth daily. 90 tablet 3  . Coenzyme Q10 150 MG CAPS Take 1 capsule (150 mg total) by mouth daily. 90 capsule 3  . doxazosin (CARDURA) 2 MG tablet Take 1 tablet (2 mg total) by mouth at bedtime. 90 tablet 2  . fexofenadine (  ALLEGRA) 180 MG tablet Take 1 tablet (180 mg total) by mouth daily. 90 tablet 2  . finasteride (PROSCAR) 5 MG tablet Take 1 tablet (5 mg total) by mouth daily. 90 tablet 2  . gabapentin (NEURONTIN) 300 MG capsule Take 1 capsule (300 mg total) by mouth 2 (two) times daily. 180 capsule 2  . glimepiride (AMARYL) 1 MG tablet Take 1 tablet (1 mg total) by mouth 2 (two) times daily. 180 tablet 0  . Glucosamine-Chondroitin 1500-1200 MG/30ML LIQD Twice a day 480 mL 12  . glucose blood (FREESTYLE LITE) test strip Test three times daily 50 each 3  . LORazepam (ATIVAN) 0.5 MG tablet Take 1 tablet (0.5 mg total) by mouth at bedtime as needed. 90 tablet 2  . metFORMIN (GLUCOPHAGE) 500 MG tablet Take 2 tablets with AM meal and 1 tablet with PM meal 270 tablet 2  . mirabegron ER (MYRBETRIQ) 25 MG TB24 tablet Take 1 tablet (25 mg total) by mouth daily. 30  tablet 0  . nebivolol (BYSTOLIC) 5 MG tablet Take 1 tablet (5 mg total) by mouth daily. 90 tablet 2  . olmesartan (BENICAR) 40 MG tablet Take 1 tablet (40 mg total) by mouth daily. 90 tablet 2  . Omega-3 Fatty Acids (FISH OIL) 1200 MG CAPS Take 1 capsule by mouth daily.    Marland Kitchen omeprazole (PRILOSEC) 20 MG capsule 1 capsule twice daily 180 capsule 2  . sildenafil (VIAGRA) 100 MG tablet Take 0.5-1 tablets (50-100 mg total) by mouth daily as needed for erectile dysfunction. 15 tablet 0  . triamcinolone (KENALOG) 0.025 % cream Apply 1 application topically as needed.    . zolpidem (AMBIEN) 5 MG tablet TAKE 1 TABLET BY MOUTH AT BEDTIME AS NEEDED FOR SLEEF 90 tablet 1   No current facility-administered medications on file prior to visit.    BP 110/72 mmHg  Temp(Src) 98 F (36.7 C) (Oral)  Ht 5\' 8"  (1.727 m)  Wt 207 lb 12.8 oz (94.257 kg)  BMI 31.60 kg/m2       Objective:   Physical Exam  Constitutional: He is oriented to person, place, and time. He appears well-developed and well-nourished. No distress.  Cardiovascular: Normal rate, regular rhythm, normal heart sounds and intact distal pulses.  Exam reveals no gallop and no friction rub.   No murmur heard. Pulmonary/Chest: Effort normal and breath sounds normal. No respiratory distress. He has no wheezes. He has no rales. He exhibits no tenderness.  Musculoskeletal: Normal range of motion. He exhibits tenderness. He exhibits no edema.  No Kernigs sign No Brudzinski sign He is unable to bring chin to chest No pain with palpation. No relief with palpation  Neurological: He is alert and oriented to person, place, and time.  Skin: Skin is warm and dry. No rash noted. He is not diaphoretic. No erythema. No pallor.  Psychiatric: He has a normal mood and affect. His behavior is normal. Judgment and thought content normal.  Nursing note and vitals reviewed.      Assessment & Plan:  1. Neck pain on right side - Likely MSK in nature. Not  concerned for meningitis at this time. May be occipital neuralgia.  - ketorolac (TORADOL) injection 60 mg; Inject 2 mLs (60 mg total) into the muscle once. - predniSONE (DELTASONE) 20 MG tablet; Take 1 tablet (20 mg total) by mouth daily with breakfast.  Dispense: 9 tablet; Refill: 0 - tiZANidine (ZANAFLEX) 4 MG tablet; Take 1 tablet (4 mg total) by mouth every 8 (eight)  hours as needed for muscle spasms.  Dispense: 30 tablet; Refill: 0 - Continue to use heating pad - Follow up as needed 2. Medication refill - Follow up in December for CPE - amLODipine (NORVASC) 10 MG tablet; Take 1 tablet (10 mg total) by mouth daily.  Dispense: 90 tablet; Refill: 1 - atorvastatin (LIPITOR) 40 MG tablet; Take 1 tablet (40 mg total) by mouth daily.  Dispense: 90 tablet; Refill: 3 - doxazosin (CARDURA) 2 MG tablet; Take 1 tablet (2 mg total) by mouth at bedtime.  Dispense: 90 tablet; Refill: 1 - finasteride (PROSCAR) 5 MG tablet; Take 1 tablet (5 mg total) by mouth daily.  Dispense: 90 tablet; Refill: 1 - gabapentin (NEURONTIN) 300 MG capsule; Take 1 capsule (300 mg total) by mouth 2 (two) times daily.  Dispense: 180 capsule; Refill: 1 - glimepiride (AMARYL) 1 MG tablet; Take 1 tablet (1 mg total) by mouth 2 (two) times daily.  Dispense: 180 tablet; Refill: 1 - metFORMIN (GLUCOPHAGE) 500 MG tablet; Take 2 tablets with AM meal and 1 tablet with PM meal  Dispense: 270 tablet; Refill: 1 - LORazepam (ATIVAN) 0.5 MG tablet; Take 1 tablet (0.5 mg total) by mouth at bedtime as needed.  Dispense: 90 tablet; Refill: 0 - nebivolol (BYSTOLIC) 5 MG tablet; Take 1 tablet (5 mg total) by mouth daily.  Dispense: 90 tablet; Refill: 1 - olmesartan (BENICAR) 40 MG tablet; Take 1 tablet (40 mg total) by mouth daily.  Dispense: 90 tablet; Refill: 1 - zolpidem (AMBIEN) 5 MG tablet; TAKE 1 TABLET BY MOUTH AT BEDTIME AS NEEDED FOR SLEEF  Dispense: 90 tablet; Refill: 0

## 2015-09-10 ENCOUNTER — Encounter: Payer: Self-pay | Admitting: Adult Health

## 2015-09-16 DIAGNOSIS — E119 Type 2 diabetes mellitus without complications: Secondary | ICD-10-CM | POA: Diagnosis not present

## 2015-09-16 DIAGNOSIS — H2513 Age-related nuclear cataract, bilateral: Secondary | ICD-10-CM | POA: Diagnosis not present

## 2015-09-16 DIAGNOSIS — H5213 Myopia, bilateral: Secondary | ICD-10-CM | POA: Diagnosis not present

## 2015-09-16 DIAGNOSIS — H43393 Other vitreous opacities, bilateral: Secondary | ICD-10-CM | POA: Diagnosis not present

## 2015-10-10 ENCOUNTER — Encounter: Payer: Self-pay | Admitting: Adult Health

## 2015-10-11 ENCOUNTER — Other Ambulatory Visit: Payer: Self-pay | Admitting: Adult Health

## 2015-10-13 ENCOUNTER — Encounter: Payer: Self-pay | Admitting: Adult Health

## 2015-10-14 ENCOUNTER — Other Ambulatory Visit: Payer: Self-pay | Admitting: Adult Health

## 2015-10-14 DIAGNOSIS — E119 Type 2 diabetes mellitus without complications: Secondary | ICD-10-CM

## 2015-10-14 DIAGNOSIS — Z0000 Encounter for general adult medical examination without abnormal findings: Secondary | ICD-10-CM

## 2015-10-14 DIAGNOSIS — I10 Essential (primary) hypertension: Secondary | ICD-10-CM

## 2015-10-16 ENCOUNTER — Other Ambulatory Visit (INDEPENDENT_AMBULATORY_CARE_PROVIDER_SITE_OTHER): Payer: Medicare Other

## 2015-10-16 DIAGNOSIS — Z0000 Encounter for general adult medical examination without abnormal findings: Secondary | ICD-10-CM

## 2015-10-16 DIAGNOSIS — E119 Type 2 diabetes mellitus without complications: Secondary | ICD-10-CM | POA: Diagnosis not present

## 2015-10-16 DIAGNOSIS — Z125 Encounter for screening for malignant neoplasm of prostate: Secondary | ICD-10-CM | POA: Diagnosis not present

## 2015-10-16 DIAGNOSIS — I10 Essential (primary) hypertension: Secondary | ICD-10-CM | POA: Diagnosis not present

## 2015-10-16 LAB — LIPID PANEL
Cholesterol: 164 mg/dL (ref 0–200)
HDL: 41 mg/dL (ref >39.00)
LDL Cholesterol: 101 mg/dL — ABNORMAL HIGH (ref 0–99)
NonHDL: 122.59
Total CHOL/HDL Ratio: 4
Triglycerides: 108 mg/dL (ref 0.0–149.0)
VLDL: 21.6 mg/dL (ref 0.0–40.0)

## 2015-10-16 LAB — CBC WITH DIFFERENTIAL/PLATELET
Basophils Absolute: 0.1 10*3/uL (ref 0.0–0.1)
Basophils Relative: 1 % (ref 0.0–3.0)
Eosinophils Absolute: 0.3 10*3/uL (ref 0.0–0.7)
Eosinophils Relative: 5.2 % — ABNORMAL HIGH (ref 0.0–5.0)
HCT: 42.6 % (ref 39.0–52.0)
Hemoglobin: 14 g/dL (ref 13.0–17.0)
Lymphocytes Relative: 26 % (ref 12.0–46.0)
Lymphs Abs: 1.4 10*3/uL (ref 0.7–4.0)
MCHC: 32.8 g/dL (ref 30.0–36.0)
MCV: 88.6 fl (ref 78.0–100.0)
Monocytes Absolute: 0.4 10*3/uL (ref 0.1–1.0)
Monocytes Relative: 7.2 % (ref 3.0–12.0)
Neutro Abs: 3.2 10*3/uL (ref 1.4–7.7)
Neutrophils Relative %: 60.6 % (ref 43.0–77.0)
Platelets: 190 10*3/uL (ref 150.0–400.0)
RBC: 4.8 Mil/uL (ref 4.22–5.81)
RDW: 13.8 % (ref 11.5–15.5)
WBC: 5.4 10*3/uL (ref 4.0–10.5)

## 2015-10-16 LAB — BASIC METABOLIC PANEL
BUN: 21 mg/dL (ref 6–23)
CO2: 27 mEq/L (ref 19–32)
Calcium: 9.2 mg/dL (ref 8.4–10.5)
Chloride: 108 mEq/L (ref 96–112)
Creatinine, Ser: 1.04 mg/dL (ref 0.40–1.50)
GFR: 73.72 mL/min (ref >60.00)
Glucose, Bld: 103 mg/dL — ABNORMAL HIGH (ref 70–99)
Potassium: 4.5 mEq/L (ref 3.5–5.1)
Sodium: 143 mEq/L (ref 135–145)

## 2015-10-16 LAB — HEPATIC FUNCTION PANEL
ALT: 14 U/L (ref 0–53)
AST: 18 U/L (ref 0–37)
Albumin: 3.9 g/dL (ref 3.5–5.2)
Alkaline Phosphatase: 44 U/L (ref 39–117)
Bilirubin, Direct: 0.2 mg/dL (ref 0.0–0.3)
Total Bilirubin: 1.1 mg/dL (ref 0.2–1.2)
Total Protein: 6.2 g/dL (ref 6.0–8.3)

## 2015-10-16 LAB — PSA: PSA: 0.27 ng/mL (ref 0.10–4.00)

## 2015-10-16 LAB — MICROALBUMIN / CREATININE URINE RATIO
Creatinine,U: 120.1 mg/dL
Microalb Creat Ratio: 0.6 mg/g (ref 0.0–30.0)
Microalb, Ur: <0.7 mg/dL (ref 0.0–1.9)

## 2015-10-16 LAB — HEMOGLOBIN A1C: Hgb A1c MFr Bld: 6.3 % (ref 4.6–6.5)

## 2015-10-16 LAB — TSH: TSH: 1.21 u[IU]/mL (ref 0.35–4.50)

## 2015-10-17 ENCOUNTER — Encounter: Payer: Self-pay | Admitting: Adult Health

## 2015-10-24 ENCOUNTER — Ambulatory Visit (INDEPENDENT_AMBULATORY_CARE_PROVIDER_SITE_OTHER): Payer: Medicare Other | Admitting: Adult Health

## 2015-10-24 ENCOUNTER — Other Ambulatory Visit: Payer: Self-pay | Admitting: Adult Health

## 2015-10-24 ENCOUNTER — Encounter: Payer: Self-pay | Admitting: Adult Health

## 2015-10-24 VITALS — BP 100/76 | Temp 97.5°F | Ht 66.75 in | Wt 210.3 lb

## 2015-10-24 DIAGNOSIS — I10 Essential (primary) hypertension: Secondary | ICD-10-CM | POA: Diagnosis not present

## 2015-10-24 DIAGNOSIS — J209 Acute bronchitis, unspecified: Secondary | ICD-10-CM | POA: Diagnosis not present

## 2015-10-24 DIAGNOSIS — E1142 Type 2 diabetes mellitus with diabetic polyneuropathy: Secondary | ICD-10-CM | POA: Diagnosis not present

## 2015-10-24 DIAGNOSIS — I2510 Atherosclerotic heart disease of native coronary artery without angina pectoris: Secondary | ICD-10-CM | POA: Diagnosis not present

## 2015-10-24 DIAGNOSIS — Z0000 Encounter for general adult medical examination without abnormal findings: Secondary | ICD-10-CM | POA: Diagnosis not present

## 2015-10-24 DIAGNOSIS — E785 Hyperlipidemia, unspecified: Secondary | ICD-10-CM | POA: Diagnosis not present

## 2015-10-24 MED ORDER — METHYLPREDNISOLONE 4 MG PO TBPK: ORAL_TABLET | ORAL | Status: DC

## 2015-10-24 NOTE — Progress Notes (Signed)
Subjective:  Patient presents today for their annual wellness visit.  He is a very pleasant caucasian male  has a past medical history of Allergy; Diabetes mellitus; Hyperlipidemia; Hypertension; Apnea, sleep (07/28/07); GERD (gastroesophageal reflux disease); Hypospadias; Coronary heart disease (2008); and Carotid artery stenosis.   Preventive Screening-Counseling & Management  Smoking Status: Never Smoker Second Hand Smoking status:No smokers in home  Risk Factors Regular exercise: Treadmill walking Diet: Tries to follow a healthy diet. Fall Risk: None   Cardiac risk factors:  advanced age (older than 84 for men, 82 for women)  Hyperlipidemia: diabetes. Controlled Family History: CAD with father  Depression Screen None. PHQ2  Activities of Daily Living Independent ADLs and IADLs   Hearing Difficulties: patient declines  Cognitive Testing No reported trouble.   Normal 3 word recall  List the Names of Other Physician/Practitioners you currently use: 1.has a GP in [place] 2. [facility]  - [facility]  Immunization History  Administered Date(s) Administered  . Influenza Split 08/15/2012  . Influenza Whole 08/17/2008, 07/23/2009, 08/19/2010  . Influenza, High Dose Seasonal PF 08/15/2013, 09/04/2014, 08/10/2015  . Pneumococcal Conjugate-13 10/11/2013, 05/30/2014  . Pneumococcal Polysaccharide-23 08/09/2006, 07/23/2009  . Td 07/01/2009  . Zoster 09/12/2012   Required Immunizations needed today: UTD  Screening tests- up to date There are no preventive care reminders to display for this patient.  ROS- He complains of increased neuropathy in his lower extremities R>L. Is currently on 300mg  Gabapentin. He is also complaining of a a dry cough that has lasted for 2-3 weeks. Denies any fever, sputum production, or feeling acutely il.   The following were reviewed and entered/updated in epic: Past Medical History  Diagnosis Date  . Allergy   . Diabetes  mellitus     type II  . Hyperlipidemia   . Hypertension   . Apnea, sleep 07/28/07    NPSG [place], AHI 79.9  . GERD (gastroesophageal reflux disease)   . Hypospadias   . Coronary heart disease 2008    Cath 2008, 60-70% proximal diagonal 1 stenosis,  RCA 40% stenosis.  nonischemic Lexiscan 03/2011  . Carotid artery stenosis     mild 04/2010, 05/2012   Patient Active Problem List   Diagnosis Date Noted  . Encounter to establish care 04/05/2015  . IBS (irritable bowel syndrome) 12/12/2014  . Erectile dysfunction 01/19/2014  . Diabetic polyneuropathy ([place]) 11/20/2013  . Shortness of breath 09/26/2012  . Chronic insomnia 03/25/2012  . PALPITATIONS 11/19/2010  . URINARY INCONTINENCE, PASSIVE, CONTINUOUS LEAKAGE 11/19/2010  . Disorder resulting from impaired renal function 11/13/2009  . VARICOSE VEINS, LOWER EXTREMITIES 09/17/2009  . BENIGN PROSTATIC HYPERTROPHY, WITH OBSTRUCTION 09/17/2009  . RESTLESS LEG SYNDROME 02/12/2009  . HIP PAIN, RIGHT 02/12/2009  . DIAPHRAGMATIC DISORDER 10/26/2008  . Diabetes mellitus without complication ([place]) 0000000  . Hyperlipidemia 01/08/2008  . Essential hypertension 01/08/2008  . Coronary atherosclerosis 01/08/2008  . ALLERGIC RHINITIS 01/08/2008  . SLEEP APNEA 01/08/2008   Past Surgical History  Procedure Laterality Date  . Tonsillectomy    . Nissen fundoplication    . Nasal sinus surgery    . Vein ligation and stripping      Family History  Problem Relation Age of Onset  . Coronary artery disease Father 59    CABG    Medications- reviewed and updated Current Outpatient Prescriptions  Medication Sig Dispense Refill  . amLODipine (NORVASC) 10 MG tablet Take 1 tablet (10 mg total) by mouth daily. 90 tablet 1  . aspirin 81 MG  tablet Take 1 tablet (81 mg total) by mouth daily. 90 tablet 3  . atorvastatin (LIPITOR) 40 MG tablet Take 1 tablet (40 mg total) by mouth daily. 90 tablet 3  . Coenzyme Q10 150 MG CAPS Take 1 capsule (150 mg  total) by mouth daily. 90 capsule 3  . doxazosin (CARDURA) 2 MG tablet Take 1 tablet (2 mg total) by mouth at bedtime. 90 tablet 1  . fexofenadine (ALLEGRA) 180 MG tablet Take 1 tablet (180 mg total) by mouth daily. 90 tablet 2  . finasteride (PROSCAR) 5 MG tablet Take 1 tablet (5 mg total) by mouth daily. 90 tablet 1  . gabapentin (NEURONTIN) 300 MG capsule Take 1 capsule (300 mg total) by mouth 2 (two) times daily. 180 capsule 1  . glimepiride (AMARYL) 1 MG tablet Take 1 tablet (1 mg total) by mouth 2 (two) times daily. 180 tablet 1  . Glucosamine-Chondroitin 1500-1200 MG/30ML LIQD Twice a day 480 mL 12  . glucose blood (FREESTYLE LITE) test strip Test three times daily 50 each 3  . LORazepam (ATIVAN) 0.5 MG tablet Take 1 tablet (0.5 mg total) by mouth at bedtime as needed. 90 tablet 0  . metFORMIN (GLUCOPHAGE) 500 MG tablet Take 2 tablets with AM meal and 1 tablet with PM meal 270 tablet 1  . mirabegron ER (MYRBETRIQ) 25 MG TB24 tablet Take 1 tablet (25 mg total) by mouth daily. 30 tablet 0  . nebivolol (BYSTOLIC) 5 MG tablet Take 1 tablet (5 mg total) by mouth daily. 90 tablet 1  . olmesartan (BENICAR) 40 MG tablet Take 1 tablet (40 mg total) by mouth daily. 90 tablet 1  . Omega-3 Fatty Acids (FISH OIL) 1200 MG CAPS Take 1 capsule by mouth daily.    Marland Kitchen omeprazole (PRILOSEC) 20 MG capsule 1 capsule twice daily 180 capsule 2  . predniSONE (DELTASONE) 20 MG tablet Take 1 tablet (20 mg total) by mouth daily with breakfast. 9 tablet 0  . sildenafil (VIAGRA) 100 MG tablet Take 0.5-1 tablets (50-100 mg total) by mouth daily as needed for erectile dysfunction. 15 tablet 0  . tiZANidine (ZANAFLEX) 4 MG tablet Take 1 tablet (4 mg total) by mouth every 8 (eight) hours as needed for muscle spasms. 30 tablet 0  . triamcinolone (KENALOG) 0.025 % cream Apply 1 application topically as needed.    . zolpidem (AMBIEN) 5 MG tablet TAKE 1 TABLET BY MOUTH AT BEDTIME AS NEEDED FOR SLEEF 90 tablet 0   No current  facility-administered medications for this visit.    Allergies-reviewed and updated Allergies  Allergen Reactions  . Iohexol      Desc: pt. states severe reaction, dr. told patient never to have the dye again     Social History   Social History  . Marital Status: Married    Spouse Name: N/A  . Number of Children: 3  . Years of Education: N/A   Occupational History  . Retired Solicitor    Social History Main Topics  . Smoking status: Never Smoker   . Smokeless tobacco: Never Used  . Alcohol Use: No  . Drug Use: No  . Sexual Activity: Not Asked   Other Topics Concern  . None   Social History Narrative   Married.  Three children from first marriage.  First wife died of ovarian cancer.     Retired, did work for [employer] for 44 years.     Objective: BP 100/76 mmHg  Temp(Src) 97.5 F (36.4 C) (  Oral)  Ht 5' 6.75" (1.695 m)  Wt 210 lb 4.8 oz (95.391 kg)  BMI 33.20 kg/m2   GENERAL: vitals reviewed and listed above, alert, oriented, appears well hydrated and in no acute distress. He is slightly obese around the abdomen.   HEENT: atraumatic, conjunttiva clear, no obvious abnormalities on inspection of external nose and ears. No cerumen impaction in bilateral ears. Is wearing glasses.   NECK: Neck is soft and supple without masses, no adenopathy or thyromegaly, trachea midline, Normal range of motion.   LUNGS: clear to auscultation bilaterally, no wheezes, rales or rhonchi, good air movement. Increased expiratory phase  CV: Regular rate and rhythm, normal S1/S2, no audible murmurs, gallops, or rubs. No carotid bruit and no peripheral edema.   MS: moves all extremities without noticeable abnormality. No edema noted  Abd: soft/nontender/nondistended/normal bowel sounds   Rectum: external hemorrhoids, prostate smooth and symmetrical. Guaiac negative    Skin: warm and dry, no rash   Extremities: No clubbing, cyanosis, or edema. Capillary refill is WNL.  Pulses intact bilaterally in upper and lower extremities.   Neuro: CN II-XII intact, sensation and reflexes normal throughout, 5/5 muscle strength in bilateral upper and lower extremities. Normal finger to nose. Normal rapid alternating movements. Normal romberg. No pronator drift.   PSYCH: pleasant and cooperative, no obvious depression or anxiety  Assessment/Plan:  1. Acute bronchitis, unspecified organism - methylPREDNISolone (MEDROL DOSEPAK) 4 MG TBPK tablet; Take as directed  Dispense: 21 tablet; Refill: 0  2. Essential hypertension - controlled on current medication - EKG 12-Lead - Marked sinus  Bradycardia  - occasional PAC    # PACs = 1. -Right bundle branch block., rate 46. Comparable to prior EKG  3. Medicare annual wellness visit, subsequent - Follow up in one year for next MWE - Follow up sooner if needed - Reviewed labs with patient.  - Continue to work on diet and exercise.   4. Hyperlipidemia - Controlled on current medication   5. Diabetic polyneuropathy associated with type 2 diabetes mellitus (HCC) - Increase gabapentin to 600 mg and let me know if there is any improvement.    Return precautions advised.    Dorothyann Peng, AGNP

## 2015-10-24 NOTE — Patient Instructions (Signed)
As always, it is great seeing you!  I have sent a prescription for prednisone, take as directed.   You can take 600mg  Gabapentin to see if that helps with the neuropathy.   Follow up with me in 6 months for a follow up. If you need anything in the meantime, please let me know.   Have a very merry christmas and safe travels!

## 2015-10-24 NOTE — Progress Notes (Signed)
Pre visit review using our clinic review tool, if applicable. No additional management support is needed unless otherwise documented below in the visit note. 

## 2015-12-04 ENCOUNTER — Other Ambulatory Visit: Payer: Self-pay | Admitting: Family Medicine

## 2015-12-04 DIAGNOSIS — Z760 Encounter for issue of repeat prescription: Secondary | ICD-10-CM

## 2015-12-04 NOTE — Telephone Encounter (Signed)
Ok to refill for one month  

## 2015-12-05 MED ORDER — ZOLPIDEM TARTRATE 5 MG PO TABS: ORAL_TABLET | ORAL | Status: DC

## 2015-12-05 MED ORDER — LORAZEPAM 0.5 MG PO TABS: 0.5000 mg | ORAL_TABLET | Freq: Every evening | ORAL | Status: DC | PRN

## 2015-12-23 ENCOUNTER — Encounter: Payer: Self-pay | Admitting: Adult Health

## 2015-12-26 ENCOUNTER — Other Ambulatory Visit: Payer: Self-pay | Admitting: Adult Health

## 2015-12-26 MED ORDER — OLOPATADINE HCL 0.6 % NA SOLN: NASAL | Status: DC

## 2016-01-12 ENCOUNTER — Encounter: Payer: Self-pay | Admitting: Adult Health

## 2016-01-13 ENCOUNTER — Other Ambulatory Visit: Payer: Self-pay | Admitting: Adult Health

## 2016-01-13 DIAGNOSIS — Z760 Encounter for issue of repeat prescription: Secondary | ICD-10-CM

## 2016-01-13 MED ORDER — AMLODIPINE BESYLATE 10 MG PO TABS
10.0000 mg | ORAL_TABLET | Freq: Every day | ORAL | Status: DC
Start: 2016-01-13 — End: 2016-04-09

## 2016-01-13 MED ORDER — METFORMIN HCL 500 MG PO TABS: ORAL_TABLET | ORAL | Status: DC

## 2016-01-13 MED ORDER — NEBIVOLOL HCL 5 MG PO TABS
5.0000 mg | ORAL_TABLET | Freq: Every day | ORAL | Status: DC
Start: 2016-01-13 — End: 2016-04-28

## 2016-01-13 MED ORDER — ATORVASTATIN CALCIUM 40 MG PO TABS: 40.0000 mg | ORAL_TABLET | Freq: Every day | ORAL | Status: DC

## 2016-01-13 MED ORDER — DOXAZOSIN MESYLATE 2 MG PO TABS: 2.0000 mg | ORAL_TABLET | Freq: Every day | ORAL | Status: DC

## 2016-01-13 MED ORDER — OLMESARTAN MEDOXOMIL 40 MG PO TABS: 40.0000 mg | ORAL_TABLET | Freq: Every day | ORAL | Status: DC

## 2016-02-19 ENCOUNTER — Encounter: Payer: Self-pay | Admitting: Adult Health

## 2016-02-19 ENCOUNTER — Other Ambulatory Visit: Payer: Self-pay

## 2016-02-19 MED ORDER — OLOPATADINE HCL 0.6 % NA SOLN: NASAL | Status: DC

## 2016-02-25 ENCOUNTER — Other Ambulatory Visit: Payer: Self-pay

## 2016-02-25 DIAGNOSIS — Z760 Encounter for issue of repeat prescription: Secondary | ICD-10-CM

## 2016-02-25 MED ORDER — LORAZEPAM 0.5 MG PO TABS: 0.5000 mg | ORAL_TABLET | Freq: Every evening | ORAL | Status: DC | PRN

## 2016-02-25 NOTE — Telephone Encounter (Signed)
OK to refill

## 2016-02-25 NOTE — Telephone Encounter (Signed)
Ok to refill 

## 2016-02-26 ENCOUNTER — Encounter: Payer: Self-pay | Admitting: Adult Health

## 2016-02-27 DIAGNOSIS — L820 Inflamed seborrheic keratosis: Secondary | ICD-10-CM | POA: Diagnosis not present

## 2016-02-27 DIAGNOSIS — L603 Nail dystrophy: Secondary | ICD-10-CM | POA: Diagnosis not present

## 2016-02-28 ENCOUNTER — Ambulatory Visit (INDEPENDENT_AMBULATORY_CARE_PROVIDER_SITE_OTHER): Payer: Medicare Other | Admitting: Adult Health

## 2016-02-28 ENCOUNTER — Encounter: Payer: Self-pay | Admitting: Adult Health

## 2016-02-28 DIAGNOSIS — M1990 Unspecified osteoarthritis, unspecified site: Secondary | ICD-10-CM | POA: Diagnosis not present

## 2016-02-28 DIAGNOSIS — R32 Unspecified urinary incontinence: Secondary | ICD-10-CM | POA: Diagnosis not present

## 2016-02-28 MED ORDER — MIRABEGRON ER 50 MG PO TB24: 50.0000 mg | ORAL_TABLET | Freq: Every day | ORAL | Status: DC

## 2016-02-28 MED ORDER — METHYLPREDNISOLONE ACETATE 80 MG/ML IJ SUSP
120.0000 mg | Freq: Once | INTRAMUSCULAR | Status: AC
  Administered 2016-02-28: 120 mg via INTRAMUSCULAR

## 2016-02-28 NOTE — Progress Notes (Signed)
Subjective:    Patient ID: Ian [name], male    DOB: 10-Nov-1940, 75 y.o.   MRN: [id number]  HPI  75 year old male who presents to the office with two separate complaints.   1) He has had right shoulder pain, right hip pain and right knee pain for " a long time", but that the pain has been getting worse over the last few months. The pain is especially bad in the evening when he is going to bed and during the morning when he just wakes up. The pain goes away during the day. Pain is worse in the shoulder and sometimes wakes him up at night. Denies any numbness or tingling in extremities  2) Urinary Incontinence - This has been an on going issue with the patient. He has trouble with dribbling before and after using the restroom. This has caused skin break down on his thighs. He is also worried/embarressed about the wetness on his pants.   He does take proscar and Cardura for BPH and urinary retention.     Review of Systems  Constitutional: Negative.   Respiratory: Negative.   Cardiovascular: Negative.   Genitourinary:       Incontinence  Musculoskeletal: Positive for arthralgias. Negative for myalgias, joint swelling and gait problem.  Skin: Negative.   All other systems reviewed and are negative.  Past Medical History  Diagnosis Date  . Allergy   . Diabetes mellitus     type II  . Hyperlipidemia   . Hypertension   . Apnea, sleep 07/28/07    NPSG [place], AHI 79.9  . GERD (gastroesophageal reflux disease)   . Hypospadias   . Coronary heart disease 2008    Cath 2008, 60-70% proximal diagonal 1 stenosis,  RCA 40% stenosis.  nonischemic Lexiscan 03/2011  . Carotid artery stenosis     mild 04/2010, 05/2012    Social History   Social History  . Marital Status: Married    Spouse Name: N/A  . Number of Children: 3  . Years of Education: N/A   Occupational History  . Retired Solicitor    Social History Main Topics  . Smoking status: Never Smoker   . Smokeless  tobacco: Never Used  . Alcohol Use: No  . Drug Use: No  . Sexual Activity: Not on file   Other Topics Concern  . Not on file   Social History Narrative   Married.  Three children from first marriage.  First wife died of ovarian cancer.     Retired, did work for [employer] for 44 years.     Past Surgical History  Procedure Laterality Date  . Tonsillectomy    . Nissen fundoplication    . Nasal sinus surgery    . Vein ligation and stripping      Family History  Problem Relation Age of Onset  . Coronary artery disease Father 24    CABG  . Dementia Mother     Allergies  Allergen Reactions  . Iohexol      Desc: pt. states severe reaction, dr. told patient never to have the dye again     Current Outpatient Prescriptions on File Prior to Visit  Medication Sig Dispense Refill  . amLODipine (NORVASC) 10 MG tablet Take 1 tablet (10 mg total) by mouth daily. 30 tablet 0  . aspirin 81 MG tablet Take 1 tablet (81 mg total) by mouth daily. 90 tablet 3  . atorvastatin (LIPITOR) 40 MG tablet Take 1  tablet (40 mg total) by mouth daily. 30 tablet 0  . Coenzyme Q10 150 MG CAPS Take 1 capsule (150 mg total) by mouth daily. 90 capsule 3  . doxazosin (CARDURA) 2 MG tablet Take 1 tablet (2 mg total) by mouth at bedtime. 30 tablet 0  . fexofenadine (ALLEGRA) 180 MG tablet Take 1 tablet (180 mg total) by mouth daily. 90 tablet 2  . finasteride (PROSCAR) 5 MG tablet Take 1 tablet (5 mg total) by mouth daily. 90 tablet 1  . gabapentin (NEURONTIN) 300 MG capsule Take 1 capsule (300 mg total) by mouth 2 (two) times daily. 180 capsule 1  . glimepiride (AMARYL) 1 MG tablet Take 1 tablet (1 mg total) by mouth 2 (two) times daily. 180 tablet 1  . Glucosamine-Chondroitin 1500-1200 MG/30ML LIQD Twice a day 480 mL 12  . glucose blood (FREESTYLE LITE) test strip Test three times daily 50 each 3  . LORazepam (ATIVAN) 0.5 MG tablet Take 1 tablet (0.5 mg total) by mouth at bedtime as needed. 90 tablet 0    . metFORMIN (GLUCOPHAGE) 500 MG tablet Take 2 tablets with AM meal and 1 tablet with PM meal 90 tablet 0  . nebivolol (BYSTOLIC) 5 MG tablet Take 1 tablet (5 mg total) by mouth daily. 30 tablet 0  . olmesartan (BENICAR) 40 MG tablet Take 1 tablet (40 mg total) by mouth daily. 30 tablet 0  . Olopatadine HCl 0.6 % SOLN Two sprays into each nostril twice per day 30.5 g 6  . Omega-3 Fatty Acids (FISH OIL) 1200 MG CAPS Take 1 capsule by mouth daily.    Marland Kitchen omeprazole (PRILOSEC) 20 MG capsule 1 capsule twice daily 180 capsule 2  . predniSONE (DELTASONE) 20 MG tablet Take 1 tablet (20 mg total) by mouth daily with breakfast. 9 tablet 0  . sildenafil (VIAGRA) 100 MG tablet Take 0.5-1 tablets (50-100 mg total) by mouth daily as needed for erectile dysfunction. 15 tablet 0  . tiZANidine (ZANAFLEX) 4 MG tablet Take 1 tablet (4 mg total) by mouth every 8 (eight) hours as needed for muscle spasms. 30 tablet 0  . triamcinolone (KENALOG) 0.025 % cream Apply 1 application topically as needed.    . zolpidem (AMBIEN) 5 MG tablet TAKE 1 TABLET BY MOUTH AT BEDTIME AS NEEDED FOR SLEEF 90 tablet 0   No current facility-administered medications on file prior to visit.    BP 122/60 mmHg  Temp(Src) 98.3 F (36.8 C) (Oral)  Wt 214 lb 3.2 oz (97.16 kg)       Objective:   Physical Exam  Constitutional: He is oriented to person, place, and time. He appears well-developed and well-nourished. No distress.  Cardiovascular: Normal rate, regular rhythm, normal heart sounds and intact distal pulses.  Exam reveals no gallop and no friction rub.   No murmur heard. Pulmonary/Chest: Effort normal and breath sounds normal. No respiratory distress. He has no wheezes. He has no rales. He exhibits no tenderness.  Musculoskeletal: Normal range of motion. He exhibits no edema or tenderness.  Has normal range of motion in all extremities. No pain with palpation  Neurological: He is alert and oriented to person, place, and time.   Skin: Skin is warm and dry. No rash noted. He is not diaphoretic. No erythema. No pallor.  Psychiatric: He has a normal mood and affect. His behavior is normal. Judgment and thought content normal.  Nursing note and vitals reviewed.     Assessment & Plan:  1. Urinary incontinence,  unspecified incontinence type - mirabegron ER (MYRBETRIQ) 50 MG TB24 tablet; Take 1 tablet (50 mg total) by mouth daily.  Dispense: 90 tablet; Refill: 3 - Follow up in three weeks - D/C Proscar  2. Arthritis - Tylenol Arthritis for pain - He does not want to take Nsaids - methylPREDNISolone acetate (DEPO-MEDROL) injection 120 mg; Inject 1.5 mLs (120 mg total) into the muscle once.  Consent obtained and verified. Sterile betadine prep. Furthur cleansed with alcohol. Topical analgesic spray: Ethyl chloride. Joint: Right Shoulder Approached in typical fashion with Anterolateral approach Completed without difficulty Meds: 120 MG Depo Medrol & 2% Lidocaine  Needle:22 GF 1.5 in Aftercare instructions and Red flags advised.  - Consider sending to sports medicine for hip injection.   Dorothyann Peng, NP

## 2016-03-03 ENCOUNTER — Encounter: Payer: Self-pay | Admitting: Adult Health

## 2016-03-24 ENCOUNTER — Encounter: Payer: Self-pay | Admitting: Adult Health

## 2016-03-24 ENCOUNTER — Other Ambulatory Visit: Payer: Self-pay | Admitting: Adult Health

## 2016-03-24 DIAGNOSIS — E119 Type 2 diabetes mellitus without complications: Secondary | ICD-10-CM

## 2016-03-27 ENCOUNTER — Other Ambulatory Visit (INDEPENDENT_AMBULATORY_CARE_PROVIDER_SITE_OTHER): Payer: Medicare Other

## 2016-03-27 DIAGNOSIS — E119 Type 2 diabetes mellitus without complications: Secondary | ICD-10-CM | POA: Diagnosis not present

## 2016-03-27 DIAGNOSIS — I10 Essential (primary) hypertension: Secondary | ICD-10-CM

## 2016-03-27 DIAGNOSIS — Z0000 Encounter for general adult medical examination without abnormal findings: Secondary | ICD-10-CM

## 2016-03-27 LAB — BASIC METABOLIC PANEL WITH GFR
BUN: 20 mg/dL (ref 6–23)
CO2: 25 meq/L (ref 19–32)
Calcium: 9.1 mg/dL (ref 8.4–10.5)
Chloride: 105 meq/L (ref 96–112)
Creatinine, Ser: 1.02 mg/dL (ref 0.40–1.50)
GFR: 75.31 mL/min
Glucose, Bld: 121 mg/dL — ABNORMAL HIGH (ref 70–99)
Potassium: 4.4 meq/L (ref 3.5–5.1)
Sodium: 139 meq/L (ref 135–145)

## 2016-03-27 LAB — POCT URINALYSIS DIPSTICK
Bilirubin, UA: NEGATIVE
Blood, UA: NEGATIVE
Glucose, UA: NEGATIVE
Ketones, UA: NEGATIVE
Leukocytes, UA: NEGATIVE
Nitrite, UA: NEGATIVE
Protein, UA: NEGATIVE
Spec Grav, UA: 1.02
Urobilinogen, UA: 0.2
pH, UA: 5

## 2016-03-27 LAB — HEMOGLOBIN A1C: Hgb A1c MFr Bld: 6.5 % (ref 4.6–6.5)

## 2016-03-31 ENCOUNTER — Ambulatory Visit: Payer: Medicare Other | Admitting: Adult Health

## 2016-04-08 ENCOUNTER — Encounter: Payer: Self-pay | Admitting: Adult Health

## 2016-04-08 ENCOUNTER — Ambulatory Visit (INDEPENDENT_AMBULATORY_CARE_PROVIDER_SITE_OTHER): Payer: Medicare Other | Admitting: Adult Health

## 2016-04-08 VITALS — BP 112/52 | Wt 207.4 lb

## 2016-04-08 DIAGNOSIS — R42 Dizziness and giddiness: Secondary | ICD-10-CM

## 2016-04-08 DIAGNOSIS — Z760 Encounter for issue of repeat prescription: Secondary | ICD-10-CM

## 2016-04-08 DIAGNOSIS — R252 Cramp and spasm: Secondary | ICD-10-CM

## 2016-04-08 MED ORDER — ZOLPIDEM TARTRATE 5 MG PO TABS: ORAL_TABLET | ORAL | Status: DC

## 2016-04-08 MED ORDER — LORAZEPAM 0.5 MG PO TABS: 0.5000 mg | ORAL_TABLET | Freq: Every evening | ORAL | Status: DC | PRN

## 2016-04-08 NOTE — Progress Notes (Signed)
Subjective:    Patient ID: Ian [name], male    DOB: [birth year], 75 y.o.   MRN: [id number]  HPI   75 year old male who presents to the office today to discuss multiple acute complaints prior to going to [place] for the summer.   1) He reports that recently that he has been experiencing severe leg cramps in the evening that often wake him from a sound sleep. . The leg cramps are not happening every night. He has been drinking selter water which help slightly with the cramping. He is unsure of what is causing his leg cramps. He has not started any new medication and has been taking his statin for multiple years. He does report that he does not feel like he is drinking enough water throughout the day   2) He has been having episodes of intermittent lightheadedness with position change. These episodes last a few seconds and then resolve. He denies any syncope or near syncopal episodes. This has been happening for the last fee weeks.   3) He needs medication refills on Ambian and Ativan   BP Readings from Last 3 Encounters:  04/08/16 112/52  02/28/16 122/60  10/24/15 100/76     Review of Systems  Constitutional: Negative.   HENT: Negative.   Respiratory: Negative.   Cardiovascular: Negative.   Gastrointestinal: Negative.   Musculoskeletal: Positive for myalgias and arthralgias. Negative for joint swelling and gait problem.  Neurological: Positive for light-headedness. Negative for dizziness, weakness, numbness and headaches.  Hematological: Negative.   All other systems reviewed and are negative.  Past Medical History  Diagnosis Date  . Allergy   . Diabetes mellitus     type II  . Hyperlipidemia   . Hypertension   . Apnea, sleep 07/28/07    NPSG [place], AHI 79.9  . GERD (gastroesophageal reflux disease)   . Hypospadias   . Coronary heart disease 2008    Cath 2008, 60-70% proximal diagonal 1 stenosis,  RCA 40% stenosis.  nonischemic Lexiscan 03/2011  . Carotid  artery stenosis     mild 04/2010, 05/2012    Social History   Social History  . Marital Status: Married    Spouse Name: N/A  . Number of Children: 3  . Years of Education: N/A   Occupational History  . Retired Solicitor    Social History Main Topics  . Smoking status: Never Smoker   . Smokeless tobacco: Never Used  . Alcohol Use: No  . Drug Use: No  . Sexual Activity: Not on file   Other Topics Concern  . Not on file   Social History Narrative   Married.  Three children from first marriage.  First wife died of ovarian cancer.     Retired, did work for [employer] for 44 years.     Past Surgical History  Procedure Laterality Date  . Tonsillectomy    . Nissen fundoplication    . Nasal sinus surgery    . Vein ligation and stripping      Family History  Problem Relation Age of Onset  . Coronary artery disease Father 34    CABG  . Dementia Mother     Allergies  Allergen Reactions  . Iohexol      Desc: pt. states severe reaction, dr. told patient never to have the dye again     Current Outpatient Prescriptions on File Prior to Visit  Medication Sig Dispense Refill  . amLODipine (NORVASC) 10 MG  tablet Take 1 tablet (10 mg total) by mouth daily. 30 tablet 0  . aspirin 81 MG tablet Take 1 tablet (81 mg total) by mouth daily. 90 tablet 3  . atorvastatin (LIPITOR) 40 MG tablet Take 1 tablet (40 mg total) by mouth daily. 30 tablet 0  . Coenzyme Q10 150 MG CAPS Take 1 capsule (150 mg total) by mouth daily. 90 capsule 3  . doxazosin (CARDURA) 2 MG tablet Take 1 tablet (2 mg total) by mouth at bedtime. 30 tablet 0  . fexofenadine (ALLEGRA) 180 MG tablet Take 1 tablet (180 mg total) by mouth daily. 90 tablet 2  . gabapentin (NEURONTIN) 300 MG capsule Take 1 capsule (300 mg total) by mouth 2 (two) times daily. 180 capsule 1  . glimepiride (AMARYL) 1 MG tablet Take 1 tablet (1 mg total) by mouth 2 (two) times daily. 180 tablet 1  . Glucosamine-Chondroitin 1500-1200  MG/30ML LIQD Twice a day 480 mL 12  . glucose blood (FREESTYLE LITE) test strip Test three times daily 50 each 3  . metFORMIN (GLUCOPHAGE) 500 MG tablet Take 2 tablets with AM meal and 1 tablet with PM meal 90 tablet 0  . mirabegron ER (MYRBETRIQ) 50 MG TB24 tablet Take 1 tablet (50 mg total) by mouth daily. 90 tablet 3  . nebivolol (BYSTOLIC) 5 MG tablet Take 1 tablet (5 mg total) by mouth daily. 30 tablet 0  . olmesartan (BENICAR) 40 MG tablet Take 1 tablet (40 mg total) by mouth daily. 30 tablet 0  . Olopatadine HCl 0.6 % SOLN Two sprays into each nostril twice per day 30.5 g 6  . Omega-3 Fatty Acids (FISH OIL) 1200 MG CAPS Take 1 capsule by mouth daily.    Marland Kitchen omeprazole (PRILOSEC) 20 MG capsule 1 capsule twice daily 180 capsule 2  . triamcinolone (KENALOG) 0.025 % cream Apply 1 application topically as needed.     No current facility-administered medications on file prior to visit.    BP 112/52 mmHg  Wt 207 lb 6.4 oz (94.076 kg)       Objective:   Physical Exam  Constitutional: He is oriented to person, place, and time. He appears well-developed and well-nourished. No distress.  HENT:  Head: Normocephalic and atraumatic.  Right Ear: External ear normal.  Left Ear: External ear normal.  Nose: Nose normal.  Mouth/Throat: Oropharynx is clear and moist. No oropharyngeal exudate.  Eyes: Conjunctivae and EOM are normal. Pupils are equal, round, and reactive to light. Right eye exhibits no discharge. Left eye exhibits no discharge.  Cardiovascular: Normal rate, regular rhythm, normal heart sounds and intact distal pulses.  Exam reveals no gallop and no friction rub.   No murmur heard. Pulmonary/Chest: Effort normal and breath sounds normal. No respiratory distress. He has no wheezes. He has no rales. He exhibits no tenderness.  Musculoskeletal: Normal range of motion. He exhibits no edema or tenderness.  Neurological: He is alert and oriented to person, place, and time.  Skin: Skin is  warm and dry. No rash noted. He is not diaphoretic. No erythema. No pallor.  Psychiatric: He has a normal mood and affect. His behavior is normal. Judgment and thought content normal.  Nursing note and vitals reviewed.      Assessment & Plan:  1. Cramp of both lower extremities - Potassium level is WNL - Possibly from slight dehydration - advised to increase fluid - Can take statin every other day  - Stretch before bedtime  - Consider baclofen  -  Follow up as needed 2. Lightheadedness - Appears to be postural in nature. Possibly hypotension.  - Increase fluid - Can decrease amlodipine to 5 mg  - Monitor BP at home  BP Readings from Last 3 Encounters:  04/08/16 112/52  02/28/16 122/60  10/24/15 100/76  -follow up as needed   3. Medication refill  - zolpidem (AMBIEN) 5 MG tablet; TAKE 1 TABLET BY MOUTH AT BEDTIME AS NEEDED FOR SLEEF  Dispense: 90 tablet; Refill: 0 - LORazepam (ATIVAN) 0.5 MG tablet; Take 1 tablet (0.5 mg total) by mouth at bedtime as needed.  Dispense: 90 tablet; Refill: 0  Dorothyann Peng, NP

## 2016-04-08 NOTE — Patient Instructions (Signed)
It was great seeing you again.   Take 5 mg of Amlodipine instead of 10 mg and increase your water intake.   You can start taking Lipitor every other day  Send me a note before you go on your trip about the muscle cramps  Enjoy your summer and let me know if you need anything.

## 2016-04-09 ENCOUNTER — Other Ambulatory Visit: Payer: Self-pay | Admitting: Adult Health

## 2016-04-09 ENCOUNTER — Encounter: Payer: Self-pay | Admitting: Adult Health

## 2016-04-09 DIAGNOSIS — Z760 Encounter for issue of repeat prescription: Secondary | ICD-10-CM

## 2016-04-09 MED ORDER — AMLODIPINE BESYLATE 5 MG PO TABS: 5.0000 mg | ORAL_TABLET | Freq: Every day | ORAL | Status: DC

## 2016-04-25 ENCOUNTER — Encounter: Payer: Self-pay | Admitting: Adult Health

## 2016-04-28 ENCOUNTER — Other Ambulatory Visit: Payer: Self-pay | Admitting: Adult Health

## 2016-04-28 DIAGNOSIS — R32 Unspecified urinary incontinence: Secondary | ICD-10-CM

## 2016-04-28 DIAGNOSIS — Z760 Encounter for issue of repeat prescription: Secondary | ICD-10-CM

## 2016-04-28 MED ORDER — NEBIVOLOL HCL 5 MG PO TABS: 5.0000 mg | ORAL_TABLET | Freq: Every day | ORAL | Status: DC

## 2016-04-28 MED ORDER — METFORMIN HCL 500 MG PO TABS: ORAL_TABLET | ORAL | Status: DC

## 2016-04-28 MED ORDER — GLIMEPIRIDE 1 MG PO TABS: 1.0000 mg | ORAL_TABLET | Freq: Two times a day (BID) | ORAL | Status: DC

## 2016-04-28 MED ORDER — OLMESARTAN MEDOXOMIL 40 MG PO TABS: 40.0000 mg | ORAL_TABLET | Freq: Every day | ORAL | Status: DC

## 2016-04-28 MED ORDER — FISH OIL 1200 MG PO CAPS: 1.0000 | ORAL_CAPSULE | Freq: Every day | ORAL | Status: DC

## 2016-04-28 MED ORDER — AMLODIPINE BESYLATE 5 MG PO TABS: 5.0000 mg | ORAL_TABLET | Freq: Every day | ORAL | Status: DC

## 2016-04-28 MED ORDER — MIRABEGRON ER 50 MG PO TB24: 50.0000 mg | ORAL_TABLET | Freq: Every day | ORAL | Status: DC

## 2016-04-28 MED ORDER — ATORVASTATIN CALCIUM 40 MG PO TABS: 40.0000 mg | ORAL_TABLET | Freq: Every day | ORAL | Status: DC

## 2016-04-28 MED ORDER — GLUCOSE BLOOD VI STRP: ORAL_STRIP | Status: DC

## 2016-04-28 MED ORDER — OLOPATADINE HCL 0.6 % NA SOLN: NASAL | Status: DC

## 2016-04-28 MED ORDER — DOXAZOSIN MESYLATE 2 MG PO TABS: 2.0000 mg | ORAL_TABLET | Freq: Every day | ORAL | Status: DC

## 2016-04-28 MED ORDER — OMEPRAZOLE 20 MG PO CPDR: DELAYED_RELEASE_CAPSULE | ORAL | Status: DC

## 2016-05-01 ENCOUNTER — Telehealth: Payer: Self-pay | Admitting: Adult Health

## 2016-05-01 NOTE — Telephone Encounter (Signed)
[place] w/[facility] wanted the provider to know they do not cover Free Style Lite Strips 50's, but they do cover:  1)  the One Touch Ultra Strip Blue 50's and 100's  2)  One Touch Verio Strip 50's and 100's  Also if the doctor can use one of the alternative strips, the patient will also receive a Blood Glucose Monitor as no additional charge.  Please call and use reference number [id number].  They would like to have this resolved by Monday at the end of the work day, or they will close the case.

## 2016-05-05 NOTE — Telephone Encounter (Signed)
Please advise 

## 2016-05-05 NOTE — Telephone Encounter (Signed)
One Touch Ultra called in to [facility] - left message for patient to return phone call.

## 2016-05-05 NOTE — Telephone Encounter (Signed)
Please update patient

## 2016-05-05 NOTE — Telephone Encounter (Signed)
Ok to use [employer] manager

## 2016-05-07 NOTE — Telephone Encounter (Signed)
I left a voice message with below information. 

## 2016-05-25 DIAGNOSIS — E1142 Type 2 diabetes mellitus with diabetic polyneuropathy: Secondary | ICD-10-CM | POA: Diagnosis not present

## 2016-05-25 DIAGNOSIS — I10 Essential (primary) hypertension: Secondary | ICD-10-CM | POA: Diagnosis not present

## 2016-05-26 DIAGNOSIS — Z6832 Body mass index (BMI) 32.0-32.9, adult: Secondary | ICD-10-CM | POA: Diagnosis not present

## 2016-05-26 DIAGNOSIS — Z1389 Encounter for screening for other disorder: Secondary | ICD-10-CM | POA: Diagnosis not present

## 2016-05-26 DIAGNOSIS — E1142 Type 2 diabetes mellitus with diabetic polyneuropathy: Secondary | ICD-10-CM | POA: Diagnosis not present

## 2016-05-26 DIAGNOSIS — Z0000 Encounter for general adult medical examination without abnormal findings: Secondary | ICD-10-CM | POA: Diagnosis not present

## 2016-05-26 DIAGNOSIS — Z713 Dietary counseling and surveillance: Secondary | ICD-10-CM | POA: Diagnosis not present

## 2016-05-27 DIAGNOSIS — E782 Mixed hyperlipidemia: Secondary | ICD-10-CM | POA: Diagnosis not present

## 2016-05-27 DIAGNOSIS — G4733 Obstructive sleep apnea (adult) (pediatric): Secondary | ICD-10-CM | POA: Diagnosis not present

## 2016-05-27 DIAGNOSIS — I10 Essential (primary) hypertension: Secondary | ICD-10-CM | POA: Diagnosis not present

## 2016-05-27 DIAGNOSIS — I739 Peripheral vascular disease, unspecified: Secondary | ICD-10-CM | POA: Diagnosis not present

## 2016-05-27 DIAGNOSIS — I2510 Atherosclerotic heart disease of native coronary artery without angina pectoris: Secondary | ICD-10-CM | POA: Diagnosis not present

## 2016-06-11 DIAGNOSIS — E119 Type 2 diabetes mellitus without complications: Secondary | ICD-10-CM | POA: Diagnosis not present

## 2016-06-11 DIAGNOSIS — I10 Essential (primary) hypertension: Secondary | ICD-10-CM | POA: Diagnosis not present

## 2016-06-11 DIAGNOSIS — Z6832 Body mass index (BMI) 32.0-32.9, adult: Secondary | ICD-10-CM | POA: Diagnosis not present

## 2016-06-11 DIAGNOSIS — E785 Hyperlipidemia, unspecified: Secondary | ICD-10-CM | POA: Diagnosis not present

## 2016-06-15 DIAGNOSIS — I10 Essential (primary) hypertension: Secondary | ICD-10-CM | POA: Diagnosis not present

## 2016-06-15 DIAGNOSIS — K4090 Unilateral inguinal hernia, without obstruction or gangrene, not specified as recurrent: Secondary | ICD-10-CM | POA: Diagnosis not present

## 2016-07-07 ENCOUNTER — Other Ambulatory Visit: Payer: Self-pay | Admitting: Family Medicine

## 2016-07-07 DIAGNOSIS — Z760 Encounter for issue of repeat prescription: Secondary | ICD-10-CM

## 2016-07-07 NOTE — Telephone Encounter (Signed)
Ok to refill 

## 2016-07-08 MED ORDER — LORAZEPAM 0.5 MG PO TABS: 0.5000 mg | ORAL_TABLET | Freq: Every evening | ORAL | 0 refills | Status: DC | PRN

## 2016-07-20 ENCOUNTER — Encounter: Payer: Self-pay | Admitting: Adult Health

## 2016-07-22 ENCOUNTER — Other Ambulatory Visit: Payer: Self-pay

## 2016-07-24 MED ORDER — ONETOUCH DELICA LANCETS 33G MISC: 3 refills | Status: DC

## 2016-07-26 ENCOUNTER — Other Ambulatory Visit: Payer: Self-pay | Admitting: Adult Health

## 2016-07-26 MED ORDER — ONETOUCH DELICA LANCETS 33G MISC: 11 refills | Status: DC

## 2016-07-28 ENCOUNTER — Other Ambulatory Visit: Payer: Self-pay

## 2016-09-11 ENCOUNTER — Ambulatory Visit: Payer: Medicare Other | Admitting: Adult Health

## 2016-09-11 DIAGNOSIS — Z23 Encounter for immunization: Secondary | ICD-10-CM | POA: Diagnosis not present

## 2016-09-14 ENCOUNTER — Encounter: Payer: Self-pay | Admitting: Adult Health

## 2016-09-15 ENCOUNTER — Ambulatory Visit (INDEPENDENT_AMBULATORY_CARE_PROVIDER_SITE_OTHER): Payer: Medicare Other | Admitting: Adult Health

## 2016-09-15 ENCOUNTER — Encounter: Payer: Self-pay | Admitting: Adult Health

## 2016-09-15 VITALS — BP 122/66 | HR 56 | Temp 97.8°F | Resp 20 | Ht 66.75 in | Wt 212.0 lb

## 2016-09-15 DIAGNOSIS — I2510 Atherosclerotic heart disease of native coronary artery without angina pectoris: Secondary | ICD-10-CM

## 2016-09-15 DIAGNOSIS — I10 Essential (primary) hypertension: Secondary | ICD-10-CM

## 2016-09-15 DIAGNOSIS — E119 Type 2 diabetes mellitus without complications: Secondary | ICD-10-CM

## 2016-09-15 LAB — BASIC METABOLIC PANEL
BUN: 22 mg/dL (ref 6–23)
CO2: 29 mEq/L (ref 19–32)
Calcium: 9.3 mg/dL (ref 8.4–10.5)
Chloride: 104 mEq/L (ref 96–112)
Creatinine, Ser: 1.1 mg/dL (ref 0.40–1.50)
GFR: 68.94 mL/min (ref >60.00)
Glucose, Bld: 116 mg/dL — ABNORMAL HIGH (ref 70–99)
Potassium: 4.4 mEq/L (ref 3.5–5.1)
Sodium: 139 mEq/L (ref 135–145)

## 2016-09-15 LAB — HEMOGLOBIN A1C: Hgb A1c MFr Bld: 6.2 % (ref 4.6–6.5)

## 2016-09-15 NOTE — Patient Instructions (Signed)
It was great seeing you today!  Please follow up with me for your physical. I will call you when I get your blood work back.   If you need anything please let me know

## 2016-09-15 NOTE — Progress Notes (Signed)
Subjective:    Patient ID: Ian [name], male    DOB: May 18, 1941, 75 y.o.   MRN: [id number]  HPI  74 year old male who  has a past medical history of Allergy; Apnea, sleep (07/28/07); Carotid artery stenosis; Coronary heart disease (2008); Diabetes mellitus; GERD (gastroesophageal reflux disease); Hyperlipidemia; Hypertension; and Hypospadias.  presents to the office today for follow up regarding diabetes. Ian [name] the summer in [place] his [place] and has just recently returned to [facility].   While in [place] he was examined for sided groin pain. During examination the provider felt an inguinal hernia that was easily reducible. This was thought to come from heavy lifting. Ian [name] reports today that his pain is gone from a 5 to a 1. He dos not wish to proceed with surgery at this time.   Due to history of coronary artery stenosis and heart disease. Ian [name] was a patient of Ian [name] in the past. He has not seen him for many years but would like to reestablish with him so that he has a cardiologist in [place] as well. Denies any chest pain shortness of breath or cardiac issues at this time.  Overall Ian [name] states that he is doing fairly well except for age-related aches and pains specifically in his bilateral knees. He takes Tylenol for this and does not want to do anything further at this time  Reports that his blood sugars have been well-controlled with low readings above 150  Review of Systems  Constitutional: Negative.   HENT: Negative.   Eyes: Negative.   Respiratory: Negative.   Cardiovascular: Negative.   Gastrointestinal: Negative.   Endocrine: Negative.   Musculoskeletal: Positive for arthralgias. Negative for gait problem and joint swelling.  Skin: Negative.   Neurological: Negative.   All other systems reviewed and are negative.  Past Medical History:  Diagnosis Date  . Allergy   . Apnea, sleep 07/28/07   NPSG [place], AHI 79.9  . Carotid artery stenosis     mild 04/2010, 05/2012  . Coronary heart disease 2008   Cath 2008, 60-70% proximal diagonal 1 stenosis,  RCA 40% stenosis.  nonischemic Lexiscan 03/2011  . Diabetes mellitus    type II  . GERD (gastroesophageal reflux disease)   . Hyperlipidemia   . Hypertension   . Hypospadias     Social History   Social History  . Marital status: Married    Spouse name: N/A  . Number of children: 3  . Years of education: N/A   Occupational History  . Retired Solicitor Retired   Social History Main Topics  . Smoking status: Never Smoker  . Smokeless tobacco: Never Used  . Alcohol use No  . Drug use: No  . Sexual activity: Not on file   Other Topics Concern  . Not on file   Social History Narrative   Married.  Three children from first marriage.  First wife died of ovarian cancer.     Retired, did work for [employer] for 44 years.     Past Surgical History:  Procedure Laterality Date  . NASAL SINUS SURGERY    . NISSEN FUNDOPLICATION    . TONSILLECTOMY    . VEIN LIGATION AND STRIPPING      Family History  Problem Relation Age of Onset  . Coronary artery disease Father 57    CABG  . Dementia Mother     Allergies  Allergen Reactions  . Iohexol      Desc:  pt. states severe reaction, dr. told patient never to have the dye again     Current Outpatient Prescriptions on File Prior to Visit  Medication Sig Dispense Refill  . aspirin 81 MG tablet Take 1 tablet (81 mg total) by mouth daily. 90 tablet 3  . atorvastatin (LIPITOR) 40 MG tablet Take 1 tablet (40 mg total) by mouth daily. (Patient taking differently: Take 40 mg by mouth every other day. ) 90 tablet 3  . Coenzyme Q10 150 MG CAPS Take 1 capsule (150 mg total) by mouth daily. 90 capsule 3  . doxazosin (CARDURA) 2 MG tablet Take 1 tablet (2 mg total) by mouth at bedtime. 90 tablet 3  . fexofenadine (ALLEGRA) 180 MG tablet Take 1 tablet (180 mg total) by mouth daily. 90 tablet 2  . gabapentin (NEURONTIN) 300 MG  capsule Take 1 capsule (300 mg total) by mouth 2 (two) times daily. 180 capsule 1  . glimepiride (AMARYL) 1 MG tablet Take 1 tablet (1 mg total) by mouth 2 (two) times daily. 180 tablet 3  . Glucosamine-Chondroitin 1500-1200 MG/30ML LIQD Twice a day 480 mL 12  . glucose blood (FREESTYLE LITE) test strip Test three times daily 50 each 3  . LORazepam (ATIVAN) 0.5 MG tablet Take 1 tablet (0.5 mg total) by mouth at bedtime as needed. 90 tablet 0  . metFORMIN (GLUCOPHAGE) 500 MG tablet Take 2 tablets with AM meal and 1 tablet with PM meal 270 tablet 3  . mirabegron ER (MYRBETRIQ) 50 MG TB24 tablet Take 1 tablet (50 mg total) by mouth daily. 90 tablet 3  . nebivolol (BYSTOLIC) 5 MG tablet Take 1 tablet (5 mg total) by mouth daily. 90 tablet 3  . olmesartan (BENICAR) 40 MG tablet Take 1 tablet (40 mg total) by mouth daily. 90 tablet 3  . Olopatadine HCl 0.6 % SOLN Two sprays into each nostril twice per day 30.5 g 6  . Omega-3 Fatty Acids (FISH OIL) 1200 MG CAPS Take 1 capsule (1,200 mg total) by mouth daily. 90 capsule 3  . omeprazole (PRILOSEC) 20 MG capsule 1 capsule twice daily 180 capsule 3  . ONETOUCH DELICA LANCETS 99991111 MISC Used to check blood sugar 3 times a day 100 each 11  . triamcinolone (KENALOG) 0.025 % cream Apply 1 application topically as needed.    . zolpidem (AMBIEN) 5 MG tablet TAKE 1 TABLET BY MOUTH AT BEDTIME AS NEEDED FOR SLEEF 90 tablet 0   No current facility-administered medications on file prior to visit.     BP 122/66 (BP Location: Left Arm, Patient Position: Sitting, Cuff Size: Normal)   Pulse (!) 56   Temp 97.8 F (36.6 C) (Oral)   Resp 20   Ht 5' 6.75" (1.695 m)   Wt 212 lb (96.2 kg)   SpO2 98%   BMI 33.45 kg/m       Objective:   Physical Exam  Constitutional: He is oriented to person, place, and time. He appears well-developed and well-nourished. No distress.  HENT:  Head: Normocephalic and atraumatic.  Right Ear: External ear normal.  Left Ear: External  ear normal.  Nose: Nose normal.  Mouth/Throat: Oropharynx is clear and moist. No oropharyngeal exudate.  Eyes: Conjunctivae and EOM are normal. Pupils are equal, round, and reactive to light. Right eye exhibits no discharge. Left eye exhibits no discharge. No scleral icterus.  Neck: No tracheal deviation present. No thyromegaly present.  Cardiovascular: Normal rate, regular rhythm, normal heart sounds and intact distal pulses.  Exam reveals no gallop and no friction rub.   No murmur heard. Pulmonary/Chest: Effort normal and breath sounds normal. No respiratory distress. He has no wheezes. He has no rales. He exhibits no tenderness.  Musculoskeletal: Normal range of motion. He exhibits no edema, tenderness or deformity.  Lymphadenopathy:    He has no cervical adenopathy.  Neurological: He is alert and oriented to person, place, and time. He has normal reflexes. He displays normal reflexes. No cranial nerve deficit. He exhibits normal muscle tone. Coordination normal.  Skin: Skin is warm and dry. No rash noted. He is not diaphoretic. No erythema. No pallor.  Psychiatric: He has a normal mood and affect. His behavior is normal. Judgment and thought content normal.  Nursing note and vitals reviewed.     Assessment & Plan:  1. Diabetes mellitus without complication ([place]) - Has been well controlled in the past. Will consider going up on metformin - Hemoglobin [id number] - Basic metabolic panel; Future - Basic metabolic panel - Would like him to follow-up in January for his yearly physical exam  2. Atherosclerosis of native coronary artery of native heart without angina pectoris  - Ambulatory referral to Cardiology  3. Essential hypertension - well controlled.  - No change in medication at this time  Dorothyann Peng, NP

## 2016-09-15 NOTE — Progress Notes (Signed)
Pre visit review using our clinic review tool, if applicable. No additional management support is needed unless otherwise documented below in the visit note. 

## 2016-09-17 ENCOUNTER — Encounter: Payer: Self-pay | Admitting: Adult Health

## 2016-09-23 ENCOUNTER — Other Ambulatory Visit: Payer: Self-pay | Admitting: Adult Health

## 2016-09-23 ENCOUNTER — Encounter: Payer: Self-pay | Admitting: Adult Health

## 2016-09-23 MED ORDER — FLUOCINOLONE ACETONIDE 0.01 % EX SOLN: Freq: Two times a day (BID) | CUTANEOUS | 0 refills | Status: DC

## 2016-10-21 ENCOUNTER — Other Ambulatory Visit: Payer: Self-pay | Admitting: Adult Health

## 2016-10-21 DIAGNOSIS — Z760 Encounter for issue of repeat prescription: Secondary | ICD-10-CM

## 2016-10-21 NOTE — Progress Notes (Signed)
Cardiology Office Note   Date:  10/22/2016   ID:  Ian [name], DOB 04/22/1941, MRN [id number]  PCP:  Dorothyann Peng, NP  Cardiologist:   Minus Breeding, MD  Referring:  Dorothyann Peng, NP  Chief Complaint  Patient presents with  . Coronary Artery Disease     History of Present Illness: Ian [name] is a 75 y.o. male who presents for follow up of CAD.  He had mild branch and non obstructive disease on cath in 2008.  He had a stress echo most recently in 2016.  He was getting most of his care in [place].  I did see him in 2013.  He is now going to be spending most of his time in [place] and wants to reestablish.     He does very well.  He exercises sometimes.  He has a treadmill.  The patient denies any new symptoms such as chest discomfort, neck or arm discomfort. There has been no new shortness of breath, PND or orthopnea. There have been no reported palpitations, presyncope or syncope.  Past Medical History:  Diagnosis Date  . Allergy   . Apnea, sleep 07/28/07   NPSG [place], AHI 79.9  . Carotid artery stenosis    mild 04/2010, 05/2012  . Coronary heart disease 2008   Cath 2008, 60-70% proximal diagonal 1 stenosis,  RCA 40% stenosis.  nonischemic Lexiscan 03/2011  . Diabetes mellitus    type II  . GERD (gastroesophageal reflux disease)   . Hyperlipidemia   . Hypertension   . Hypospadias     Past Surgical History:  Procedure Laterality Date  . NASAL SINUS SURGERY    . NISSEN FUNDOPLICATION    . TONSILLECTOMY    . VEIN LIGATION AND STRIPPING       Current Outpatient Prescriptions  Medication Sig Dispense Refill  . amLODipine (NORVASC) 5 MG tablet Take 5 mg by mouth daily.    Marland Kitchen aspirin 81 MG tablet Take 1 tablet (81 mg total) by mouth daily. 90 tablet 3  . atorvastatin (LIPITOR) 40 MG tablet Take 40 mg by mouth every other day.    . Coenzyme Q10 100 MG TABS Take 1 tablet by mouth daily.    . Coenzyme Q10 150 MG CAPS Take 1 capsule (150 mg total) by mouth daily. 90  capsule 3  . doxazosin (CARDURA) 2 MG tablet Take 1 tablet (2 mg total) by mouth at bedtime. 90 tablet 3  . fexofenadine (ALLEGRA) 180 MG tablet Take 1 tablet (180 mg total) by mouth daily. 90 tablet 2  . fluocinolone (SYNALAR) 0.01 % external solution Apply topically 2 (two) times daily. 60 mL 0  . gabapentin (NEURONTIN) 300 MG capsule TAKE 1 CAPSULE TWICE A DAY 180 capsule 1  . glimepiride (AMARYL) 1 MG tablet Take 1 tablet (1 mg total) by mouth 2 (two) times daily. 180 tablet 3  . Glucosamine-Chondroitin 1500-1200 MG/30ML LIQD Take 1 tablet by mouth 2 (two) times daily.    Marland Kitchen LORazepam (ATIVAN) 0.5 MG tablet Take 1 tablet (0.5 mg total) by mouth at bedtime as needed. 90 tablet 0  . metFORMIN (GLUCOPHAGE) 500 MG tablet Take 2 tablets with AM meal and 1 tablet with PM meal 270 tablet 3  . mirabegron ER (MYRBETRIQ) 50 MG TB24 tablet Take 1 tablet (50 mg total) by mouth daily. 90 tablet 3  . nebivolol (BYSTOLIC) 2.5 MG tablet Take 2.5 mg by mouth daily.    Marland Kitchen olmesartan (BENICAR) 40 MG tablet  Take 1 tablet (40 mg total) by mouth daily. 90 tablet 3  . Olopatadine HCl 0.6 % SOLN Two sprays into each nostril twice per day 30.5 g 6  . Omega-3 Fatty Acids (FISH OIL) 1200 MG CAPS Take 1 capsule (1,200 mg total) by mouth daily. 90 capsule 3  . zolpidem (AMBIEN) 5 MG tablet TAKE 1 TABLET BY MOUTH AT BEDTIME AS NEEDED FOR SLEEF 90 tablet 0   No current facility-administered medications for this visit.     Allergies:   Iohexol    Social History:  The patient  reports that he has never smoked. He has never used smokeless tobacco. He reports that he does not drink alcohol or use drugs.   Family History:  The patient's family history includes Coronary artery disease (age of onset: 1) in his father; Dementia in his mother.    ROS:  Please see the history of present illness.   Otherwise, review of systems are positive for none.   All other systems are reviewed and negative.    PHYSICAL EXAM: VS:  BP  136/60   Pulse (!) 56   Ht 5\' 7"  (1.702 m)   Wt 212 lb (96.2 kg)   BMI 33.20 kg/m  , BMI Body mass index is 33.2 kg/m. GENERAL:  Well appearing HEENT:  Pupils equal round and reactive, fundi not visualized, oral mucosa unremarkable NECK:  No jugular venous distention, waveform within normal limits, carotid upstroke brisk and symmetric, no bruits, no thyromegaly LYMPHATICS:  No cervical, inguinal adenopathy LUNGS:  Clear to auscultation bilaterally BACK:  No CVA tenderness CHEST:  Unremarkable HEART:  PMI not displaced or sustained,S1 and S2 within normal limits, no S3, no S4, no clicks, no rubs, no murmurs ABD:  Flat, positive bowel sounds normal in frequency in pitch, no bruits, no rebound, no guarding, no midline pulsatile mass, no hepatomegaly, no splenomegaly EXT:  2 plus pulses throughout, no edema, no cyanosis no clubbing SKIN:  No rashes no nodules NEURO:  Cranial nerves II through XII grossly intact, motor grossly intact throughout PSYCH:  Cognitively intact, oriented to person place and time    EKG:  EKG is ordered today. The ekg ordered today demonstrates sinus bradycardia, rate 56, axis within normal limits, right bundle branch block, no acute ST-T wave changes.   Recent Labs: 09/15/2016: BUN 22; Creatinine, Ser 1.10; Potassium 4.4; Sodium 139    Lipid Panel    Component Value Date/Time   CHOL 164 10/16/2015 0948   TRIG 108.0 10/16/2015 0948   HDL 41.00 10/16/2015 0948   CHOLHDL 4 10/16/2015 0948   VLDL 21.6 10/16/2015 0948   LDLCALC 101 (H) 10/16/2015 0948      Wt Readings from Last 3 Encounters:  10/22/16 212 lb (96.2 kg)  09/15/16 212 lb (96.2 kg)  04/08/16 207 lb 6.4 oz (94.1 kg)      Other studies Reviewed: Additional studies/ records that were reviewed today include: Records from [place]. Review of the above records demonstrates:  Please see elsewhere in the note.     ASSESSMENT AND PLAN:  CAD:  The patient had nonobstructive disease. He had a  negative stress test in 2016.  He has no new symptoms.  He will continue with risk reduction.   CAROTID STENOSIS:    This was mild and we will check it again in two years.   HTN:  The blood pressure is at target. No change in medications is indicated. We will continue with therapeutic lifestyle changes (TLC).  DYSLIPIDEMIA:  He is going to get a lipid profile in Jan.  I would suggest a goal should be an LDL in the 70s.  DM:  His A1c was 6.2 most recently. Continue the meds as listed.  CKD:  Of note he did have renal insufficiency when he had his catheterization in the past and this is to be noted that the records.  OBESITY:  The patient understands the need to lose weight with diet and exercise. We have discussed specific strategies for this.  He has lost 20 lbs over the last years.   Current medicines are reviewed at length with the patient today.  The patient does not have concerns regarding medicines.  The following changes have been made:  no change  Labs/ tests ordered today include: none  Orders Placed This Encounter  Procedures  . EKG 12-Lead     Disposition:   FU with me in six months.     Signed, Minus Breeding, MD  10/22/2016 2:14 PM    [facility]

## 2016-10-22 ENCOUNTER — Ambulatory Visit (INDEPENDENT_AMBULATORY_CARE_PROVIDER_SITE_OTHER): Payer: Medicare Other | Admitting: Cardiology

## 2016-10-22 ENCOUNTER — Encounter: Payer: Self-pay | Admitting: Cardiology

## 2016-10-22 VITALS — BP 136/60 | HR 56 | Ht 67.0 in | Wt 212.0 lb

## 2016-10-22 DIAGNOSIS — I2510 Atherosclerotic heart disease of native coronary artery without angina pectoris: Secondary | ICD-10-CM | POA: Diagnosis not present

## 2016-10-22 DIAGNOSIS — I10 Essential (primary) hypertension: Secondary | ICD-10-CM | POA: Diagnosis not present

## 2016-10-22 NOTE — Telephone Encounter (Signed)
Ok to refill for one year  

## 2016-10-22 NOTE — Patient Instructions (Signed)

## 2016-10-22 NOTE — Telephone Encounter (Signed)
Ok to refill 

## 2016-10-26 ENCOUNTER — Encounter: Payer: Self-pay | Admitting: Cardiology

## 2016-11-06 ENCOUNTER — Encounter: Payer: Self-pay | Admitting: Adult Health

## 2016-11-26 ENCOUNTER — Encounter: Payer: Self-pay | Admitting: Adult Health

## 2016-11-30 ENCOUNTER — Encounter: Payer: Self-pay | Admitting: Adult Health

## 2016-12-02 DIAGNOSIS — H2513 Age-related nuclear cataract, bilateral: Secondary | ICD-10-CM | POA: Diagnosis not present

## 2016-12-02 DIAGNOSIS — H40013 Open angle with borderline findings, low risk, bilateral: Secondary | ICD-10-CM | POA: Diagnosis not present

## 2016-12-02 DIAGNOSIS — E113291 Type 2 diabetes mellitus with mild nonproliferative diabetic retinopathy without macular edema, right eye: Secondary | ICD-10-CM | POA: Diagnosis not present

## 2016-12-02 DIAGNOSIS — H25013 Cortical age-related cataract, bilateral: Secondary | ICD-10-CM | POA: Diagnosis not present

## 2016-12-03 ENCOUNTER — Ambulatory Visit (INDEPENDENT_AMBULATORY_CARE_PROVIDER_SITE_OTHER): Payer: Medicare Other | Admitting: Adult Health

## 2016-12-03 ENCOUNTER — Encounter: Payer: Self-pay | Admitting: Adult Health

## 2016-12-03 ENCOUNTER — Ambulatory Visit (INDEPENDENT_AMBULATORY_CARE_PROVIDER_SITE_OTHER)
Admission: RE | Admit: 2016-12-03 | Discharge: 2016-12-03 | Disposition: A | Discharge status: Home / Self Care | Payer: Medicare Other | Source: Ambulatory Visit | Attending: Adult Health | Admitting: Adult Health

## 2016-12-03 VITALS — BP 126/74 | Ht 67.0 in | Wt 213.8 lb

## 2016-12-03 DIAGNOSIS — I10 Essential (primary) hypertension: Secondary | ICD-10-CM | POA: Diagnosis not present

## 2016-12-03 DIAGNOSIS — N401 Enlarged prostate with lower urinary tract symptoms: Secondary | ICD-10-CM | POA: Diagnosis not present

## 2016-12-03 DIAGNOSIS — R05 Cough: Secondary | ICD-10-CM | POA: Diagnosis not present

## 2016-12-03 DIAGNOSIS — E119 Type 2 diabetes mellitus without complications: Secondary | ICD-10-CM | POA: Diagnosis not present

## 2016-12-03 DIAGNOSIS — E785 Hyperlipidemia, unspecified: Secondary | ICD-10-CM

## 2016-12-03 DIAGNOSIS — M25551 Pain in right hip: Secondary | ICD-10-CM

## 2016-12-03 DIAGNOSIS — R059 Cough, unspecified: Secondary | ICD-10-CM

## 2016-12-03 DIAGNOSIS — M1611 Unilateral primary osteoarthritis, right hip: Secondary | ICD-10-CM | POA: Diagnosis not present

## 2016-12-03 DIAGNOSIS — Z760 Encounter for issue of repeat prescription: Secondary | ICD-10-CM

## 2016-12-03 DIAGNOSIS — N138 Other obstructive and reflux uropathy: Secondary | ICD-10-CM

## 2016-12-03 LAB — CBC WITH DIFFERENTIAL/PLATELET
Basophils Absolute: 0 10*3/uL (ref 0.0–0.1)
Basophils Relative: 0.5 % (ref 0.0–3.0)
Eosinophils Absolute: 0.5 10*3/uL (ref 0.0–0.7)
Eosinophils Relative: 10.4 % — ABNORMAL HIGH (ref 0.0–5.0)
HCT: 39.7 % (ref 39.0–52.0)
Hemoglobin: 13.6 g/dL (ref 13.0–17.0)
Lymphocytes Relative: 23.3 % (ref 12.0–46.0)
Lymphs Abs: 1.2 10*3/uL (ref 0.7–4.0)
MCHC: 34.1 g/dL (ref 30.0–36.0)
MCV: 87.1 fl (ref 78.0–100.0)
Monocytes Absolute: 0.3 10*3/uL (ref 0.1–1.0)
Monocytes Relative: 6.5 % (ref 3.0–12.0)
Neutro Abs: 3 10*3/uL (ref 1.4–7.7)
Neutrophils Relative %: 59.3 % (ref 43.0–77.0)
Platelets: 191 10*3/uL (ref 150.0–400.0)
RBC: 4.56 Mil/uL (ref 4.22–5.81)
RDW: 13.7 % (ref 11.5–15.5)
WBC: 5.1 10*3/uL (ref 4.0–10.5)

## 2016-12-03 LAB — BASIC METABOLIC PANEL
BUN: 18 mg/dL (ref 6–23)
CO2: 26 mEq/L (ref 19–32)
Calcium: 8.9 mg/dL (ref 8.4–10.5)
Chloride: 107 mEq/L (ref 96–112)
Creatinine, Ser: 1.08 mg/dL (ref 0.40–1.50)
GFR: 70.37 mL/min (ref >60.00)
Glucose, Bld: 120 mg/dL — ABNORMAL HIGH (ref 70–99)
Potassium: 4.1 mEq/L (ref 3.5–5.1)
Sodium: 139 mEq/L (ref 135–145)

## 2016-12-03 LAB — LIPID PANEL
Cholesterol: 184 mg/dL (ref 0–200)
HDL: 43.5 mg/dL (ref >39.00)
LDL Cholesterol: 118 mg/dL — ABNORMAL HIGH (ref 0–99)
NonHDL: 140.68
Total CHOL/HDL Ratio: 4
Triglycerides: 114 mg/dL (ref 0.0–149.0)
VLDL: 22.8 mg/dL (ref 0.0–40.0)

## 2016-12-03 LAB — HEMOGLOBIN A1C: Hgb A1c MFr Bld: 5.9 % (ref 4.6–6.5)

## 2016-12-03 LAB — HEPATIC FUNCTION PANEL
ALT: 11 U/L (ref 0–53)
AST: 16 U/L (ref 0–37)
Albumin: 4.1 g/dL (ref 3.5–5.2)
Alkaline Phosphatase: 40 U/L (ref 39–117)
Bilirubin, Direct: 0.1 mg/dL (ref 0.0–0.3)
Total Bilirubin: 1 mg/dL (ref 0.2–1.2)
Total Protein: 6.6 g/dL (ref 6.0–8.3)

## 2016-12-03 LAB — TSH: TSH: 0.86 u[IU]/mL (ref 0.35–4.50)

## 2016-12-03 LAB — PSA: PSA: 1.14 ng/mL (ref 0.10–4.00)

## 2016-12-03 MED ORDER — ZOLPIDEM TARTRATE 5 MG PO TABS: ORAL_TABLET | ORAL | 0 refills | Status: DC

## 2016-12-03 MED ORDER — OLMESARTAN MEDOXOMIL 40 MG PO TABS: 40.0000 mg | ORAL_TABLET | Freq: Every day | ORAL | 3 refills | Status: DC

## 2016-12-03 MED ORDER — LORAZEPAM 0.5 MG PO TABS: 0.5000 mg | ORAL_TABLET | Freq: Every evening | ORAL | 0 refills | Status: DC | PRN

## 2016-12-03 MED ORDER — GABAPENTIN 300 MG PO CAPS: 300.0000 mg | ORAL_CAPSULE | Freq: Two times a day (BID) | ORAL | 3 refills | Status: DC

## 2016-12-03 MED ORDER — MIRABEGRON ER 50 MG PO TB24: 50.0000 mg | ORAL_TABLET | Freq: Every day | ORAL | 3 refills | Status: DC

## 2016-12-03 MED ORDER — METFORMIN HCL 500 MG PO TABS: ORAL_TABLET | ORAL | 3 refills | Status: DC

## 2016-12-03 MED ORDER — ATORVASTATIN CALCIUM 40 MG PO TABS: 40.0000 mg | ORAL_TABLET | ORAL | 3 refills | Status: DC

## 2016-12-03 MED ORDER — AMLODIPINE BESYLATE 5 MG PO TABS: 5.0000 mg | ORAL_TABLET | Freq: Every day | ORAL | 3 refills | Status: DC

## 2016-12-03 MED ORDER — PREDNISONE 10 MG PO TABS: ORAL_TABLET | ORAL | 0 refills | Status: DC

## 2016-12-03 MED ORDER — GLIMEPIRIDE 1 MG PO TABS: 1.0000 mg | ORAL_TABLET | Freq: Two times a day (BID) | ORAL | 3 refills | Status: DC

## 2016-12-03 MED ORDER — DOXAZOSIN MESYLATE 2 MG PO TABS: 2.0000 mg | ORAL_TABLET | Freq: Every day | ORAL | 3 refills | Status: DC

## 2016-12-03 NOTE — Telephone Encounter (Signed)
Spoke to Ian [name] and informed him of his labs and xrays.   We are going to treat possible bronchitis with a prednisone taper.   He is going to wait and see if his right hip pain goes away before seeing orthopedics.

## 2016-12-03 NOTE — Patient Instructions (Signed)
It was great seeing you!  I will follow up with you regarding your chest x ray, hip x ray and labs

## 2016-12-03 NOTE — Progress Notes (Signed)
Subjective:    Patient ID: Ian [name], male    DOB: 1941/04/08, 76 y.o.   MRN: [id number]  HPI   Patient presents for yearly follow up exam. He is a pleasant 76 year old male who  has a past medical history of Allergy; Apnea, sleep (07/28/07); Carotid artery stenosis; Coronary heart disease (2008); Diabetes mellitus; GERD (gastroesophageal reflux disease); Hyperlipidemia; Hypertension; and Hypospadias.  All immunizations and health maintenance protocols were reviewed with the patient and needed orders were placed.  Appropriate screening laboratory values were ordered for the patient including screening of hyperlipidemia, renal function and hepatic function. If indicated by BPH, a PSA was ordered.  Medication reconciliation,  past medical history, social history, problem list and allergies were reviewed in detail with the patient  Goals were established with regard to weight loss, exercise, and  diet in compliance with medications. He exercises sometimes.   End of life planning was discussed.  He takes metformin 500mg  and amaryl for diabetes. He reports that his blood sugars are well controlled a thome  He takes Benicar 40mg , Bystolic 2.5mg  and norvasc 5 mg for hypertension management.   Lipitor for hyperlipidemia.   His last colonoscopy was in 2010. He has aged out at this point. He has done his dental and vision exams.   His acute complaint is that of a cough that has been getting worse over the last two months. The cough is consistent throughout the day and night. He has seasonal allergies and sinus issues and takes OTC allergy  Medication  Over all he is doing well, complains of generalized aches and pains in his lower extremities, but reports that he is not concerned about it.   Review of Systems  Constitutional: Negative.   HENT: Negative.   Eyes: Negative.   Respiratory: Positive for cough and wheezing. Negative for apnea and shortness of breath.   Cardiovascular:  Negative.   Gastrointestinal: Negative.   Endocrine: Negative.   Genitourinary: Negative.   Musculoskeletal: Positive for arthralgias.  Skin: Negative.   Allergic/Immunologic: Negative.   Neurological: Negative.   Hematological: Negative.   Psychiatric/Behavioral: Negative.   All other systems reviewed and are negative.  Past Medical History:  Diagnosis Date  . Allergy   . Apnea, sleep 07/28/07   NPSG [place], AHI 79.9  . Carotid artery stenosis    mild 04/2010, 05/2012  . Coronary heart disease 2008   Cath 2008, 60-70% proximal diagonal 1 stenosis,  RCA 40% stenosis.  nonischemic Lexiscan 03/2011  . Diabetes mellitus    type II  . GERD (gastroesophageal reflux disease)   . Hyperlipidemia   . Hypertension   . Hypospadias     Social History   Social History  . Marital status: Married    Spouse name: N/A  . Number of children: 3  . Years of education: N/A   Occupational History  . Retired Solicitor Retired   Social History Main Topics  . Smoking status: Never Smoker  . Smokeless tobacco: Never Used  . Alcohol use No  . Drug use: No  . Sexual activity: Not on file   Other Topics Concern  . Not on file   Social History Narrative   Married.  Three children from first marriage.  First wife died of ovarian cancer.     Retired, did work for [employer] for 44 years.     Past Surgical History:  Procedure Laterality Date  . NASAL SINUS SURGERY    . NISSEN  FUNDOPLICATION    . TONSILLECTOMY    . VEIN LIGATION AND STRIPPING      Family History  Problem Relation Age of Onset  . Coronary artery disease Father 60    CABG  . Dementia Mother     Allergies  Allergen Reactions  . Iohexol      Desc: pt. states severe reaction, dr. told patient never to have the dye again     Current Outpatient Prescriptions on File Prior to Visit  Medication Sig Dispense Refill  . amLODipine (NORVASC) 5 MG tablet Take 5 mg by mouth daily.    Marland Kitchen aspirin 81 MG tablet Take  1 tablet (81 mg total) by mouth daily. 90 tablet 3  . atorvastatin (LIPITOR) 40 MG tablet Take 40 mg by mouth every other day.    . Coenzyme Q10 100 MG TABS Take 1 tablet by mouth daily.    . Coenzyme Q10 150 MG CAPS Take 1 capsule (150 mg total) by mouth daily. 90 capsule 3  . doxazosin (CARDURA) 2 MG tablet Take 1 tablet (2 mg total) by mouth at bedtime. 90 tablet 3  . fexofenadine (ALLEGRA) 180 MG tablet Take 1 tablet (180 mg total) by mouth daily. 90 tablet 2  . fluocinolone (SYNALAR) 0.01 % external solution Apply topically 2 (two) times daily. 60 mL 0  . gabapentin (NEURONTIN) 300 MG capsule TAKE 1 CAPSULE TWICE A DAY 180 capsule 1  . glimepiride (AMARYL) 1 MG tablet Take 1 tablet (1 mg total) by mouth 2 (two) times daily. 180 tablet 3  . Glucosamine-Chondroitin 1500-1200 MG/30ML LIQD Take 1 tablet by mouth 2 (two) times daily.    Marland Kitchen LORazepam (ATIVAN) 0.5 MG tablet Take 1 tablet (0.5 mg total) by mouth at bedtime as needed. 90 tablet 0  . metFORMIN (GLUCOPHAGE) 500 MG tablet Take 2 tablets with AM meal and 1 tablet with PM meal 270 tablet 3  . mirabegron ER (MYRBETRIQ) 50 MG TB24 tablet Take 1 tablet (50 mg total) by mouth daily. 90 tablet 3  . nebivolol (BYSTOLIC) 2.5 MG tablet Take 2.5 mg by mouth daily.    Marland Kitchen olmesartan (BENICAR) 40 MG tablet Take 1 tablet (40 mg total) by mouth daily. 90 tablet 3  . Olopatadine HCl 0.6 % SOLN Two sprays into each nostril twice per day 30.5 g 6  . Omega-3 Fatty Acids (FISH OIL) 1200 MG CAPS Take 1 capsule (1,200 mg total) by mouth daily. 90 capsule 3  . zolpidem (AMBIEN) 5 MG tablet TAKE 1 TABLET BY MOUTH AT BEDTIME AS NEEDED FOR SLEEF 90 tablet 0   No current facility-administered medications on file prior to visit.     BP 126/74   Ht 5\' 7"  (1.702 m)   Wt 213 lb 12.8 oz (97 kg)   BMI 33.49 kg/m        Objective:   Physical Exam  Constitutional: He is oriented to person, place, and time. He appears well-developed and well-nourished. No  distress.  HENT:  Head: Normocephalic and atraumatic.  Right Ear: Hearing, tympanic membrane, external ear and ear canal normal.  Left Ear: Hearing, tympanic membrane, external ear and ear canal normal.  Nose: Nose normal.  Mouth/Throat: Uvula is midline and oropharynx is clear and moist. No oropharyngeal exudate.  Eyes: Conjunctivae and EOM are normal. Pupils are equal, round, and reactive to light. Right eye exhibits no discharge. Left eye exhibits no discharge. No scleral icterus.  Neck: Normal range of motion. Neck supple. No JVD  present. No tracheal deviation present. No thyromegaly present.  Cardiovascular: Normal rate, regular rhythm, normal heart sounds and intact distal pulses.  Exam reveals no gallop and no friction rub.   No murmur heard. Pulmonary/Chest: Effort normal. No stridor. No respiratory distress. He has wheezes (trace wheezing throughout). He has no rales. He exhibits no tenderness.  Abdominal: Soft. Bowel sounds are normal. He exhibits no distension and no mass. There is no tenderness. There is no rebound and no guarding.  Musculoskeletal: Normal range of motion.  Lymphadenopathy:    He has no cervical adenopathy.  Neurological: He is alert and oriented to person, place, and time. No cranial nerve deficit. Coordination normal.  Skin: Skin is warm and dry. No rash noted. He is not diaphoretic. No erythema. No pallor.  Psychiatric: He has a normal mood and affect. His behavior is normal. Judgment and thought content normal.  Nursing note and vitals reviewed.     Assessment & Plan:  1. Essential hypertension - Well controlled on current medication - Basic metabolic panel - CBC with Differential/Platelet - Hemoglobin A1c - Hepatic function panel - Lipid panel - POCT Urinalysis Dipstick (Automated) - TSH - PSA  2. Diabetes mellitus without complication (HCC) - Basic metabolic panel - CBC with Differential/Platelet - Hemoglobin A1c - Hepatic function panel -  Lipid panel - POCT Urinalysis Dipstick (Automated) - TSH - PSA - Consider increasing Metformin  3. Hyperlipidemia, unspecified hyperlipidemia type  - Basic metabolic panel - CBC with Differential/Platelet - Hemoglobin A1c - Hepatic function panel - Lipid panel - POCT Urinalysis Dipstick (Automated) - TSH - PSA  4. Cough - likely bronchitis but will get chest x ray due to time frame - DG Chest 2 View; Future - Will treat accordingly  5. Right hip pain  - DG HIP UNILAT WITH PELVIS 2-3 VIEWS RIGHT; Future  6. BPH with urinary obstruction - PSA  Dorothyann Peng, NP

## 2016-12-18 ENCOUNTER — Encounter: Payer: Self-pay | Admitting: Adult Health

## 2016-12-21 ENCOUNTER — Encounter: Payer: Self-pay | Admitting: Adult Health

## 2016-12-23 ENCOUNTER — Encounter: Payer: Self-pay | Admitting: Adult Health

## 2016-12-23 ENCOUNTER — Ambulatory Visit (INDEPENDENT_AMBULATORY_CARE_PROVIDER_SITE_OTHER): Payer: Medicare Other | Admitting: Adult Health

## 2016-12-23 VITALS — BP 150/70 | Temp 97.7°F | Ht 67.0 in | Wt 211.8 lb

## 2016-12-23 DIAGNOSIS — S39012A Strain of muscle, fascia and tendon of lower back, initial encounter: Secondary | ICD-10-CM

## 2016-12-23 DIAGNOSIS — M25551 Pain in right hip: Secondary | ICD-10-CM | POA: Diagnosis not present

## 2016-12-23 MED ORDER — HYDROCORTISONE 2.5 % EX CREA: TOPICAL_CREAM | Freq: Two times a day (BID) | CUTANEOUS | 3 refills | Status: DC

## 2016-12-23 MED ORDER — METHYLPREDNISOLONE 4 MG PO TBPK: ORAL_TABLET | ORAL | 0 refills | Status: DC

## 2016-12-23 MED ORDER — CYCLOBENZAPRINE HCL 10 MG PO TABS: 10.0000 mg | ORAL_TABLET | Freq: Three times a day (TID) | ORAL | 0 refills | Status: DC | PRN

## 2016-12-23 MED ORDER — TRAMADOL HCL 50 MG PO TABS: 50.0000 mg | ORAL_TABLET | Freq: Three times a day (TID) | ORAL | 0 refills | Status: DC | PRN

## 2016-12-23 NOTE — Progress Notes (Signed)
Subjective:    Patient ID: Ian [name], male    DOB: 05-Oct-1941, 76 y.o.   MRN: [id number]  HPI  76 year old male who  has a past medical history of Allergy; Apnea, sleep (07/28/07); Carotid artery stenosis; Coronary heart disease (2008); Diabetes mellitus; GERD (gastroesophageal reflux disease); Hyperlipidemia; Hypertension; and Hypospadias. He presents to the office today for the complaint of right lower back pain and right hip pain.   He reports that the pain in the lower back is described as "sharp" and has been presents for about two weeks. The pain is worse with changing positions, like going from a sitting to standing position.When he is sitting for extended periods of time the pain is described as "constant dull" pain. While in [place] over the week he took some of his daughters Tramadol and he reports that this helped significantly with the pain. He did not take any today as he did not want to mask the pain. The pain has returned. He feels as though this is muscle related.  He denies any trauma or precipitating factors  As for the right hip pain. This has been an ongoing issue that has been becoming progressively worse. The pain is more apparent when he is walking for long periods and feels as though this is more of a joint pain. He had an x ray at the end of January 2018 which showed mild degenerative disease. He feels as though it is time to see orthopedics   He denies any issues with numbness or tingling in his lower extremities. No issues with bowel o rbladder.   Review of Systems See HPI  Past Medical History:  Diagnosis Date  . Allergy   . Apnea, sleep 07/28/07   NPSG [place], AHI 79.9  . Carotid artery stenosis    mild 04/2010, 05/2012  . Coronary heart disease 2008   Cath 2008, 60-70% proximal diagonal 1 stenosis,  RCA 40% stenosis.  nonischemic Lexiscan 03/2011  . Diabetes mellitus    type II  . GERD (gastroesophageal reflux disease)   . Hyperlipidemia   .  Hypertension   . Hypospadias     Social History   Social History  . Marital status: Married    Spouse name: N/A  . Number of children: 3  . Years of education: N/A   Occupational History  . Retired Solicitor Retired   Social History Main Topics  . Smoking status: Never Smoker  . Smokeless tobacco: Never Used  . Alcohol use No  . Drug use: No  . Sexual activity: Not on file   Other Topics Concern  . Not on file   Social History Narrative   Married.  Three children from first marriage.  First wife died of ovarian cancer.     Retired, did work for [employer] for 44 years.     Past Surgical History:  Procedure Laterality Date  . NASAL SINUS SURGERY    . NISSEN FUNDOPLICATION    . TONSILLECTOMY    . VEIN LIGATION AND STRIPPING      Family History  Problem Relation Age of Onset  . Coronary artery disease Father 76    CABG  . Dementia Mother     Allergies  Allergen Reactions  . Iohexol      Desc: pt. states severe reaction, dr. told patient never to have the dye again     Current Outpatient Prescriptions on File Prior to Visit  Medication Sig Dispense Refill  .  amLODipine (NORVASC) 5 MG tablet Take 1 tablet (5 mg total) by mouth daily. 90 tablet 3  . aspirin 81 MG tablet Take 1 tablet (81 mg total) by mouth daily. 90 tablet 3  . atorvastatin (LIPITOR) 40 MG tablet Take 1 tablet (40 mg total) by mouth every other day. 90 tablet 3  . Coenzyme Q10 150 MG CAPS Take 1 capsule (150 mg total) by mouth daily. 90 capsule 3  . doxazosin (CARDURA) 2 MG tablet Take 1 tablet (2 mg total) by mouth at bedtime. 90 tablet 3  . fexofenadine (ALLEGRA) 180 MG tablet Take 1 tablet (180 mg total) by mouth daily. 90 tablet 2  . fluocinolone (SYNALAR) 0.01 % external solution Apply topically 2 (two) times daily. 60 mL 0  . gabapentin (NEURONTIN) 300 MG capsule Take 1 capsule (300 mg total) by mouth 2 (two) times daily. 180 capsule 3  . glimepiride (AMARYL) 1 MG tablet Take 1  tablet (1 mg total) by mouth 2 (two) times daily. 180 tablet 3  . Glucosamine-Chondroitin 1500-1200 MG/30ML LIQD Take 1 tablet by mouth 2 (two) times daily.    Marland Kitchen LORazepam (ATIVAN) 0.5 MG tablet Take 1 tablet (0.5 mg total) by mouth at bedtime as needed. 30 tablet 0  . metFORMIN (GLUCOPHAGE) 500 MG tablet Take 2 tablets with AM meal and 1 tablet with PM meal 270 tablet 3  . mirabegron ER (MYRBETRIQ) 50 MG TB24 tablet Take 1 tablet (50 mg total) by mouth daily. 90 tablet 3  . nebivolol (BYSTOLIC) 2.5 MG tablet Take 2.5 mg by mouth daily.    Marland Kitchen olmesartan (BENICAR) 40 MG tablet Take 1 tablet (40 mg total) by mouth daily. 90 tablet 3  . Olopatadine HCl 0.6 % SOLN Two sprays into each nostril twice per day 30.5 g 6  . Omega-3 Fatty Acids (FISH OIL) 1200 MG CAPS Take 1 capsule (1,200 mg total) by mouth daily. 90 capsule 3  . predniSONE (DELTASONE) 10 MG tablet 40 mg x 3 days, 20 mg x 3 days, 10 mg x 3 days 21 tablet 0  . zolpidem (AMBIEN) 5 MG tablet TAKE 1 TABLET BY MOUTH AT BEDTIME AS NEEDED FOR SLEEP 30 tablet 0   No current facility-administered medications on file prior to visit.     BP (!) 150/70   Temp 97.7 F (36.5 C) (Oral)   Ht 5\' 7"  (1.702 m)   Wt 211 lb 12.8 oz (96.1 kg)   BMI 33.17 kg/m       Objective:   Physical Exam  Constitutional: He is oriented to person, place, and time. He appears well-developed and well-nourished. No distress.  Cardiovascular: Normal rate, regular rhythm, normal heart sounds and intact distal pulses.  Exam reveals no gallop and no friction rub.   No murmur heard. Pulmonary/Chest: Effort normal and breath sounds normal. No respiratory distress. He has no wheezes. He has no rales. He exhibits no tenderness.  Musculoskeletal: He exhibits no edema or deformity.  Unable to reproduce pain with palpation as this actually improved the pain in his lower back   Neurological: He is alert and oriented to person, place, and time.  Skin: Skin is warm and dry. No  rash noted. He is not diaphoretic. No erythema. No pallor.  Psychiatric: He has a normal mood and affect. His behavior is normal. Judgment and thought content normal.  Nursing note and vitals reviewed.     Assessment & Plan:  1. Right hip pain - AMB referral to orthopedics -  traMADol (ULTRAM) 50 MG tablet; Take 1 tablet (50 mg total) by mouth every 8 (eight) hours as needed.  Dispense: 30 tablet; Refill: 0  2. Lumbosacral strain, initial encounter  - cyclobenzaprine (FLEXERIL) 10 MG tablet; Take 1 tablet (10 mg total) by mouth 3 (three) times daily as needed for muscle spasms.  Dispense: 30 tablet; Refill: 0 - methylPREDNISolone (MEDROL DOSEPAK) 4 MG TBPK tablet; Take as directed  Dispense: 21 tablet; Refill: 0 - traMADol (ULTRAM) 50 MG tablet; Take 1 tablet (50 mg total) by mouth every 8 (eight) hours as needed.  Dispense: 30 tablet; Refill: 0 - Do not take Ambian with flexeril or tramadol  - Follow up if no improvement  Dorothyann Peng, NP

## 2017-01-04 ENCOUNTER — Encounter (INDEPENDENT_AMBULATORY_CARE_PROVIDER_SITE_OTHER): Payer: Self-pay | Admitting: Orthopaedic Surgery

## 2017-01-04 ENCOUNTER — Ambulatory Visit (INDEPENDENT_AMBULATORY_CARE_PROVIDER_SITE_OTHER): Payer: Medicare Other | Admitting: Orthopaedic Surgery

## 2017-01-04 DIAGNOSIS — M25551 Pain in right hip: Secondary | ICD-10-CM

## 2017-01-04 NOTE — Progress Notes (Signed)
Office Visit Note   Patient: Ian [name]           Date of Birth: [birth year]           MRN: [id number] Visit Date: 01/04/2017              Requested by: Dorothyann Peng, NP [place] [place], [place] [address] PCP: Dorothyann Peng, NP   Assessment & Plan: Visit Diagnoses:  1. Pain of right hip joint     Plan: His x-ray show mild degenerative changes with mild joint space narrowing. I would like to try a steroid injection into the hip to rule out intra-articular pathology. I think that he may have pathology coming from his short external rotators. Follow-up with me if not better from the injection.  Follow-Up Instructions: Return if symptoms worsen or fail to improve.   Orders:  No orders of the defined types were placed in this encounter.  No orders of the defined types were placed in this encounter.     Procedures: No procedures performed   Clinical Data: No additional findings.   Subjective: Chief Complaint  Patient presents with  . Right Hip - Pain    Patient is 76 year old gentleman with right hip pain first several years. He may have had a remote injury many years ago when he stretched over a desk and felt a snap. He has chronic pain that he feels like it is deep down the joint. It does feel soreness down in the buttock region. He denies any groin pain. He denies any lateral hip pain.    Review of Systems Complete review of systems negative except for history of present illness  Objective: Vital Signs: There were no vitals taken for this visit.  Physical Exam  Constitutional: He is oriented to person, place, and time. He appears well-developed and well-nourished.  HENT:  Head: Normocephalic and atraumatic.  Eyes: Pupils are equal, round, and reactive to light.  Neck: Neck supple.  Pulmonary/Chest: Effort normal.  Abdominal: Soft.  Musculoskeletal: Normal range of motion.  Neurological: He is alert and oriented to person, place, and time.    Skin: Skin is warm.  Psychiatric: He has a normal mood and affect. His behavior is normal. Judgment and thought content normal.  Nursing note and vitals reviewed.   Ortho Exam Exam of the right hip shows good painless range of motion of the hip. Negative Stinchfield sign. No sciatic tension signs. Hip is nontender posteriorly. Specialty Comments:  No specialty comments available.  Imaging: No results found.   PMFS History: Patient Active Problem List   Diagnosis Date Noted  . Encounter to establish care 04/05/2015  . IBS (irritable bowel syndrome) 12/12/2014  . Erectile dysfunction 01/19/2014  . Diabetic polyneuropathy ([place]) 11/20/2013  . Shortness of breath 09/26/2012  . Chronic insomnia 03/25/2012  . PALPITATIONS 11/19/2010  . URINARY INCONTINENCE, PASSIVE, CONTINUOUS LEAKAGE 11/19/2010  . Disorder resulting from impaired renal function 11/13/2009  . VARICOSE VEINS, LOWER EXTREMITIES 09/17/2009  . BPH with urinary obstruction 09/17/2009  . RESTLESS LEG SYNDROME 02/12/2009  . HIP PAIN, RIGHT 02/12/2009  . DIAPHRAGMATIC DISORDER 10/26/2008  . Diabetes mellitus without complication ([place]) 0000000  . Hyperlipidemia 01/08/2008  . Essential hypertension 01/08/2008  . Coronary atherosclerosis 01/08/2008  . ALLERGIC RHINITIS 01/08/2008  . SLEEP APNEA 01/08/2008   Past Medical History:  Diagnosis Date  . Allergy   . Apnea, sleep 07/28/07   NPSG [place], AHI 79.9  . Carotid artery stenosis  mild 04/2010, 05/2012  . Coronary heart disease 2008   Cath 2008, 60-70% proximal diagonal 1 stenosis,  RCA 40% stenosis.  nonischemic Lexiscan 03/2011  . Diabetes mellitus    type II  . GERD (gastroesophageal reflux disease)   . Hyperlipidemia   . Hypertension   . Hypospadias     Family History  Problem Relation Age of Onset  . Coronary artery disease Father 62    CABG  . Dementia Mother     Past Surgical History:  Procedure Laterality Date  . NASAL SINUS SURGERY    .  NISSEN FUNDOPLICATION    . TONSILLECTOMY    . VEIN LIGATION AND STRIPPING     Social History   Occupational History  . Retired Solicitor Retired   Social History Main Topics  . Smoking status: Never Smoker  . Smokeless tobacco: Never Used  . Alcohol use No  . Drug use: No  . Sexual activity: Not on file

## 2017-01-14 ENCOUNTER — Telehealth (INDEPENDENT_AMBULATORY_CARE_PROVIDER_SITE_OTHER): Payer: Self-pay | Admitting: *Deleted

## 2017-01-14 DIAGNOSIS — M25551 Pain in right hip: Secondary | ICD-10-CM

## 2017-01-14 NOTE — Telephone Encounter (Signed)
Patient called this afternoon in regards to wanting to know when he could have a hip injection? He was supposed to be referred to someone for this? I am thinking he is supposed to be seeing Dr Ernestina Patches maybe? His CB # [phone]. Thank you

## 2017-01-14 NOTE — Telephone Encounter (Signed)
Called pt to advise they will callhim to set up appt

## 2017-01-22 ENCOUNTER — Encounter (INDEPENDENT_AMBULATORY_CARE_PROVIDER_SITE_OTHER): Payer: Self-pay | Admitting: Physical Medicine and Rehabilitation

## 2017-01-22 ENCOUNTER — Ambulatory Visit (INDEPENDENT_AMBULATORY_CARE_PROVIDER_SITE_OTHER): Payer: Medicare Other | Admitting: Physical Medicine and Rehabilitation

## 2017-01-22 ENCOUNTER — Ambulatory Visit (INDEPENDENT_AMBULATORY_CARE_PROVIDER_SITE_OTHER): Payer: Medicare Other

## 2017-01-22 VITALS — BP 156/79 | HR 56

## 2017-01-22 DIAGNOSIS — M25551 Pain in right hip: Secondary | ICD-10-CM | POA: Diagnosis not present

## 2017-01-22 NOTE — Patient Instructions (Signed)

## 2017-01-22 NOTE — Progress Notes (Addendum)
Ian [name] - 76 y.o. male MRN [id number]  Date of birth: Apr 10, 1941  Office Visit Note: Visit Date: 01/22/2017 PCP: Dorothyann Peng, NP Referred by: Dorothyann Peng, NP  Subjective: Chief Complaint  Patient presents with  . Right Hip - Pain   HPI: Ian [name] is a 76 year old gentleman with right hip pain for months and worse recently. Worse after walking long periods. Ok while he is walking but hurts after. Occasionally  right thigh pain. Denies groin pain. Dr. Erlinda Hong request diagnostic anesthetic hip arthrogram. The patient states she can feel some of the symptoms in general but really usually is after he walks. I have told him to go ahead today after the injection during the anesthetic phase to do some walking and see if he can reproduce the pain.    ROS Otherwise per HPI.  Assessment & Plan: Visit Diagnoses:  1. Pain in right hip     Plan: Findings:  Right hip anesthetic arthrogram. The patient did seem to get some relief with certain movements during the anesthetic phase. He is going to take the next 2-3 hours to do some walking and see how that feels.    Meds & Orders:  Meds ordered this encounter  Medications  . bupivacaine (MARCAINE) 0.5 % (with pres) injection 3 mL  . triamcinolone acetonide (KENALOG-40) injection 80 mg    Orders Placed This Encounter  Procedures  . Large Joint Injection/Arthrocentesis  . XR C-ARM NO REPORT    Follow-up: Return if symptoms worsen or fail to improve, for Dr. Erlinda Hong.   Procedures: Hip anesthetic arthrogram. Date/Time: 01/22/2017 10:24 AM Performed by: Magnus Sinning Authorized by: Magnus Sinning   Consent Given by:  Patient Site marked: the procedure site was marked   Timeout: prior to procedure the correct patient, procedure, and site was verified   Indications:  Pain and diagnostic evaluation Location:  Hip Site:  R hip joint Prep: patient was prepped and draped in usual sterile fashion   Needle Size:  22 G Approach:   Anterior Ultrasound Guidance: No   Fluoroscopic Guidance: No   Arthrogram: Yes   Medications:  3 mL bupivacaine 0.5 %; 80 mg triamcinolone acetonide 40 MG/ML Aspiration Attempted: Yes   Patient tolerance:  Patient tolerated the procedure well with no immediate complications  Arthrogram demonstrated excellent flow of contrast throughout the joint surface without extravasation or obvious defect.  The patient had mild relief of symptoms during the anesthetic phase of the injection.       No notes on file   Clinical History: No specialty comments available.  He reports that he has never smoked. He has never used smokeless tobacco.   Recent Labs  03/27/16 1020 09/15/16 1056 12/03/16 1027  HGBA1C 6.5 6.2 5.9    Objective:  VS:  HT:    WT:   BMI:     BP:(!) 156/79  HR:(!) 56bpm  TEMP: ( )  RESP:  Physical Exam  Ortho Exam Imaging: No results found.  Past Medical/Family/Surgical/Social History: Medications & Allergies reviewed per EMR Patient Active Problem List   Diagnosis Date Noted  . Encounter to establish care 04/05/2015  . IBS (irritable bowel syndrome) 12/12/2014  . Erectile dysfunction 01/19/2014  . Diabetic polyneuropathy ([place]) 11/20/2013  . Shortness of breath 09/26/2012  . Chronic insomnia 03/25/2012  . PALPITATIONS 11/19/2010  . URINARY INCONTINENCE, PASSIVE, CONTINUOUS LEAKAGE 11/19/2010  . Disorder resulting from impaired renal function 11/13/2009  . VARICOSE VEINS, LOWER EXTREMITIES 09/17/2009  . BPH with  urinary obstruction 09/17/2009  . RESTLESS LEG SYNDROME 02/12/2009  . HIP PAIN, RIGHT 02/12/2009  . DIAPHRAGMATIC DISORDER 10/26/2008  . Diabetes mellitus without complication ([place]) 97/58/8325  . Hyperlipidemia 01/08/2008  . Essential hypertension 01/08/2008  . Coronary atherosclerosis 01/08/2008  . ALLERGIC RHINITIS 01/08/2008  . SLEEP APNEA 01/08/2008   Past Medical History:  Diagnosis Date  . Allergy   . Apnea, sleep 07/28/07   NPSG  [place], AHI 79.9  . Carotid artery stenosis    mild 04/2010, 05/2012  . Coronary heart disease 2008   Cath 2008, 60-70% proximal diagonal 1 stenosis,  RCA 40% stenosis.  nonischemic Lexiscan 03/2011  . Diabetes mellitus    type II  . GERD (gastroesophageal reflux disease)   . Hyperlipidemia   . Hypertension   . Hypospadias    Family History  Problem Relation Age of Onset  . Coronary artery disease Father 87    CABG  . Dementia Mother    Past Surgical History:  Procedure Laterality Date  . NASAL SINUS SURGERY    . NISSEN FUNDOPLICATION    . TONSILLECTOMY    . VEIN LIGATION AND STRIPPING     Social History   Occupational History  . Retired Solicitor Retired   Social History Main Topics  . Smoking status: Never Smoker  . Smokeless tobacco: Never Used  . Alcohol use No  . Drug use: No  . Sexual activity: Not on file

## 2017-01-25 MED ORDER — BUPIVACAINE HCL 0.5 % IJ SOLN
3.0000 mL | INTRAMUSCULAR | Status: AC | PRN
  Administered 2017-01-22: 3 mL via INTRA_ARTICULAR

## 2017-01-25 MED ORDER — TRIAMCINOLONE ACETONIDE 40 MG/ML IJ SUSP
80.0000 mg | INTRAMUSCULAR | Status: AC | PRN
  Administered 2017-01-22: 80 mg via INTRA_ARTICULAR

## 2017-02-16 ENCOUNTER — Encounter: Payer: Self-pay | Admitting: Adult Health

## 2017-02-16 ENCOUNTER — Other Ambulatory Visit: Payer: Self-pay | Admitting: Adult Health

## 2017-02-16 DIAGNOSIS — M25551 Pain in right hip: Secondary | ICD-10-CM

## 2017-02-16 DIAGNOSIS — S39012A Strain of muscle, fascia and tendon of lower back, initial encounter: Secondary | ICD-10-CM

## 2017-02-16 MED ORDER — TRAMADOL HCL 50 MG PO TABS: 50.0000 mg | ORAL_TABLET | Freq: Three times a day (TID) | ORAL | 0 refills | Status: DC | PRN

## 2017-03-02 ENCOUNTER — Encounter: Payer: Self-pay | Admitting: Adult Health

## 2017-03-05 ENCOUNTER — Telehealth (INDEPENDENT_AMBULATORY_CARE_PROVIDER_SITE_OTHER): Payer: Self-pay

## 2017-03-05 NOTE — Telephone Encounter (Signed)
Patient left vm requesting another right hip injection. Says he had great relief with one he had on 01/22/17. Will be out of town from [place]. And is hoping to get another one before he leaves. Ok to repeat?

## 2017-03-08 NOTE — Telephone Encounter (Signed)
Spoke with pt and scheduled him for 04/08/17 @ 2:15

## 2017-03-08 NOTE — Telephone Encounter (Signed)
Ok x1 but would need Xu follow up after

## 2017-03-09 ENCOUNTER — Encounter: Payer: Self-pay | Admitting: Adult Health

## 2017-03-09 ENCOUNTER — Ambulatory Visit (INDEPENDENT_AMBULATORY_CARE_PROVIDER_SITE_OTHER): Payer: Medicare Other | Admitting: Adult Health

## 2017-03-09 VITALS — BP 144/58 | Temp 97.7°F | Wt 213.2 lb

## 2017-03-09 DIAGNOSIS — E119 Type 2 diabetes mellitus without complications: Secondary | ICD-10-CM | POA: Diagnosis not present

## 2017-03-09 DIAGNOSIS — Z23 Encounter for immunization: Secondary | ICD-10-CM | POA: Diagnosis not present

## 2017-03-09 DIAGNOSIS — Z760 Encounter for issue of repeat prescription: Secondary | ICD-10-CM | POA: Diagnosis not present

## 2017-03-09 LAB — HEMOGLOBIN A1C: Hgb A1c MFr Bld: 6.6 % — ABNORMAL HIGH (ref 4.6–6.5)

## 2017-03-09 LAB — BASIC METABOLIC PANEL
BUN: 17 mg/dL (ref 6–23)
CO2: 28 mEq/L (ref 19–32)
Calcium: 9.2 mg/dL (ref 8.4–10.5)
Chloride: 107 mEq/L (ref 96–112)
Creatinine, Ser: 1.02 mg/dL (ref 0.40–1.50)
GFR: 75.12 mL/min (ref >60.00)
Glucose, Bld: 109 mg/dL — ABNORMAL HIGH (ref 70–99)
Potassium: 4.2 mEq/L (ref 3.5–5.1)
Sodium: 141 mEq/L (ref 135–145)

## 2017-03-09 MED ORDER — LORAZEPAM 0.5 MG PO TABS: 0.5000 mg | ORAL_TABLET | Freq: Two times a day (BID) | ORAL | 0 refills | Status: DC

## 2017-03-09 MED ORDER — FLUTICASONE PROPIONATE 50 MCG/ACT NA SUSP: 2.0000 | Freq: Every day | NASAL | 6 refills | Status: DC

## 2017-03-09 MED ORDER — ZOLPIDEM TARTRATE 5 MG PO TABS: ORAL_TABLET | ORAL | 0 refills | Status: DC

## 2017-03-09 NOTE — Patient Instructions (Signed)
Have a great summer in [place]!  I will follow up with you regarding your blood work   Please let me know if you need anything.

## 2017-03-09 NOTE — Progress Notes (Signed)
Subjective:    Patient ID: Ian [name], male    DOB: 1941-05-10, 75 y.o.   MRN: [id number]  HPI  76 year old male who  has a past medical history of Allergy; Apnea, sleep (07/28/07); Carotid artery stenosis; Coronary heart disease (2008); Diabetes mellitus; GERD (gastroesophageal reflux disease); Hyperlipidemia; Hypertension; and Hypospadias.   He is leaving for his summer house in [place] towards the end of the month and would like to have his medications refilled as well as get the Hep A vaccination as there has a been a Hepatitis A outbreak in [place]. He reports that his insurance will cover this vaccination.   He reports that " my blood sugars have been all over the place lately. When I woke up yesterday my blood sugar was 170 and they have been in the 130's-160's on a regular basis". He has not changed his diet and has been taking medications as directed.    Review of Systems See HPI   Past Medical History:  Diagnosis Date  . Allergy   . Apnea, sleep 07/28/07   NPSG [place], AHI 79.9  . Carotid artery stenosis    mild 04/2010, 05/2012  . Coronary heart disease 2008   Cath 2008, 60-70% proximal diagonal 1 stenosis,  RCA 40% stenosis.  nonischemic Lexiscan 03/2011  . Diabetes mellitus    type II  . GERD (gastroesophageal reflux disease)   . Hyperlipidemia   . Hypertension   . Hypospadias     Social History   Social History  . Marital status: Married    Spouse name: N/A  . Number of children: 3  . Years of education: N/A   Occupational History  . Retired Solicitor Retired   Social History Main Topics  . Smoking status: Never Smoker  . Smokeless tobacco: Never Used  . Alcohol use No  . Drug use: No  . Sexual activity: Not on file   Other Topics Concern  . Not on file   Social History Narrative   Married.  Three children from first marriage.  First wife died of ovarian cancer.     Retired, did work for [employer] for 44 years.     Past Surgical  History:  Procedure Laterality Date  . NASAL SINUS SURGERY    . NISSEN FUNDOPLICATION    . TONSILLECTOMY    . VEIN LIGATION AND STRIPPING      Family History  Problem Relation Age of Onset  . Coronary artery disease Father 20    CABG  . Dementia Mother     Allergies  Allergen Reactions  . Iohexol      Desc: pt. states severe reaction, dr. told patient never to have the dye again     Current Outpatient Prescriptions on File Prior to Visit  Medication Sig Dispense Refill  . amLODipine (NORVASC) 5 MG tablet Take 1 tablet (5 mg total) by mouth daily. 90 tablet 3  . aspirin 81 MG tablet Take 1 tablet (81 mg total) by mouth daily. 90 tablet 3  . atorvastatin (LIPITOR) 40 MG tablet Take 1 tablet (40 mg total) by mouth every other day. 90 tablet 3  . Coenzyme Q10 150 MG CAPS Take 1 capsule (150 mg total) by mouth daily. 90 capsule 3  . doxazosin (CARDURA) 2 MG tablet Take 1 tablet (2 mg total) by mouth at bedtime. 90 tablet 3  . fexofenadine (ALLEGRA) 180 MG tablet Take 1 tablet (180 mg total) by mouth daily. [place]  tablet 2  . gabapentin (NEURONTIN) 300 MG capsule Take 1 capsule (300 mg total) by mouth 2 (two) times daily. 180 capsule 3  . glimepiride (AMARYL) 1 MG tablet Take 1 tablet (1 mg total) by mouth 2 (two) times daily. 180 tablet 3  . Glucosamine-Chondroitin 1500-1200 MG/30ML LIQD Take 1 tablet by mouth 2 (two) times daily.    . metFORMIN (GLUCOPHAGE) 500 MG tablet Take 2 tablets with AM meal and 1 tablet with PM meal 270 tablet 3  . mirabegron ER (MYRBETRIQ) 50 MG TB24 tablet Take 1 tablet (50 mg total) by mouth daily. 90 tablet 3  . nebivolol (BYSTOLIC) 2.5 MG tablet Take 2.5 mg by mouth daily.    Marland Kitchen olmesartan (BENICAR) 40 MG tablet Take 1 tablet (40 mg total) by mouth daily. 90 tablet 3  . Omega-3 Fatty Acids (FISH OIL) 1200 MG CAPS Take 1 capsule (1,200 mg total) by mouth daily. 90 capsule 3  . traMADol (ULTRAM) 50 MG tablet Take 1 tablet (50 mg total) by mouth every 8 (eight)  hours as needed. 90 tablet 0  . Olopatadine HCl 0.6 % SOLN Two sprays into each nostril twice per day (Patient not taking: Reported on 03/09/2017) 30.5 g 6   No current facility-administered medications on file prior to visit.     BP (!) 144/58 (BP Location: Left Arm, Patient Position: Sitting, Cuff Size: Normal)   Temp 97.7 F (36.5 C) (Oral)   Wt 213 lb 3.2 oz (96.7 kg)   BMI 33.39 kg/m       Objective:   Physical Exam  Constitutional: He is oriented to person, place, and time. He appears well-developed and well-nourished. No distress.  Overweight    Cardiovascular: Normal rate, regular rhythm, normal heart sounds and intact distal pulses.  Exam reveals no gallop and no friction rub.   No murmur heard. Pulmonary/Chest: Effort normal and breath sounds normal. No respiratory distress. He has no wheezes. He has no rales. He exhibits no tenderness.  Musculoskeletal: Normal range of motion. He exhibits no edema, tenderness or deformity.  Neurological: He is alert and oriented to person, place, and time. He has normal reflexes. He displays normal reflexes. No cranial nerve deficit. He exhibits normal muscle tone. Coordination normal.  Skin: Skin is warm and dry. No rash noted. He is not diaphoretic. No erythema. No pallor.  Psychiatric: He has a normal mood and affect. His behavior is normal. Judgment and thought content normal.  Nursing note and vitals reviewed.     Assessment & Plan:  1. Diabetes mellitus without complication ([place]) - Consider increasing metformin  - Basic metabolic panel - Hemoglobin A1c  2. Need for prophylactic vaccination and inoculation against viral hepatitis  - Hepatitis A vaccine adult IM  3. Medication refill  - LORazepam (ATIVAN) 0.5 MG tablet; Take 1 tablet (0.5 mg total) by mouth 2 (two) times daily.  Dispense: 60 tablet; Refill: 0 - zolpidem (AMBIEN) 5 MG tablet; TAKE 1 TABLET BY MOUTH AT BEDTIME AS NEEDED FOR SLEEP  Dispense: 30 tablet; Refill:  0 - fluticasone (FLONASE) 50 MCG/ACT nasal spray; Place 2 sprays into both nostrils daily.  Dispense: 16 g; Refill: 6  Dorothyann Peng, NP

## 2017-03-10 MED ORDER — MIRABEGRON ER 50 MG PO TB24: 50.0000 mg | ORAL_TABLET | Freq: Every day | ORAL | 3 refills | Status: DC

## 2017-03-10 MED ORDER — METFORMIN HCL 500 MG PO TABS: ORAL_TABLET | ORAL | 3 refills | Status: DC

## 2017-03-10 MED ORDER — OLMESARTAN MEDOXOMIL 40 MG PO TABS: 40.0000 mg | ORAL_TABLET | Freq: Every day | ORAL | 3 refills | Status: DC

## 2017-03-10 MED ORDER — GABAPENTIN 300 MG PO CAPS: 300.0000 mg | ORAL_CAPSULE | Freq: Two times a day (BID) | ORAL | 3 refills | Status: DC

## 2017-03-10 MED ORDER — GLIMEPIRIDE 1 MG PO TABS: 1.0000 mg | ORAL_TABLET | Freq: Two times a day (BID) | ORAL | 3 refills | Status: DC

## 2017-03-10 MED ORDER — AMLODIPINE BESYLATE 5 MG PO TABS: 5.0000 mg | ORAL_TABLET | Freq: Every day | ORAL | 3 refills | Status: DC

## 2017-03-10 MED ORDER — NEBIVOLOL HCL 2.5 MG PO TABS: 2.5000 mg | ORAL_TABLET | Freq: Every day | ORAL | 3 refills | Status: DC

## 2017-03-10 MED ORDER — ATORVASTATIN CALCIUM 40 MG PO TABS: 40.0000 mg | ORAL_TABLET | ORAL | 3 refills | Status: DC

## 2017-03-10 NOTE — Addendum Note (Signed)
Addended by: Sandria Bales B on: 03/10/2017 09:44 AM   Modules accepted: Orders

## 2017-03-15 ENCOUNTER — Other Ambulatory Visit: Payer: Self-pay | Admitting: *Deleted

## 2017-03-15 DIAGNOSIS — Z23 Encounter for immunization: Secondary | ICD-10-CM

## 2017-03-17 ENCOUNTER — Encounter: Payer: Self-pay | Admitting: Adult Health

## 2017-03-18 DIAGNOSIS — H04123 Dry eye syndrome of bilateral lacrimal glands: Secondary | ICD-10-CM | POA: Diagnosis not present

## 2017-03-18 DIAGNOSIS — H02403 Unspecified ptosis of bilateral eyelids: Secondary | ICD-10-CM | POA: Diagnosis not present

## 2017-03-18 DIAGNOSIS — H40013 Open angle with borderline findings, low risk, bilateral: Secondary | ICD-10-CM | POA: Diagnosis not present

## 2017-03-18 DIAGNOSIS — H5702 Anisocoria: Secondary | ICD-10-CM | POA: Diagnosis not present

## 2017-03-21 NOTE — Progress Notes (Signed)
Cardiology Office Note   Date:  03/23/2017   ID:  Ian [name], DOB June 11, 1941, MRN [id number]  PCP:  Dorothyann Peng, NP  Cardiologist:   Minus Breeding, MD  Referring:  Dorothyann Peng, NP  Chief Complaint  Patient presents with  . Coronary Artery Disease     History of Present Illness: Ian [name] is a 76 y.o. male who presents for follow up of CAD.  He had mild branch and non obstructive disease on cath in 2008.  He had a stress echo most recently in 2016.  He was getting most of his care in [place].  I did see him in 2013.  He is now spending most of his time in [place].  However, he is going back to [place] in a coupe of days.  He walks daily.   The patient denies any new symptoms such as chest discomfort, neck or arm discomfort. There has been no new shortness of breath, PND or orthopnea. There have been no reported palpitations, presyncope or syncope.  He tires more easily than he did before but has no acute complaints.    Past Medical History:  Diagnosis Date  . Allergy   . Apnea, sleep 07/28/07   NPSG [place], AHI 79.9  . Carotid artery stenosis    mild 04/2010, 05/2012  . Coronary heart disease 2008   Cath 2008, 60-70% proximal diagonal 1 stenosis,  RCA 40% stenosis.  nonischemic Lexiscan 03/2011  . Diabetes mellitus    type II  . GERD (gastroesophageal reflux disease)   . Hyperlipidemia   . Hypertension   . Hypospadias     Past Surgical History:  Procedure Laterality Date  . NASAL SINUS SURGERY    . NISSEN FUNDOPLICATION    . TONSILLECTOMY    . VEIN LIGATION AND STRIPPING       Current Outpatient Prescriptions  Medication Sig Dispense Refill  . amLODipine (NORVASC) 5 MG tablet Take 1 tablet (5 mg total) by mouth daily. 90 tablet 3  . aspirin 81 MG tablet Take 1 tablet (81 mg total) by mouth daily. 90 tablet 3  . atorvastatin (LIPITOR) 40 MG tablet Take 1 tablet (40 mg total) by mouth daily. 90 tablet 3  . Coenzyme Q10 150 MG CAPS Take 1 capsule (150 mg total)  by mouth daily. 90 capsule 3  . doxazosin (CARDURA) 2 MG tablet Take 1 tablet (2 mg total) by mouth at bedtime. 90 tablet 3  . fluticasone (FLONASE) 50 MCG/ACT nasal spray Place 2 sprays into both nostrils daily. 16 g 6  . gabapentin (NEURONTIN) 300 MG capsule Take 1 capsule (300 mg total) by mouth 2 (two) times daily. 180 capsule 3  . glimepiride (AMARYL) 1 MG tablet Take 1 tablet (1 mg total) by mouth 2 (two) times daily. 180 tablet 3  . LORazepam (ATIVAN) 0.5 MG tablet Take 1 tablet (0.5 mg total) by mouth 2 (two) times daily. 60 tablet 0  . metFORMIN (GLUCOPHAGE) 500 MG tablet Take 2 tablets with AM meal and 1 tablet with PM meal 270 tablet 3  . mirabegron ER (MYRBETRIQ) 50 MG TB24 tablet Take 1 tablet (50 mg total) by mouth daily. 90 tablet 3  . nebivolol (BYSTOLIC) 2.5 MG tablet Take 1 tablet (2.5 mg total) by mouth daily. 90 tablet 3  . olmesartan (BENICAR) 40 MG tablet Take 1 tablet (40 mg total) by mouth daily. 90 tablet 3  . Omega-3 Fatty Acids (FISH OIL) 1200 MG CAPS Take 1  capsule (1,200 mg total) by mouth daily. 90 capsule 3  . zolpidem (AMBIEN) 5 MG tablet TAKE 1 TABLET BY MOUTH AT BEDTIME AS NEEDED FOR SLEEP 30 tablet 0   No current facility-administered medications for this visit.     Allergies:   Iohexol    ROS:  Please see the history of present illness.   Otherwise, review of systems are positive for none.   All other systems are reviewed and negative.    PHYSICAL EXAM: VS:  BP 112/68   Pulse (!) 51   Ht 5\' 7"  (1.702 m)   Wt 211 lb 3.2 oz (95.8 kg)   BMI 33.08 kg/m  , BMI Body mass index is 33.08 kg/m.  GENERAL:  Well appearing NECK:  No jugular venous distention, waveform within normal limits, carotid upstroke brisk and symmetric, no bruits, no thyromegaly LUNGS:  Clear to auscultation bilaterally BACK:  No CVA tenderness CHEST:  Unremarkable HEART:  PMI not displaced or sustained,S1 and S2 within normal limits, no S3, no S4, no clicks, no rubs, no murmurs ABD:   Flat, positive bowel sounds normal in frequency in pitch, no bruits, no rebound, no guarding, no midline pulsatile mass, no hepatomegaly, no splenomegaly EXT:  2 plus pulses throughout, no edema, no cyanosis no clubbing   EKG:  EKG is not ordered today.   Recent Labs: 12/03/2016: ALT 11; Hemoglobin 13.6; Platelets 191.0; TSH 0.86 03/09/2017: BUN 17; Creatinine, Ser 1.02; Potassium 4.2; Sodium 141    Lipid Panel    Component Value Date/Time   CHOL 184 12/03/2016 1027   TRIG 114.0 12/03/2016 1027   HDL 43.50 12/03/2016 1027   CHOLHDL 4 12/03/2016 1027   VLDL 22.8 12/03/2016 1027   LDLCALC 118 (H) 12/03/2016 1027     Lab Results  Component Value Date   CREATININE 1.02 03/09/2017    Wt Readings from Last 3 Encounters:  03/22/17 211 lb 3.2 oz (95.8 kg)  03/09/17 213 lb 3.2 oz (96.7 kg)  12/23/16 211 lb 12.8 oz (96.1 kg)      Other studies Reviewed: Additional studies/ records that were reviewed today include: Labs Review of the above records demonstrates:   As above   ASSESSMENT AND PLAN:  CAD:   The patient had nonobstructive disease. He had a negative stress test in 2016.  He has no new symptoms.  No change in therapy.   CAROTID STENOSIS:    This was mild and I will repeat a carotid Doppler when he comes back in six months.  HTN:   The blood pressure is at target. No change in medications is indicated. We will continue with therapeutic lifestyle changes (TLC).  DYSLIPIDEMIA:  His LDL is not at target and he will increase his Lipitor to 80 mg daily.  DM:  His A1c was 6.4 most recently. Continue the meds as listed.  CKD:   Of note he did have renal insufficiency when he had his catheterization in the past.  However, creat as above was normal.   OBESITY:   The patient understands the need to lose weight with diet and exercise. We have discussed specific strategies for this.    Current medicines are reviewed at length with the patient today.  The patient does not have  concerns regarding medicines.  The following changes have been made:  As above  Labs/ tests ordered today include: None  No orders of the defined types were placed in this encounter.    Disposition:   FU with  me in six months.     Signed, Minus Breeding, MD  03/23/2017 11:57 AM    [facility]

## 2017-03-22 ENCOUNTER — Encounter: Payer: Self-pay | Admitting: Cardiology

## 2017-03-22 ENCOUNTER — Ambulatory Visit (INDEPENDENT_AMBULATORY_CARE_PROVIDER_SITE_OTHER): Payer: Medicare Other | Admitting: Cardiology

## 2017-03-22 VITALS — BP 112/68 | HR 51 | Ht 67.0 in | Wt 211.2 lb

## 2017-03-22 DIAGNOSIS — E785 Hyperlipidemia, unspecified: Secondary | ICD-10-CM | POA: Diagnosis not present

## 2017-03-22 DIAGNOSIS — I6523 Occlusion and stenosis of bilateral carotid arteries: Secondary | ICD-10-CM

## 2017-03-22 DIAGNOSIS — I2510 Atherosclerotic heart disease of native coronary artery without angina pectoris: Secondary | ICD-10-CM

## 2017-03-22 DIAGNOSIS — E118 Type 2 diabetes mellitus with unspecified complications: Secondary | ICD-10-CM | POA: Diagnosis not present

## 2017-03-22 MED ORDER — ATORVASTATIN CALCIUM 40 MG PO TABS: 40.0000 mg | ORAL_TABLET | Freq: Every day | ORAL | 3 refills | Status: DC

## 2017-03-22 NOTE — Patient Instructions (Addendum)
    Medication Instructions:   INCREASE your atorvastatin (lipitor) use to DAILY. The dosage (40mg ) will stay unchanged.  Labwork: none   Testing/Procedures:  Your physician has requested that you have a carotid duplex. This test is an ultrasound of the carotid arteries in your neck. It looks at blood flow through these arteries that supply the brain with blood. Allow one hour for this exam. There are no restrictions or special instructions.    Follow-Up: Your physician recommends that you schedule a follow-up appointment in: 6 months.    If you need a refill on your cardiac medications before your next appointment, please call your pharmacy.

## 2017-03-23 ENCOUNTER — Encounter: Payer: Self-pay | Admitting: Cardiology

## 2017-03-23 DIAGNOSIS — E785 Hyperlipidemia, unspecified: Secondary | ICD-10-CM | POA: Insufficient documentation

## 2017-03-23 DIAGNOSIS — I6523 Occlusion and stenosis of bilateral carotid arteries: Secondary | ICD-10-CM | POA: Insufficient documentation

## 2017-03-23 DIAGNOSIS — I2510 Atherosclerotic heart disease of native coronary artery without angina pectoris: Secondary | ICD-10-CM | POA: Insufficient documentation

## 2017-04-01 NOTE — Addendum Note (Signed)
Addended by: Sandria Bales B on: 04/01/2017 04:44 PM   Modules accepted: Orders

## 2017-04-04 ENCOUNTER — Encounter: Payer: Self-pay | Admitting: Adult Health

## 2017-04-08 ENCOUNTER — Ambulatory Visit (INDEPENDENT_AMBULATORY_CARE_PROVIDER_SITE_OTHER): Payer: Medicare Other | Admitting: Physical Medicine and Rehabilitation

## 2017-04-08 ENCOUNTER — Encounter: Payer: Self-pay | Admitting: Adult Health

## 2017-04-08 ENCOUNTER — Ambulatory Visit (INDEPENDENT_AMBULATORY_CARE_PROVIDER_SITE_OTHER): Payer: Medicare Other

## 2017-04-08 ENCOUNTER — Encounter (INDEPENDENT_AMBULATORY_CARE_PROVIDER_SITE_OTHER): Payer: Self-pay | Admitting: Physical Medicine and Rehabilitation

## 2017-04-08 VITALS — BP 130/66 | HR 48

## 2017-04-08 DIAGNOSIS — M25551 Pain in right hip: Secondary | ICD-10-CM | POA: Diagnosis not present

## 2017-04-08 MED ORDER — TRIAMCINOLONE ACETONIDE 40 MG/ML IJ SUSP
80.0000 mg | INTRAMUSCULAR | Status: AC | PRN
  Administered 2017-04-08: 80 mg via INTRA_ARTICULAR

## 2017-04-08 MED ORDER — BUPIVACAINE HCL 0.5 % IJ SOLN
3.0000 mL | INTRAMUSCULAR | Status: AC | PRN
  Administered 2017-04-08: 3 mL via INTRA_ARTICULAR

## 2017-04-08 NOTE — Patient Instructions (Signed)

## 2017-04-08 NOTE — Progress Notes (Deleted)
Right hip pain. Denies groin pain. States he did well with last injection. Gradual increase in pain for the last several weeks. Still not as bad as before the last injection.  Going on a trip and will be gone for 3 months or more so was wanting to get another injection before he left.

## 2017-04-08 NOTE — Progress Notes (Signed)
WISSAM RESOR - 76 y.o. male MRN [id number]  Date of birth: Mar 26, 1941  Office Visit Note: Visit Date: 04/08/2017 PCP: Dorothyann Peng, NP Referred by: Dorothyann Peng, NP  Subjective: Chief Complaint  Patient presents with  . Right Hip - Pain   HPI: Mr. Deleonardis is a very pleasant 76 year old gentleman who is followed by Dr. Wyline Copas in our office for right hip pain. We did complete a diagnostic anesthetic arthrogram in the middle of March and he did extremely well and says his pain level is dramatically been decreased but it started to increase lately over the last several weeks. His main pain now is at night. He is walking better. He continues to have now worsening hip and groin pain and stiffness. He has had no new trauma or falls. He is leaving to go to [place] where he has a cabin in [facility]. He did want to see if an injection would help him through that time and I think that is a good time for the injection since it did help. Depending on the length of time that the injection worked to last and his symptoms flare back up to have him follow up with Dr. Erlinda Hong. If it's quite a while because also dislocated reinjecting if it sometime in the fall. He did ask today about a refill of tramadol. His last tramadol prescription was by Dorothyann Peng, PA.  He requests a refill with a refill so that he can have this done in [place]. I told him I would send a note to Mr. Nafziger to look into getting that prescription for him. I think at this point since I've only seen him for the injection and they know his medical history and background better than I do would be more appropriate for the refill to come from that office.    ROS Otherwise per HPI.  Assessment & Plan: Visit Diagnoses:  1. Pain in right hip     Plan: Findings:  Right hip injection with fluoroscopic guidance. There was excellent flow of contrast as noted below.    Meds & Orders: No orders of the defined types were placed in this  encounter.   Orders Placed This Encounter  Procedures  . Large Joint Injection/Arthrocentesis  . XR C-ARM NO REPORT    Follow-up: No Follow-up on file.   Procedures: Large Joint Inj Date/Time: 04/08/2017 2:31 PM Performed by: Magnus Sinning Authorized by: Magnus Sinning   Consent Given by:  Patient Site marked: the procedure site was marked   Timeout: prior to procedure the correct patient, procedure, and site was verified   Indications:  Pain and diagnostic evaluation Location:  Hip Site:  R hip joint Prep: patient was prepped and draped in usual sterile fashion   Needle Size:  22 G Approach:  Anterior Ultrasound Guidance: No   Fluoroscopic Guidance: No   Arthrogram: Yes   Medications:  80 mg triamcinolone acetonide 40 MG/ML; 3 mL bupivacaine 0.5 % Aspiration Attempted: Yes   Patient tolerance:  Patient tolerated the procedure well with no immediate complications  Arthrogram demonstrated excellent flow of contrast throughout the joint surface without extravasation or obvious defect.  The patient had relief of symptoms during the anesthetic phase of the injection.     No notes on file   Clinical History: No specialty comments available.  He reports that he has never smoked. He has never used smokeless tobacco.   Recent Labs  09/15/16 1056 12/03/16 1027 03/09/17 0934  HGBA1C 6.2 5.9  6.6*    Objective:  VS:  HT:    WT:   BMI:     BP:130/66  HR:(!) 48bpm  TEMP: ( )  RESP:  Physical Exam  Musculoskeletal:  Patient ambulates without aid with a fairly normal gait. He does have some stiffness with hip rotation.    Ortho Exam Imaging: No results found.  Past Medical/Family/Surgical/Social History: Medications & Allergies reviewed per EMR Patient Active Problem List   Diagnosis Date Noted  . Coronary artery disease involving native coronary artery of native heart without angina pectoris 03/23/2017  . Bilateral carotid artery stenosis 03/23/2017  .  Dyslipidemia 03/23/2017  . Type 2 diabetes mellitus with complication, without long-term current use of insulin ([place]) 03/23/2017  . Encounter to establish care 04/05/2015  . IBS (irritable bowel syndrome) 12/12/2014  . Erectile dysfunction 01/19/2014  . Diabetic polyneuropathy ([place]) 11/20/2013  . Shortness of breath 09/26/2012  . Chronic insomnia 03/25/2012  . PALPITATIONS 11/19/2010  . URINARY INCONTINENCE, PASSIVE, CONTINUOUS LEAKAGE 11/19/2010  . Disorder resulting from impaired renal function 11/13/2009  . VARICOSE VEINS, LOWER EXTREMITIES 09/17/2009  . BPH with urinary obstruction 09/17/2009  . RESTLESS LEG SYNDROME 02/12/2009  . HIP PAIN, RIGHT 02/12/2009  . DIAPHRAGMATIC DISORDER 10/26/2008  . Diabetes mellitus without complication ([place]) 25/00/3704  . Hyperlipidemia 01/08/2008  . Essential hypertension 01/08/2008  . Coronary atherosclerosis 01/08/2008  . ALLERGIC RHINITIS 01/08/2008  . SLEEP APNEA 01/08/2008   Past Medical History:  Diagnosis Date  . Allergy   . Apnea, sleep 07/28/07   NPSG [place], AHI 79.9  . Carotid artery stenosis    mild 04/2010, 05/2012  . Coronary heart disease 2008   Cath 2008, 60-70% proximal diagonal 1 stenosis,  RCA 40% stenosis.  nonischemic Lexiscan 03/2011  . Diabetes mellitus    type II  . GERD (gastroesophageal reflux disease)   . Hyperlipidemia   . Hypertension   . Hypospadias    Family History  Problem Relation Age of Onset  . Coronary artery disease Father 64       CABG  . Dementia Mother    Past Surgical History:  Procedure Laterality Date  . NASAL SINUS SURGERY    . NISSEN FUNDOPLICATION    . TONSILLECTOMY    . VEIN LIGATION AND STRIPPING     Social History   Occupational History  . Retired Solicitor Retired   Social History Main Topics  . Smoking status: Never Smoker  . Smokeless tobacco: Never Used  . Alcohol use No  . Drug use: No  . Sexual activity: Not on file

## 2017-04-09 ENCOUNTER — Other Ambulatory Visit: Payer: Self-pay | Admitting: Adult Health

## 2017-04-09 MED ORDER — TRAMADOL HCL 50 MG PO TABS: 50.0000 mg | ORAL_TABLET | Freq: Three times a day (TID) | ORAL | 0 refills | Status: DC

## 2017-04-28 DIAGNOSIS — E782 Mixed hyperlipidemia: Secondary | ICD-10-CM | POA: Diagnosis not present

## 2017-04-28 DIAGNOSIS — I2510 Atherosclerotic heart disease of native coronary artery without angina pectoris: Secondary | ICD-10-CM | POA: Diagnosis not present

## 2017-04-28 DIAGNOSIS — I739 Peripheral vascular disease, unspecified: Secondary | ICD-10-CM | POA: Diagnosis not present

## 2017-04-28 DIAGNOSIS — I10 Essential (primary) hypertension: Secondary | ICD-10-CM | POA: Diagnosis not present

## 2017-04-28 DIAGNOSIS — G4733 Obstructive sleep apnea (adult) (pediatric): Secondary | ICD-10-CM | POA: Diagnosis not present

## 2017-05-20 DIAGNOSIS — R238 Other skin changes: Secondary | ICD-10-CM | POA: Diagnosis not present

## 2017-05-20 DIAGNOSIS — R252 Cramp and spasm: Secondary | ICD-10-CM | POA: Diagnosis not present

## 2017-05-20 DIAGNOSIS — R233 Spontaneous ecchymoses: Secondary | ICD-10-CM | POA: Diagnosis not present

## 2017-05-29 ENCOUNTER — Encounter: Payer: Self-pay | Admitting: Adult Health

## 2017-05-31 MED ORDER — TRAMADOL HCL 50 MG PO TABS: 50.0000 mg | ORAL_TABLET | Freq: Three times a day (TID) | ORAL | 0 refills | Status: DC

## 2017-06-02 ENCOUNTER — Encounter: Payer: Self-pay | Admitting: Adult Health

## 2017-06-16 ENCOUNTER — Encounter: Payer: Self-pay | Admitting: Adult Health

## 2017-06-17 DIAGNOSIS — Z0000 Encounter for general adult medical examination without abnormal findings: Secondary | ICD-10-CM | POA: Diagnosis not present

## 2017-06-17 DIAGNOSIS — E785 Hyperlipidemia, unspecified: Secondary | ICD-10-CM | POA: Diagnosis not present

## 2017-06-17 DIAGNOSIS — E119 Type 2 diabetes mellitus without complications: Secondary | ICD-10-CM | POA: Diagnosis not present

## 2017-06-17 DIAGNOSIS — L989 Disorder of the skin and subcutaneous tissue, unspecified: Secondary | ICD-10-CM | POA: Diagnosis not present

## 2017-06-17 DIAGNOSIS — Z125 Encounter for screening for malignant neoplasm of prostate: Secondary | ICD-10-CM | POA: Diagnosis not present

## 2017-06-17 DIAGNOSIS — I10 Essential (primary) hypertension: Secondary | ICD-10-CM | POA: Diagnosis not present

## 2017-07-17 ENCOUNTER — Encounter: Payer: Self-pay | Admitting: Adult Health

## 2017-07-20 ENCOUNTER — Other Ambulatory Visit: Payer: Self-pay | Admitting: Adult Health

## 2017-07-20 ENCOUNTER — Other Ambulatory Visit: Payer: Self-pay | Admitting: *Deleted

## 2017-07-20 MED ORDER — ZOLPIDEM TARTRATE ER 6.25 MG PO TBCR: 6.2500 mg | EXTENDED_RELEASE_TABLET | Freq: Every evening | ORAL | 0 refills | Status: DC | PRN

## 2017-07-30 ENCOUNTER — Encounter: Payer: Self-pay | Admitting: Adult Health

## 2017-08-14 DIAGNOSIS — Z23 Encounter for immunization: Secondary | ICD-10-CM | POA: Diagnosis not present

## 2017-08-18 ENCOUNTER — Encounter: Payer: Self-pay | Admitting: Adult Health

## 2017-08-19 DIAGNOSIS — M545 Low back pain: Secondary | ICD-10-CM | POA: Diagnosis not present

## 2017-08-25 DIAGNOSIS — M4056 Lordosis, unspecified, lumbar region: Secondary | ICD-10-CM | POA: Diagnosis not present

## 2017-08-25 DIAGNOSIS — M545 Low back pain: Secondary | ICD-10-CM | POA: Diagnosis not present

## 2017-08-25 DIAGNOSIS — M47816 Spondylosis without myelopathy or radiculopathy, lumbar region: Secondary | ICD-10-CM | POA: Diagnosis not present

## 2017-09-06 ENCOUNTER — Encounter: Payer: Self-pay | Admitting: Adult Health

## 2017-09-07 NOTE — Telephone Encounter (Signed)
Spoke with pt and relayed message that he needs to make an appt. Pt states he is not sure when he will return to [place]. Hopefully soon and will call when he gets home

## 2017-09-13 DIAGNOSIS — E113291 Type 2 diabetes mellitus with mild nonproliferative diabetic retinopathy without macular edema, right eye: Secondary | ICD-10-CM | POA: Diagnosis not present

## 2017-09-13 DIAGNOSIS — H2512 Age-related nuclear cataract, left eye: Secondary | ICD-10-CM | POA: Diagnosis not present

## 2017-09-13 DIAGNOSIS — H40013 Open angle with borderline findings, low risk, bilateral: Secondary | ICD-10-CM | POA: Diagnosis not present

## 2017-09-13 DIAGNOSIS — H25013 Cortical age-related cataract, bilateral: Secondary | ICD-10-CM | POA: Diagnosis not present

## 2017-09-13 DIAGNOSIS — H2513 Age-related nuclear cataract, bilateral: Secondary | ICD-10-CM | POA: Diagnosis not present

## 2017-09-13 LAB — HM DIABETES EYE EXAM

## 2017-09-14 ENCOUNTER — Encounter: Payer: Self-pay | Admitting: Adult Health

## 2017-09-14 ENCOUNTER — Ambulatory Visit (INDEPENDENT_AMBULATORY_CARE_PROVIDER_SITE_OTHER): Payer: Medicare Other | Admitting: Adult Health

## 2017-09-14 VITALS — BP 144/60 | Temp 98.0°F | Ht 67.0 in | Wt 199.0 lb

## 2017-09-14 DIAGNOSIS — E119 Type 2 diabetes mellitus without complications: Secondary | ICD-10-CM | POA: Diagnosis not present

## 2017-09-14 DIAGNOSIS — R1084 Generalized abdominal pain: Secondary | ICD-10-CM

## 2017-09-14 DIAGNOSIS — I6523 Occlusion and stenosis of bilateral carotid arteries: Secondary | ICD-10-CM | POA: Diagnosis not present

## 2017-09-14 MED ORDER — PANTOPRAZOLE SODIUM 40 MG PO TBEC: 40.0000 mg | DELAYED_RELEASE_TABLET | Freq: Every day | ORAL | 3 refills | Status: DC

## 2017-09-14 MED ORDER — SUCRALFATE 1 GM/10ML PO SUSP: 1.0000 g | Freq: Three times a day (TID) | ORAL | 3 refills | Status: DC

## 2017-09-14 NOTE — Progress Notes (Signed)
Subjective:    Patient ID: Ian [name], male    DOB: 1941/09/27, 76 y.o.   MRN: [id number]  HPI   76 year old male who  has a past medical history of Allergy, Apnea, sleep (07/28/07), Carotid artery stenosis, Coronary heart disease (2008), Diabetes mellitus, GERD (gastroesophageal reflux disease), Hyperlipidemia, Hypertension, and Hypospadias.  He recently returned from [place], where he spends the summer at his cabin. Over the last 4-6 weeks he has been experiencing pretty constant abdominal pain ( points to the epigastric area), loss of weight,loss of interest with eating, and episodes of nausea with out vomiting. In addition to this he often feels weak and out of of energy. He does feel like he has been burping more often   He denies any fevers, rashes, tick bites, chest pain, shortness of breath, or diarrhea.   Wt Readings from Last 3 Encounters:  09/14/17 199 lb (90.3 kg)  03/22/17 211 lb 3.2 oz (95.8 kg)  03/09/17 213 lb 3.2 oz (96.7 kg)     Review of Systems See HPI   Past Medical History:  Diagnosis Date  . Allergy   . Apnea, sleep 07/28/07   NPSG [place], AHI 79.9  . Carotid artery stenosis    mild 04/2010, 05/2012  . Coronary heart disease 2008   Cath 2008, 60-70% proximal diagonal 1 stenosis,  RCA 40% stenosis.  nonischemic Lexiscan 03/2011  . Diabetes mellitus    type II  . GERD (gastroesophageal reflux disease)   . Hyperlipidemia   . Hypertension   . Hypospadias     Social History   Socioeconomic History  . Marital status: Married    Spouse name: Not on file  . Number of children: 3  . Years of education: Not on file  . Highest education level: Not on file  Social Needs  . Financial resource strain: Not on file  . Food insecurity - worry: Not on file  . Food insecurity - inability: Not on file  . Transportation needs - medical: Not on file  . Transportation needs - non-medical: Not on file  Occupational History  . Occupation: Retired Therapist, occupational: RETIRED  Tobacco Use  . Smoking status: Never Smoker  . Smokeless tobacco: Never Used  Substance and Sexual Activity  . Alcohol use: No    Alcohol/week: 0.0 oz  . Drug use: No  . Sexual activity: Not on file  Other Topics Concern  . Not on file  Social History Narrative   Married.  Three children from first marriage.  First wife died of ovarian cancer.     Retired, did work for [employer] for 44 years.     Past Surgical History:  Procedure Laterality Date  . NASAL SINUS SURGERY    . NISSEN FUNDOPLICATION    . TONSILLECTOMY    . VEIN LIGATION AND STRIPPING      Family History  Problem Relation Age of Onset  . Coronary artery disease Father 70       CABG  . Dementia Mother     Allergies  Allergen Reactions  . Iohexol      Desc: pt. states severe reaction, dr. told patient never to have the dye again     Current Outpatient Medications on File Prior to Visit  Medication Sig Dispense Refill  . amLODipine (NORVASC) 5 MG tablet Take 1 tablet (5 mg total) by mouth daily. 90 tablet 3  . aspirin 81 MG tablet Take 1  tablet (81 mg total) by mouth daily. 90 tablet 3  . atorvastatin (LIPITOR) 40 MG tablet Take 1 tablet (40 mg total) by mouth daily. 90 tablet 3  . Coenzyme Q10 150 MG CAPS Take 1 capsule (150 mg total) by mouth daily. 90 capsule 3  . doxazosin (CARDURA) 2 MG tablet Take 1 tablet (2 mg total) by mouth at bedtime. 90 tablet 3  . gabapentin (NEURONTIN) 300 MG capsule Take 1 capsule (300 mg total) by mouth 2 (two) times daily. 180 capsule 3  . glimepiride (AMARYL) 1 MG tablet Take 1 tablet (1 mg total) by mouth 2 (two) times daily. 180 tablet 3  . LORazepam (ATIVAN) 0.5 MG tablet Take 1 tablet (0.5 mg total) by mouth 2 (two) times daily. 60 tablet 0  . metFORMIN (GLUCOPHAGE) 500 MG tablet Take 2 tablets with AM meal and 1 tablet with PM meal 270 tablet 3  . nebivolol (BYSTOLIC) 2.5 MG tablet Take 1 tablet (2.5 mg total) by mouth daily. 90 tablet 3  .  olmesartan (BENICAR) 40 MG tablet Take 1 tablet (40 mg total) by mouth daily. 90 tablet 3  . Omega-3 Fatty Acids (FISH OIL) 1200 MG CAPS Take 1 capsule (1,200 mg total) by mouth daily. 90 capsule 3  . traMADol (ULTRAM) 50 MG tablet Take 1 tablet (50 mg total) by mouth 3 (three) times daily. 90 tablet 0  . zolpidem (AMBIEN CR) 6.25 MG CR tablet Take 1 tablet (6.25 mg total) by mouth at bedtime as needed for sleep. 30 tablet 0  . mirabegron ER (MYRBETRIQ) 50 MG TB24 tablet Take 1 tablet (50 mg total) by mouth daily. (Patient not taking: Reported on 09/14/2017) 90 tablet 3   No current facility-administered medications on file prior to visit.     BP (!) 144/60 (BP Location: Left Arm)   Temp 98 F (36.7 C) (Oral)   Ht 5\' 7"  (1.702 m)   Wt 199 lb (90.3 kg)   BMI 31.17 kg/m       Objective:   Physical Exam  Constitutional: He is oriented to person, place, and time. He appears well-developed and well-nourished. He appears ill. No distress.  HENT:  Head: Normocephalic and atraumatic.  Right Ear: External ear normal.  Left Ear: External ear normal.  Nose: Nose normal.  Mouth/Throat: Oropharynx is clear and moist.  Cardiovascular: Normal rate, regular rhythm, normal heart sounds and intact distal pulses. Exam reveals no gallop and no friction rub.  No murmur heard. Pulmonary/Chest: Effort normal and breath sounds normal. No respiratory distress. He has no wheezes. He has no rales. He exhibits no tenderness.  Abdominal: Soft. Normal appearance and bowel sounds are normal. He exhibits no distension and no mass. There is tenderness in the epigastric area. There is no rebound and no guarding.  Neurological: He is alert and oriented to person, place, and time.  Skin: Skin is warm and dry. No rash noted. He is not diaphoretic. No erythema. No pallor.  Psychiatric: He has a normal mood and affect. His behavior is normal. Judgment and thought content normal.  Nursing note and vitals reviewed.       Assessment & Plan:  1. Generalized abdominal pain - Appears more GERD or PUD. Need to r/o h.pylori infection. Will send in protonix and carafate. I would like him to follow up in 2 weeks or sooner if needed - H. pylori antibody, IgG - pantoprazole (PROTONIX) 40 MG tablet; Take 1 tablet (40 mg total) daily by mouth.  Dispense: 30 tablet; Refill: 3 - sucralfate (CARAFATE) 1 GM/10ML suspension; Take 10 mLs (1 g total) 4 (four) times daily -  with meals and at bedtime by mouth.  Dispense: 420 mL; Refill: 3 - CBC with Differential/Platelet - Basic Metabolic Panel - Hemoglobin A1c  2. Diabetes mellitus without complication ([place])  - Hemoglobin A1c  Dorothyann Peng, NP

## 2017-09-15 ENCOUNTER — Other Ambulatory Visit: Payer: Self-pay | Admitting: Adult Health

## 2017-09-15 ENCOUNTER — Encounter: Payer: Self-pay | Admitting: Adult Health

## 2017-09-15 ENCOUNTER — Encounter: Payer: Self-pay | Admitting: Family Medicine

## 2017-09-15 LAB — BASIC METABOLIC PANEL WITH GFR
BUN: 19 mg/dL (ref 6–23)
CO2: 23 meq/L (ref 19–32)
Calcium: 9.4 mg/dL (ref 8.4–10.5)
Chloride: 106 meq/L (ref 96–112)
Creatinine, Ser: 1.04 mg/dL (ref 0.40–1.50)
GFR: 73.35 mL/min
Glucose, Bld: 95 mg/dL (ref 70–99)
Potassium: 4.3 meq/L (ref 3.5–5.1)
Sodium: 140 meq/L (ref 135–145)

## 2017-09-15 LAB — CBC WITH DIFFERENTIAL/PLATELET
Basophils Absolute: 0.1 10*3/uL (ref 0.0–0.1)
Basophils Relative: 1.1 % (ref 0.0–3.0)
Eosinophils Absolute: 0.2 10*3/uL (ref 0.0–0.7)
Eosinophils Relative: 3.1 % (ref 0.0–5.0)
HCT: 42.6 % (ref 39.0–52.0)
Hemoglobin: 14.1 g/dL (ref 13.0–17.0)
Lymphocytes Relative: 22.2 % (ref 12.0–46.0)
Lymphs Abs: 1.5 10*3/uL (ref 0.7–4.0)
MCHC: 33.1 g/dL (ref 30.0–36.0)
MCV: 89.4 fl (ref 78.0–100.0)
Monocytes Absolute: 0.6 10*3/uL (ref 0.1–1.0)
Monocytes Relative: 9.2 % (ref 3.0–12.0)
Neutro Abs: 4.2 10*3/uL (ref 1.4–7.7)
Neutrophils Relative %: 64.4 % (ref 43.0–77.0)
Platelets: 244 10*3/uL (ref 150.0–400.0)
RBC: 4.77 Mil/uL (ref 4.22–5.81)
RDW: 13.4 % (ref 11.5–15.5)
WBC: 6.6 10*3/uL (ref 4.0–10.5)

## 2017-09-15 LAB — HEMOGLOBIN A1C: Hgb A1c MFr Bld: 6.7 % — ABNORMAL HIGH (ref 4.6–6.5)

## 2017-09-15 LAB — H. PYLORI ANTIBODY, IGG: H Pylori IgG: NEGATIVE

## 2017-09-15 MED ORDER — TERBINAFINE HCL 250 MG PO TABS: 250.0000 mg | ORAL_TABLET | Freq: Every day | ORAL | 1 refills | Status: DC

## 2017-09-23 ENCOUNTER — Encounter: Payer: Self-pay | Admitting: Adult Health

## 2017-09-23 ENCOUNTER — Ambulatory Visit (INDEPENDENT_AMBULATORY_CARE_PROVIDER_SITE_OTHER): Payer: Medicare Other | Admitting: Adult Health

## 2017-09-23 VITALS — BP 120/60 | Temp 97.4°F | Wt 200.0 lb

## 2017-09-23 DIAGNOSIS — I6523 Occlusion and stenosis of bilateral carotid arteries: Secondary | ICD-10-CM | POA: Diagnosis not present

## 2017-09-23 DIAGNOSIS — K219 Gastro-esophageal reflux disease without esophagitis: Secondary | ICD-10-CM | POA: Diagnosis not present

## 2017-09-23 DIAGNOSIS — Z23 Encounter for immunization: Secondary | ICD-10-CM

## 2017-09-23 NOTE — Progress Notes (Signed)
Subjective:    Patient ID: Ian [name], male    DOB: [birth year], 76 y.o.   MRN: [id number]  HPI  76 year old male who  has a past medical history of Allergy, Apnea, sleep (07/28/07), Carotid artery stenosis, Coronary heart disease (2008), Diabetes mellitus, GERD (gastroesophageal reflux disease), Hyperlipidemia, Hypertension, and Hypospadias.  He presents to the office today for follow up regarding stomach pain, loss of weight, loss of interest with eating, and episodes of nausea. During his last visit a week ago he was started on Carafate and Protonix. His labs including CBC, BMP, and H.pylori were negative.   Today in the office he reports that he is feeling about 70-80% improved. He still has slight abdominal pain at time but overall he is feeling much better. He also reports that food is starting to taste better and his appetite is coming back.    Review of Systems See HPI   Past Medical History:  Diagnosis Date  . Allergy   . Apnea, sleep 07/28/07   NPSG [place], AHI 79.9  . Carotid artery stenosis    mild 04/2010, 05/2012  . Coronary heart disease 2008   Cath 2008, 60-70% proximal diagonal 1 stenosis,  RCA 40% stenosis.  nonischemic Lexiscan 03/2011  . Diabetes mellitus    type II  . GERD (gastroesophageal reflux disease)   . Hyperlipidemia   . Hypertension   . Hypospadias     Social History   Socioeconomic History  . Marital status: Married    Spouse name: Not on file  . Number of children: 3  . Years of education: Not on file  . Highest education level: Not on file  Social Needs  . Financial resource strain: Not on file  . Food insecurity - worry: Not on file  . Food insecurity - inability: Not on file  . Transportation needs - medical: Not on file  . Transportation needs - non-medical: Not on file  Occupational History  . Occupation: Retired Games developer: RETIRED  Tobacco Use  . Smoking status: Never Smoker  . Smokeless tobacco: Never Used    Substance and Sexual Activity  . Alcohol use: No    Alcohol/week: 0.0 oz  . Drug use: No  . Sexual activity: Not on file  Other Topics Concern  . Not on file  Social History Narrative   Married.  Three children from first marriage.  First wife died of ovarian cancer.     Retired, did work for [employer] for 44 years.     Past Surgical History:  Procedure Laterality Date  . NASAL SINUS SURGERY    . NISSEN FUNDOPLICATION    . TONSILLECTOMY    . VEIN LIGATION AND STRIPPING      Family History  Problem Relation Age of Onset  . Coronary artery disease Father 57       CABG  . Dementia Mother     Allergies  Allergen Reactions  . Iohexol      Desc: pt. states severe reaction, dr. told patient never to have the dye again     Current Outpatient Medications on File Prior to Visit  Medication Sig Dispense Refill  . amLODipine (NORVASC) 5 MG tablet Take 1 tablet (5 mg total) by mouth daily. 90 tablet 3  . aspirin 81 MG tablet Take 1 tablet (81 mg total) by mouth daily. 90 tablet 3  . atorvastatin (LIPITOR) 40 MG tablet Take 1 tablet (40 mg total)  by mouth daily. 90 tablet 3  . Coenzyme Q10 150 MG CAPS Take 1 capsule (150 mg total) by mouth daily. 90 capsule 3  . doxazosin (CARDURA) 2 MG tablet Take 1 tablet (2 mg total) by mouth at bedtime. 90 tablet 3  . gabapentin (NEURONTIN) 300 MG capsule Take 1 capsule (300 mg total) by mouth 2 (two) times daily. 180 capsule 3  . glimepiride (AMARYL) 1 MG tablet Take 1 tablet (1 mg total) by mouth 2 (two) times daily. 180 tablet 3  . LORazepam (ATIVAN) 0.5 MG tablet Take 1 tablet (0.5 mg total) by mouth 2 (two) times daily. 60 tablet 0  . metFORMIN (GLUCOPHAGE) 500 MG tablet Take 2 tablets with AM meal and 1 tablet with PM meal 270 tablet 3  . nebivolol (BYSTOLIC) 2.5 MG tablet Take 1 tablet (2.5 mg total) by mouth daily. 90 tablet 3  . olmesartan (BENICAR) 40 MG tablet Take 1 tablet (40 mg total) by mouth daily. 90 tablet 3  . Omega-3  Fatty Acids (FISH OIL) 1200 MG CAPS Take 1 capsule (1,200 mg total) by mouth daily. 90 capsule 3  . pantoprazole (PROTONIX) 40 MG tablet Take 1 tablet (40 mg total) daily by mouth. 30 tablet 3  . sucralfate (CARAFATE) 1 GM/10ML suspension Take 10 mLs (1 g total) 4 (four) times daily -  with meals and at bedtime by mouth. 420 mL 3  . terbinafine (LAMISIL) 250 MG tablet Take 1 tablet (250 mg total) daily by mouth. 30 tablet 1  . traMADol (ULTRAM) 50 MG tablet Take 1 tablet (50 mg total) by mouth 3 (three) times daily. 90 tablet 0  . zolpidem (AMBIEN CR) 6.25 MG CR tablet Take 1 tablet (6.25 mg total) by mouth at bedtime as needed for sleep. 30 tablet 0  . mirabegron ER (MYRBETRIQ) 50 MG TB24 tablet Take 1 tablet (50 mg total) by mouth daily. (Patient not taking: Reported on 09/14/2017) 90 tablet 3   No current facility-administered medications on file prior to visit.     BP 120/60 (BP Location: Left Arm)   Temp (!) 97.4 F (36.3 C) (Oral)   Wt 200 lb (90.7 kg)   BMI 31.32 kg/m       Objective:   Physical Exam  Constitutional: He is oriented to person, place, and time. He appears well-developed and well-nourished. No distress.  Cardiovascular: Normal rate, regular rhythm, normal heart sounds and intact distal pulses. Exam reveals no gallop and no friction rub.  No murmur heard. Pulmonary/Chest: Effort normal and breath sounds normal. No respiratory distress. He has no wheezes. He has no rales. He exhibits no tenderness.  Abdominal: Soft. Bowel sounds are normal. He exhibits no distension and no mass. There is no tenderness. There is no rebound and no guarding.  Neurological: He is alert and oriented to person, place, and time.  Skin: Skin is warm and dry. No rash noted. He is not diaphoretic. No erythema. No pallor.  Psychiatric: He has a normal mood and affect. His behavior is normal. Judgment and thought content normal.  Nursing note and vitals reviewed.     Assessment & Plan:  1.  Gastroesophageal reflux disease without esophagitis - Continue with Protonix and Carefate. I would like him to Protonix for the next 3 months and then he can stop the medication to see how he does  - Follow up as needed  Dorothyann Peng, NP

## 2017-09-23 NOTE — Addendum Note (Signed)
Addended by: Miles Costain T on: 09/23/2017 02:45 PM   Modules accepted: Orders

## 2017-10-07 DIAGNOSIS — D225 Melanocytic nevi of trunk: Secondary | ICD-10-CM | POA: Diagnosis not present

## 2017-10-11 ENCOUNTER — Other Ambulatory Visit: Payer: Self-pay | Admitting: Gastroenterology

## 2017-10-11 DIAGNOSIS — R634 Abnormal weight loss: Secondary | ICD-10-CM | POA: Diagnosis not present

## 2017-10-11 DIAGNOSIS — R109 Unspecified abdominal pain: Secondary | ICD-10-CM | POA: Diagnosis not present

## 2017-10-18 ENCOUNTER — Other Ambulatory Visit: Payer: Medicare Other

## 2017-10-20 ENCOUNTER — Other Ambulatory Visit: Payer: Medicare Other

## 2017-10-21 ENCOUNTER — Ambulatory Visit
Admission: RE | Admit: 2017-10-21 | Discharge: 2017-10-21 | Disposition: A | Discharge status: Home / Self Care | Payer: Medicare Other | Source: Ambulatory Visit | Attending: Gastroenterology | Admitting: Gastroenterology

## 2017-10-21 DIAGNOSIS — R109 Unspecified abdominal pain: Secondary | ICD-10-CM

## 2017-10-21 DIAGNOSIS — K824 Cholesterolosis of gallbladder: Secondary | ICD-10-CM | POA: Diagnosis not present

## 2017-11-10 ENCOUNTER — Encounter: Payer: Self-pay | Admitting: Adult Health

## 2017-11-11 ENCOUNTER — Encounter: Payer: Self-pay | Admitting: Adult Health

## 2017-11-11 ENCOUNTER — Other Ambulatory Visit: Payer: Self-pay | Admitting: Adult Health

## 2017-11-11 MED ORDER — ZOLPIDEM TARTRATE ER 6.25 MG PO TBCR: 6.2500 mg | EXTENDED_RELEASE_TABLET | Freq: Every evening | ORAL | 0 refills | Status: DC | PRN

## 2017-11-16 DIAGNOSIS — H25812 Combined forms of age-related cataract, left eye: Secondary | ICD-10-CM | POA: Diagnosis not present

## 2017-11-16 DIAGNOSIS — H2512 Age-related nuclear cataract, left eye: Secondary | ICD-10-CM | POA: Diagnosis not present

## 2017-11-22 DIAGNOSIS — K824 Cholesterolosis of gallbladder: Secondary | ICD-10-CM | POA: Diagnosis not present

## 2017-11-22 DIAGNOSIS — K760 Fatty (change of) liver, not elsewhere classified: Secondary | ICD-10-CM | POA: Diagnosis not present

## 2017-11-22 DIAGNOSIS — R109 Unspecified abdominal pain: Secondary | ICD-10-CM | POA: Diagnosis not present

## 2017-12-08 ENCOUNTER — Ambulatory Visit (HOSPITAL_COMMUNITY)
Admission: RE | Admit: 2017-12-08 | Discharge: 2017-12-08 | Disposition: A | Discharge status: Home / Self Care | Payer: Medicare Other | Source: Ambulatory Visit | Attending: Cardiovascular Disease | Admitting: Cardiovascular Disease

## 2017-12-08 DIAGNOSIS — I6523 Occlusion and stenosis of bilateral carotid arteries: Secondary | ICD-10-CM | POA: Diagnosis not present

## 2017-12-10 ENCOUNTER — Other Ambulatory Visit: Payer: Self-pay | Admitting: Adult Health

## 2017-12-17 ENCOUNTER — Encounter: Payer: Self-pay | Admitting: Cardiology

## 2017-12-17 ENCOUNTER — Encounter: Payer: Self-pay | Admitting: Adult Health

## 2017-12-21 ENCOUNTER — Other Ambulatory Visit: Payer: Self-pay | Admitting: Adult Health

## 2018-01-06 NOTE — Progress Notes (Signed)
Cardiology Office Note   Date:  01/07/2018   ID:  Ian [name], DOB 1941-06-28, MRN [id number]  PCP:  Dorothyann Peng, NP  Cardiologist:   Minus Breeding, MD  Referring:  Dorothyann Peng, NP  Chief Complaint  Patient presents with  . Coronary Artery Disease     History of Present Illness: Ian [name] is a 77 y.o. male who presents for follow up of CAD.  He had mild branch and non obstructive disease on cath in 2008.  He had a stress echo most recently in 2016.  He was getting most of his care in [place].   I saw him last in May of last year.  He was doing OK.  Since I last saw him he has been doing okay.  He walks a mile per day.  He goes up a mild hill.  He can get some shortness of breath when he does this but this is chronic.  He denies any chest pressure, neck or arm discomfort.  He is had no new palpitations, presyncope or syncope.  He has had no weight gain or edema.  Past Medical History:  Diagnosis Date  . Allergy   . Apnea, sleep 07/28/07   NPSG [place], AHI 79.9  . Carotid artery stenosis    mild 04/2010, 05/2012  . Coronary heart disease 2008   Cath 2008, 60-70% proximal diagonal 1 stenosis,  RCA 40% stenosis.  nonischemic Lexiscan 03/2011  . Diabetes mellitus    type II  . GERD (gastroesophageal reflux disease)   . Hyperlipidemia   . Hypertension   . Hypospadias     Past Surgical History:  Procedure Laterality Date  . NASAL SINUS SURGERY    . NISSEN FUNDOPLICATION    . TONSILLECTOMY    . VEIN LIGATION AND STRIPPING       Current Outpatient Medications  Medication Sig Dispense Refill  . amLODipine (NORVASC) 5 MG tablet Take 1 tablet (5 mg total) by mouth daily. 90 tablet 3  . aspirin 81 MG tablet Take 1 tablet (81 mg total) by mouth daily. 90 tablet 3  . atorvastatin (LIPITOR) 40 MG tablet Take 1 tablet (40 mg total) by mouth daily. 90 tablet 3  . Coenzyme Q10 150 MG CAPS Take 1 capsule (150 mg total) by mouth daily. 90 capsule 3  . doxazosin (CARDURA)  2 MG tablet Take 1 tablet (2 mg total) by mouth at bedtime. 90 tablet 3  . gabapentin (NEURONTIN) 300 MG capsule Take 1 capsule (300 mg total) by mouth 2 (two) times daily. 180 capsule 3  . glimepiride (AMARYL) 1 MG tablet Take 1 tablet (1 mg total) by mouth 2 (two) times daily. 180 tablet 3  . LORazepam (ATIVAN) 0.5 MG tablet Take 1 tablet (0.5 mg total) by mouth 2 (two) times daily. 60 tablet 0  . metFORMIN (GLUCOPHAGE) 500 MG tablet Take 2 tablets with AM meal and 1 tablet with PM meal 270 tablet 3  . nebivolol (BYSTOLIC) 2.5 MG tablet Take 1 tablet (2.5 mg total) by mouth daily. 90 tablet 3  . olmesartan (BENICAR) 40 MG tablet Take 1 tablet (40 mg total) by mouth daily. 90 tablet 3  . Omega-3 Fatty Acids (FISH OIL) 1200 MG CAPS Take 1 capsule (1,200 mg total) by mouth daily. 90 capsule 3  . pantoprazole (PROTONIX) 40 MG tablet Take 1 tablet (40 mg total) daily by mouth. 30 tablet 3  . zolpidem (AMBIEN CR) 6.25 MG CR tablet Take 1  tablet (6.25 mg total) by mouth at bedtime as needed for sleep. 90 tablet 0   No current facility-administered medications for this visit.     Allergies:   Iohexol    ROS:  Please see the history of present illness.   Otherwise, review of systems are positive for none.   All other systems are reviewed and negative.    PHYSICAL EXAM: VS:  BP 128/70   Pulse (!) 52   Ht 5\' 7"  (1.702 m)   Wt 204 lb 3.2 oz (92.6 kg)   BMI 31.98 kg/m  , BMI Body mass index is 31.98 kg/m.  GENERAL:  Well appearing NECK:  No jugular venous distention, waveform within normal limits, carotid upstroke brisk and symmetric, no bruits, no thyromegaly LUNGS:  Clear to auscultation bilaterally CHEST:  Unremarkable HEART:  PMI not displaced or sustained,S1 and S2 within normal limits, no S3, no S4, no clicks, no rubs, no murmurs ABD:  Flat, positive bowel sounds normal in frequency in pitch, no bruits, no rebound, no guarding, no midline pulsatile mass, no hepatomegaly, no  splenomegaly EXT:  2 plus pulses throughout, no edema, no cyanosis no clubbing   EKG:  EKG is ordered today. Probable ectopic atrial rhythm with an unusual P wave axis, right bundle branch block, nonspecific ST-T wave changes,  Recent Labs: 09/14/2017: BUN 19; Creatinine, Ser 1.04; Hemoglobin 14.1; Platelets 244.0; Potassium 4.3; Sodium 140    Lipid Panel    Component Value Date/Time   CHOL 184 12/03/2016 1027   TRIG 114.0 12/03/2016 1027   HDL 43.50 12/03/2016 1027   CHOLHDL 4 12/03/2016 1027   VLDL 22.8 12/03/2016 1027   LDLCALC 118 (H) 12/03/2016 1027     Lab Results  Component Value Date   CREATININE 1.04 09/14/2017    Wt Readings from Last 3 Encounters:  01/07/18 204 lb 3.2 oz (92.6 kg)  09/23/17 200 lb (90.7 kg)  09/14/17 199 lb (90.3 kg)      Other studies Reviewed: Additional studies/ records that were reviewed today include: None Review of the above records demonstrates:      ASSESSMENT AND PLAN:  CAD:   The patient has no new sypmtoms.  No further cardiovascular testing is indicated.  We will continue with aggressive risk reduction and meds as listed.  CAROTID STENOSIS:    He had no disease on recent Doppler and no follow up is needed.   HTN:   The blood pressure is at target. No change in medications is indicated. We will continue with therapeutic lifestyle changes (TLC).  DYSLIPIDEMIA:   LDL was 118 and HDL 43.  We talked about lifestyle changes rather than increasing his meds.  DM:  His A1c was 6.7.  He will continue on the meds as listed.  CKD:   Creat was 1.08.  No change in therapy.   OBESITY:    He is maintaining 60 lbs lower than his peak.    Current medicines are reviewed at length with the patient today.  The patient does not have concerns regarding medicines.  The following changes have been made:  None  Labs/ tests ordered today include: None  No orders of the defined types were placed in this encounter.    Disposition:   FU with  me in 12 months.     Signed, Minus Breeding, MD  01/07/2018 5:47 PM    [facility]

## 2018-01-07 ENCOUNTER — Ambulatory Visit (INDEPENDENT_AMBULATORY_CARE_PROVIDER_SITE_OTHER): Payer: Medicare Other | Admitting: Cardiology

## 2018-01-07 ENCOUNTER — Encounter: Payer: Self-pay | Admitting: Cardiology

## 2018-01-07 VITALS — BP 128/70 | HR 52 | Ht 67.0 in | Wt 204.2 lb

## 2018-01-07 DIAGNOSIS — I10 Essential (primary) hypertension: Secondary | ICD-10-CM

## 2018-01-07 DIAGNOSIS — I2510 Atherosclerotic heart disease of native coronary artery without angina pectoris: Secondary | ICD-10-CM | POA: Diagnosis not present

## 2018-01-07 DIAGNOSIS — E785 Hyperlipidemia, unspecified: Secondary | ICD-10-CM | POA: Diagnosis not present

## 2018-01-07 DIAGNOSIS — I6523 Occlusion and stenosis of bilateral carotid arteries: Secondary | ICD-10-CM | POA: Diagnosis not present

## 2018-01-07 NOTE — Patient Instructions (Signed)
Medication Instructions:  Continue current medications  If you need a refill on your cardiac medications before your next appointment, please call your pharmacy.  Labwork: None Ordered   Testing/Procedures: None Ordered  Follow-Up: Your physician wants you to follow-up in: 1 Year. You should receive a reminder letter in the mail two months in advance. If you do not receive a letter, please call our office [phone].    Thank you for choosing [facility]!!      

## 2018-01-12 ENCOUNTER — Ambulatory Visit (INDEPENDENT_AMBULATORY_CARE_PROVIDER_SITE_OTHER): Payer: Medicare Other | Admitting: Adult Health

## 2018-01-12 ENCOUNTER — Encounter: Payer: Self-pay | Admitting: Adult Health

## 2018-01-12 VITALS — BP 120/60 | Temp 97.7°F | Wt 206.0 lb

## 2018-01-12 DIAGNOSIS — M545 Low back pain, unspecified: Secondary | ICD-10-CM

## 2018-01-12 DIAGNOSIS — I6523 Occlusion and stenosis of bilateral carotid arteries: Secondary | ICD-10-CM | POA: Diagnosis not present

## 2018-01-12 NOTE — Progress Notes (Signed)
Subjective:    Patient ID: Ian [name], male    DOB: [birth year], 77 y.o.   MRN: [id number]  HPI  77 year old male who  has a past medical history of Allergy, Apnea, sleep (07/28/07), Carotid artery stenosis, Coronary heart disease (2008), Diabetes mellitus, GERD (gastroesophageal reflux disease), Hyperlipidemia, Hypertension, and Hypospadias.  He presents to the office today for chronic complaint of low back pain. Reports started while in [place] last summer. Has been becoming progressively worse. Reports is waking him up at night around 4 am. Also reports pain in bilateral shoulders and knees. He has been taking Tramadol sparingly but reports improvement. As the day goes on his pain gets better.   Review of Systems See HPI   Past Medical History:  Diagnosis Date  . Allergy   . Apnea, sleep 07/28/07   NPSG [place], AHI 79.9  . Carotid artery stenosis    mild 04/2010, 05/2012  . Coronary heart disease 2008   Cath 2008, 60-70% proximal diagonal 1 stenosis,  RCA 40% stenosis.  nonischemic Lexiscan 03/2011  . Diabetes mellitus    type II  . GERD (gastroesophageal reflux disease)   . Hyperlipidemia   . Hypertension   . Hypospadias     Social History   Socioeconomic History  . Marital status: Married    Spouse name: Not on file  . Number of children: 3  . Years of education: Not on file  . Highest education level: Not on file  Social Needs  . Financial resource strain: Not on file  . Food insecurity - worry: Not on file  . Food insecurity - inability: Not on file  . Transportation needs - medical: Not on file  . Transportation needs - non-medical: Not on file  Occupational History  . Occupation: Retired Games developer: RETIRED  Tobacco Use  . Smoking status: Never Smoker  . Smokeless tobacco: Never Used  Substance and Sexual Activity  . Alcohol use: No    Alcohol/week: 0.0 oz  . Drug use: No  . Sexual activity: Not on file  Other Topics Concern  . Not  on file  Social History Narrative   Married.  Three children from first marriage.  First wife died of ovarian cancer.     Retired, did work for [employer] for 44 years.     Past Surgical History:  Procedure Laterality Date  . NASAL SINUS SURGERY    . NISSEN FUNDOPLICATION    . TONSILLECTOMY    . VEIN LIGATION AND STRIPPING      Family History  Problem Relation Age of Onset  . Coronary artery disease Father 59       CABG  . Dementia Mother     Allergies  Allergen Reactions  . Iohexol      Desc: pt. states severe reaction, dr. told patient never to have the dye again     Current Outpatient Medications on File Prior to Visit  Medication Sig Dispense Refill  . amLODipine (NORVASC) 5 MG tablet Take 1 tablet (5 mg total) by mouth daily. 90 tablet 3  . aspirin 81 MG tablet Take 1 tablet (81 mg total) by mouth daily. 90 tablet 3  . atorvastatin (LIPITOR) 40 MG tablet Take 1 tablet (40 mg total) by mouth daily. 90 tablet 3  . Coenzyme Q10 150 MG CAPS Take 1 capsule (150 mg total) by mouth daily. 90 capsule 3  . doxazosin (CARDURA) 2 MG tablet Take 1  tablet (2 mg total) by mouth at bedtime. 90 tablet 3  . fexofenadine (ALLEGRA) 180 MG tablet Take 180 mg by mouth daily.    Marland Kitchen gabapentin (NEURONTIN) 300 MG capsule Take 1 capsule (300 mg total) by mouth 2 (two) times daily. (Patient taking differently: Take 1-2 capsules in the evening) 180 capsule 3  . glimepiride (AMARYL) 1 MG tablet Take 1 tablet (1 mg total) by mouth 2 (two) times daily. 180 tablet 3  . GLUCOSAMINE-CHONDROITIN PO Take by mouth. 1500 mg glucosamine and 1200 mg of Chondroitin TWICE DAILY    . LORazepam (ATIVAN) 0.5 MG tablet Take 1 tablet (0.5 mg total) by mouth 2 (two) times daily. 60 tablet 0  . metFORMIN (GLUCOPHAGE) 500 MG tablet Take 2 tablets with AM meal and 1 tablet with PM meal 270 tablet 3  . nebivolol (BYSTOLIC) 2.5 MG tablet Take 1 tablet (2.5 mg total) by mouth daily. 90 tablet 3  . olmesartan (BENICAR)  40 MG tablet Take 1 tablet (40 mg total) by mouth daily. 90 tablet 3  . Omega-3 1400 MG CAPS Take 1 capsule by mouth 2 (two) times daily.    . pantoprazole (PROTONIX) 40 MG tablet Take 1 tablet (40 mg total) daily by mouth. 30 tablet 3  . zolpidem (AMBIEN CR) 6.25 MG CR tablet Take 1 tablet (6.25 mg total) by mouth at bedtime as needed for sleep. 90 tablet 0   No current facility-administered medications on file prior to visit.     BP 120/60 (BP Location: Left Arm)   Temp 97.7 F (36.5 C) (Oral)   Wt 206 lb (93.4 kg)   BMI 32.26 kg/m       Objective:   Physical Exam  Constitutional: He is oriented to person, place, and time. He appears well-developed and well-nourished. No distress.  Cardiovascular: Normal rate, regular rhythm, normal heart sounds and intact distal pulses. Exam reveals no gallop and no friction rub.  No murmur heard. Pulmonary/Chest: Effort normal and breath sounds normal. No respiratory distress. He has no wheezes. He has no rales. He exhibits no tenderness.  Musculoskeletal: Normal range of motion. He exhibits tenderness (lower lumbar spine tenderness ).  Neurological: He is alert and oriented to person, place, and time.  Skin: Skin is warm and dry. No rash noted. No erythema. No pallor.  Psychiatric: He has a normal mood and affect. His behavior is normal. Judgment and thought content normal.  Vitals reviewed.     Assessment & Plan:  1. Lumbar spine pain - Advised stretching exercises  - DG Lumbar Spine Complete; Future - Consider PT or orthopedic consult   Dorothyann Peng, NP

## 2018-01-13 NOTE — Addendum Note (Signed)
Addended by: Vennie Homans on: 01/13/2018 09:09 AM   Modules accepted: Orders

## 2018-01-14 ENCOUNTER — Ambulatory Visit (INDEPENDENT_AMBULATORY_CARE_PROVIDER_SITE_OTHER)
Admission: RE | Admit: 2018-01-14 | Discharge: 2018-01-14 | Disposition: A | Discharge status: Home / Self Care | Payer: Medicare Other | Source: Ambulatory Visit | Attending: Adult Health | Admitting: Adult Health

## 2018-01-14 DIAGNOSIS — M545 Low back pain, unspecified: Secondary | ICD-10-CM

## 2018-01-20 ENCOUNTER — Other Ambulatory Visit: Payer: Self-pay | Admitting: Family Medicine

## 2018-01-20 MED ORDER — ZOLPIDEM TARTRATE ER 6.25 MG PO TBCR: 6.2500 mg | EXTENDED_RELEASE_TABLET | Freq: Every evening | ORAL | 0 refills | Status: DC | PRN

## 2018-01-20 NOTE — Telephone Encounter (Signed)
Pt requesting 90 day supply to be sent to [facility].  Please advise.

## 2018-01-28 ENCOUNTER — Other Ambulatory Visit: Payer: Self-pay | Admitting: Adult Health

## 2018-01-28 ENCOUNTER — Encounter: Payer: Self-pay | Admitting: Adult Health

## 2018-01-28 ENCOUNTER — Ambulatory Visit (INDEPENDENT_AMBULATORY_CARE_PROVIDER_SITE_OTHER): Payer: Medicare Other | Admitting: Adult Health

## 2018-01-28 VITALS — BP 118/50 | Temp 97.6°F | Ht 66.0 in | Wt 203.0 lb

## 2018-01-28 DIAGNOSIS — N138 Other obstructive and reflux uropathy: Secondary | ICD-10-CM

## 2018-01-28 DIAGNOSIS — R1084 Generalized abdominal pain: Secondary | ICD-10-CM

## 2018-01-28 DIAGNOSIS — I2510 Atherosclerotic heart disease of native coronary artery without angina pectoris: Secondary | ICD-10-CM | POA: Diagnosis not present

## 2018-01-28 DIAGNOSIS — M545 Low back pain: Secondary | ICD-10-CM

## 2018-01-28 DIAGNOSIS — G8929 Other chronic pain: Secondary | ICD-10-CM | POA: Diagnosis not present

## 2018-01-28 DIAGNOSIS — Z760 Encounter for issue of repeat prescription: Secondary | ICD-10-CM

## 2018-01-28 DIAGNOSIS — I10 Essential (primary) hypertension: Secondary | ICD-10-CM | POA: Diagnosis not present

## 2018-01-28 DIAGNOSIS — E119 Type 2 diabetes mellitus without complications: Secondary | ICD-10-CM | POA: Diagnosis not present

## 2018-01-28 DIAGNOSIS — N401 Enlarged prostate with lower urinary tract symptoms: Secondary | ICD-10-CM | POA: Diagnosis not present

## 2018-01-28 DIAGNOSIS — I6523 Occlusion and stenosis of bilateral carotid arteries: Secondary | ICD-10-CM

## 2018-01-28 DIAGNOSIS — F5104 Psychophysiologic insomnia: Secondary | ICD-10-CM | POA: Diagnosis not present

## 2018-01-28 LAB — CBC WITH DIFFERENTIAL/PLATELET
Basophils Absolute: 0 10*3/uL (ref 0.0–0.1)
Basophils Relative: 0.8 % (ref 0.0–3.0)
Eosinophils Absolute: 0.4 10*3/uL (ref 0.0–0.7)
Eosinophils Relative: 8.6 % — ABNORMAL HIGH (ref 0.0–5.0)
HCT: 41 % (ref 39.0–52.0)
Hemoglobin: 14.1 g/dL (ref 13.0–17.0)
Lymphocytes Relative: 23 % (ref 12.0–46.0)
Lymphs Abs: 1.1 10*3/uL (ref 0.7–4.0)
MCHC: 34.3 g/dL (ref 30.0–36.0)
MCV: 87 fl (ref 78.0–100.0)
Monocytes Absolute: 0.4 10*3/uL (ref 0.1–1.0)
Monocytes Relative: 7.8 % (ref 3.0–12.0)
Neutro Abs: 3 10*3/uL (ref 1.4–7.7)
Neutrophils Relative %: 59.8 % (ref 43.0–77.0)
Platelets: 170 10*3/uL (ref 150.0–400.0)
RBC: 4.71 Mil/uL (ref 4.22–5.81)
RDW: 13.3 % (ref 11.5–15.5)
WBC: 5 10*3/uL (ref 4.0–10.5)

## 2018-01-28 LAB — HEMOGLOBIN A1C: Hgb A1c MFr Bld: 6.1 % (ref 4.6–6.5)

## 2018-01-28 LAB — LIPID PANEL
Cholesterol: 151 mg/dL (ref 0–200)
HDL: 42.4 mg/dL (ref >39.00)
LDL Cholesterol: 87 mg/dL (ref 0–99)
NonHDL: 108.81
Total CHOL/HDL Ratio: 4
Triglycerides: 110 mg/dL (ref 0.0–149.0)
VLDL: 22 mg/dL (ref 0.0–40.0)

## 2018-01-28 LAB — COMPREHENSIVE METABOLIC PANEL
ALT: 13 U/L (ref 0–53)
AST: 15 U/L (ref 0–37)
Albumin: 4.2 g/dL (ref 3.5–5.2)
Alkaline Phosphatase: 51 U/L (ref 39–117)
BUN: 23 mg/dL (ref 6–23)
CO2: 27 mEq/L (ref 19–32)
Calcium: 9.3 mg/dL (ref 8.4–10.5)
Chloride: 105 mEq/L (ref 96–112)
Creatinine, Ser: 1.14 mg/dL (ref 0.40–1.50)
GFR: 65.92 mL/min (ref >60.00)
Glucose, Bld: 111 mg/dL — ABNORMAL HIGH (ref 70–99)
Potassium: 4.3 mEq/L (ref 3.5–5.1)
Sodium: 141 mEq/L (ref 135–145)
Total Bilirubin: 1.1 mg/dL (ref 0.2–1.2)
Total Protein: 6.7 g/dL (ref 6.0–8.3)

## 2018-01-28 LAB — PSA: PSA: 1 ng/mL (ref 0.10–4.00)

## 2018-01-28 LAB — TSH: TSH: 1.1 u[IU]/mL (ref 0.35–4.50)

## 2018-01-28 MED ORDER — DOXAZOSIN MESYLATE 2 MG PO TABS: 2.0000 mg | ORAL_TABLET | Freq: Every day | ORAL | 3 refills | Status: DC

## 2018-01-28 MED ORDER — METFORMIN HCL 500 MG PO TABS: ORAL_TABLET | ORAL | 3 refills | Status: DC

## 2018-01-28 MED ORDER — LORAZEPAM 0.5 MG PO TABS: 0.5000 mg | ORAL_TABLET | Freq: Two times a day (BID) | ORAL | 0 refills | Status: DC

## 2018-01-28 MED ORDER — NEBIVOLOL HCL 2.5 MG PO TABS: 2.5000 mg | ORAL_TABLET | Freq: Every day | ORAL | 3 refills | Status: DC

## 2018-01-28 MED ORDER — ATORVASTATIN CALCIUM 40 MG PO TABS: 40.0000 mg | ORAL_TABLET | Freq: Every day | ORAL | 3 refills | Status: DC

## 2018-01-28 MED ORDER — OLMESARTAN MEDOXOMIL 40 MG PO TABS: 40.0000 mg | ORAL_TABLET | Freq: Every day | ORAL | 3 refills | Status: DC

## 2018-01-28 MED ORDER — GABAPENTIN 300 MG PO CAPS: ORAL_CAPSULE | ORAL | 3 refills | Status: DC

## 2018-01-28 MED ORDER — FEXOFENADINE HCL 180 MG PO TABS: 180.0000 mg | ORAL_TABLET | Freq: Every day | ORAL | 3 refills | Status: DC

## 2018-01-28 MED ORDER — GLIMEPIRIDE 1 MG PO TABS: 1.0000 mg | ORAL_TABLET | Freq: Two times a day (BID) | ORAL | 3 refills | Status: DC

## 2018-01-28 MED ORDER — PANTOPRAZOLE SODIUM 40 MG PO TBEC: 40.0000 mg | DELAYED_RELEASE_TABLET | Freq: Every day | ORAL | 3 refills | Status: DC

## 2018-01-28 MED ORDER — TRAMADOL HCL 50 MG PO TABS: 50.0000 mg | ORAL_TABLET | Freq: Three times a day (TID) | ORAL | 0 refills | Status: DC | PRN

## 2018-01-28 NOTE — Progress Notes (Signed)
Subjective:    Patient ID: Ian [name], male    DOB: 19-Aug-1941, 77 y.o.   MRN: [id number]  HPI Patient presents for yearly preventative medicine examination. He is up to date on health maintenance.     All immunizations and health maintenance protocols were reviewed with the patient and needed orders were placed.   Appropriate screening laboratory values were ordered for the patient including screening of hyperlipidemia, renal function and hepatic function.  If indicated by BPH, a PSA was ordered.  Medication reconciliation,  past medical history, social history, problem list and allergies were reviewed in detail with the patient.  Patient is retired from the [employer].    Goals were established with regard to weight loss, exercise, and  diet in compliance with medications.  He is walking outside around 1 mile a day.  He is also following a diabetic diet as closely as he is able.  He has had a small amount of weight loss over the last year. He contributes part of this to his diet and exercise.  End of life planning was discussed. He has a living will and a HCPOA in place.    Review of Systems  Constitutional: Positive for fatigue. Negative for chills, fever and unexpected weight change.  Respiratory: Positive for chest tightness. Negative for shortness of breath.        With walking.  Was worked up by cardiology recently.   Cardiovascular: Negative.   Gastrointestinal: Positive for constipation. Negative for blood in stool, diarrhea and nausea.  Endocrine: Negative for polydipsia, polyphagia and polyuria.  Genitourinary: Negative.   Musculoskeletal: Positive for arthralgias and back pain. Negative for gait problem and joint swelling.  Neurological: Positive for dizziness and light-headedness. Negative for weakness and headaches.      Past Medical History:  Diagnosis Date  . Allergy   . Apnea, sleep 07/28/07   NPSG [place], AHI 79.9  . Carotid artery stenosis    mild  04/2010, 05/2012  . Coronary heart disease 2008   Cath 2008, 60-70% proximal diagonal 1 stenosis,  RCA 40% stenosis.  nonischemic Lexiscan 03/2011  . Diabetes mellitus    type II  . GERD (gastroesophageal reflux disease)   . Hyperlipidemia   . Hypertension   . Hypospadias     Social History   Socioeconomic History  . Marital status: Married    Spouse name: Not on file  . Number of children: 3  . Years of education: Not on file  . Highest education level: Not on file  Occupational History  . Occupation: Retired Games developer: RETIRED  Social Needs  . Financial resource strain: Not on file  . Food insecurity:    Worry: Not on file    Inability: Not on file  . Transportation needs:    Medical: Not on file    Non-medical: Not on file  Tobacco Use  . Smoking status: Never Smoker  . Smokeless tobacco: Never Used  Substance and Sexual Activity  . Alcohol use: No    Alcohol/week: 0.0 oz  . Drug use: No  . Sexual activity: Not on file  Lifestyle  . Physical activity:    Days per week: Not on file    Minutes per session: Not on file  . Stress: Not on file  Relationships  . Social connections:    Talks on phone: Not on file    Gets together: Not on file    Attends religious service: Not  on file    Active member of club or organization: Not on file    Attends meetings of clubs or organizations: Not on file    Relationship status: Not on file  . Intimate partner violence:    Fear of current or ex partner: Not on file    Emotionally abused: Not on file    Physically abused: Not on file    Forced sexual activity: Not on file  Other Topics Concern  . Not on file  Social History Narrative   Married.  Three children from first marriage.  First wife died of ovarian cancer.     Retired, did work for [employer] for 44 years.     Past Surgical History:  Procedure Laterality Date  . NASAL SINUS SURGERY    . NISSEN FUNDOPLICATION    . TONSILLECTOMY    . VEIN  LIGATION AND STRIPPING      Family History  Problem Relation Age of Onset  . Coronary artery disease Father 59       CABG  . Dementia Mother     Allergies  Allergen Reactions  . Iohexol      Desc: pt. states severe reaction, dr. told patient never to have the dye again     Current Outpatient Medications on File Prior to Visit  Medication Sig Dispense Refill  . amLODipine (NORVASC) 5 MG tablet Take 1 tablet (5 mg total) by mouth daily. 90 tablet 3  . aspirin 81 MG tablet Take 1 tablet (81 mg total) by mouth daily. 90 tablet 3  . atorvastatin (LIPITOR) 40 MG tablet Take 1 tablet (40 mg total) by mouth daily. 90 tablet 3  . Coenzyme Q10 150 MG CAPS Take 1 capsule (150 mg total) by mouth daily. 90 capsule 3  . doxazosin (CARDURA) 2 MG tablet Take 1 tablet (2 mg total) by mouth at bedtime. 90 tablet 3  . fexofenadine (ALLEGRA) 180 MG tablet Take 180 mg by mouth daily.    Marland Kitchen gabapentin (NEURONTIN) 300 MG capsule Take 1 capsule (300 mg total) by mouth 2 (two) times daily. (Patient taking differently: Take 1-2 capsules in the evening) 180 capsule 3  . glimepiride (AMARYL) 1 MG tablet Take 1 tablet (1 mg total) by mouth 2 (two) times daily. 180 tablet 3  . GLUCOSAMINE-CHONDROITIN PO Take by mouth. 1500 mg glucosamine and 1200 mg of Chondroitin TWICE DAILY    . LORazepam (ATIVAN) 0.5 MG tablet Take 1 tablet (0.5 mg total) by mouth 2 (two) times daily. 60 tablet 0  . metFORMIN (GLUCOPHAGE) 500 MG tablet Take 2 tablets with AM meal and 1 tablet with PM meal 270 tablet 3  . nebivolol (BYSTOLIC) 2.5 MG tablet Take 1 tablet (2.5 mg total) by mouth daily. 90 tablet 3  . olmesartan (BENICAR) 40 MG tablet Take 1 tablet (40 mg total) by mouth daily. 90 tablet 3  . Omega-3 1400 MG CAPS Take 1 capsule by mouth 2 (two) times daily.    . pantoprazole (PROTONIX) 40 MG tablet Take 1 tablet (40 mg total) daily by mouth. 30 tablet 3  . zolpidem (AMBIEN CR) 6.25 MG CR tablet Take 1 tablet (6.25 mg total) by  mouth at bedtime as needed for sleep. 90 tablet 0   No current facility-administered medications on file prior to visit.     BP (!) 118/50 (BP Location: Left Arm)   Temp 97.6 F (36.4 C) (Oral)   Ht 5\' 6"  (1.676 m) Comment: With Shoes  Wt 203 lb (92.1 kg)   BMI 32.77 kg/m    Objective:   Physical Exam  Constitutional: He is oriented to person, place, and time. He appears well-developed and well-nourished. No distress.  HENT:  Head: Normocephalic and atraumatic.  Right Ear: External ear normal.  Left Ear: External ear normal.  Nose: Nose normal.  Mouth/Throat: Oropharynx is clear and moist. No oropharyngeal exudate.  Eyes: Pupils are equal, round, and reactive to light. EOM are normal. Right eye exhibits no discharge. Left eye exhibits no discharge. No scleral icterus.  Neck: Normal range of motion. Neck supple. No JVD present. No tracheal deviation present. No thyromegaly present.  Cardiovascular: Normal rate, regular rhythm, normal heart sounds and intact distal pulses. Exam reveals no gallop and no friction rub.  No murmur heard. Pulmonary/Chest: Effort normal and breath sounds normal. No respiratory distress. He has no wheezes. He has no rales.  Abdominal: Soft. Bowel sounds are normal. He exhibits no distension and no mass. There is no tenderness. There is no rebound and no guarding.  Musculoskeletal: Normal range of motion.       Right knee: He exhibits normal range of motion, no swelling, no effusion, no deformity, normal alignment and normal patellar mobility. No tenderness found. No MCL, no LCL and no patellar tendon tenderness noted.       Left knee: He exhibits normal range of motion, no swelling, no effusion, no deformity, normal alignment and normal patellar mobility. No tenderness found. No MCL, no LCL and no patellar tendon tenderness noted.  Tightness in the low back paraspinous muscles.  No tenderness of spinal process.    Lymphadenopathy:    He has no cervical  adenopathy.  Neurological: He is alert and oriented to person, place, and time. He has normal reflexes. He exhibits normal muscle tone. Coordination normal.  Skin: Skin is warm and dry. He is not diaphoretic.  Psychiatric: He has a normal mood and affect. His behavior is normal. Judgment and thought content normal.  Nursing note and vitals reviewed.     Assessment & Plan:  1. Essential hypertension Will check lab work and notify of results.  Please monitor your BP at home.  May be able to decrease your medications if BP continues at current trend.  - CBC with Differential/Platelet - Comprehensive metabolic panel - Hemoglobin A1c - Lipid panel - TSH - PSA - nebivolol (BYSTOLIC) 2.5 MG tablet; Take 1 tablet (2.5 mg total) by mouth daily.  Dispense: 90 tablet; Refill: 3 - olmesartan (BENICAR) 40 MG tablet; Take 1 tablet (40 mg total) by mouth daily.  Dispense: 90 tablet; Refill: 3  2. Atherosclerosis of native coronary artery of native heart without angina pectoris Recent cardiology visit.  Will check lab work and notify of results.  - CBC with Differential/Platelet - Comprehensive metabolic panel - Hemoglobin A1c - Lipid panel - TSH - PSA - atorvastatin (LIPITOR) 40 MG tablet; Take 1 tablet (40 mg total) by mouth daily.  Dispense: 90 tablet; Refill: 3  3. Diabetes mellitus without complication (HCC) Fasting blood sugars have been well controlled with diet, exercise and medications.  Continue to check blood sugars at home.  - CBC with Differential/Platelet - Comprehensive metabolic panel - Hemoglobin A1c - Lipid panel - TSH - PSA - glimepiride (AMARYL) 1 MG tablet; Take 1 tablet (1 mg total) by mouth 2 (two) times daily.  Dispense: 180 tablet; Refill: 3 - metFORMIN (GLUCOPHAGE) 500 MG tablet; Take 2 tablets with AM meal and 1 tablet  with PM meal  Dispense: 270 tablet; Refill: 3  4. BPH with urinary obstruction Will check lab work and notify of results.  - PSA - doxazosin  (CARDURA) 2 MG tablet; Take 1 tablet (2 mg total) by mouth at bedtime.  Dispense: 90 tablet; Refill: 3  5. Chronic insomnia Continue on current medication regimen. Is and can continue to use OTC Melatonin at bedtime.   6. Chronic bilateral low back pain without sciatica Continue exercise, gentle stretching.  PT will call and schedule your appointment.  - Ambulatory referral to Physical Therapy  7. Medication refill All medication refills provided to [facility].  - doxazosin (CARDURA) 2 MG tablet; Take 1 tablet (2 mg total) by mouth at bedtime.  Dispense: 90 tablet; Refill: 3 - gabapentin (NEURONTIN) 300 MG capsule; Take 1-2 capsules in the evening  Dispense: 180 capsule; Refill: 3 - glimepiride (AMARYL) 1 MG tablet; Take 1 tablet (1 mg total) by mouth 2 (two) times daily.  Dispense: 180 tablet; Refill: 3 - LORazepam (ATIVAN) 0.5 MG tablet; Take 1 tablet (0.5 mg total) by mouth 2 (two) times daily.  Dispense: 60 tablet; Refill: 0 - metFORMIN (GLUCOPHAGE) 500 MG tablet; Take 2 tablets with AM meal and 1 tablet with PM meal  Dispense: 270 tablet; Refill: 3 - olmesartan (BENICAR) 40 MG tablet; Take 1 tablet (40 mg total) by mouth daily.  Dispense: 90 tablet; Refill: 3  8. Generalized abdominal pain Epigastric pain has been relieved by current therapy.   - pantoprazole (PROTONIX) 40 MG tablet; Take 1 tablet (40 mg total) by mouth daily.  Dispense: 30 tablet; Refill: 3  Follow up as needed.  Claris Pech C Marce Schartz BSN RN NP student

## 2018-01-28 NOTE — Patient Instructions (Addendum)
I am going to have you stop taking Norvasc 5 mg. Please monitor your blood pressure at home and let me know if your blood pressure is above 140/90   I am going to refer you to Physical therapy for back pain, I am going to try and get you in at [facility] [place]. [place] [address] [place]  I will follow up with you regarding your blood work

## 2018-01-28 NOTE — Progress Notes (Signed)
Subjective:    Patient ID: Ian [name], male    DOB: 27-Jan-1941, 77 y.o.   MRN: [id number]  HPI  Patient presents for yearly preventative medicine examination. He is a pleasant 77 year old male who  has a past medical history of Allergy, Apnea, sleep (07/28/07), Carotid artery stenosis, Coronary heart disease (2008), Diabetes mellitus, GERD (gastroesophageal reflux disease), Hyperlipidemia, Hypertension, and Hypospadias.  Is a history of hypertension for which she takes Norvasc 5 mg, Benicar 40 mg, Bystolic 2.5 mg.  He is stable on this regimen but does report infrequent episodes of dizziness. He did not take his medications this morning  BP Readings from Last 3 Encounters:  01/28/18 (!) 118/50  01/12/18 120/60  01/07/18 128/70   A history of CAD for which she takes a statin and daily aspirin.  He denies any new chest pain, shortness of breath, palpitations, or syncopal episodes.  He has a history of diabetes mellitus type 2 for which he takes metformin 1000 mg in the morning and 500 mg in the evening as well as Amaryl 1 mg Lab Results  Component Value Date   HGBA1C 6.7 (H) 09/14/2017   He has a history of insomnia for which she takes Ambien CR 6.25 mg nightly  All immunizations and health maintenance protocols were reviewed with the patient and needed orders were placed.  Appropriate screening laboratory values were ordered for the patient including screening of hyperlipidemia, renal function and hepatic function. If indicated by BPH, a PSA was ordered.  Medication reconciliation,  past medical history, social history, problem list and allergies were reviewed in detail with the patient  Goals were established with regard to weight loss, exercise, and  diet in compliance with medications Wt Readings from Last 3 Encounters:  01/28/18 203 lb (92.1 kg)  01/12/18 206 lb (93.4 kg)  01/07/18 204 lb 3.2 oz (92.6 kg)   End of life planning was discussed.  He has a healthcare power of  attorney and a living will  He is no longer in need of colonoscopies due to age.  He is up-to-date on dental and vision exams  He has chronic arthritic pain in his low back, bilateral knees, and left shoulder. Also has pain that he feels as is more muscular in his mid back. Pain is worse at night and reports that when he gets up in the night to use the bathroom he has trouble falling back to sleep due to pain. He uses Tramadol very rarely but reports relief with this.   He will be leaving for [place] in a few months, he spends the summer up there. He will need refills of all his medications.   Review of Systems  Constitutional: Negative.   HENT: Negative.   Eyes: Negative.   Respiratory: Negative.   Cardiovascular: Negative.   Gastrointestinal: Negative.   Endocrine: Negative.   Genitourinary: Negative.   Musculoskeletal: Positive for arthralgias and back pain.  Skin: Negative.   Allergic/Immunologic: Negative.   Neurological: Negative.   Hematological: Negative.   Psychiatric/Behavioral: Negative.   All other systems reviewed and are negative.  Past Medical History:  Diagnosis Date  . Allergy   . Apnea, sleep 07/28/07   NPSG [place], AHI 79.9  . Carotid artery stenosis    mild 04/2010, 05/2012  . Coronary heart disease 2008   Cath 2008, 60-70% proximal diagonal 1 stenosis,  RCA 40% stenosis.  nonischemic Lexiscan 03/2011  . Diabetes mellitus    type II  .  GERD (gastroesophageal reflux disease)   . Hyperlipidemia   . Hypertension   . Hypospadias     Social History   Socioeconomic History  . Marital status: Married    Spouse name: Not on file  . Number of children: 3  . Years of education: Not on file  . Highest education level: Not on file  Occupational History  . Occupation: Retired Games developer: RETIRED  Social Needs  . Financial resource strain: Not on file  . Food insecurity:    Worry: Not on file    Inability: Not on file  . Transportation  needs:    Medical: Not on file    Non-medical: Not on file  Tobacco Use  . Smoking status: Never Smoker  . Smokeless tobacco: Never Used  Substance and Sexual Activity  . Alcohol use: No    Alcohol/week: 0.0 oz  . Drug use: No  . Sexual activity: Not on file  Lifestyle  . Physical activity:    Days per week: Not on file    Minutes per session: Not on file  . Stress: Not on file  Relationships  . Social connections:    Talks on phone: Not on file    Gets together: Not on file    Attends religious service: Not on file    Active member of club or organization: Not on file    Attends meetings of clubs or organizations: Not on file    Relationship status: Not on file  . Intimate partner violence:    Fear of current or ex partner: Not on file    Emotionally abused: Not on file    Physically abused: Not on file    Forced sexual activity: Not on file  Other Topics Concern  . Not on file  Social History Narrative   Married.  Three children from first marriage.  First wife died of ovarian cancer.     Retired, did work for [employer] for 44 years.     Past Surgical History:  Procedure Laterality Date  . NASAL SINUS SURGERY    . NISSEN FUNDOPLICATION    . TONSILLECTOMY    . VEIN LIGATION AND STRIPPING      Family History  Problem Relation Age of Onset  . Coronary artery disease Father 46       CABG  . Dementia Mother     Allergies  Allergen Reactions  . Iohexol      Desc: pt. states severe reaction, dr. told patient never to have the dye again     Current Outpatient Medications on File Prior to Visit  Medication Sig Dispense Refill  . amLODipine (NORVASC) 5 MG tablet Take 1 tablet (5 mg total) by mouth daily. 90 tablet 3  . aspirin 81 MG tablet Take 1 tablet (81 mg total) by mouth daily. 90 tablet 3  . atorvastatin (LIPITOR) 40 MG tablet Take 1 tablet (40 mg total) by mouth daily. 90 tablet 3  . Coenzyme Q10 150 MG CAPS Take 1 capsule (150 mg total) by mouth  daily. 90 capsule 3  . doxazosin (CARDURA) 2 MG tablet Take 1 tablet (2 mg total) by mouth at bedtime. 90 tablet 3  . fexofenadine (ALLEGRA) 180 MG tablet Take 180 mg by mouth daily.    Marland Kitchen gabapentin (NEURONTIN) 300 MG capsule Take 1 capsule (300 mg total) by mouth 2 (two) times daily. (Patient taking differently: Take 1-2 capsules in the evening) 180 capsule 3  .  glimepiride (AMARYL) 1 MG tablet Take 1 tablet (1 mg total) by mouth 2 (two) times daily. 180 tablet 3  . GLUCOSAMINE-CHONDROITIN PO Take by mouth. 1500 mg glucosamine and 1200 mg of Chondroitin TWICE DAILY    . LORazepam (ATIVAN) 0.5 MG tablet Take 1 tablet (0.5 mg total) by mouth 2 (two) times daily. 60 tablet 0  . metFORMIN (GLUCOPHAGE) 500 MG tablet Take 2 tablets with AM meal and 1 tablet with PM meal 270 tablet 3  . nebivolol (BYSTOLIC) 2.5 MG tablet Take 1 tablet (2.5 mg total) by mouth daily. 90 tablet 3  . olmesartan (BENICAR) 40 MG tablet Take 1 tablet (40 mg total) by mouth daily. 90 tablet 3  . Omega-3 1400 MG CAPS Take 1 capsule by mouth 2 (two) times daily.    . pantoprazole (PROTONIX) 40 MG tablet Take 1 tablet (40 mg total) daily by mouth. 30 tablet 3  . zolpidem (AMBIEN CR) 6.25 MG CR tablet Take 1 tablet (6.25 mg total) by mouth at bedtime as needed for sleep. 90 tablet 0   No current facility-administered medications on file prior to visit.     BP (!) 118/50 (BP Location: Left Arm)   Temp 97.6 F (36.4 C) (Oral)   Ht 5\' 6"  (1.676 m) Comment: With Shoes  Wt 203 lb (92.1 kg)   BMI 32.77 kg/m       Objective:   Physical Exam  Constitutional: He is oriented to person, place, and time. He appears well-developed and well-nourished. No distress.  HENT:  Head: Normocephalic and atraumatic.  Right Ear: External ear normal.  Left Ear: External ear normal.  Nose: Nose normal.  Mouth/Throat: Oropharynx is clear and moist. No oropharyngeal exudate.  Eyes: Pupils are equal, round, and reactive to light. Conjunctivae  and EOM are normal. Right eye exhibits no discharge. Left eye exhibits no discharge. No scleral icterus.  Neck: Normal range of motion. Neck supple. No JVD present. No tracheal deviation present. No thyromegaly present.  Cardiovascular: Normal rate, regular rhythm, normal heart sounds and intact distal pulses. Exam reveals no gallop and no friction rub.  No murmur heard. Pulmonary/Chest: Effort normal and breath sounds normal. No stridor. No respiratory distress. He has no wheezes. He has no rales. He exhibits no tenderness.  Abdominal: Soft. Bowel sounds are normal. He exhibits no distension and no mass. There is no tenderness. There is no rebound and no guarding.  Musculoskeletal: Normal range of motion. He exhibits tenderness (lower lumbar spine andleft shoulder ). He exhibits no edema or deformity.  Lymphadenopathy:    He has no cervical adenopathy.  Neurological: He is alert and oriented to person, place, and time. He has normal reflexes. He displays normal reflexes. No cranial nerve deficit. He exhibits normal muscle tone. Coordination normal.  Skin: Skin is warm and dry. No rash noted. He is not diaphoretic. No erythema. No pallor.  Psychiatric: He has a normal mood and affect. His behavior is normal. Judgment and thought content normal.  Nursing note and vitals reviewed.     Assessment & Plan:  1. Essential hypertension - BP has been trending low. I am going to have him stop Norvasc and check his BP at home. Report back to me via mychart if BP is 140/90 or above.  - CBC with Differential/Platelet - Comprehensive metabolic panel - Hemoglobin A1c - Lipid panel - TSH - PSA - nebivolol (BYSTOLIC) 2.5 MG tablet; Take 1 tablet (2.5 mg total) by mouth daily.  Dispense: 90  tablet; Refill: 3 - olmesartan (BENICAR) 40 MG tablet; Take 1 tablet (40 mg total) by mouth daily.  Dispense: 90 tablet; Refill: 3  2. Atherosclerosis of native coronary artery of native heart without angina  pectoris  - CBC with Differential/Platelet - Comprehensive metabolic panel - Hemoglobin A1c - Lipid panel - TSH - PSA - atorvastatin (LIPITOR) 40 MG tablet; Take 1 tablet (40 mg total) by mouth daily.  Dispense: 90 tablet; Refill: 3  3. Diabetes mellitus without complication ([place]) - Consider increasing metformin  - CBC with Differential/Platelet - Comprehensive metabolic panel - Hemoglobin A1c - Lipid panel - TSH - PSA - glimepiride (AMARYL) 1 MG tablet; Take 1 tablet (1 mg total) by mouth 2 (two) times daily.  Dispense: 180 tablet; Refill: 3 - metFORMIN (GLUCOPHAGE) 500 MG tablet; Take 2 tablets with AM meal and 1 tablet with PM meal  Dispense: 270 tablet; Refill: 3  4. BPH with urinary obstruction  - PSA - doxazosin (CARDURA) 2 MG tablet; Take 1 tablet (2 mg total) by mouth at bedtime.  Dispense: 90 tablet; Refill: 3  5. Chronic insomnia - Continue with Ambien CR  6. Chronic bilateral low back pain without sciatica - Will refer to [facility] physical therapy  - Ambulatory referral to Physical Therapy  7. Medication refill  - doxazosin (CARDURA) 2 MG tablet; Take 1 tablet (2 mg total) by mouth at bedtime.  Dispense: 90 tablet; Refill: 3 - gabapentin (NEURONTIN) 300 MG capsule; Take 1-2 capsules in the evening  Dispense: 180 capsule; Refill: 3 - glimepiride (AMARYL) 1 MG tablet; Take 1 tablet (1 mg total) by mouth 2 (two) times daily.  Dispense: 180 tablet; Refill: 3 - LORazepam (ATIVAN) 0.5 MG tablet; Take 1 tablet (0.5 mg total) by mouth 2 (two) times daily.  Dispense: 60 tablet; Refill: 0 - metFORMIN (GLUCOPHAGE) 500 MG tablet; Take 2 tablets with AM meal and 1 tablet with PM meal  Dispense: 270 tablet; Refill: 3 - olmesartan (BENICAR) 40 MG tablet; Take 1 tablet (40 mg total) by mouth daily.  Dispense: 90 tablet; Refill: 3  8. Generalized abdominal pain  - pantoprazole (PROTONIX) 40 MG tablet; Take 1 tablet (40 mg total) by mouth daily.  Dispense: 30 tablet; Refill:  3   Dorothyann Peng, NP

## 2018-02-03 DIAGNOSIS — M62551 Muscle wasting and atrophy, not elsewhere classified, right thigh: Secondary | ICD-10-CM | POA: Diagnosis not present

## 2018-02-03 DIAGNOSIS — M25551 Pain in right hip: Secondary | ICD-10-CM | POA: Diagnosis not present

## 2018-02-03 DIAGNOSIS — M545 Low back pain: Secondary | ICD-10-CM | POA: Diagnosis not present

## 2018-02-03 DIAGNOSIS — M62552 Muscle wasting and atrophy, not elsewhere classified, left thigh: Secondary | ICD-10-CM | POA: Diagnosis not present

## 2018-02-07 DIAGNOSIS — M62552 Muscle wasting and atrophy, not elsewhere classified, left thigh: Secondary | ICD-10-CM | POA: Diagnosis not present

## 2018-02-07 DIAGNOSIS — M62551 Muscle wasting and atrophy, not elsewhere classified, right thigh: Secondary | ICD-10-CM | POA: Diagnosis not present

## 2018-02-07 DIAGNOSIS — M25551 Pain in right hip: Secondary | ICD-10-CM | POA: Diagnosis not present

## 2018-02-07 DIAGNOSIS — M545 Low back pain: Secondary | ICD-10-CM | POA: Diagnosis not present

## 2018-02-09 DIAGNOSIS — M62552 Muscle wasting and atrophy, not elsewhere classified, left thigh: Secondary | ICD-10-CM | POA: Diagnosis not present

## 2018-02-09 DIAGNOSIS — M25551 Pain in right hip: Secondary | ICD-10-CM | POA: Diagnosis not present

## 2018-02-09 DIAGNOSIS — M545 Low back pain: Secondary | ICD-10-CM | POA: Diagnosis not present

## 2018-02-09 DIAGNOSIS — M62551 Muscle wasting and atrophy, not elsewhere classified, right thigh: Secondary | ICD-10-CM | POA: Diagnosis not present

## 2018-02-11 DIAGNOSIS — M545 Low back pain: Secondary | ICD-10-CM | POA: Diagnosis not present

## 2018-02-11 DIAGNOSIS — M62551 Muscle wasting and atrophy, not elsewhere classified, right thigh: Secondary | ICD-10-CM | POA: Diagnosis not present

## 2018-02-11 DIAGNOSIS — M62552 Muscle wasting and atrophy, not elsewhere classified, left thigh: Secondary | ICD-10-CM | POA: Diagnosis not present

## 2018-02-11 DIAGNOSIS — M25551 Pain in right hip: Secondary | ICD-10-CM | POA: Diagnosis not present

## 2018-02-14 DIAGNOSIS — M25551 Pain in right hip: Secondary | ICD-10-CM | POA: Diagnosis not present

## 2018-02-14 DIAGNOSIS — M62552 Muscle wasting and atrophy, not elsewhere classified, left thigh: Secondary | ICD-10-CM | POA: Diagnosis not present

## 2018-02-14 DIAGNOSIS — H02403 Unspecified ptosis of bilateral eyelids: Secondary | ICD-10-CM | POA: Diagnosis not present

## 2018-02-14 DIAGNOSIS — H5702 Anisocoria: Secondary | ICD-10-CM | POA: Diagnosis not present

## 2018-02-14 DIAGNOSIS — M545 Low back pain: Secondary | ICD-10-CM | POA: Diagnosis not present

## 2018-02-14 DIAGNOSIS — M62551 Muscle wasting and atrophy, not elsewhere classified, right thigh: Secondary | ICD-10-CM | POA: Diagnosis not present

## 2018-02-14 DIAGNOSIS — H40013 Open angle with borderline findings, low risk, bilateral: Secondary | ICD-10-CM | POA: Diagnosis not present

## 2018-02-14 DIAGNOSIS — H04123 Dry eye syndrome of bilateral lacrimal glands: Secondary | ICD-10-CM | POA: Diagnosis not present

## 2018-02-14 LAB — HM DIABETES EYE EXAM

## 2018-02-16 DIAGNOSIS — M62551 Muscle wasting and atrophy, not elsewhere classified, right thigh: Secondary | ICD-10-CM | POA: Diagnosis not present

## 2018-02-16 DIAGNOSIS — M25551 Pain in right hip: Secondary | ICD-10-CM | POA: Diagnosis not present

## 2018-02-16 DIAGNOSIS — M545 Low back pain: Secondary | ICD-10-CM | POA: Diagnosis not present

## 2018-02-16 DIAGNOSIS — M62552 Muscle wasting and atrophy, not elsewhere classified, left thigh: Secondary | ICD-10-CM | POA: Diagnosis not present

## 2018-02-17 ENCOUNTER — Encounter: Payer: Self-pay | Admitting: Adult Health

## 2018-02-17 ENCOUNTER — Other Ambulatory Visit: Payer: Self-pay | Admitting: Adult Health

## 2018-02-17 MED ORDER — CYCLOBENZAPRINE HCL 5 MG PO TABS: 5.0000 mg | ORAL_TABLET | Freq: Three times a day (TID) | ORAL | 0 refills | Status: DC | PRN

## 2018-02-18 DIAGNOSIS — M25551 Pain in right hip: Secondary | ICD-10-CM | POA: Diagnosis not present

## 2018-02-18 DIAGNOSIS — M62551 Muscle wasting and atrophy, not elsewhere classified, right thigh: Secondary | ICD-10-CM | POA: Diagnosis not present

## 2018-02-18 DIAGNOSIS — M545 Low back pain: Secondary | ICD-10-CM | POA: Diagnosis not present

## 2018-02-18 DIAGNOSIS — M62552 Muscle wasting and atrophy, not elsewhere classified, left thigh: Secondary | ICD-10-CM | POA: Diagnosis not present

## 2018-02-21 DIAGNOSIS — M25551 Pain in right hip: Secondary | ICD-10-CM | POA: Diagnosis not present

## 2018-02-21 DIAGNOSIS — M545 Low back pain: Secondary | ICD-10-CM | POA: Diagnosis not present

## 2018-02-21 DIAGNOSIS — M62552 Muscle wasting and atrophy, not elsewhere classified, left thigh: Secondary | ICD-10-CM | POA: Diagnosis not present

## 2018-02-21 DIAGNOSIS — M62551 Muscle wasting and atrophy, not elsewhere classified, right thigh: Secondary | ICD-10-CM | POA: Diagnosis not present

## 2018-02-23 DIAGNOSIS — M62551 Muscle wasting and atrophy, not elsewhere classified, right thigh: Secondary | ICD-10-CM | POA: Diagnosis not present

## 2018-02-23 DIAGNOSIS — M545 Low back pain: Secondary | ICD-10-CM | POA: Diagnosis not present

## 2018-02-23 DIAGNOSIS — M25551 Pain in right hip: Secondary | ICD-10-CM | POA: Diagnosis not present

## 2018-02-23 DIAGNOSIS — M62552 Muscle wasting and atrophy, not elsewhere classified, left thigh: Secondary | ICD-10-CM | POA: Diagnosis not present

## 2018-02-25 DIAGNOSIS — M25551 Pain in right hip: Secondary | ICD-10-CM | POA: Diagnosis not present

## 2018-02-25 DIAGNOSIS — M62551 Muscle wasting and atrophy, not elsewhere classified, right thigh: Secondary | ICD-10-CM | POA: Diagnosis not present

## 2018-02-25 DIAGNOSIS — M545 Low back pain: Secondary | ICD-10-CM | POA: Diagnosis not present

## 2018-02-25 DIAGNOSIS — M62552 Muscle wasting and atrophy, not elsewhere classified, left thigh: Secondary | ICD-10-CM | POA: Diagnosis not present

## 2018-02-28 DIAGNOSIS — M25551 Pain in right hip: Secondary | ICD-10-CM | POA: Diagnosis not present

## 2018-02-28 DIAGNOSIS — M545 Low back pain: Secondary | ICD-10-CM | POA: Diagnosis not present

## 2018-02-28 DIAGNOSIS — M62551 Muscle wasting and atrophy, not elsewhere classified, right thigh: Secondary | ICD-10-CM | POA: Diagnosis not present

## 2018-02-28 DIAGNOSIS — M62552 Muscle wasting and atrophy, not elsewhere classified, left thigh: Secondary | ICD-10-CM | POA: Diagnosis not present

## 2018-03-03 DIAGNOSIS — M62552 Muscle wasting and atrophy, not elsewhere classified, left thigh: Secondary | ICD-10-CM | POA: Diagnosis not present

## 2018-03-03 DIAGNOSIS — M25551 Pain in right hip: Secondary | ICD-10-CM | POA: Diagnosis not present

## 2018-03-03 DIAGNOSIS — M62551 Muscle wasting and atrophy, not elsewhere classified, right thigh: Secondary | ICD-10-CM | POA: Diagnosis not present

## 2018-03-03 DIAGNOSIS — M545 Low back pain: Secondary | ICD-10-CM | POA: Diagnosis not present

## 2018-03-07 DIAGNOSIS — M62551 Muscle wasting and atrophy, not elsewhere classified, right thigh: Secondary | ICD-10-CM | POA: Diagnosis not present

## 2018-03-07 DIAGNOSIS — M25551 Pain in right hip: Secondary | ICD-10-CM | POA: Diagnosis not present

## 2018-03-07 DIAGNOSIS — M62552 Muscle wasting and atrophy, not elsewhere classified, left thigh: Secondary | ICD-10-CM | POA: Diagnosis not present

## 2018-03-07 DIAGNOSIS — M545 Low back pain: Secondary | ICD-10-CM | POA: Diagnosis not present

## 2018-03-22 ENCOUNTER — Other Ambulatory Visit: Payer: Self-pay | Admitting: Adult Health

## 2018-03-22 DIAGNOSIS — Z760 Encounter for issue of repeat prescription: Secondary | ICD-10-CM

## 2018-03-22 NOTE — Telephone Encounter (Signed)
Sent to the pharmacy by e-scribe. 

## 2018-03-23 DIAGNOSIS — M25551 Pain in right hip: Secondary | ICD-10-CM | POA: Diagnosis not present

## 2018-03-23 DIAGNOSIS — M62551 Muscle wasting and atrophy, not elsewhere classified, right thigh: Secondary | ICD-10-CM | POA: Diagnosis not present

## 2018-03-23 DIAGNOSIS — M545 Low back pain: Secondary | ICD-10-CM | POA: Diagnosis not present

## 2018-03-23 DIAGNOSIS — M62552 Muscle wasting and atrophy, not elsewhere classified, left thigh: Secondary | ICD-10-CM | POA: Diagnosis not present

## 2018-03-24 ENCOUNTER — Encounter: Payer: Self-pay | Admitting: Pulmonary Disease

## 2018-03-24 ENCOUNTER — Ambulatory Visit (INDEPENDENT_AMBULATORY_CARE_PROVIDER_SITE_OTHER): Payer: Medicare Other | Admitting: Pulmonary Disease

## 2018-03-24 VITALS — BP 120/70 | HR 51 | Ht 67.0 in | Wt 201.0 lb

## 2018-03-24 DIAGNOSIS — R05 Cough: Secondary | ICD-10-CM | POA: Diagnosis not present

## 2018-03-24 DIAGNOSIS — R059 Cough, unspecified: Secondary | ICD-10-CM

## 2018-03-24 DIAGNOSIS — G4733 Obstructive sleep apnea (adult) (pediatric): Secondary | ICD-10-CM | POA: Diagnosis not present

## 2018-03-24 NOTE — Patient Instructions (Signed)
We will make a referral to Dr. Ron Parker for the dental appliance For your sinusitis please use Zyrtec or Claritin over-the-counter and Flonase nasal spray Schedule you for spirometry in December and a follow-up in clinic after that.

## 2018-03-24 NOTE — Progress Notes (Addendum)
Ian [name]    [id number]    22-May-1941  Primary Care Physician:Nafziger, Tommi Rumps, NP  Referring Physician: Dorothyann Peng, NP [place], [place] [address]  Chief complaint: Consult for sleep apnea  HPI: 77 year old with severe sleep apnea, coronary artery disease, diabetes, hypertension, allergies.  Previously followed by Dr. Lamonte Sakai.  Last seen in the clinic in 2014 He has been diagnosed with severe sleep apnea after sleep study done in [place] in 2008.  Been tried on CPAP but is intolerant of the device and had been referred to Dr. Ron Parker for dental appliance.  Repeat sleep study on the dental device in 2014 shows reduction in AHI to 17. He had PFTs in 2014 which showed flow loops show flattening of the inspiratory loop suggestive of variable extrathoracic obstruction, vocal cord dysfunction.  Bronchoscopy was discussed but patient declined due to absence of symptoms  He returns to clinic today for follow-up, requires prescription again for dental device.  States that it is working well for him and does not want to try the CPAP again. His chief complaint is chronic sinusitis.  He had sinus surgery in 1996 which did not help with his symptoms.  He is using Allegra which is not working.  History also noted for GERD status post laproscopic Nissen's fundoplication in 9767.  He currently does not have any acid reflux symptoms and is not taking any antiacid medication  Pets: No pets Occupation: Worked as an Scientist, physiological for [employer] Exposures: No known exposures, no mold, hot tub, Jacuzzi Smoking history: Never smoker Travel history: Raised in [place].  Lived mostly in the [place] in [place], [place], [place] Relevant family history: No relevant family history of lung disease  Outpatient Encounter Medications as of 03/24/2018  Medication Sig  . aspirin 81 MG tablet Take 1 tablet (81 mg total) by mouth daily.  Marland Kitchen atorvastatin (LIPITOR) 40 MG tablet Take 1  tablet (40 mg total) by mouth daily.  . Coenzyme Q10 150 MG CAPS Take 1 capsule (150 mg total) by mouth daily.  . cyclobenzaprine (FLEXERIL) 5 MG tablet Take 1 tablet (5 mg total) by mouth 3 (three) times daily as needed for muscle spasms.  Marland Kitchen doxazosin (CARDURA) 2 MG tablet Take 1 tablet (2 mg total) by mouth at bedtime.  . fexofenadine (ALLEGRA) 180 MG tablet Take 1 tablet (180 mg total) by mouth daily.  . fluticasone (FLONASE) 50 MCG/ACT nasal spray SPRAY 2 SPRAYS INTO EACH NOSTRIL EVERY DAY  . gabapentin (NEURONTIN) 300 MG capsule Take 1-2 capsules in the evening  . glimepiride (AMARYL) 1 MG tablet Take 1 tablet (1 mg total) by mouth 2 (two) times daily.  Marland Kitchen GLUCOSAMINE-CHONDROITIN PO Take by mouth. 1500 mg glucosamine and 1200 mg of Chondroitin TWICE DAILY  . LORazepam (ATIVAN) 0.5 MG tablet Take 1 tablet (0.5 mg total) by mouth 2 (two) times daily.  . metFORMIN (GLUCOPHAGE) 500 MG tablet Take 2 tablets with AM meal and 1 tablet with PM meal  . nebivolol (BYSTOLIC) 2.5 MG tablet Take 1 tablet (2.5 mg total) by mouth daily.  Marland Kitchen olmesartan (BENICAR) 40 MG tablet Take 1 tablet (40 mg total) by mouth daily.  . Omega-3 1400 MG CAPS Take 1 capsule by mouth 2 (two) times daily.  . pantoprazole (PROTONIX) 40 MG tablet Take 1 tablet (40 mg total) by mouth daily.  . traMADol (ULTRAM) 50 MG tablet Take 1 tablet (50 mg total) by mouth every 8 (eight) hours  as needed.  . zolpidem (AMBIEN CR) 6.25 MG CR tablet Take 1 tablet (6.25 mg total) by mouth at bedtime as needed for sleep.   No facility-administered encounter medications on file as of 03/24/2018.     Allergies as of 03/24/2018 - Review Complete 03/24/2018  Allergen Reaction Noted  . Iohexol  01/06/2008    Past Medical History:  Diagnosis Date  . Allergy   . Apnea, sleep 07/28/07   NPSG [place], AHI 79.9  . Carotid artery stenosis    mild 04/2010, 05/2012  . Coronary heart disease 2008   Cath 2008, 60-70% proximal diagonal 1 stenosis,  RCA  40% stenosis.  nonischemic Lexiscan 03/2011  . Diabetes mellitus    type II  . GERD (gastroesophageal reflux disease)   . Hyperlipidemia   . Hypertension   . Hypospadias     Past Surgical History:  Procedure Laterality Date  . NASAL SINUS SURGERY    . NISSEN FUNDOPLICATION    . TONSILLECTOMY    . VEIN LIGATION AND STRIPPING      Family History  Problem Relation Age of Onset  . Coronary artery disease Father 24       CABG  . Dementia Mother     Social History   Socioeconomic History  . Marital status: Married    Spouse name: Not on file  . Number of children: 3  . Years of education: Not on file  . Highest education level: Not on file  Occupational History  . Occupation: Retired Games developer: RETIRED  Social Needs  . Financial resource strain: Not on file  . Food insecurity:    Worry: Not on file    Inability: Not on file  . Transportation needs:    Medical: Not on file    Non-medical: Not on file  Tobacco Use  . Smoking status: Never Smoker  . Smokeless tobacco: Never Used  Substance and Sexual Activity  . Alcohol use: No    Alcohol/week: 0.0 oz  . Drug use: No  . Sexual activity: Not on file  Lifestyle  . Physical activity:    Days per week: Not on file    Minutes per session: Not on file  . Stress: Not on file  Relationships  . Social connections:    Talks on phone: Not on file    Gets together: Not on file    Attends religious service: Not on file    Active member of club or organization: Not on file    Attends meetings of clubs or organizations: Not on file    Relationship status: Not on file  . Intimate partner violence:    Fear of current or ex partner: Not on file    Emotionally abused: Not on file    Physically abused: Not on file    Forced sexual activity: Not on file  Other Topics Concern  . Not on file  Social History Narrative   Married.  Three children from first marriage.  First wife died of ovarian cancer.      Retired, did work for [employer] for 44 years.     Review of systems: Review of Systems  Constitutional: Negative for fever and chills.  HENT: Negative.   Eyes: Negative for blurred vision.  Respiratory: as per HPI  Cardiovascular: Negative for chest pain and palpitations.  Gastrointestinal: Negative for vomiting, diarrhea, blood per rectum. Genitourinary: Negative for dysuria, urgency, frequency and hematuria.  Musculoskeletal: Negative for myalgias, back  pain and joint pain.  Skin: Negative for itching and rash.  Neurological: Negative for dizziness, tremors, focal weakness, seizures and loss of consciousness.  Endo/Heme/Allergies: Negative for environmental allergies.  Psychiatric/Behavioral: Negative for depression, suicidal ideas and hallucinations.  All other systems reviewed and are negative.  Physical Exam: Blood pressure 120/70, pulse (!) 51, height 5\' 7"  (1.702 m), weight 201 lb (91.2 kg), SpO2 96 %. Gen:      No acute distress HEENT:  EOMI, sclera anicteric Neck:     No masses; no thyromegaly Lungs:    Clear to auscultation bilaterally; normal respiratory effort CV:         Regular rate and rhythm; no murmurs Abd:      + bowel sounds; soft, non-tender; no palpable masses, no distension Ext:    No edema; adequate peripheral perfusion Skin:      Warm and dry; no rash Neuro: alert and oriented x 3 Psych: normal mood and affect  Data Reviewed: Sleep study 07/28/07 from [place] Severe sleep apnea with AHI 76.3  Repeat apnea testing on dental device 03/13/2013 AHI 17, oxygen desaturation index 13  PFTs 12/09/2012 FVC 3.52 [88%), FEV1 2.68 [101%], F/F 76, TLC 100%, DLCO 109% No obstruction or restriction.  Flattening of the inspiratory limb of flow loop. . Chest x-ray 12/03/1978 Chronic elevation of right hemidiaphragm with right atelectasis  Assessment:  Severe sleep apnea He has responded well to dental device with reduction in AHI to 17.  He does not want to try  CPAP again We will make a referral to Dr. Ron Parker for a new dental device Consider repeat sleep study on the dental device to make sure that the AHI is still low  Chronic sinusitis He is taking Allegra without relief.  Suggested he try Zyrtec or Claritin over-the-counter and Flonase nasal spray. Previous spirometry noted for flattening of the inspiratory loop suggestive of variable extrathoracic obstruction, vocal cord dysfunction.  We will repeat the spirometry.  If still abnormal then consider ENT referral.  Right hemidiaphragm elevation Chest x-ray noted with right hemidiaphragm elevation, paralysis of unclear etiology.  There is no record of any surgeries in the chest or neck except for laparoscopic Nissen's repair.  This has been present since at least 2009. We will continue to monitor this as it does not appear to be causing any issue.  Plan/Recommendations: - Prescription for dental device - Claritin/Zyrtec over-the-counter and Flonase nasal spray - Repeat spirometry  Marshell Garfinkel MD [facility] 03/24/2018, 9:38 AM  CC: Dorothyann Peng, NP  Addendum Received note from Dr. Ron Parker 06/06/2018 Patient has started new oral appliance therapy for OSA.  Marshell Garfinkel MD [facility] 06/22/2018, 12:32 PM

## 2018-03-25 ENCOUNTER — Other Ambulatory Visit: Payer: Self-pay | Admitting: Adult Health

## 2018-03-25 ENCOUNTER — Other Ambulatory Visit: Payer: Self-pay | Admitting: Family Medicine

## 2018-03-25 DIAGNOSIS — M62552 Muscle wasting and atrophy, not elsewhere classified, left thigh: Secondary | ICD-10-CM | POA: Diagnosis not present

## 2018-03-25 DIAGNOSIS — Z760 Encounter for issue of repeat prescription: Secondary | ICD-10-CM

## 2018-03-25 DIAGNOSIS — M545 Low back pain: Secondary | ICD-10-CM | POA: Diagnosis not present

## 2018-03-25 DIAGNOSIS — M25551 Pain in right hip: Secondary | ICD-10-CM | POA: Diagnosis not present

## 2018-03-25 DIAGNOSIS — M62551 Muscle wasting and atrophy, not elsewhere classified, right thigh: Secondary | ICD-10-CM | POA: Diagnosis not present

## 2018-03-25 MED ORDER — LORAZEPAM 0.5 MG PO TABS: 0.5000 mg | ORAL_TABLET | Freq: Two times a day (BID) | ORAL | 0 refills | Status: AC

## 2018-03-25 NOTE — Telephone Encounter (Signed)
[facility], 90 day supply company.

## 2018-03-28 DIAGNOSIS — M62551 Muscle wasting and atrophy, not elsewhere classified, right thigh: Secondary | ICD-10-CM | POA: Diagnosis not present

## 2018-03-28 DIAGNOSIS — M62552 Muscle wasting and atrophy, not elsewhere classified, left thigh: Secondary | ICD-10-CM | POA: Diagnosis not present

## 2018-03-28 DIAGNOSIS — M545 Low back pain: Secondary | ICD-10-CM | POA: Diagnosis not present

## 2018-03-28 DIAGNOSIS — M25551 Pain in right hip: Secondary | ICD-10-CM | POA: Diagnosis not present

## 2018-03-30 DIAGNOSIS — M62552 Muscle wasting and atrophy, not elsewhere classified, left thigh: Secondary | ICD-10-CM | POA: Diagnosis not present

## 2018-03-30 DIAGNOSIS — M25551 Pain in right hip: Secondary | ICD-10-CM | POA: Diagnosis not present

## 2018-03-30 DIAGNOSIS — M545 Low back pain: Secondary | ICD-10-CM | POA: Diagnosis not present

## 2018-03-30 DIAGNOSIS — M62551 Muscle wasting and atrophy, not elsewhere classified, right thigh: Secondary | ICD-10-CM | POA: Diagnosis not present

## 2018-04-01 DIAGNOSIS — M62551 Muscle wasting and atrophy, not elsewhere classified, right thigh: Secondary | ICD-10-CM | POA: Diagnosis not present

## 2018-04-01 DIAGNOSIS — M62552 Muscle wasting and atrophy, not elsewhere classified, left thigh: Secondary | ICD-10-CM | POA: Diagnosis not present

## 2018-04-01 DIAGNOSIS — M545 Low back pain: Secondary | ICD-10-CM | POA: Diagnosis not present

## 2018-04-01 DIAGNOSIS — M25551 Pain in right hip: Secondary | ICD-10-CM | POA: Diagnosis not present

## 2018-04-06 DIAGNOSIS — M545 Low back pain: Secondary | ICD-10-CM | POA: Diagnosis not present

## 2018-04-06 DIAGNOSIS — M62552 Muscle wasting and atrophy, not elsewhere classified, left thigh: Secondary | ICD-10-CM | POA: Diagnosis not present

## 2018-04-06 DIAGNOSIS — M62551 Muscle wasting and atrophy, not elsewhere classified, right thigh: Secondary | ICD-10-CM | POA: Diagnosis not present

## 2018-04-06 DIAGNOSIS — M25551 Pain in right hip: Secondary | ICD-10-CM | POA: Diagnosis not present

## 2018-04-08 DIAGNOSIS — M62551 Muscle wasting and atrophy, not elsewhere classified, right thigh: Secondary | ICD-10-CM | POA: Diagnosis not present

## 2018-04-08 DIAGNOSIS — M62552 Muscle wasting and atrophy, not elsewhere classified, left thigh: Secondary | ICD-10-CM | POA: Diagnosis not present

## 2018-04-08 DIAGNOSIS — M25551 Pain in right hip: Secondary | ICD-10-CM | POA: Diagnosis not present

## 2018-04-08 DIAGNOSIS — M545 Low back pain: Secondary | ICD-10-CM | POA: Diagnosis not present

## 2018-04-12 ENCOUNTER — Encounter: Payer: Self-pay | Admitting: Adult Health

## 2018-04-12 MED ORDER — HYDROCORTISONE 2.5 % EX CREA: TOPICAL_CREAM | Freq: Two times a day (BID) | CUTANEOUS | 3 refills | Status: DC

## 2018-04-26 DIAGNOSIS — G4733 Obstructive sleep apnea (adult) (pediatric): Secondary | ICD-10-CM | POA: Diagnosis not present

## 2018-04-26 DIAGNOSIS — I739 Peripheral vascular disease, unspecified: Secondary | ICD-10-CM | POA: Diagnosis not present

## 2018-04-26 DIAGNOSIS — E782 Mixed hyperlipidemia: Secondary | ICD-10-CM | POA: Diagnosis not present

## 2018-04-26 DIAGNOSIS — R9431 Abnormal electrocardiogram [ECG] [EKG]: Secondary | ICD-10-CM | POA: Diagnosis not present

## 2018-04-26 DIAGNOSIS — I2510 Atherosclerotic heart disease of native coronary artery without angina pectoris: Secondary | ICD-10-CM | POA: Diagnosis not present

## 2018-04-26 DIAGNOSIS — I10 Essential (primary) hypertension: Secondary | ICD-10-CM | POA: Diagnosis not present

## 2018-05-19 DIAGNOSIS — E1142 Type 2 diabetes mellitus with diabetic polyneuropathy: Secondary | ICD-10-CM | POA: Diagnosis not present

## 2018-05-19 DIAGNOSIS — M79671 Pain in right foot: Secondary | ICD-10-CM | POA: Diagnosis not present

## 2018-05-19 DIAGNOSIS — M79672 Pain in left foot: Secondary | ICD-10-CM | POA: Diagnosis not present

## 2018-05-19 DIAGNOSIS — M545 Low back pain: Secondary | ICD-10-CM | POA: Diagnosis not present

## 2018-05-19 DIAGNOSIS — I10 Essential (primary) hypertension: Secondary | ICD-10-CM | POA: Diagnosis not present

## 2018-05-19 DIAGNOSIS — G8929 Other chronic pain: Secondary | ICD-10-CM | POA: Diagnosis not present

## 2018-05-19 DIAGNOSIS — E1143 Type 2 diabetes mellitus with diabetic autonomic (poly)neuropathy: Secondary | ICD-10-CM | POA: Diagnosis not present

## 2018-05-25 ENCOUNTER — Encounter: Payer: Self-pay | Admitting: Adult Health

## 2018-06-15 ENCOUNTER — Encounter: Payer: Self-pay | Admitting: Adult Health

## 2018-06-15 ENCOUNTER — Other Ambulatory Visit: Payer: Self-pay | Admitting: Adult Health

## 2018-06-15 DIAGNOSIS — E119 Type 2 diabetes mellitus without complications: Secondary | ICD-10-CM

## 2018-06-15 DIAGNOSIS — I10 Essential (primary) hypertension: Secondary | ICD-10-CM

## 2018-06-15 DIAGNOSIS — Z760 Encounter for issue of repeat prescription: Secondary | ICD-10-CM

## 2018-06-15 MED ORDER — TRAMADOL HCL 50 MG PO TABS: 50.0000 mg | ORAL_TABLET | Freq: Three times a day (TID) | ORAL | 0 refills | Status: DC | PRN

## 2018-06-15 MED ORDER — NEBIVOLOL HCL 2.5 MG PO TABS: 2.5000 mg | ORAL_TABLET | Freq: Every day | ORAL | 1 refills | Status: DC

## 2018-06-15 MED ORDER — GLIMEPIRIDE 1 MG PO TABS: 1.0000 mg | ORAL_TABLET | Freq: Two times a day (BID) | ORAL | 1 refills | Status: DC

## 2018-06-15 MED ORDER — METFORMIN HCL 500 MG PO TABS: ORAL_TABLET | ORAL | 1 refills | Status: DC

## 2018-06-15 MED ORDER — ZOLPIDEM TARTRATE ER 6.25 MG PO TBCR: 6.2500 mg | EXTENDED_RELEASE_TABLET | Freq: Every evening | ORAL | 0 refills | Status: DC | PRN

## 2018-07-14 DIAGNOSIS — M545 Low back pain: Secondary | ICD-10-CM | POA: Diagnosis not present

## 2018-07-14 DIAGNOSIS — E1143 Type 2 diabetes mellitus with diabetic autonomic (poly)neuropathy: Secondary | ICD-10-CM | POA: Diagnosis not present

## 2018-07-14 DIAGNOSIS — I10 Essential (primary) hypertension: Secondary | ICD-10-CM | POA: Diagnosis not present

## 2018-07-14 DIAGNOSIS — G8929 Other chronic pain: Secondary | ICD-10-CM | POA: Diagnosis not present

## 2018-08-23 ENCOUNTER — Encounter: Payer: Self-pay | Admitting: Adult Health

## 2018-08-23 DIAGNOSIS — Z23 Encounter for immunization: Secondary | ICD-10-CM | POA: Diagnosis not present

## 2018-09-09 ENCOUNTER — Other Ambulatory Visit: Payer: Self-pay | Admitting: Adult Health

## 2018-09-09 ENCOUNTER — Encounter: Payer: Self-pay | Admitting: Adult Health

## 2018-09-09 MED ORDER — ECONAZOLE NITRATE 1 % EX CREA: TOPICAL_CREAM | Freq: Every day | CUTANEOUS | 2 refills | Status: DC

## 2018-09-19 DIAGNOSIS — H35372 Puckering of macula, left eye: Secondary | ICD-10-CM | POA: Diagnosis not present

## 2018-09-19 DIAGNOSIS — E119 Type 2 diabetes mellitus without complications: Secondary | ICD-10-CM | POA: Diagnosis not present

## 2018-09-19 DIAGNOSIS — H40013 Open angle with borderline findings, low risk, bilateral: Secondary | ICD-10-CM | POA: Diagnosis not present

## 2018-09-19 DIAGNOSIS — H35033 Hypertensive retinopathy, bilateral: Secondary | ICD-10-CM | POA: Diagnosis not present

## 2018-09-29 ENCOUNTER — Other Ambulatory Visit: Payer: Self-pay | Admitting: Family Medicine

## 2018-09-29 NOTE — Telephone Encounter (Signed)
Request for 90 day supply from 90 day supply company/mail order.

## 2018-10-03 MED ORDER — ZOLPIDEM TARTRATE ER 6.25 MG PO TBCR: 6.2500 mg | EXTENDED_RELEASE_TABLET | Freq: Every evening | ORAL | 0 refills | Status: DC | PRN

## 2018-10-18 ENCOUNTER — Encounter: Payer: Self-pay | Admitting: Adult Health

## 2018-10-18 ENCOUNTER — Ambulatory Visit (INDEPENDENT_AMBULATORY_CARE_PROVIDER_SITE_OTHER): Payer: Medicare Other | Admitting: Adult Health

## 2018-10-18 VITALS — BP 140/88 | Temp 97.9°F | Wt 198.0 lb

## 2018-10-18 DIAGNOSIS — F5104 Psychophysiologic insomnia: Secondary | ICD-10-CM | POA: Diagnosis not present

## 2018-10-18 DIAGNOSIS — I6523 Occlusion and stenosis of bilateral carotid arteries: Secondary | ICD-10-CM

## 2018-10-18 DIAGNOSIS — E119 Type 2 diabetes mellitus without complications: Secondary | ICD-10-CM | POA: Diagnosis not present

## 2018-10-18 LAB — POCT GLYCOSYLATED HEMOGLOBIN (HGB A1C): HbA1c, POC (controlled diabetic range): 5.6 % (ref 0.0–7.0)

## 2018-10-18 MED ORDER — ESZOPICLONE 1 MG PO TABS: 1.0000 mg | ORAL_TABLET | Freq: Every evening | ORAL | 0 refills | Status: DC | PRN

## 2018-10-18 NOTE — Addendum Note (Signed)
Addended by: Apolinar Junes on: 10/18/2018 11:22 AM   Modules accepted: Level of Service

## 2018-10-18 NOTE — Progress Notes (Signed)
Subjective:    Patient ID: Ian [name], male    DOB: October 27, 1941, 77 y.o.   MRN: [id number]  HPI  77 year old male who  has a past medical history of Allergy, Apnea, sleep (07/28/07), Carotid artery stenosis, Coronary heart disease (2008), Diabetes mellitus, GERD (gastroesophageal reflux disease), Hyperlipidemia, Hypertension, and Hypospadias.   Is in the office today for follow-up regarding diabetes mellitus.  Currently takes metformin 1000 mg in the morning and 500 mg in the evening. He does report instances of lightheadedness, this happens very rarely.  Lab Results  Component Value Date   HGBA1C 5.6 10/18/2018    Insomnia - has been taking Ambien 6.25 mg and feels as though he sleeps longer than when he takes this but does not feel restful when he wakes up and does not like the feeling of being "comatose". He has also used Ambien 2.5 -5 mg and does not sleep more than 4 hours on this medication. He does use an oral appliance for sleep apnea  Review of Systems See HPI   Past Medical History:  Diagnosis Date  . Allergy   . Apnea, sleep 07/28/07   NPSG [place], AHI 79.9  . Carotid artery stenosis    mild 04/2010, 05/2012  . Coronary heart disease 2008   Cath 2008, 60-70% proximal diagonal 1 stenosis,  RCA 40% stenosis.  nonischemic Lexiscan 03/2011  . Diabetes mellitus    type II  . GERD (gastroesophageal reflux disease)   . Hyperlipidemia   . Hypertension   . Hypospadias     Social History   Socioeconomic History  . Marital status: Married    Spouse name: Not on file  . Number of children: 3  . Years of education: Not on file  . Highest education level: Not on file  Occupational History  . Occupation: Retired Games developer: RETIRED  Social Needs  . Financial resource strain: Not on file  . Food insecurity:    Worry: Not on file    Inability: Not on file  . Transportation needs:    Medical: Not on file    Non-medical: Not on file  Tobacco Use  .  Smoking status: Never Smoker  . Smokeless tobacco: Never Used  Substance and Sexual Activity  . Alcohol use: No    Alcohol/week: 0.0 standard drinks  . Drug use: No  . Sexual activity: Not on file  Lifestyle  . Physical activity:    Days per week: Not on file    Minutes per session: Not on file  . Stress: Not on file  Relationships  . Social connections:    Talks on phone: Not on file    Gets together: Not on file    Attends religious service: Not on file    Active member of club or organization: Not on file    Attends meetings of clubs or organizations: Not on file    Relationship status: Not on file  . Intimate partner violence:    Fear of current or ex partner: Not on file    Emotionally abused: Not on file    Physically abused: Not on file    Forced sexual activity: Not on file  Other Topics Concern  . Not on file  Social History Narrative   Married.  Three children from first marriage.  First wife died of ovarian cancer.     Retired, did work for [employer] for 44 years.     Past  Surgical History:  Procedure Laterality Date  . NASAL SINUS SURGERY    . NISSEN FUNDOPLICATION    . TONSILLECTOMY    . VEIN LIGATION AND STRIPPING      Family History  Problem Relation Age of Onset  . Coronary artery disease Father 39       CABG  . Dementia Mother     Allergies  Allergen Reactions  . Iohexol      Desc: pt. states severe reaction, dr. told patient never to have the dye again     Current Outpatient Medications on File Prior to Visit  Medication Sig Dispense Refill  . aspirin 81 MG tablet Take 1 tablet (81 mg total) by mouth daily. 90 tablet 3  . atorvastatin (LIPITOR) 40 MG tablet Take 1 tablet (40 mg total) by mouth daily. 90 tablet 3  . Coenzyme Q10 150 MG CAPS Take 1 capsule (150 mg total) by mouth daily. (Patient taking differently: Take 2 capsules by mouth 2 (two) times daily. ) 90 capsule 3  . cyclobenzaprine (FLEXERIL) 5 MG tablet Take 1 tablet (5 mg  total) by mouth 3 (three) times daily as needed for muscle spasms. 90 tablet 0  . doxazosin (CARDURA) 2 MG tablet Take 1 tablet (2 mg total) by mouth at bedtime. 90 tablet 3  . econazole nitrate 1 % cream Apply topically daily. 15 g 2  . fexofenadine (ALLEGRA) 180 MG tablet Take 1 tablet (180 mg total) by mouth daily. 90 tablet 3  . fluticasone (FLONASE) 50 MCG/ACT nasal spray SPRAY 2 SPRAYS INTO EACH NOSTRIL EVERY DAY 48 g 1  . gabapentin (NEURONTIN) 300 MG capsule Take 1-2 capsules in the evening 180 capsule 3  . GLUCOSAMINE-CHONDROITIN PO Take by mouth. 1500 mg glucosamine and 1200 mg of Chondroitin TWICE DAILY    . hydrocortisone 2.5 % cream Apply topically 2 (two) times daily. 30 g 3  . metFORMIN (GLUCOPHAGE) 500 MG tablet Take 2 tablets with AM meal and 1 tablet with PM meal 270 tablet 1  . nebivolol (BYSTOLIC) 2.5 MG tablet Take 1 tablet (2.5 mg total) by mouth daily. 90 tablet 1  . olmesartan (BENICAR) 40 MG tablet Take 1 tablet (40 mg total) by mouth daily. 90 tablet 3  . Omega-3 1400 MG CAPS Take 1 capsule by mouth 2 (two) times daily.    . pantoprazole (PROTONIX) 40 MG tablet Take 1 tablet (40 mg total) by mouth daily. 30 tablet 3  . traMADol (ULTRAM) 50 MG tablet Take 1 tablet (50 mg total) by mouth every 8 (eight) hours as needed. 90 tablet 0   No current facility-administered medications on file prior to visit.     BP 140/88   Temp 97.9 F (36.6 C)   Wt 198 lb (89.8 kg)   BMI 31.01 kg/m       Objective:   Physical Exam  Constitutional: He is oriented to person, place, and time. He appears well-developed and well-nourished. No distress.  Eyes: Pupils are equal, round, and reactive to light. Conjunctivae and EOM are normal. Right eye exhibits no discharge. Left eye exhibits no discharge. No scleral icterus.  Neck: Normal range of motion. Neck supple.  Cardiovascular: Normal rate, regular rhythm, normal heart sounds and intact distal pulses.  Pulmonary/Chest: Effort normal  and breath sounds normal.  Neurological: He is alert and oriented to person, place, and time.  Skin: Skin is warm and dry. He is not diaphoretic.  Psychiatric: He has a normal mood and affect.  His behavior is normal. Judgment and thought content normal.  Nursing note and vitals reviewed.     Assessment & Plan:  1. Diabetes mellitus without complication ([place])  - POCT A1C - 5.7  - Will have him d/c Amaryl. I am wondering if low blood sugar is causing his episodic lightheadedness.  - Follow up in 3 months for CPE   2. Chronic insomnia - Will switch to 1 mg lunesta to see if he gets more restful sleep.  - Follow up as needed - eszopiclone (LUNESTA) 1 MG TABS tablet; Take 1 tablet (1 mg total) by mouth at bedtime as needed for sleep. Take immediately before bedtime  Dispense: 30 tablet; Refill: 0   Dorothyann Peng, NP

## 2018-10-19 ENCOUNTER — Other Ambulatory Visit: Payer: Self-pay | Admitting: Adult Health

## 2018-10-19 DIAGNOSIS — E119 Type 2 diabetes mellitus without complications: Secondary | ICD-10-CM

## 2018-10-21 ENCOUNTER — Other Ambulatory Visit (INDEPENDENT_AMBULATORY_CARE_PROVIDER_SITE_OTHER): Payer: Medicare Other

## 2018-10-21 DIAGNOSIS — E119 Type 2 diabetes mellitus without complications: Secondary | ICD-10-CM

## 2018-10-21 LAB — HEMOGLOBIN A1C: Hgb A1c MFr Bld: 6.2 % (ref 4.6–6.5)

## 2018-10-25 ENCOUNTER — Encounter: Payer: Self-pay | Admitting: Adult Health

## 2018-11-17 ENCOUNTER — Encounter: Payer: Self-pay | Admitting: Adult Health

## 2018-11-17 ENCOUNTER — Ambulatory Visit (INDEPENDENT_AMBULATORY_CARE_PROVIDER_SITE_OTHER): Payer: Medicare Other | Admitting: Adult Health

## 2018-11-17 VITALS — BP 126/64 | Temp 98.1°F | Wt 197.0 lb

## 2018-11-17 DIAGNOSIS — Z760 Encounter for issue of repeat prescription: Secondary | ICD-10-CM | POA: Diagnosis not present

## 2018-11-17 DIAGNOSIS — F5104 Psychophysiologic insomnia: Secondary | ICD-10-CM | POA: Diagnosis not present

## 2018-11-17 DIAGNOSIS — B49 Unspecified mycosis: Secondary | ICD-10-CM | POA: Diagnosis not present

## 2018-11-17 MED ORDER — ESZOPICLONE 1 MG PO TABS: 1.0000 mg | ORAL_TABLET | Freq: Every evening | ORAL | 0 refills | Status: DC | PRN

## 2018-11-17 MED ORDER — NYSTATIN 100000 UNIT/GM EX CREA: 1.0000 "application " | TOPICAL_CREAM | Freq: Two times a day (BID) | CUTANEOUS | 0 refills | Status: DC

## 2018-11-17 MED ORDER — FLUTICASONE PROPIONATE 50 MCG/ACT NA SUSP: NASAL | 1 refills | Status: DC

## 2018-11-17 NOTE — Progress Notes (Signed)
Subjective:    Patient ID: Ian [name], male    DOB: 1941-01-23, 78 y.o.   MRN: [id number]  HPI  78 year old male who  has a past medical history of Allergy, Apnea, sleep (07/28/07), Carotid artery stenosis, Coronary heart disease (2008), Diabetes mellitus, GERD (gastroesophageal reflux disease), Hyperlipidemia, Hypertension, and Hypospadias.  He presents to the office today for an acute issue of burning sensation around his buttocks. Has been present for about 7-10 days. He is concerned for skin break down. Has not noticed any drainage in his underwear   Also needs medication refills   Review of Systems See HPI   Past Medical History:  Diagnosis Date  . Allergy   . Apnea, sleep 07/28/07   NPSG [place], AHI 79.9  . Carotid artery stenosis    mild 04/2010, 05/2012  . Coronary heart disease 2008   Cath 2008, 60-70% proximal diagonal 1 stenosis,  RCA 40% stenosis.  nonischemic Lexiscan 03/2011  . Diabetes mellitus    type II  . GERD (gastroesophageal reflux disease)   . Hyperlipidemia   . Hypertension   . Hypospadias     Social History   Socioeconomic History  . Marital status: Married    Spouse name: Not on file  . Number of children: 3  . Years of education: Not on file  . Highest education level: Not on file  Occupational History  . Occupation: Retired Games developer: RETIRED  Social Needs  . Financial resource strain: Not on file  . Food insecurity:    Worry: Not on file    Inability: Not on file  . Transportation needs:    Medical: Not on file    Non-medical: Not on file  Tobacco Use  . Smoking status: Never Smoker  . Smokeless tobacco: Never Used  Substance and Sexual Activity  . Alcohol use: No    Alcohol/week: 0.0 standard drinks  . Drug use: No  . Sexual activity: Not on file  Lifestyle  . Physical activity:    Days per week: Not on file    Minutes per session: Not on file  . Stress: Not on file  Relationships  . Social connections:      Talks on phone: Not on file    Gets together: Not on file    Attends religious service: Not on file    Active member of club or organization: Not on file    Attends meetings of clubs or organizations: Not on file    Relationship status: Not on file  . Intimate partner violence:    Fear of current or ex partner: Not on file    Emotionally abused: Not on file    Physically abused: Not on file    Forced sexual activity: Not on file  Other Topics Concern  . Not on file  Social History Narrative   Married.  Three children from first marriage.  First wife died of ovarian cancer.     Retired, did work for [employer] for 44 years.     Past Surgical History:  Procedure Laterality Date  . NASAL SINUS SURGERY    . NISSEN FUNDOPLICATION    . TONSILLECTOMY    . VEIN LIGATION AND STRIPPING      Family History  Problem Relation Age of Onset  . Coronary artery disease Father 7       CABG  . Dementia Mother     Allergies  Allergen Reactions  .  Iohexol      Desc: pt. states severe reaction, dr. told patient never to have the dye again     Current Outpatient Medications on File Prior to Visit  Medication Sig Dispense Refill  . aspirin 81 MG tablet Take 1 tablet (81 mg total) by mouth daily. 90 tablet 3  . atorvastatin (LIPITOR) 40 MG tablet Take 1 tablet (40 mg total) by mouth daily. 90 tablet 3  . Coenzyme Q10 150 MG CAPS Take 1 capsule (150 mg total) by mouth daily. (Patient taking differently: Take 2 capsules by mouth 2 (two) times daily. ) 90 capsule 3  . cyclobenzaprine (FLEXERIL) 5 MG tablet Take 1 tablet (5 mg total) by mouth 3 (three) times daily as needed for muscle spasms. 90 tablet 0  . doxazosin (CARDURA) 2 MG tablet Take 1 tablet (2 mg total) by mouth at bedtime. 90 tablet 3  . econazole nitrate 1 % cream Apply topically daily. 15 g 2  . fexofenadine (ALLEGRA) 180 MG tablet Take 1 tablet (180 mg total) by mouth daily. 90 tablet 3  . gabapentin (NEURONTIN) 300 MG  capsule Take 1-2 capsules in the evening 180 capsule 3  . GLUCOSAMINE-CHONDROITIN PO Take by mouth. 1500 mg glucosamine and 1200 mg of Chondroitin TWICE DAILY    . hydrocortisone 2.5 % cream Apply topically 2 (two) times daily. 30 g 3  . metFORMIN (GLUCOPHAGE) 500 MG tablet Take 2 tablets with AM meal and 1 tablet with PM meal 270 tablet 1  . nebivolol (BYSTOLIC) 2.5 MG tablet Take 1 tablet (2.5 mg total) by mouth daily. 90 tablet 1  . olmesartan (BENICAR) 40 MG tablet Take 1 tablet (40 mg total) by mouth daily. 90 tablet 3  . Omega-3 1400 MG CAPS Take 1 capsule by mouth 2 (two) times daily.    . pantoprazole (PROTONIX) 40 MG tablet Take 1 tablet (40 mg total) by mouth daily. 30 tablet 3  . traMADol (ULTRAM) 50 MG tablet Take 1 tablet (50 mg total) by mouth every 8 (eight) hours as needed. 90 tablet 0   No current facility-administered medications on file prior to visit.     BP 126/64   Temp 98.1 F (36.7 C)   Wt 197 lb (89.4 kg)   BMI 30.85 kg/m       Objective:   Physical Exam Vitals signs and nursing note reviewed.  Constitutional:      Appearance: Normal appearance.  Musculoskeletal: Normal range of motion.  Skin:    Capillary Refill: Capillary refill takes less than 2 seconds.     Findings: Rash present.     Comments: Red dry rash noted in gluteal cleft. No skin break down   Neurological:     General: No focal deficit present.     Mental Status: He is alert and oriented to person, place, and time.       Assessment & Plan:  1. Fungal infection - apply think layer twice a day  - nystatin cream (MYCOSTATIN); Apply 1 application topically 2 (two) times daily.  Dispense: 30 g; Refill: 0  2. Chronic insomnia  - eszopiclone (LUNESTA) 1 MG TABS tablet; Take 1 tablet (1 mg total) by mouth at bedtime as needed for sleep. Take immediately before bedtime  Dispense: 90 tablet; Refill: 0  3. Medication refill  - eszopiclone (LUNESTA) 1 MG TABS tablet; Take 1 tablet (1 mg  total) by mouth at bedtime as needed for sleep. Take immediately before bedtime  Dispense: 90 tablet;  Refill: 0 - fluticasone (FLONASE) 50 MCG/ACT nasal spray; SPRAY 2 SPRAYS INTO EACH NOSTRIL EVERY DAY  Dispense: 48 g; Refill: 1  Dorothyann Peng, NP

## 2018-11-24 ENCOUNTER — Encounter: Payer: Self-pay | Admitting: Adult Health

## 2018-12-12 ENCOUNTER — Other Ambulatory Visit: Payer: Self-pay | Admitting: Adult Health

## 2018-12-12 ENCOUNTER — Telehealth: Payer: Self-pay | Admitting: Cardiology

## 2018-12-12 NOTE — Telephone Encounter (Signed)
Message fwd to Dr.Hochrein's MA, Nya.

## 2018-12-12 NOTE — Telephone Encounter (Signed)
Called patient, he wanted to make sure we could see his PCP office note, and lab work before that appointment. I advised that Dorothyann Peng, NP was apart of [facility] and we could see that information for his next OV with Dr.Hochrein.  Patient verbalized understanding, had no other questions was thankful for the call back.

## 2018-12-12 NOTE — Telephone Encounter (Signed)
New Message:    Pt is scheduled to see Dr Percival Spanish on 02-10-19. His question is, does he need any type of test before his appointment?

## 2018-12-13 NOTE — Telephone Encounter (Signed)
DENIED.  PT IS NO LONGER TAKING THIS MEDICATION. 

## 2018-12-14 ENCOUNTER — Encounter: Payer: Self-pay | Admitting: Adult Health

## 2018-12-14 DIAGNOSIS — Z760 Encounter for issue of repeat prescription: Secondary | ICD-10-CM

## 2018-12-14 DIAGNOSIS — E119 Type 2 diabetes mellitus without complications: Secondary | ICD-10-CM

## 2018-12-15 MED ORDER — METFORMIN HCL 500 MG PO TABS: ORAL_TABLET | ORAL | 0 refills | Status: DC

## 2019-01-10 ENCOUNTER — Encounter: Payer: Self-pay | Admitting: Adult Health

## 2019-01-10 ENCOUNTER — Other Ambulatory Visit: Payer: Self-pay | Admitting: Family Medicine

## 2019-01-10 DIAGNOSIS — M519 Unspecified thoracic, thoracolumbar and lumbosacral intervertebral disc disorder: Secondary | ICD-10-CM

## 2019-02-02 ENCOUNTER — Encounter: Payer: Self-pay | Admitting: Adult Health

## 2019-02-02 ENCOUNTER — Encounter: Payer: Medicare Other | Admitting: Adult Health

## 2019-02-03 ENCOUNTER — Other Ambulatory Visit: Payer: Self-pay | Admitting: Adult Health

## 2019-02-03 DIAGNOSIS — Z760 Encounter for issue of repeat prescription: Secondary | ICD-10-CM

## 2019-02-03 DIAGNOSIS — N401 Enlarged prostate with lower urinary tract symptoms: Secondary | ICD-10-CM

## 2019-02-03 DIAGNOSIS — E119 Type 2 diabetes mellitus without complications: Secondary | ICD-10-CM

## 2019-02-03 DIAGNOSIS — I10 Essential (primary) hypertension: Secondary | ICD-10-CM

## 2019-02-03 DIAGNOSIS — I2510 Atherosclerotic heart disease of native coronary artery without angina pectoris: Secondary | ICD-10-CM

## 2019-02-03 DIAGNOSIS — N138 Other obstructive and reflux uropathy: Secondary | ICD-10-CM

## 2019-02-03 MED ORDER — METFORMIN HCL 500 MG PO TABS: ORAL_TABLET | ORAL | 1 refills | Status: DC

## 2019-02-03 MED ORDER — NEBIVOLOL HCL 2.5 MG PO TABS: 2.5000 mg | ORAL_TABLET | Freq: Every day | ORAL | 3 refills | Status: DC

## 2019-02-03 MED ORDER — TRAMADOL HCL 50 MG PO TABS: 50.0000 mg | ORAL_TABLET | Freq: Three times a day (TID) | ORAL | 0 refills | Status: DC | PRN

## 2019-02-03 MED ORDER — DOXAZOSIN MESYLATE 2 MG PO TABS: 2.0000 mg | ORAL_TABLET | Freq: Every day | ORAL | 3 refills | Status: DC

## 2019-02-03 MED ORDER — ATORVASTATIN CALCIUM 40 MG PO TABS: 40.0000 mg | ORAL_TABLET | Freq: Every day | ORAL | 3 refills | Status: DC

## 2019-02-03 MED ORDER — OLMESARTAN MEDOXOMIL 40 MG PO TABS: 40.0000 mg | ORAL_TABLET | Freq: Every day | ORAL | 0 refills | Status: DC

## 2019-02-03 MED ORDER — ZOLPIDEM TARTRATE ER 6.25 MG PO TBCR: 6.2500 mg | EXTENDED_RELEASE_TABLET | Freq: Every evening | ORAL | 0 refills | Status: DC | PRN

## 2019-02-03 MED ORDER — DOXYCYCLINE HYCLATE 100 MG PO CAPS: 100.0000 mg | ORAL_CAPSULE | Freq: Two times a day (BID) | ORAL | 0 refills | Status: DC

## 2019-02-03 MED ORDER — GABAPENTIN 300 MG PO CAPS: ORAL_CAPSULE | ORAL | 3 refills | Status: DC

## 2019-02-10 ENCOUNTER — Ambulatory Visit: Payer: Medicare Other | Admitting: Cardiology

## 2019-03-02 ENCOUNTER — Encounter: Payer: Self-pay | Admitting: Adult Health

## 2019-03-03 ENCOUNTER — Encounter: Payer: Medicare Other | Admitting: Adult Health

## 2019-03-07 MED ORDER — BLOOD GLUCOSE MONITOR KIT: PACK | 0 refills | Status: DC

## 2019-03-09 ENCOUNTER — Encounter: Payer: Self-pay | Admitting: Adult Health

## 2019-03-10 ENCOUNTER — Encounter: Payer: Self-pay | Admitting: Adult Health

## 2019-03-10 MED ORDER — BLOOD GLUCOSE MONITOR KIT: PACK | 0 refills | Status: DC

## 2019-03-20 ENCOUNTER — Encounter: Payer: Self-pay | Admitting: Adult Health

## 2019-03-21 MED ORDER — ONETOUCH ULTRASOFT LANCETS MISC: 3 refills | Status: AC

## 2019-03-31 ENCOUNTER — Encounter

## 2019-03-31 ENCOUNTER — Ambulatory Visit: Payer: Medicare Other | Admitting: Cardiology

## 2019-04-09 ENCOUNTER — Encounter: Payer: Self-pay | Admitting: Adult Health

## 2019-04-13 ENCOUNTER — Encounter: Payer: Self-pay | Admitting: Adult Health

## 2019-04-26 ENCOUNTER — Encounter: Payer: Self-pay | Admitting: Adult Health

## 2019-05-08 ENCOUNTER — Encounter: Payer: Self-pay | Admitting: Adult Health

## 2019-05-18 ENCOUNTER — Other Ambulatory Visit: Payer: Self-pay | Admitting: Family Medicine

## 2019-05-18 MED ORDER — TRAMADOL HCL 50 MG PO TABS: 50.0000 mg | ORAL_TABLET | Freq: Three times a day (TID) | ORAL | 0 refills | Status: DC | PRN

## 2019-05-23 LAB — HEPATIC FUNCTION PANEL
ALT: 16 (ref 10–40)
AST: 25 (ref 14–40)
Alkaline Phosphatase: 53 (ref 25–125)

## 2019-05-23 LAB — LIPID PANEL
Cholesterol: 148 (ref 0–200)
HDL: 40 (ref 35–70)
LDL Cholesterol: 88
Triglycerides: 99 (ref 40–160)

## 2019-05-23 LAB — BASIC METABOLIC PANEL
BUN: 17 (ref 4–21)
Creatinine: 0.9 (ref 0.6–1.3)
Glucose: 99
Potassium: 4.2 (ref 3.4–5.3)
Sodium: 140 (ref 137–147)

## 2019-05-23 LAB — TSH: TSH: 0.62 (ref 0.41–5.90)

## 2019-05-23 LAB — CBC AND DIFFERENTIAL
HCT: 41 (ref 41–53)
Hemoglobin: 13.7 (ref 13.5–17.5)
Platelets: 200 (ref 150–399)
WBC: 7.3

## 2019-05-23 LAB — HEMOGLOBIN A1C: Hemoglobin A1C: 6.4

## 2019-05-24 LAB — VITAMIN D 25 HYDROXY (VIT D DEFICIENCY, FRACTURES): Vit D, 25-Hydroxy: 35

## 2019-05-29 ENCOUNTER — Encounter: Payer: Self-pay | Admitting: Adult Health

## 2019-05-30 ENCOUNTER — Encounter: Payer: Medicare Other | Admitting: Adult Health

## 2019-06-02 ENCOUNTER — Other Ambulatory Visit: Payer: Self-pay | Admitting: Adult Health

## 2019-06-02 MED ORDER — ZOLPIDEM TARTRATE ER 6.25 MG PO TBCR: 6.2500 mg | EXTENDED_RELEASE_TABLET | Freq: Every evening | ORAL | 0 refills | Status: DC | PRN

## 2019-06-16 ENCOUNTER — Encounter: Payer: Self-pay | Admitting: Family Medicine

## 2019-07-04 ENCOUNTER — Other Ambulatory Visit: Payer: Self-pay | Admitting: Adult Health

## 2019-07-04 ENCOUNTER — Encounter: Payer: Self-pay | Admitting: Adult Health

## 2019-07-04 MED ORDER — NYSTATIN 100000 UNIT/GM EX CREA: 1.0000 "application " | TOPICAL_CREAM | Freq: Two times a day (BID) | CUTANEOUS | 0 refills | Status: DC

## 2019-07-19 ENCOUNTER — Encounter: Payer: Self-pay | Admitting: Adult Health

## 2019-08-08 ENCOUNTER — Encounter: Payer: Self-pay | Admitting: Adult Health

## 2019-08-29 ENCOUNTER — Telehealth: Payer: Self-pay | Admitting: Cardiology

## 2019-08-29 NOTE — Telephone Encounter (Signed)
Please advise if patient can have lab work, if so, what to order? Thank you!   Appointment 11/13,

## 2019-08-29 NOTE — Telephone Encounter (Signed)
He had labs in July.  No further tests indicated.

## 2019-08-29 NOTE — Telephone Encounter (Signed)
  Patient would like to know if Dr Percival Spanish will want him to do any lab work before his appt on 09/22/19. If so, please let patient know so he can make sure to get that done.

## 2019-08-30 NOTE — Telephone Encounter (Signed)
Spoke with pt, aware of dr hochrein's recommendations. ?

## 2019-08-31 ENCOUNTER — Encounter: Payer: Self-pay | Admitting: Adult Health

## 2019-09-01 ENCOUNTER — Encounter: Payer: Self-pay | Admitting: Adult Health

## 2019-09-06 ENCOUNTER — Encounter: Payer: Self-pay | Admitting: Adult Health

## 2019-09-08 ENCOUNTER — Encounter: Payer: Self-pay | Admitting: Adult Health

## 2019-09-08 ENCOUNTER — Ambulatory Visit (INDEPENDENT_AMBULATORY_CARE_PROVIDER_SITE_OTHER): Payer: Medicare Other | Admitting: Adult Health

## 2019-09-08 ENCOUNTER — Other Ambulatory Visit: Payer: Self-pay

## 2019-09-08 ENCOUNTER — Ambulatory Visit (INDEPENDENT_AMBULATORY_CARE_PROVIDER_SITE_OTHER): Payer: Medicare Other

## 2019-09-08 VITALS — BP 134/60 | Temp 97.1°F | Ht 66.0 in | Wt 185.0 lb

## 2019-09-08 DIAGNOSIS — N138 Other obstructive and reflux uropathy: Secondary | ICD-10-CM

## 2019-09-08 DIAGNOSIS — I10 Essential (primary) hypertension: Secondary | ICD-10-CM | POA: Diagnosis not present

## 2019-09-08 DIAGNOSIS — N401 Enlarged prostate with lower urinary tract symptoms: Secondary | ICD-10-CM | POA: Diagnosis not present

## 2019-09-08 DIAGNOSIS — F5104 Psychophysiologic insomnia: Secondary | ICD-10-CM

## 2019-09-08 DIAGNOSIS — E1142 Type 2 diabetes mellitus with diabetic polyneuropathy: Secondary | ICD-10-CM | POA: Diagnosis not present

## 2019-09-08 DIAGNOSIS — I2510 Atherosclerotic heart disease of native coronary artery without angina pectoris: Secondary | ICD-10-CM | POA: Diagnosis not present

## 2019-09-08 DIAGNOSIS — Z760 Encounter for issue of repeat prescription: Secondary | ICD-10-CM

## 2019-09-08 DIAGNOSIS — E782 Mixed hyperlipidemia: Secondary | ICD-10-CM

## 2019-09-08 DIAGNOSIS — R42 Dizziness and giddiness: Secondary | ICD-10-CM

## 2019-09-08 DIAGNOSIS — R0602 Shortness of breath: Secondary | ICD-10-CM | POA: Diagnosis not present

## 2019-09-08 DIAGNOSIS — E119 Type 2 diabetes mellitus without complications: Secondary | ICD-10-CM | POA: Diagnosis not present

## 2019-09-08 LAB — CBC WITH DIFFERENTIAL/PLATELET
Basophils Absolute: 0 10*3/uL (ref 0.0–0.1)
Basophils Relative: 0.7 % (ref 0.0–3.0)
Eosinophils Absolute: 0.4 10*3/uL (ref 0.0–0.7)
Eosinophils Relative: 6.3 % — ABNORMAL HIGH (ref 0.0–5.0)
HCT: 42.9 % (ref 39.0–52.0)
Hemoglobin: 14.3 g/dL (ref 13.0–17.0)
Lymphocytes Relative: 26.5 % (ref 12.0–46.0)
Lymphs Abs: 1.6 10*3/uL (ref 0.7–4.0)
MCHC: 33.3 g/dL (ref 30.0–36.0)
MCV: 88.7 fl (ref 78.0–100.0)
Monocytes Absolute: 0.4 10*3/uL (ref 0.1–1.0)
Monocytes Relative: 6.9 % (ref 3.0–12.0)
Neutro Abs: 3.6 10*3/uL (ref 1.4–7.7)
Neutrophils Relative %: 59.6 % (ref 43.0–77.0)
Platelets: 210 10*3/uL (ref 150.0–400.0)
RBC: 4.84 Mil/uL (ref 4.22–5.81)
RDW: 13.6 % (ref 11.5–15.5)
WBC: 6.1 10*3/uL (ref 4.0–10.5)

## 2019-09-08 LAB — COMPREHENSIVE METABOLIC PANEL
ALT: 14 U/L (ref 0–53)
AST: 19 U/L (ref 0–37)
Albumin: 4.4 g/dL (ref 3.5–5.2)
Alkaline Phosphatase: 50 U/L (ref 39–117)
BUN: 24 mg/dL — ABNORMAL HIGH (ref 6–23)
CO2: 29 mEq/L (ref 19–32)
Calcium: 9.4 mg/dL (ref 8.4–10.5)
Chloride: 104 mEq/L (ref 96–112)
Creatinine, Ser: 1.18 mg/dL (ref 0.40–1.50)
GFR: 59.35 mL/min — ABNORMAL LOW (ref >60.00)
Glucose, Bld: 135 mg/dL — ABNORMAL HIGH (ref 70–99)
Potassium: 4.3 mEq/L (ref 3.5–5.1)
Sodium: 140 mEq/L (ref 135–145)
Total Bilirubin: 1.2 mg/dL (ref 0.2–1.2)
Total Protein: 6.7 g/dL (ref 6.0–8.3)

## 2019-09-08 LAB — LIPID PANEL
Cholesterol: 144 mg/dL (ref 0–200)
HDL: 43.6 mg/dL (ref >39.00)
LDL Cholesterol: 82 mg/dL (ref 0–99)
NonHDL: 100.67
Total CHOL/HDL Ratio: 3
Triglycerides: 94 mg/dL (ref 0.0–149.0)
VLDL: 18.8 mg/dL (ref 0.0–40.0)

## 2019-09-08 LAB — TSH: TSH: 0.67 u[IU]/mL (ref 0.35–4.50)

## 2019-09-08 LAB — HEMOGLOBIN A1C: Hgb A1c MFr Bld: 6.8 % — ABNORMAL HIGH (ref 4.6–6.5)

## 2019-09-08 MED ORDER — ATORVASTATIN CALCIUM 40 MG PO TABS: 40.0000 mg | ORAL_TABLET | Freq: Every day | ORAL | 3 refills | Status: DC

## 2019-09-08 MED ORDER — LORAZEPAM 0.5 MG PO TABS: 0.5000 mg | ORAL_TABLET | Freq: Once | ORAL | 1 refills | Status: DC | PRN

## 2019-09-08 MED ORDER — ZOLPIDEM TARTRATE 5 MG PO TABS: 5.0000 mg | ORAL_TABLET | Freq: Every evening | ORAL | 0 refills | Status: DC | PRN

## 2019-09-08 MED ORDER — TRAMADOL HCL 50 MG PO TABS: 50.0000 mg | ORAL_TABLET | Freq: Three times a day (TID) | ORAL | 0 refills | Status: DC | PRN

## 2019-09-08 MED ORDER — METFORMIN HCL 500 MG PO TABS: ORAL_TABLET | ORAL | 1 refills | Status: DC

## 2019-09-08 MED ORDER — DOXAZOSIN MESYLATE 2 MG PO TABS: 2.0000 mg | ORAL_TABLET | Freq: Every day | ORAL | 3 refills | Status: DC

## 2019-09-08 MED ORDER — OLMESARTAN MEDOXOMIL 20 MG PO TABS: 20.0000 mg | ORAL_TABLET | Freq: Every day | ORAL | 3 refills | Status: DC

## 2019-09-08 MED ORDER — GABAPENTIN 300 MG PO CAPS: ORAL_CAPSULE | ORAL | 3 refills | Status: DC

## 2019-09-08 NOTE — Progress Notes (Signed)
Subjective:    Patient ID: Ian [name], male    DOB: 1941-06-15, 78 y.o.   MRN: [id number]  HPI Patient presents for yearly preventative medicine examination. He is a pleasant 78 year old male who  has a past medical history of Allergy, Apnea, sleep (07/28/07), Carotid artery stenosis, Coronary heart disease (2008), Diabetes mellitus, GERD (gastroesophageal reflux disease), Hyperlipidemia, Hypertension, and Hypospadias.   He returns from [place] where he spends his summer at his [place].  Lightheadedness - While in [place] he was seen by his cardiologist, he had some periods of lightheadedness.  They were not typically related to change in position, he did not have syncopal episodes.  His exercise capacity had been adequate to perform his daily activities. Despite cutting Benicar from 40 to 20 mg and Bystolic from 5 to 2.5 mg daily he remained bradycardic with periods of lightheadedness.  Cardiology discontinued Bystolic.  He reports that he continues to have intermittent episodes of lightheadedness.  Today he feels as though the lightheadedness happens more when he turns his head left or right and sometimes when he gets up from a sitting position.  He does not feel as though the room is spinning around him.  He did have a carotid ultrasound done while in [place] which showed mild bilateral ICA occlusive disease with bilateral anterograde vertebral flow  Hypertension-currently taking Benicar 20 mg BP Readings from Last 3 Encounters:  09/08/19 134/60  11/17/18 126/64  10/18/18 140/88    Diabetes mellitus-takes Metformin 1000 mg in the morning and 500 mg in the evening.  He does have some mild diabetic neuropathy for which he takes gabapentin Lab Results  Component Value Date   HGBA1C 6.4 05/23/2019    Hyperlipidemia -takes Lipitor 40 mg daily as well as an aspirin 81 mg.  He denies myalgia or fatigue Lab Results  Component Value Date   CHOL 148 05/23/2019   HDL 40 05/23/2019   LDLCALC 88 05/23/2019   TRIG 99 05/23/2019   CHOLHDL 4 01/28/2018   CAD -he had a cath in June 2008 which revealed LM 30% distal tapering.  CX with mild luminal irregularities.  LAD tapered after small to moderate diagonal with moderate tortuosity.  D1 was 2 mm with 60 to 70% proximal stenosis.  RCA was small and nondominant.  He was treated medically  GERD - controlled with Protonix 40 mg daily   Insomnia -takes Ambien 5 mg QHS PRN  OSA-cannot tolerate CPAP.  He uses a dental appliance  Mild Shortness of breath -he feels as though he often has to take a deep breath and he feels as though he is not getting enough oxygen.  He denies chest pain, wheezing, cough, or feeling of chest congestion.  His symptoms have been present for approximately 6 to 8 weeks.  He does take Allegra daily for seasonal allergies and denies any symptoms.  No DOE  All immunizations and health maintenance protocols were reviewed with the patient and needed orders were placed. UTD  Appropriate screening laboratory values were ordered for the patient including screening of hyperlipidemia, renal function and hepatic function. If indicated by BPH, a PSA was ordered.  Medication reconciliation,  past medical history, social history, problem list and allergies were reviewed in detail with the patient  Goals were established with regard to weight loss, exercise, and  diet in compliance with medications  End of life planning was discussed.  He is no longer in need of a colonoscopy.  He does  participate in routine dental and vision screens   Review of Systems  Constitutional: Negative.   HENT: Negative.   Eyes: Negative.   Respiratory: Positive for shortness of breath.   Cardiovascular: Negative.   Gastrointestinal: Negative.   Endocrine: Negative.   Genitourinary: Negative.   Musculoskeletal: Positive for arthralgias and back pain.  Skin: Negative.   Allergic/Immunologic: Negative.   Neurological: Positive for  light-headedness.  Hematological: Negative.   Psychiatric/Behavioral: Negative.   All other systems reviewed and are negative.  Past Medical History:  Diagnosis Date  . Allergy   . Apnea, sleep 07/28/07   NPSG [place], AHI 79.9  . Carotid artery stenosis    mild 04/2010, 05/2012  . Coronary heart disease 2008   Cath 2008, 60-70% proximal diagonal 1 stenosis,  RCA 40% stenosis.  nonischemic Lexiscan 03/2011  . Diabetes mellitus    type II  . GERD (gastroesophageal reflux disease)   . Hyperlipidemia   . Hypertension   . Hypospadias     Social History   Socioeconomic History  . Marital status: Married    Spouse name: Not on file  . Number of children: 3  . Years of education: Not on file  . Highest education level: Not on file  Occupational History  . Occupation: Retired Games developer: RETIRED  Social Needs  . Financial resource strain: Not on file  . Food insecurity    Worry: Not on file    Inability: Not on file  . Transportation needs    Medical: Not on file    Non-medical: Not on file  Tobacco Use  . Smoking status: Never Smoker  . Smokeless tobacco: Never Used  Substance and Sexual Activity  . Alcohol use: No    Alcohol/week: 0.0 standard drinks  . Drug use: No  . Sexual activity: Not on file  Lifestyle  . Physical activity    Days per week: Not on file    Minutes per session: Not on file  . Stress: Not on file  Relationships  . Social Herbalist on phone: Not on file    Gets together: Not on file    Attends religious service: Not on file    Active member of club or organization: Not on file    Attends meetings of clubs or organizations: Not on file    Relationship status: Not on file  . Intimate partner violence    Fear of current or ex partner: Not on file    Emotionally abused: Not on file    Physically abused: Not on file    Forced sexual activity: Not on file  Other Topics Concern  . Not on file  Social History Narrative    Married.  Three children from first marriage.  First wife died of ovarian cancer.     Retired, did work for [employer] for 44 years.     Past Surgical History:  Procedure Laterality Date  . NASAL SINUS SURGERY    . NISSEN FUNDOPLICATION    . TONSILLECTOMY    . VEIN LIGATION AND STRIPPING      Family History  Problem Relation Age of Onset  . Coronary artery disease Father 24       CABG  . Dementia Mother     Allergies  Allergen Reactions  . Iohexol      Desc: pt. states severe reaction, dr. told patient never to have the dye again     Current Outpatient  Medications on File Prior to Visit  Medication Sig Dispense Refill  . aspirin 81 MG tablet Take 1 tablet (81 mg total) by mouth daily. 90 tablet 3  . atorvastatin (LIPITOR) 40 MG tablet Take 1 tablet (40 mg total) by mouth daily. 90 tablet 3  . B Complex Vitamins (VITAMIN B COMPLEX PO) Take by mouth.    . Coenzyme Q10 (CO Q10) 100 MG CAPS Take 1 tablet by mouth daily.    Marland Kitchen doxazosin (CARDURA) 2 MG tablet Take 1 tablet (2 mg total) by mouth at bedtime. 90 tablet 3  . econazole nitrate 1 % cream Apply topically daily. 15 g 2  . fexofenadine (ALLEGRA) 180 MG tablet Take 1 tablet (180 mg total) by mouth daily. 90 tablet 3  . gabapentin (NEURONTIN) 300 MG capsule Take 1-2 capsules in the evening 180 capsule 3  . GLUCOSAMINE-CHONDROITIN PO Take by mouth. 1500 mg glucosamine and 1200 mg of Chondroitin TWICE DAILY    . hydrocortisone 2.5 % cream Apply topically 2 (two) times daily. 30 g 3  . LORazepam (ATIVAN) 0.5 MG tablet Take 0.5 mg by mouth once as needed for anxiety.    . metFORMIN (GLUCOPHAGE) 500 MG tablet Take 2 tablets with AM meal and 1 tablet with PM meal 270 tablet 1  . olmesartan (BENICAR) 20 MG tablet Take 20 mg by mouth daily.    . Omega-3 Fatty Acids (FISH OIL ULTRA) 1400 MG CAPS Take by mouth.    . traMADol (ULTRAM) 50 MG tablet Take 1 tablet (50 mg total) by mouth every 8 (eight) hours as needed. 90 tablet 0   . zolpidem (AMBIEN) 5 MG tablet Take 5 mg by mouth at bedtime as needed for sleep.    . Lancets (ONETOUCH ULTRASOFT) lancets USE TO TEST BLOOD GLUCOSE TWICE DAILY 200 each 3   No current facility-administered medications on file prior to visit.     BP 134/60   Temp (!) 97.1 F (36.2 C) (Temporal)   Ht 5\' 6"  (1.676 m)   Wt 185 lb (83.9 kg)   BMI 29.86 kg/m       Objective:   Physical Exam Vitals signs and nursing note reviewed.  Constitutional:      Appearance: Normal appearance. He is normal weight.  HENT:     Head: Normocephalic and atraumatic.     Right Ear: Tympanic membrane, ear canal and external ear normal. There is no impacted cerumen.     Left Ear: Tympanic membrane, ear canal and external ear normal. There is no impacted cerumen.     Nose: Nose normal. No congestion or rhinorrhea.     Mouth/Throat:     Mouth: Mucous membranes are dry.     Pharynx: Oropharynx is clear. No oropharyngeal exudate or posterior oropharyngeal erythema.  Eyes:     Extraocular Movements: Extraocular movements intact.     Conjunctiva/sclera: Conjunctivae normal.     Pupils: Pupils are equal, round, and reactive to light.  Neck:     Musculoskeletal: Normal range of motion and neck supple.     Vascular: No carotid bruit.  Cardiovascular:     Rate and Rhythm: Normal rate and regular rhythm.     Pulses: Normal pulses.     Heart sounds: Normal heart sounds. No murmur. No friction rub. No gallop.   Pulmonary:     Effort: Pulmonary effort is normal. No respiratory distress.     Breath sounds: Normal breath sounds. No stridor. No wheezing, rhonchi or rales.  Chest:     Chest wall: No tenderness.  Abdominal:     General: Abdomen is flat. Bowel sounds are normal. There is no distension.     Palpations: Abdomen is soft. There is no mass.     Tenderness: There is no abdominal tenderness. There is no right CVA tenderness, left CVA tenderness, guarding or rebound.     Hernia: No hernia is present.   Genitourinary:    Rectum: Guaiac result negative.  Musculoskeletal: Normal range of motion.        General: No swelling, tenderness, deformity or signs of injury.     Right lower leg: No edema.     Left lower leg: No edema.  Skin:    General: Skin is warm and dry.     Coloration: Skin is not jaundiced or pale.     Findings: No bruising, erythema, lesion or rash.  Neurological:     General: No focal deficit present.     Mental Status: He is alert and oriented to person, place, and time.     Cranial Nerves: No cranial nerve deficit.     Sensory: No sensory deficit.     Motor: No weakness.     Coordination: Coordination normal.     Gait: Gait normal.     Deep Tendon Reflexes: Reflexes normal.  Psychiatric:        Mood and Affect: Mood normal.        Behavior: Behavior normal.        Thought Content: Thought content normal.        Judgment: Judgment normal.       Assessment & Plan:  1. Essential hypertension - No change in medications  - CBC with Differential/Platelet - Comprehensive metabolic panel - Hemoglobin A1c - Lipid panel - TSH  2. Diabetes mellitus without complication ([facility]) - Continue with metformin  - CBC with Differential/Platelet - Comprehensive metabolic panel - Hemoglobin A1c - Lipid panel - TSH - metFORMIN (GLUCOPHAGE) 500 MG tablet; Take 2 tablets with AM meal and 1 tablet with PM meal  Dispense: 270 tablet; Refill: 1  3. Atherosclerosis of native coronary artery of native heart without angina pectoris  - atorvastatin (LIPITOR) 40 MG tablet; Take 1 tablet (40 mg total) by mouth daily.  Dispense: 90 tablet; Refill: 3  4. BPH with urinary obstruction  - doxazosin (CARDURA) 2 MG tablet; Take 1 tablet (2 mg total) by mouth at bedtime.  Dispense: 90 tablet; Refill: 3  5. Chronic insomnia - Continue with Ambien 5 mg   6. Diabetic polyneuropathy associated with type 2 diabetes mellitus (HCC) - Take gabapentin PRN   7. Mixed hyperlipidemia - Continue  with Atorvastatin  - CBC with Differential/Platelet - Comprehensive metabolic panel - Hemoglobin A1c - Lipid panel - TSH  8. SOB (shortness of breath) - Possible seasonal allergies  - DG Chest 2 View; Future - DG Chest 2 View  9. Lightheadedness - Not cardiac related. Possible inner ear disorder - Ambulatory referral to ENT  10. Medication refill  - metFORMIN (GLUCOPHAGE) 500 MG tablet; Take 2 tablets with AM meal and 1 tablet with PM meal  Dispense: 270 tablet; Refill: 1 - gabapentin (NEURONTIN) 300 MG capsule; Take 1-2 capsules in the evening  Dispense: 180 capsule; Refill: 3 - doxazosin (CARDURA) 2 MG tablet; Take 1 tablet (2 mg total) by mouth at bedtime.  Dispense: 90 tablet; Refill: 3   Dorothyann Peng, NP

## 2019-09-12 ENCOUNTER — Telehealth: Payer: Self-pay | Admitting: Cardiology

## 2019-09-12 NOTE — Telephone Encounter (Signed)
The patient has about 40 pages of records (8 or 9 year history) from his cardiologist in [place]. He wants to know if it would be OK for him to bring the papers to the office to be scanned and put in his chart before his appointment.  The patient can also be contacted via MyChart if that is easier for the office

## 2019-09-12 NOTE — Telephone Encounter (Signed)
mychart message sent

## 2019-09-14 ENCOUNTER — Encounter: Payer: Self-pay | Admitting: Adult Health

## 2019-09-21 DIAGNOSIS — E118 Type 2 diabetes mellitus with unspecified complications: Secondary | ICD-10-CM | POA: Insufficient documentation

## 2019-09-21 NOTE — Progress Notes (Addendum)
Cardiology Office Note   Date:  09/22/2019   ID:  Fernando, Poole 19-Sep-1941, MRN [id number]  PCP:  Dorothyann Peng, NP  Cardiologist:   Minus Breeding, MD  Referring:  Dorothyann Peng, NP  Chief Complaint  Patient presents with  . Coronary Artery Disease     History of Present Illness: Ian [name] is a 78 y.o. male who presents for follow up of CAD.  He had mild branch and non obstructive disease on cath in 2008.  He had a stress echo most recently in 2016.  He was getting most of his care in [place].  Since I last saw him he has done very well.  He brings in extensive medical records from [place] and I will review it here.  He had a heart catheterization in 2018.  He had a well-preserved ejection fraction.  He had a left main 30% stenosis.  He had some mild plaque in his LAD with a 60 to 70% lesion in the first diagonal.  He had a small nondominant right.  There is no mention of any significant circumflex stenosis.  He had a stress perfusion study in 2010.  Demonstrated no significant abnormalities.  Another stress study in 2012 was unremarkable.  Stress echo in 2014 and most recently in 2019 were both unremarkable.  He is sinus bradycardia.  He had right bundle branch block.  Is been followed for very mild carotid plaque.  He returns for follow-up.  He says he has been getting dizzy he is referred to ENT.  He says this is when he turns his head a certain way or walks and looks at things and sometimes when he stands up quickly.  He is not describing any new presyncope or syncope.  He is not been having any new chest pressure, neck or arm discomfort.  He has had no weight gain or edema.  He is walking routinely.  He occasionally sighs.   Past Medical History:  Diagnosis Date  . Allergy   . Apnea, sleep 07/28/07   NPSG [place], AHI 79.9  . Carotid artery stenosis    mild 04/2010, 05/2012  . Coronary heart disease 2008   Cath 2008, 60-70% proximal diagonal 1 stenosis,  RCA 40%  stenosis.  nonischemic Lexiscan 03/2011  . Diabetes mellitus    type II  . GERD (gastroesophageal reflux disease)   . Hyperlipidemia   . Hypertension   . Hypospadias     Past Surgical History:  Procedure Laterality Date  . NASAL SINUS SURGERY    . NISSEN FUNDOPLICATION    . TONSILLECTOMY    . VEIN LIGATION AND STRIPPING       Current Outpatient Medications  Medication Sig Dispense Refill  . aspirin 81 MG tablet Take 1 tablet (81 mg total) by mouth daily. 90 tablet 3  . B Complex Vitamins (VITAMIN B COMPLEX PO) Take by mouth.    . Coenzyme Q10 (CO Q10) 100 MG CAPS Take 1 tablet by mouth daily.    Marland Kitchen doxazosin (CARDURA) 2 MG tablet Take 1 tablet (2 mg total) by mouth at bedtime. 90 tablet 3  . econazole nitrate 1 % cream Apply topically daily. 15 g 2  . fexofenadine (ALLEGRA) 180 MG tablet Take 1 tablet (180 mg total) by mouth daily. 90 tablet 3  . gabapentin (NEURONTIN) 300 MG capsule Take 1-2 capsules in the evening 180 capsule 3  . GLUCOSAMINE-CHONDROITIN PO Take by mouth. 1500 mg glucosamine and 1200 mg of  Chondroitin TWICE DAILY    . hydrocortisone 2.5 % cream Apply topically 2 (two) times daily. 30 g 3  . Lancets (ONETOUCH ULTRASOFT) lancets USE TO TEST BLOOD GLUCOSE TWICE DAILY 200 each 3  . LORazepam (ATIVAN) 0.5 MG tablet Take 1 tablet (0.5 mg total) by mouth once as needed for anxiety. 30 tablet 1  . metFORMIN (GLUCOPHAGE) 500 MG tablet Take 2 tablets with AM meal and 1 tablet with PM meal 270 tablet 1  . olmesartan (BENICAR) 20 MG tablet Take 1 tablet (20 mg total) by mouth daily. 90 tablet 3  . Omega-3 Fatty Acids (FISH OIL ULTRA) 1400 MG CAPS Take by mouth.    . traMADol (ULTRAM) 50 MG tablet Take 1 tablet (50 mg total) by mouth every 8 (eight) hours as needed. 90 tablet 0  . zolpidem (AMBIEN) 5 MG tablet Take 1 tablet (5 mg total) by mouth at bedtime as needed for sleep. 90 tablet 0  . rosuvastatin (CRESTOR) 20 MG tablet Take 1 tablet (20 mg total) by mouth daily. 90  tablet 3   No current facility-administered medications for this visit.     Allergies:   Iohexol    ROS:  Please see the history of present illness.   Otherwise, review of systems are positive for none.   All other systems are reviewed and negative.    PHYSICAL EXAM: VS:  BP (!) 111/56   Pulse 69   Temp (!) 97 F (36.1 C)   Ht 5\' 6"  (1.676 m)   Wt 187 lb (84.8 kg)   SpO2 97%   BMI 30.18 kg/m  , BMI Body mass index is 30.18 kg/m.  GENERAL:  Well appearing NECK:  No jugular venous distention, waveform within normal limits, carotid upstroke brisk and symmetric, no bruits, no thyromegaly LUNGS:  Clear to auscultation bilaterally CHEST:  Unremarkable HEART:  PMI not displaced or sustained,S1 and S2 within normal limits, no S3, no S4, no clicks, no rubs,  no murmurs ABD:  Flat, positive bowel sounds normal in frequency in pitch, no bruits, no rebound, no guarding, no midline pulsatile mass, no hepatomegaly, no splenomegaly EXT:  2 plus pulses throughout, no edema, no cyanosis no clubbing   EKG:  EKG is  ordered today. Sinus rhythm, right bundle branch block, rate 71, intervals within normal limits, no acute ST-T wave changes.  Recent Labs: 09/08/2019: ALT 14; BUN 24; Creatinine, Ser 1.18; Hemoglobin 14.3; Platelets 210.0; Potassium 4.3; Sodium 140; TSH 0.67    Lipid Panel    Component Value Date/Time   CHOL 144 09/08/2019 1041   TRIG 94.0 09/08/2019 1041   HDL 43.60 09/08/2019 1041   CHOLHDL 3 09/08/2019 1041   VLDL 18.8 09/08/2019 1041   LDLCALC 82 09/08/2019 1041     Lab Results  Component Value Date   CREATININE 1.18 09/08/2019    Wt Readings from Last 3 Encounters:  09/22/19 187 lb (84.8 kg)  09/08/19 185 lb (83.9 kg)  11/17/18 197 lb (89.4 kg)      Other studies Reviewed: Additional studies/ records that were reviewed today include: Extensive review of [place] cardiology records as above. Review of the above records demonstrates:   See above.    ASSESSMENT AND PLAN:  CAD:     The patient has no new sypmtoms.  No further cardiovascular testing is indicated.  We will continue with aggressive risk reduction and meds as listed.  He can continue with aggressive risk reduction.  HTN:   The blood  pressure is controlled.  We will continue with meds as listed.  At target. No change in medications is indicated. We will continue with therapeutic lifestyle changes (TLC).  DYSLIPIDEMIA:   LDL was 88 in July.  I am going to change his meds to Crestor 20 mg daily to try to get his goal LDL less than 70.  He is written to get a lipid profile liver enzymes about 10 weeks after that.   DM:  His A1c was 6.4 this summer.  It was up slightly this fall to 6.8.  I will defer to Dorothyann Peng, NP  CKD:   Creatinine was 1.18 most recently.   OBESITY:     He is maintaining 58 lbs lower than his peak.  Continue current therapy.  DIZZINESS:   He did have a very mild drop in his orthostatics blood pressures but this was transient and not at 3 minutes.  It really did not meet any criteria for orthostatic hypotension.  He should proceed with his ENT evaluation.  Current medicines are reviewed at length with the patient today.  The patient does not have concerns regarding medicines.  The following changes have been made:  None  Labs/ tests ordered today include: None  Orders Placed This Encounter  Procedures  . Lipid panel  . EKG 12-Lead     Disposition:   FU with me in 12  months.     Signed, Minus Breeding, MD  09/22/2019 12:52 PM    [facility]

## 2019-09-22 ENCOUNTER — Ambulatory Visit (INDEPENDENT_AMBULATORY_CARE_PROVIDER_SITE_OTHER): Payer: Medicare Other | Admitting: Cardiology

## 2019-09-22 ENCOUNTER — Other Ambulatory Visit: Payer: Self-pay

## 2019-09-22 ENCOUNTER — Encounter: Payer: Self-pay | Admitting: Cardiology

## 2019-09-22 VITALS — BP 111/56 | HR 69 | Temp 97.0°F | Ht 66.0 in | Wt 187.0 lb

## 2019-09-22 DIAGNOSIS — I2510 Atherosclerotic heart disease of native coronary artery without angina pectoris: Secondary | ICD-10-CM | POA: Diagnosis not present

## 2019-09-22 DIAGNOSIS — E118 Type 2 diabetes mellitus with unspecified complications: Secondary | ICD-10-CM | POA: Diagnosis not present

## 2019-09-22 DIAGNOSIS — E785 Hyperlipidemia, unspecified: Secondary | ICD-10-CM

## 2019-09-22 DIAGNOSIS — I10 Essential (primary) hypertension: Secondary | ICD-10-CM | POA: Diagnosis not present

## 2019-09-22 DIAGNOSIS — R42 Dizziness and giddiness: Secondary | ICD-10-CM

## 2019-09-22 MED ORDER — ROSUVASTATIN CALCIUM 20 MG PO TABS: 20.0000 mg | ORAL_TABLET | Freq: Every day | ORAL | 3 refills | Status: DC

## 2019-09-22 NOTE — Patient Instructions (Signed)
Medication Instructions:  STOP ATORVASTATIN WHEN CURRENT SUPPLY COMPLETE THEN START ROSUVASTATIN 20 MG ONCE DAILY  *If you need a refill on your cardiac medications before your next appointment, please call your pharmacy*  Lab Work: Your physician recommends that you return for lab work IN MARCH 2021.  If you have labs (blood work) drawn today and your tests are completely normal, you will receive your results only by: Marland Kitchen MyChart Message (if you have MyChart) OR . A paper copy in the mail If you have any lab test that is abnormal or we need to change your treatment, we will call you to review the results.  Follow-Up: At [facility], you and your health needs are our priority.  As part of our continuing mission to provide you with exceptional heart care, we have created designated Provider Care Teams.  These Care Teams include your primary Cardiologist (physician) and Advanced Practice Providers (APPs -  Physician Assistants and Nurse Practitioners) who all work together to provide you with the care you need, when you need it.  Your next appointment:   12 months  The format for your next appointment:   In Person  Provider:   Minus Breeding, MD

## 2019-09-29 ENCOUNTER — Ambulatory Visit: Payer: Self-pay

## 2019-09-29 ENCOUNTER — Other Ambulatory Visit: Payer: Self-pay

## 2019-09-29 ENCOUNTER — Telehealth (INDEPENDENT_AMBULATORY_CARE_PROVIDER_SITE_OTHER): Payer: Medicare Other | Admitting: Adult Health

## 2019-09-29 ENCOUNTER — Encounter: Payer: Self-pay | Admitting: Adult Health

## 2019-09-29 DIAGNOSIS — I2510 Atherosclerotic heart disease of native coronary artery without angina pectoris: Secondary | ICD-10-CM | POA: Diagnosis not present

## 2019-09-29 DIAGNOSIS — R159 Full incontinence of feces: Secondary | ICD-10-CM | POA: Diagnosis not present

## 2019-09-29 NOTE — Telephone Encounter (Signed)
Patient has an appointment with Tommi Rumps at 2:30 PM

## 2019-09-29 NOTE — Telephone Encounter (Signed)
Patient called stating that the beginning of the month he has about 3 days of diarrhea. He states he feels it came from something he ate that was bad.  He is calling today because he now has leakage of stool through out the day. He states that his BM's are loose but not diarrhea. He has maybe 2 per day but all day he is leaking small amount of stool. Not enough to soil his pants but it is bothersome to him. He denies pain but states there is a heaviness that he feels his abdomin "like I need to go all the time". He has no fever.  Care advice read to patient. He verbalized understanding. Call transferred to office for scheduling.  Reason for Disposition . [1] MILD diarrhea (e.g., 1-3 or more stools than normal in past 24 hours) without known cause AND [2] present >  7 days  Answer Assessment - Initial Assessment Questions 1. DIARRHEA SEVERITY: "How bad is the diarrhea?" "How many extra stools have you had in the past 24 hours than normal?"    - NO DIARRHEA (SCALE 0)   - MILD (SCALE 1-3): Few loose or mushy BMs; increase of 1-3 stools over normal daily number of stools; mild increase in ostomy output.   -  MODERATE (SCALE 4-7): Increase of 4-6 stools daily over normal; moderate increase in ostomy output. * SEVERE (SCALE 8-10; OR 'WORST POSSIBLE'): Increase of 7 or more stools daily over normal; moderate increase in ostomy output; incontinence.    Constant sensation 2-3 times per day 2. ONSET: "When did the diarrhea begin?"      10/30 3. BM CONSISTENCY: "How loose or watery is the diarrhea?"     loose 4. VOMITING: "Are you also vomiting?" If so, ask: "How many times in the past 24 hours?"      no 5. ABDOMINAL PAIN: "Are you having any abdominal pain?" If yes: "What does it feel like?" (e.g., crampy, dull, intermittent, constant)      Feels heavy 6. ABDOMINAL PAIN SEVERITY: If present, ask: "How bad is the pain?"  (e.g., Scale 1-10; mild, moderate, or severe)   - MILD (1-3): doesn't interfere with  normal activities, abdomen soft and not tender to touch    - MODERATE (4-7): interferes with normal activities or awakens from sleep, tender to touch    - SEVERE (8-10): excruciating pain, doubled over, unable to do any normal activities      none 7. ORAL INTAKE: If vomiting, "Have you been able to drink liquids?" "How much fluids have you had in the past 24 hours?"     No problem 8. HYDRATION: "Any signs of dehydration?" (e.g., dry mouth [not just dry lips], too weak to stand, dizziness, new weight loss) "When did you last urinate?"     No nothing new has lightheadedness for year 9. EXPOSURE: "Have you traveled to a foreign country recently?" "Have you been exposed to anyone with diarrhea?" "Could you have eaten any food that was spoiled?"     Unsure 10. ANTIBIOTIC USE: "Are you taking antibiotics now or have you taken antibiotics in the past 2 months?"      no 11. OTHER SYMPTOMS: "Do you have any other symptoms?" (e.g., fever, blood in stool)       no 12. PREGNANCY: "Is there any chance you are pregnant?" "When was your last menstrual period?"       N/A  Protocols used: DIARRHEA-A-AH

## 2019-09-29 NOTE — Progress Notes (Signed)
Virtual Visit via Video Note  I connected with Ian [name] year old male  on 09/29/19 at  2:30 PM EST by a video enabled telemedicine application and verified that I am speaking with the correct person using two identifiers.  Location patient: home Location provider:work or home office Persons participating in the virtual visit: patient, provider  I discussed the limitations of evaluation and management by telemedicine and the availability of in person appointments. The patient expressed understanding and agreed to proceed.   HPI: At the beginning of November after having a [employer] coffee he developed 3 days of constant diarrhea.  That resolved but since he has had soft nonformed bowel movements with fecal incontinence.  Per patient report he will clean himself very well in the evening and when he wakes up he has soiled his boxers.  This also happens throughout the day. He also endorsed a pretty constant urge to defecate.  Is tried changing his diet to having less fiber and foods that may aggravate stomach but has not had any improvement.  Not tried any over-the-counter medications.  He denies nausea, vomiting, abdominal pain, fevers, or chills.  His appetite is still good   ROS: See pertinent positives and negatives per HPI.  Past Medical History:  Diagnosis Date  . Allergy   . Apnea, sleep 07/28/07   NPSG [place], AHI 79.9  . Carotid artery stenosis    mild 04/2010, 05/2012  . Coronary heart disease 2008   Cath 2008, 60-70% proximal diagonal 1 stenosis,  RCA 40% stenosis.  nonischemic Lexiscan 03/2011  . Diabetes mellitus    type II  . GERD (gastroesophageal reflux disease)   . Hyperlipidemia   . Hypertension   . Hypospadias     Past Surgical History:  Procedure Laterality Date  . NASAL SINUS SURGERY    . NISSEN FUNDOPLICATION    . TONSILLECTOMY    . VEIN LIGATION AND STRIPPING      Family History  Problem Relation Age of Onset  . Coronary artery disease Father 75   CABG  . Dementia Mother      Current Outpatient Medications:  .  aspirin 81 MG tablet, Take 1 tablet (81 mg total) by mouth daily., Disp: 90 tablet, Rfl: 3 .  B Complex Vitamins (VITAMIN B COMPLEX PO), Take by mouth., Disp: , Rfl:  .  Coenzyme Q10 (CO Q10) 100 MG CAPS, Take 1 tablet by mouth daily., Disp: , Rfl:  .  doxazosin (CARDURA) 2 MG tablet, Take 1 tablet (2 mg total) by mouth at bedtime., Disp: 90 tablet, Rfl: 3 .  econazole nitrate 1 % cream, Apply topically daily., Disp: 15 g, Rfl: 2 .  fexofenadine (ALLEGRA) 180 MG tablet, Take 1 tablet (180 mg total) by mouth daily., Disp: 90 tablet, Rfl: 3 .  gabapentin (NEURONTIN) 300 MG capsule, Take 1-2 capsules in the evening, Disp: 180 capsule, Rfl: 3 .  GLUCOSAMINE-CHONDROITIN PO, Take by mouth. 1500 mg glucosamine and 1200 mg of Chondroitin TWICE DAILY, Disp: , Rfl:  .  hydrocortisone 2.5 % cream, Apply topically 2 (two) times daily., Disp: 30 g, Rfl: 3 .  Lancets (ONETOUCH ULTRASOFT) lancets, USE TO TEST BLOOD GLUCOSE TWICE DAILY, Disp: 200 each, Rfl: 3 .  LORazepam (ATIVAN) 0.5 MG tablet, Take 1 tablet (0.5 mg total) by mouth once as needed for anxiety., Disp: 30 tablet, Rfl: 1 .  metFORMIN (GLUCOPHAGE) 500 MG tablet, Take 2 tablets with AM meal and 1 tablet with PM meal, Disp: 270 tablet,  Rfl: 1 .  olmesartan (BENICAR) 20 MG tablet, Take 1 tablet (20 mg total) by mouth daily., Disp: 90 tablet, Rfl: 3 .  Omega-3 Fatty Acids (FISH OIL ULTRA) 1400 MG CAPS, Take by mouth., Disp: , Rfl:  .  rosuvastatin (CRESTOR) 20 MG tablet, Take 1 tablet (20 mg total) by mouth daily., Disp: 90 tablet, Rfl: 3 .  traMADol (ULTRAM) 50 MG tablet, Take 1 tablet (50 mg total) by mouth every 8 (eight) hours as needed., Disp: 90 tablet, Rfl: 0 .  zolpidem (AMBIEN) 5 MG tablet, Take 1 tablet (5 mg total) by mouth at bedtime as needed for sleep., Disp: 90 tablet, Rfl: 0  EXAM:  VITALS per patient if applicable:  GENERAL: alert, oriented, appears well and in no  acute distress  HEENT: atraumatic, conjunttiva clear, no obvious abnormalities on inspection of external nose and ears  NECK: normal movements of the head and neck  LUNGS: on inspection no signs of respiratory distress, breathing rate appears normal, no obvious gross SOB, gasping or wheezing  CV: no obvious cyanosis  MS: moves all visible extremities without noticeable abnormality  PSYCH/NEURO: pleasant and cooperative, no obvious depression or anxiety, speech and thought processing grossly intact  ASSESSMENT AND PLAN:  Discussed the following assessment and plan:  1. Incontinence of feces, unspecified fecal incontinence type -Since he is not having fevers, chills, or abdominal pain.  We will hold off on antibiotics at this time.  I will have him use Activa and pepto bismol over the weekend.  If no improvement over the next few days we will do a stool culture    I discussed the assessment and treatment plan with the patient. The patient was provided an opportunity to ask questions and all were answered. The patient agreed with the plan and demonstrated an understanding of the instructions.   The patient was advised to call back or seek an in-person evaluation if the symptoms worsen or if the condition fails to improve as anticipated.   Dorothyann Peng, NP

## 2019-10-03 ENCOUNTER — Ambulatory Visit (INDEPENDENT_AMBULATORY_CARE_PROVIDER_SITE_OTHER): Payer: Medicare Other | Admitting: Otolaryngology

## 2019-10-03 ENCOUNTER — Encounter (INDEPENDENT_AMBULATORY_CARE_PROVIDER_SITE_OTHER): Payer: Self-pay | Admitting: Otolaryngology

## 2019-10-03 ENCOUNTER — Other Ambulatory Visit: Payer: Self-pay

## 2019-10-03 VITALS — Temp 96.3°F

## 2019-10-03 DIAGNOSIS — I2510 Atherosclerotic heart disease of native coronary artery without angina pectoris: Secondary | ICD-10-CM

## 2019-10-03 DIAGNOSIS — R42 Dizziness and giddiness: Secondary | ICD-10-CM

## 2019-10-03 NOTE — Progress Notes (Signed)
HPI: Ian [name] is a 78 y.o. male who presents for evaluation of dizziness.  He complains of a sensation of lightheadedness when he first stands up when he moves around.  He does not really describe vertigo or spinning sensation or sensation of movement.  He feels like he hears well.  He has been having this sensation for over a year.  It has not really gotten a lot worse.  He denies any headaches. He has seen both his PCP as well as his cardiologist as he thought it was initially related to his blood pressure but with change of medications have not changed the symptoms. He does walk about a mile a day  Past Medical History:  Diagnosis Date  . Allergy   . Apnea, sleep 07/28/07   NPSG [place], AHI 79.9  . Carotid artery stenosis    mild 04/2010, 05/2012  . Coronary heart disease 2008   Cath 2008, 60-70% proximal diagonal 1 stenosis,  RCA 40% stenosis.  nonischemic Lexiscan 03/2011  . Diabetes mellitus    type II  . GERD (gastroesophageal reflux disease)   . Hyperlipidemia   . Hypertension   . Hypospadias    Past Surgical History:  Procedure Laterality Date  . NASAL SINUS SURGERY    . NISSEN FUNDOPLICATION    . TONSILLECTOMY    . VEIN LIGATION AND STRIPPING     Social History   Socioeconomic History  . Marital status: Married    Spouse name: Not on file  . Number of children: 3  . Years of education: Not on file  . Highest education level: Not on file  Occupational History  . Occupation: Retired Games developer: RETIRED  Social Needs  . Financial resource strain: Not on file  . Food insecurity    Worry: Not on file    Inability: Not on file  . Transportation needs    Medical: Not on file    Non-medical: Not on file  Tobacco Use  . Smoking status: Never Smoker  . Smokeless tobacco: Never Used  Substance and Sexual Activity  . Alcohol use: No    Alcohol/week: 0.0 standard drinks  . Drug use: No  . Sexual activity: Not on file  Lifestyle  . Physical  activity    Days per week: Not on file    Minutes per session: Not on file  . Stress: Not on file  Relationships  . Social Herbalist on phone: Not on file    Gets together: Not on file    Attends religious service: Not on file    Active member of club or organization: Not on file    Attends meetings of clubs or organizations: Not on file    Relationship status: Not on file  Other Topics Concern  . Not on file  Social History Narrative   Married.  Three children from first marriage.  First wife died of ovarian cancer.     Retired, did work for [employer] for 44 years.    Family History  Problem Relation Age of Onset  . Coronary artery disease Father 49       CABG  . Dementia Mother    Allergies  Allergen Reactions  . Iohexol      Desc: pt. states severe reaction, dr. told patient never to have the dye again    Prior to Admission medications   Medication Sig Start Date End Date Taking? Authorizing Provider  aspirin  81 MG tablet Take 1 tablet (81 mg total) by mouth daily. 09/26/12   Shawna Orleans, Doe-Hyun R, DO  B Complex Vitamins (VITAMIN B COMPLEX PO) Take by mouth.    [provider]  Coenzyme Q10 (CO Q10) 100 MG CAPS Take 1 tablet by mouth daily.    [provider]  doxazosin (CARDURA) 2 MG tablet Take 1 tablet (2 mg total) by mouth at bedtime. 09/08/19   Nafziger, Tommi Rumps, NP  econazole nitrate 1 % cream Apply topically daily. 09/09/18   Nafziger, Tommi Rumps, NP  fexofenadine (ALLEGRA) 180 MG tablet Take 1 tablet (180 mg total) by mouth daily. 01/28/18   Nafziger, Tommi Rumps, NP  gabapentin (NEURONTIN) 300 MG capsule Take 1-2 capsules in the evening 09/08/19   Nafziger, Tommi Rumps, NP  GLUCOSAMINE-CHONDROITIN PO Take by mouth. 1500 mg glucosamine and 1200 mg of Chondroitin TWICE DAILY    [provider]  hydrocortisone 2.5 % cream Apply topically 2 (two) times daily. 04/12/18   Nafziger, Tommi Rumps, NP  Lancets [facility] ULTRASOFT) lancets USE TO TEST BLOOD GLUCOSE TWICE  DAILY 03/21/19   Nafziger, Tommi Rumps, NP  LORazepam (ATIVAN) 0.5 MG tablet Take 1 tablet (0.5 mg total) by mouth once as needed for anxiety. 09/08/19   Nafziger, Tommi Rumps, NP  metFORMIN (GLUCOPHAGE) 500 MG tablet Take 2 tablets with AM meal and 1 tablet with PM meal 09/08/19   Nafziger, Tommi Rumps, NP  olmesartan (BENICAR) 20 MG tablet Take 1 tablet (20 mg total) by mouth daily. 09/08/19   Nafziger, Tommi Rumps, NP  Omega-3 Fatty Acids (FISH OIL ULTRA) 1400 MG CAPS Take by mouth.    [provider]  rosuvastatin (CRESTOR) 20 MG tablet Take 1 tablet (20 mg total) by mouth daily. 09/22/19 12/21/19  Minus Breeding, MD  traMADol (ULTRAM) 50 MG tablet Take 1 tablet (50 mg total) by mouth every 8 (eight) hours as needed. 09/08/19   Nafziger, Tommi Rumps, NP  zolpidem (AMBIEN) 5 MG tablet Take 1 tablet (5 mg total) by mouth at bedtime as needed for sleep. 09/08/19   Nafziger, Tommi Rumps, NP     Positive ROS: Otherwise negative  All other systems have been reviewed and were otherwise negative with the exception of those mentioned in the HPI and as above.  Physical Exam: Constitutional: Alert, well-appearing, no acute distress Ears: External ears without lesions or tenderness. Ear canals are clear bilaterally with intact, clear TMs.  On tuning fork testing he had good hearing in both ears.  Dix-Hallpike testing was negative for any evidence of positional vertigo.  Patient describes some sensation of lightheadedness here in the office but he has no evidence of nystagmus.   Nasal: External nose without lesions. Septum relatively midline. Clear nasal passages.  Moderate rhinitis.  Patient has had previous surgery over 20 years ago. Oral: Lips and gums without lesions. Tongue and palate mucosa without lesions. Posterior oropharynx clear. Neck: No palpable adenopathy or masses Respiratory: Breathing comfortably  Skin: No facial/neck lesions or rash noted.  Procedures  Assessment: Dizziness questionable etiology.  This is not  consistent with inner ear or vestibular symptoms.  Plan: Would agree with regular exercise as he is presently doing. If lightheadedness worsens would recommend further evaluation with neurologist. Could consider ENG testing but doubt this is vestibular in origin. He will follow-up as needed  Radene Journey, MD

## 2019-10-04 LAB — HM DIABETES EYE EXAM

## 2019-10-06 ENCOUNTER — Encounter: Payer: Self-pay | Admitting: Adult Health

## 2019-10-10 NOTE — Telephone Encounter (Signed)
ERROR

## 2019-11-08 ENCOUNTER — Encounter: Payer: Self-pay | Admitting: Family Medicine

## 2019-11-21 ENCOUNTER — Encounter: Payer: Self-pay | Admitting: Adult Health

## 2019-12-03 ENCOUNTER — Other Ambulatory Visit: Payer: Self-pay | Admitting: Adult Health

## 2019-12-03 DIAGNOSIS — Z760 Encounter for issue of repeat prescription: Secondary | ICD-10-CM

## 2019-12-07 ENCOUNTER — Encounter: Payer: Self-pay | Admitting: Adult Health

## 2019-12-07 ENCOUNTER — Ambulatory Visit: Payer: Medicare Other

## 2019-12-07 LAB — HM DIABETES EYE EXAM

## 2019-12-13 ENCOUNTER — Ambulatory Visit: Payer: Medicare Other | Admitting: Adult Health

## 2019-12-15 ENCOUNTER — Ambulatory Visit: Payer: Medicare Other | Attending: Internal Medicine

## 2019-12-15 DIAGNOSIS — Z23 Encounter for immunization: Secondary | ICD-10-CM | POA: Insufficient documentation

## 2019-12-15 NOTE — Progress Notes (Signed)
   Covid-19 Vaccination Clinic  Name:  Ian [name]    MRN: [id number] DOB: 1941-07-14  12/15/2019  Mr. Velarde was observed post Covid-19 immunization for 15 minutes without incidence. He was provided with Vaccine Information Sheet and instruction to access the V-Safe system.   Mr. Zachry was instructed to call 911 with any severe reactions post vaccine: Marland Kitchen Difficulty breathing  . Swelling of your face and throat  . A fast heartbeat  . A bad rash all over your body  . Dizziness and weakness    Immunizations Administered    Name Date Dose VIS Date Route   Pfizer COVID-19 Vaccine 12/15/2019  4:37 PM 0.3 mL 10/20/2019 Intramuscular   Manufacturer: [place]   Lot: [id number]   [place]: [id number]

## 2019-12-18 ENCOUNTER — Ambulatory Visit: Payer: Medicare Other

## 2019-12-27 ENCOUNTER — Encounter: Payer: Self-pay | Admitting: Adult Health

## 2019-12-27 ENCOUNTER — Other Ambulatory Visit: Payer: Self-pay

## 2019-12-27 ENCOUNTER — Telehealth (INDEPENDENT_AMBULATORY_CARE_PROVIDER_SITE_OTHER): Payer: Medicare Other | Admitting: Adult Health

## 2019-12-27 DIAGNOSIS — B359 Dermatophytosis, unspecified: Secondary | ICD-10-CM

## 2019-12-27 MED ORDER — FLUCONAZOLE 150 MG PO TABS: ORAL_TABLET | ORAL | 0 refills | Status: DC

## 2019-12-27 NOTE — Progress Notes (Signed)
Virtual Visit via Video Note  I connected with Ian [name] on 12/27/19 at  3:30 PM EST by a video enabled telemedicine application and verified that I am speaking with the correct person using two identifiers.  Location patient: home Location provider:work or home office Persons participating in the virtual visit: patient, provider  I discussed the limitations of evaluation and management by telemedicine and the availability of in person appointments. The patient expressed understanding and agreed to proceed.   HPI: 79 year old male who is being evaluated today for continued rash around his gluteal cleft.  He has been treating this with nystatin cream and reports that overall it is about 85% improved but over the last few months it has not resolved completely.  When he stops using the cream the rash returns with a vengeance".  Rash is itchy and burns as well.   ROS: See pertinent positives and negatives per HPI.  Past Medical History:  Diagnosis Date  . Allergy   . Apnea, sleep 07/28/07   NPSG [place], AHI 79.9  . Carotid artery stenosis    mild 04/2010, 05/2012  . Coronary heart disease 2008   Cath 2008, 60-70% proximal diagonal 1 stenosis,  RCA 40% stenosis.  nonischemic Lexiscan 03/2011  . Diabetes mellitus    type II  . GERD (gastroesophageal reflux disease)   . Hyperlipidemia   . Hypertension   . Hypospadias     Past Surgical History:  Procedure Laterality Date  . NASAL SINUS SURGERY    . NISSEN FUNDOPLICATION    . TONSILLECTOMY    . VEIN LIGATION AND STRIPPING      Family History  Problem Relation Age of Onset  . Coronary artery disease Father 69       CABG  . Dementia Mother        Current Outpatient Medications:  .  aspirin 81 MG tablet, Take 1 tablet (81 mg total) by mouth daily., Disp: 90 tablet, Rfl: 3 .  B Complex Vitamins (VITAMIN B COMPLEX PO), Take by mouth., Disp: , Rfl:  .  Coenzyme Q10 (CO Q10) 100 MG CAPS, Take 1 tablet by mouth daily., Disp: ,  Rfl:  .  doxazosin (CARDURA) 2 MG tablet, Take 1 tablet (2 mg total) by mouth at bedtime., Disp: 90 tablet, Rfl: 3 .  econazole nitrate 1 % cream, Apply topically daily., Disp: 15 g, Rfl: 2 .  fexofenadine (ALLEGRA) 180 MG tablet, Take 1 tablet (180 mg total) by mouth daily., Disp: 90 tablet, Rfl: 3 .  fluticasone (FLONASE) 50 MCG/ACT nasal spray, USE 2 SPRAYS IN EACH NOSTRIL DAILY, Disp: 48 g, Rfl: 3 .  gabapentin (NEURONTIN) 300 MG capsule, Take 1-2 capsules in the evening, Disp: 180 capsule, Rfl: 3 .  GLUCOSAMINE-CHONDROITIN PO, Take by mouth. 1500 mg glucosamine and 1200 mg of Chondroitin TWICE DAILY, Disp: , Rfl:  .  hydrocortisone 2.5 % cream, Apply topically 2 (two) times daily., Disp: 30 g, Rfl: 3 .  Lancets (ONETOUCH ULTRASOFT) lancets, USE TO TEST BLOOD GLUCOSE TWICE DAILY, Disp: 200 each, Rfl: 3 .  LORazepam (ATIVAN) 0.5 MG tablet, Take 1 tablet (0.5 mg total) by mouth once as needed for anxiety., Disp: 30 tablet, Rfl: 1 .  metFORMIN (GLUCOPHAGE) 500 MG tablet, Take 2 tablets with AM meal and 1 tablet with PM meal, Disp: 270 tablet, Rfl: 1 .  olmesartan (BENICAR) 20 MG tablet, Take 1 tablet (20 mg total) by mouth daily., Disp: 90 tablet, Rfl: 3 .  Omega-3 Fatty Acids (  FISH OIL ULTRA) 1400 MG CAPS, Take by mouth., Disp: , Rfl:  .  traMADol (ULTRAM) 50 MG tablet, Take 1 tablet (50 mg total) by mouth every 8 (eight) hours as needed., Disp: 90 tablet, Rfl: 0 .  zolpidem (AMBIEN) 5 MG tablet, Take 1 tablet (5 mg total) by mouth at bedtime as needed for sleep., Disp: 90 tablet, Rfl: 0 .  rosuvastatin (CRESTOR) 20 MG tablet, Take 1 tablet (20 mg total) by mouth daily., Disp: 90 tablet, Rfl: 3  EXAM:  VITALS per patient if applicable:  GENERAL: alert, oriented, appears well and in no acute distress  HEENT: atraumatic, conjunttiva clear, no obvious abnormalities on inspection of external nose and ears  NECK: normal movements of the head and neck  LUNGS: on inspection no signs of  respiratory distress, breathing rate appears normal, no obvious gross SOB, gasping or wheezing  CV: no obvious cyanosis  MS: moves all visible extremities without noticeable abnormality  PSYCH/NEURO: pleasant and cooperative, no obvious depression or anxiety, speech and thought processing grossly intact  ASSESSMENT AND PLAN:  Discussed the following assessment and plan:  1. Tinea - fluconazole (DIFLUCAN) 150 MG tablet; Take one tablet weekly x 4 weeks  Dispense: 4 tablet; Refill: 0     I discussed the assessment and treatment plan with the patient. The patient was provided an opportunity to ask questions and all were answered. The patient agreed with the plan and demonstrated an understanding of the instructions.   The patient was advised to call back or seek an in-person evaluation if the symptoms worsen or if the condition fails to improve as anticipated.   Dorothyann Peng, NP

## 2020-01-09 ENCOUNTER — Ambulatory Visit: Payer: Medicare Other | Attending: Internal Medicine

## 2020-01-09 DIAGNOSIS — Z23 Encounter for immunization: Secondary | ICD-10-CM | POA: Insufficient documentation

## 2020-01-09 NOTE — Progress Notes (Signed)
   Covid-19 Vaccination Clinic  Name:  Ian [name]    MRN: [id number] DOB: Aug 07, 1941  01/09/2020  [name] was observed post Covid-19 immunization for 15 minutes without incident. He was provided with Vaccine Information Sheet and instruction to access the V-Safe system.   [name] was instructed to call 911 with any severe reactions post vaccine: Marland Kitchen Difficulty breathing  . Swelling of face and throat  . A fast heartbeat  . A bad rash all over body  . Dizziness and weakness   Immunizations Administered    Name Date Dose VIS Date Route   Pfizer COVID-19 Vaccine 01/09/2020  3:39 PM 0.3 mL 10/20/2019 Intramuscular   Manufacturer: [place]   Lot: [id number]   [place]: [id number]

## 2020-01-23 ENCOUNTER — Encounter: Payer: Self-pay | Admitting: Adult Health

## 2020-01-23 ENCOUNTER — Other Ambulatory Visit: Payer: Self-pay | Admitting: Adult Health

## 2020-01-24 ENCOUNTER — Other Ambulatory Visit: Payer: Self-pay | Admitting: Adult Health

## 2020-01-24 MED ORDER — FLUCONAZOLE 150 MG PO TABS: ORAL_TABLET | ORAL | 0 refills | Status: DC

## 2020-02-09 ENCOUNTER — Encounter: Payer: Self-pay | Admitting: Adult Health

## 2020-02-13 ENCOUNTER — Other Ambulatory Visit: Payer: Self-pay | Admitting: Adult Health

## 2020-02-13 DIAGNOSIS — B359 Dermatophytosis, unspecified: Secondary | ICD-10-CM

## 2020-02-17 ENCOUNTER — Other Ambulatory Visit: Payer: Self-pay | Admitting: Adult Health

## 2020-02-20 ENCOUNTER — Ambulatory Visit (INDEPENDENT_AMBULATORY_CARE_PROVIDER_SITE_OTHER): Payer: Medicare Other | Admitting: Adult Health

## 2020-02-20 ENCOUNTER — Encounter: Payer: Self-pay | Admitting: Adult Health

## 2020-02-20 ENCOUNTER — Other Ambulatory Visit: Payer: Self-pay

## 2020-02-20 VITALS — BP 168/74 | Temp 97.8°F | Wt 184.0 lb

## 2020-02-20 DIAGNOSIS — E782 Mixed hyperlipidemia: Secondary | ICD-10-CM

## 2020-02-20 DIAGNOSIS — N401 Enlarged prostate with lower urinary tract symptoms: Secondary | ICD-10-CM | POA: Diagnosis not present

## 2020-02-20 DIAGNOSIS — E119 Type 2 diabetes mellitus without complications: Secondary | ICD-10-CM

## 2020-02-20 DIAGNOSIS — N138 Other obstructive and reflux uropathy: Secondary | ICD-10-CM

## 2020-02-20 DIAGNOSIS — I10 Essential (primary) hypertension: Secondary | ICD-10-CM

## 2020-02-20 DIAGNOSIS — B359 Dermatophytosis, unspecified: Secondary | ICD-10-CM

## 2020-02-20 LAB — BASIC METABOLIC PANEL
BUN: 19 mg/dL (ref 6–23)
CO2: 27 mEq/L (ref 19–32)
Calcium: 9.7 mg/dL (ref 8.4–10.5)
Chloride: 104 mEq/L (ref 96–112)
Creatinine, Ser: 1.04 mg/dL (ref 0.40–1.50)
GFR: 68.59 mL/min (ref >60.00)
Glucose, Bld: 98 mg/dL (ref 70–99)
Potassium: 4.4 mEq/L (ref 3.5–5.1)
Sodium: 138 mEq/L (ref 135–145)

## 2020-02-20 LAB — LIPID PANEL
Cholesterol: 149 mg/dL (ref 0–200)
HDL: 42.4 mg/dL (ref >39.00)
LDL Cholesterol: 80 mg/dL (ref 0–99)
NonHDL: 107.03
Total CHOL/HDL Ratio: 4
Triglycerides: 137 mg/dL (ref 0.0–149.0)
VLDL: 27.4 mg/dL (ref 0.0–40.0)

## 2020-02-20 LAB — PSA: PSA: 0.8 ng/mL (ref 0.10–4.00)

## 2020-02-20 LAB — HEMOGLOBIN A1C: Hgb A1c MFr Bld: 6.5 % (ref 4.6–6.5)

## 2020-02-20 MED ORDER — TERBINAFINE HCL 250 MG PO TABS: 250.0000 mg | ORAL_TABLET | Freq: Every day | ORAL | 0 refills | Status: DC

## 2020-02-20 MED ORDER — HYDROCORTISONE 2.5 % EX CREA: TOPICAL_CREAM | Freq: Two times a day (BID) | CUTANEOUS | 3 refills | Status: DC

## 2020-02-20 MED ORDER — DOXAZOSIN MESYLATE 2 MG PO TABS: 2.0000 mg | ORAL_TABLET | Freq: Every day | ORAL | 3 refills | Status: DC

## 2020-02-20 NOTE — Progress Notes (Signed)
Subjective:    Patient ID: Ian [name], male    DOB: 1941/08/08, 79 y.o.   MRN: [id number]  HPI 78 year old male who  has a past medical history of Allergy, Apnea, sleep (07/28/07), Carotid artery stenosis, Coronary heart disease (2008), Diabetes mellitus, GERD (gastroesophageal reflux disease), Hyperlipidemia, Hypertension, and Hypospadias.   He presents to the office today for follow-up regarding multiple medical issues.   Diabetes is currently controlled with Metformin 1000 mg in the morning and 500 mg in the evening.  He denies symptoms of hypoglycemia.  He does have mild diabetic neuropathy and takes gabapentin to help alleviate his discomfort. He does not monitor his blood sugars on a regular basis.  Lab Results  Component Value Date   HGBA1C 6.5 02/20/2020    Hypertension -he is currently prescribed Benicar 20 mg daily.  He recently came off Cardura 2 mg in the hopes that this would help with his transient dizziness.  Unfortunately his dizziness continues.  He has not been checking his blood pressures at home on a routine basis.  In the office today his blood pressure is elevated on multiple tries.  He denies headaches, blurred vision, or lightheadedness. BP Readings from Last 3 Encounters:  02/20/20 (!) 168/74  09/22/19 (!) 111/56  09/08/19 134/60   Fungal Infection -continues to have a rash around his gluteal cleft.  In the past we have treated with nystatin cream as well as Diflucan and the rash improves dramatically but does not completely resolve and then once he stops the medication the rash comes back.  Does report a burning discomfort from the area of the rash.  Has not seen any drainage or discharge.  Rash is worse when he sits for extended periods of time  Hyperlipidemia -currently prescribed Crestor 20 mg daily.  Was recently changed from atorvastatin by cardiology due to having evaded LDL levels that were done by his physician in [place].  He would like to recheck his  cholesterol panel today   Review of Systems See HPI   Past Medical History:  Diagnosis Date  . Allergy   . Apnea, sleep 07/28/07   NPSG [place], AHI 79.9  . Carotid artery stenosis    mild 04/2010, 05/2012  . Coronary heart disease 2008   Cath 2008, 60-70% proximal diagonal 1 stenosis,  RCA 40% stenosis.  nonischemic Lexiscan 03/2011  . Diabetes mellitus    type II  . GERD (gastroesophageal reflux disease)   . Hyperlipidemia   . Hypertension   . Hypospadias     Social History   Socioeconomic History  . Marital status: Married    Spouse name: Not on file  . Number of children: 3  . Years of education: Not on file  . Highest education level: Not on file  Occupational History  . Occupation: Retired Games developer: RETIRED  Tobacco Use  . Smoking status: Never Smoker  . Smokeless tobacco: Never Used  Substance and Sexual Activity  . Alcohol use: No    Alcohol/week: 0.0 standard drinks  . Drug use: No  . Sexual activity: Not on file  Other Topics Concern  . Not on file  Social History Narrative   Married.  Three children from first marriage.  First wife died of ovarian cancer.     Retired, did work for [employer] for 44 years.    Social Determinants of Health   Financial Resource Strain:   . Difficulty of Paying Living Expenses:  Food Insecurity:   . Worried About Charity fundraiser in the Last Year:   . Arboriculturist in the Last Year:   Transportation Needs:   . Film/video editor (Medical):   Marland Kitchen Lack of Transportation (Non-Medical):   Physical Activity:   . Days of Exercise per Week:   . Minutes of Exercise per Session:   Stress:   . Feeling of Stress :   Social Connections:   . Frequency of Communication with Friends and Family:   . Frequency of Social Gatherings with Friends and Family:   . Attends Religious Services:   . Active Member of Clubs or Organizations:   . Attends Archivist Meetings:   Marland Kitchen Marital Status:     Intimate Partner Violence:   . Fear of Current or Ex-Partner:   . Emotionally Abused:   Marland Kitchen Physically Abused:   . Sexually Abused:     Past Surgical History:  Procedure Laterality Date  . NASAL SINUS SURGERY    . NISSEN FUNDOPLICATION    . TONSILLECTOMY    . VEIN LIGATION AND STRIPPING      Family History  Problem Relation Age of Onset  . Coronary artery disease Father 26       CABG  . Dementia Mother     Allergies  Allergen Reactions  . Iohexol      Desc: pt. states severe reaction, dr. told patient never to have the dye again     Current Outpatient Medications on File Prior to Visit  Medication Sig Dispense Refill  . aspirin 81 MG tablet Take 1 tablet (81 mg total) by mouth daily. 90 tablet 3  . B Complex Vitamins (VITAMIN B COMPLEX PO) Take by mouth.    . Coenzyme Q10 (CO Q10) 100 MG CAPS Take 1 tablet by mouth daily.    Marland Kitchen econazole nitrate 1 % cream Apply topically daily. 15 g 2  . fexofenadine (ALLEGRA) 180 MG tablet Take 1 tablet (180 mg total) by mouth daily. 90 tablet 3  . fluticasone (FLONASE) 50 MCG/ACT nasal spray USE 2 SPRAYS IN EACH NOSTRIL DAILY 48 g 3  . gabapentin (NEURONTIN) 300 MG capsule Take 1-2 capsules in the evening 180 capsule 3  . GLUCOSAMINE-CHONDROITIN PO Take by mouth. 1500 mg glucosamine and 1200 mg of Chondroitin TWICE DAILY    . Lancets (ONETOUCH ULTRASOFT) lancets USE TO TEST BLOOD GLUCOSE TWICE DAILY 200 each 3  . LORazepam (ATIVAN) 0.5 MG tablet Take 1 tablet (0.5 mg total) by mouth once as needed for anxiety. 30 tablet 1  . metFORMIN (GLUCOPHAGE) 500 MG tablet Take 2 tablets with AM meal and 1 tablet with PM meal 270 tablet 1  . olmesartan (BENICAR) 20 MG tablet Take 1 tablet (20 mg total) by mouth daily. 90 tablet 3  . Omega-3 Fatty Acids (FISH OIL ULTRA) 1400 MG CAPS Take by mouth.    . traMADol (ULTRAM) 50 MG tablet TAKE 1 TABLET EVERY 8 HOURS AS NEEDED 90 tablet 0  . zolpidem (AMBIEN) 5 MG tablet TAKE 1 TABLET AT BEDTIME AS NEEDED  FOR SLEEP 90 tablet 0  . rosuvastatin (CRESTOR) 20 MG tablet Take 1 tablet (20 mg total) by mouth daily. 90 tablet 3   No current facility-administered medications on file prior to visit.    BP (!) 168/74   Temp 97.8 F (36.6 C)   Wt 184 lb (83.5 kg)   BMI 29.70 kg/m       Objective:  Physical Exam Vitals and nursing note reviewed.  Constitutional:      Appearance: Normal appearance.  Cardiovascular:     Rate and Rhythm: Normal rate and regular rhythm.     Pulses: Normal pulses.     Heart sounds: Normal heart sounds.  Pulmonary:     Effort: Pulmonary effort is normal.     Breath sounds: Normal breath sounds.  Musculoskeletal:        General: Normal range of motion.  Skin:    General: Skin is warm and dry.     Findings: Erythema present.     Comments: Red, flat rash noted around gluteal cleft  Neurological:     General: No focal deficit present.     Mental Status: He is alert and oriented to person, place, and time.  Psychiatric:        Mood and Affect: Mood normal.        Behavior: Behavior normal.        Thought Content: Thought content normal.        Judgment: Judgment normal.        Assessment & Plan:  1. Diabetes mellitus without complication ([place]) - Consider increase in Metformin  - Hemoglobin 123456 - Basic Metabolic Panel  2. Mixed hyperlipidemia  - Lipid panel  3. Tinea - Not resolved. Will start on Lamisil and have him apply hydrocortisone cream BID.  - terbinafine (LAMISIL) 250 MG tablet; Take 1 tablet (250 mg total) by mouth daily.  Dispense: 30 tablet; Refill: 0 - hydrocortisone 2.5 % cream; Apply topically 2 (two) times daily.  Dispense: 30 g; Refill: 3  4. BPH with urinary obstruction  - PSA - Basic Metabolic Panel  5. Essential hypertension - Restart Cardura.  Check blood pressure at home twice daily and send me results via MyChart in 2 weeks - doxazosin (CARDURA) 2 MG tablet; Take 1 tablet (2 mg total) by mouth at bedtime.  Dispense: 90  tablet; Refill: 3   Dorothyann Peng, NP

## 2020-02-20 NOTE — Telephone Encounter (Signed)
Pt has an appt today.  

## 2020-02-22 ENCOUNTER — Other Ambulatory Visit: Payer: Self-pay

## 2020-02-22 DIAGNOSIS — E785 Hyperlipidemia, unspecified: Secondary | ICD-10-CM

## 2020-02-22 MED ORDER — ROSUVASTATIN CALCIUM 40 MG PO TABS: 40.0000 mg | ORAL_TABLET | Freq: Every day | ORAL | 3 refills | Status: AC

## 2020-02-22 NOTE — Telephone Encounter (Signed)
Per Dr. Percival Spanish:  LDL (cholesterol) still not at target. Please increase Crestor to 40 mg daily and repeat lipid and liver in 10 weeks.

## 2020-02-23 ENCOUNTER — Encounter: Payer: Self-pay | Admitting: Adult Health

## 2020-02-27 ENCOUNTER — Encounter: Payer: Self-pay | Admitting: Adult Health

## 2020-02-29 ENCOUNTER — Encounter: Payer: Self-pay | Admitting: Adult Health

## 2020-03-01 ENCOUNTER — Other Ambulatory Visit: Payer: Self-pay | Admitting: Adult Health

## 2020-03-01 NOTE — Telephone Encounter (Signed)
PA completed on Cover My Meds.  Determination was that rx is covered.  Will now need to do a coverage exception form.  Right now will only cover #21 for 7 days.

## 2020-03-05 ENCOUNTER — Telehealth: Payer: Self-pay | Admitting: Family Medicine

## 2020-03-05 NOTE — Telephone Encounter (Signed)
Quantity exception

## 2020-03-05 NOTE — Telephone Encounter (Signed)
Spoke to [employer] for PA on tramadol.  I had to submit 2 cases.  One for approval of the use and the other for a quantity exception.  Both approved from March 28th, 2021 until 03/05/2021.  The Case ID for the regular approval is [id number].  The quantity exception Case ID is [id number].  Message sent to the pt by MyChart to inform him of approval.  Nothing further needed.

## 2020-03-18 ENCOUNTER — Encounter: Payer: Self-pay | Admitting: Adult Health

## 2020-03-19 ENCOUNTER — Other Ambulatory Visit: Payer: Self-pay | Admitting: Adult Health

## 2020-03-19 DIAGNOSIS — B359 Dermatophytosis, unspecified: Secondary | ICD-10-CM

## 2020-03-19 MED ORDER — TERBINAFINE HCL 250 MG PO TABS: 250.0000 mg | ORAL_TABLET | Freq: Every day | ORAL | 0 refills | Status: DC

## 2020-04-03 LAB — HM DIABETES EYE EXAM

## 2020-04-09 ENCOUNTER — Encounter: Payer: Self-pay | Admitting: Family Medicine

## 2020-04-13 LAB — LIPID PANEL
Chol/HDL Ratio: 2.9 ratio (ref 0.0–5.0)
Cholesterol, Total: 134 mg/dL (ref 100–199)
HDL: 47 mg/dL (ref >39)
LDL Chol Calc (NIH): 69 mg/dL (ref 0–99)
Triglycerides: 98 mg/dL (ref 0–149)
VLDL Cholesterol Cal: 18 mg/dL (ref 5–40)

## 2020-04-13 LAB — HEPATIC FUNCTION PANEL
ALT: 11 IU/L (ref 0–44)
AST: 18 IU/L (ref 0–40)
Albumin: 4.6 g/dL (ref 3.7–4.7)
Alkaline Phosphatase: 53 IU/L (ref 48–121)
Bilirubin Total: 0.6 mg/dL (ref 0.0–1.2)
Bilirubin, Direct: 0.19 mg/dL (ref 0.00–0.40)
Total Protein: 6.8 g/dL (ref 6.0–8.5)

## 2020-06-25 ENCOUNTER — Other Ambulatory Visit: Payer: Self-pay | Admitting: Adult Health

## 2020-06-25 ENCOUNTER — Encounter: Payer: Self-pay | Admitting: Adult Health

## 2020-06-25 DIAGNOSIS — B359 Dermatophytosis, unspecified: Secondary | ICD-10-CM

## 2020-06-25 MED ORDER — HYDROCORTISONE 2.5 % EX CREA: TOPICAL_CREAM | Freq: Two times a day (BID) | CUTANEOUS | 3 refills | Status: DC

## 2020-06-25 NOTE — Telephone Encounter (Signed)
Unsure if pt should still be using.

## 2020-07-15 ENCOUNTER — Encounter: Payer: Self-pay | Admitting: Adult Health

## 2020-07-16 ENCOUNTER — Other Ambulatory Visit: Payer: Self-pay | Admitting: Adult Health

## 2020-07-16 MED ORDER — OLMESARTAN MEDOXOMIL 20 MG PO TABS: 20.0000 mg | ORAL_TABLET | Freq: Every day | ORAL | 3 refills | Status: DC

## 2020-07-22 ENCOUNTER — Encounter: Payer: Self-pay | Admitting: Adult Health

## 2020-07-25 ENCOUNTER — Encounter: Payer: Self-pay | Admitting: Adult Health

## 2020-08-10 ENCOUNTER — Encounter: Payer: Self-pay | Admitting: Adult Health

## 2020-08-19 ENCOUNTER — Other Ambulatory Visit: Payer: Self-pay | Admitting: Adult Health

## 2020-08-29 ENCOUNTER — Other Ambulatory Visit: Payer: Self-pay

## 2020-08-29 ENCOUNTER — Encounter: Payer: Self-pay | Admitting: Adult Health

## 2020-08-29 ENCOUNTER — Ambulatory Visit (INDEPENDENT_AMBULATORY_CARE_PROVIDER_SITE_OTHER): Payer: Medicare Other | Admitting: Adult Health

## 2020-08-29 VITALS — BP 128/80 | HR 63 | Temp 97.8°F | Ht 66.0 in | Wt 182.6 lb

## 2020-08-29 DIAGNOSIS — F5104 Psychophysiologic insomnia: Secondary | ICD-10-CM

## 2020-08-29 DIAGNOSIS — E782 Mixed hyperlipidemia: Secondary | ICD-10-CM

## 2020-08-29 DIAGNOSIS — I10 Essential (primary) hypertension: Secondary | ICD-10-CM

## 2020-08-29 DIAGNOSIS — E119 Type 2 diabetes mellitus without complications: Secondary | ICD-10-CM | POA: Diagnosis not present

## 2020-08-29 DIAGNOSIS — R42 Dizziness and giddiness: Secondary | ICD-10-CM | POA: Diagnosis not present

## 2020-08-29 DIAGNOSIS — T148XXA Other injury of unspecified body region, initial encounter: Secondary | ICD-10-CM

## 2020-08-29 DIAGNOSIS — S30810A Abrasion of lower back and pelvis, initial encounter: Secondary | ICD-10-CM

## 2020-08-29 NOTE — Patient Instructions (Addendum)
It was great seeing you today   Please schedule lab work for tomorrow   Order Mepilex boarder bandage off of [employer]

## 2020-08-29 NOTE — Progress Notes (Signed)
Subjective:    Patient ID: Ian [name], male    DOB: 08-Jul-1941, 79 y.o.   MRN: [id number]  HPI  79 year old male who  has a past medical history of Allergy, Apnea, sleep (07/28/07), Carotid artery stenosis, Coronary heart disease (2008), Diabetes mellitus, GERD (gastroesophageal reflux disease), Hyperlipidemia, Hypertension, and Hypospadias.  Ian [name] presents to the office today for follow-up, spent his summer in [place].  He was seen by cardiology there and had no new medication changes.  He would like to have some labs done today due to history of diabetes which he takes Metformin 1000 mg in the morning and 500 mg in the evening.  He also takes Crestor 40 mg daily for hyperlipidemia and blood pressure is controlled with doxazosin 2 mg at bedtime Benicar 20 mg daily.  The last year or so he has had intermittent episodes of dizziness/lightheadedness when he changes positions.  This momentarily feel it makes him feel off balance but he has not fallen.  We have investigated different causes including hypotension and hypoglycemia and have played around with his medications to no avail.  Unfortunately this continues to happen.  He does take Ambien nightly and Ativan 2.5 mg very sparingly for anxiety.  This generally, he has a small sore at the top of his gluteal cleft that he would like for me to look at.  The sore has been present for 3 to 4 months.  Review of Systems See HPI   Past Medical History:  Diagnosis Date  . Allergy   . Apnea, sleep 07/28/07   NPSG [place], AHI 79.9  . Carotid artery stenosis    mild 04/2010, 05/2012  . Coronary heart disease 2008   Cath 2008, 60-70% proximal diagonal 1 stenosis,  RCA 40% stenosis.  nonischemic Lexiscan 03/2011  . Diabetes mellitus    type II  . GERD (gastroesophageal reflux disease)   . Hyperlipidemia   . Hypertension   . Hypospadias     Social History   Socioeconomic History  . Marital status: Married    Spouse name: Not on file  .  Number of children: 3  . Years of education: Not on file  . Highest education level: Not on file  Occupational History  . Occupation: Retired Games developer: RETIRED  Tobacco Use  . Smoking status: Never Smoker  . Smokeless tobacco: Never Used  Substance and Sexual Activity  . Alcohol use: No    Alcohol/week: 0.0 standard drinks  . Drug use: No  . Sexual activity: Not on file  Other Topics Concern  . Not on file  Social History Narrative   Married.  Three children from first marriage.  First wife died of ovarian cancer.     Retired, did work for [employer] for 44 years.    Social Determinants of Health   Financial Resource Strain:   . Difficulty of Paying Living Expenses: Not on file  Food Insecurity:   . Worried About Charity fundraiser in the Last Year: Not on file  . Ran Out of Food in the Last Year: Not on file  Transportation Needs:   . Lack of Transportation (Medical): Not on file  . Lack of Transportation (Non-Medical): Not on file  Physical Activity:   . Days of Exercise per Week: Not on file  . Minutes of Exercise per Session: Not on file  Stress:   . Feeling of Stress : Not on file  Social Connections:   .  Frequency of Communication with Friends and Family: Not on file  . Frequency of Social Gatherings with Friends and Family: Not on file  . Attends Religious Services: Not on file  . Active Member of Clubs or Organizations: Not on file  . Attends Archivist Meetings: Not on file  . Marital Status: Not on file  Intimate Partner Violence:   . Fear of Current or Ex-Partner: Not on file  . Emotionally Abused: Not on file  . Physically Abused: Not on file  . Sexually Abused: Not on file    Past Surgical History:  Procedure Laterality Date  . NASAL SINUS SURGERY    . NISSEN FUNDOPLICATION    . TONSILLECTOMY    . VEIN LIGATION AND STRIPPING      Family History  Problem Relation Age of Onset  . Coronary artery disease Father 37        CABG  . Dementia Mother     Allergies  Allergen Reactions  . Iohexol      Desc: pt. states severe reaction, dr. told patient never to have the dye again     Current Outpatient Medications on File Prior to Visit  Medication Sig Dispense Refill  . aspirin 81 MG tablet Take 1 tablet (81 mg total) by mouth daily. 90 tablet 3  . Coenzyme Q10 (CO Q10) 100 MG CAPS Take 1 tablet by mouth daily.    Marland Kitchen doxazosin (CARDURA) 2 MG tablet Take 1 tablet (2 mg total) by mouth at bedtime. 90 tablet 3  . econazole nitrate 1 % cream Apply topically daily. 15 g 2  . fexofenadine (ALLEGRA) 180 MG tablet Take 1 tablet (180 mg total) by mouth daily. 90 tablet 3  . fluticasone (FLONASE) 50 MCG/ACT nasal spray USE 2 SPRAYS IN EACH NOSTRIL DAILY 48 g 3  . gabapentin (NEURONTIN) 300 MG capsule Take 1-2 capsules in the evening 180 capsule 3  . GLUCOSAMINE-CHONDROITIN PO Take by mouth. 1500 mg glucosamine and 1200 mg of Chondroitin TWICE DAILY    . hydrocortisone 2.5 % cream Apply topically 2 (two) times daily. 30 g 3  . Lancets (ONETOUCH ULTRASOFT) lancets USE TO TEST BLOOD GLUCOSE TWICE DAILY 200 each 3  . LORazepam (ATIVAN) 0.5 MG tablet TAKE 1 TABLET ONCE AS NEEDED FOR ANXIETY 30 tablet 1  . metFORMIN (GLUCOPHAGE) 500 MG tablet Take 2 tablets with AM meal and 1 tablet with PM meal 270 tablet 1  . olmesartan (BENICAR) 20 MG tablet Take 1 tablet (20 mg total) by mouth daily. 90 tablet 3  . Omega-3 Fatty Acids (FISH OIL ULTRA) 1400 MG CAPS Take by mouth.    . terbinafine (LAMISIL) 250 MG tablet Take 1 tablet (250 mg total) by mouth daily. 30 tablet 0  . traMADol (ULTRAM) 50 MG tablet TAKE 1 TABLET EVERY 8 HOURS AS NEEDED 90 tablet 0  . zolpidem (AMBIEN) 5 MG tablet TAKE 1 TABLET AT BEDTIME AS NEEDED FOR SLEEP 90 tablet 0  . rosuvastatin (CRESTOR) 40 MG tablet Take 1 tablet (40 mg total) by mouth daily. 90 tablet 3   No current facility-administered medications on file prior to visit.    BP 128/80 (BP  Location: Left Arm, Patient Position: Sitting, Cuff Size: Normal)   Pulse 63   Temp 97.8 F (36.6 C) (Oral)   Ht 5' 6"  (1.676 m)   Wt 182 lb 9.6 oz (82.8 kg)   BMI 29.47 kg/m       Objective:   Physical Exam  Vitals and nursing note reviewed.  Constitutional:      Appearance: Normal appearance.  HENT:     Right Ear: Tympanic membrane, ear canal and external ear normal. There is no impacted cerumen.     Left Ear: Ear canal and external ear normal. There is no impacted cerumen.  Cardiovascular:     Rate and Rhythm: Normal rate and regular rhythm.     Pulses: Normal pulses.     Heart sounds: Normal heart sounds.  Pulmonary:     Effort: Pulmonary effort is normal.     Breath sounds: Normal breath sounds.  Musculoskeletal:        General: Normal range of motion.  Skin:    General: Skin is warm and dry.     Comments: Small skin abrasion at the top of his gluteal cleft.  No signs of infection noted  Neurological:     General: No focal deficit present.     Mental Status: He is alert and oriented to person, place, and time.  Psychiatric:        Mood and Affect: Mood normal.        Behavior: Behavior normal.        Thought Content: Thought content normal.       Assessment & Plan:  1. Diabetes mellitus without complication ([place]) - Consider dose change in metformin  - POC Urinalysis Dipstick; Future - CMP with eGFR([facility]); Future - CBC with Differential/Platelet; Future - Hemoglobin A1c; Future - Lipid panel; Future  2. Mixed hyperlipidemia -Consider dose change in statin  - CMP with eGFR([facility]); Future - CBC with Differential/Platelet; Future - Hemoglobin A1c; Future - Lipid panel; Future  3. Essential hypertension -No change in medication BP well controlled - POC Urinalysis Dipstick; Future - CMP with eGFR([facility]); Future - CBC with Differential/Platelet; Future - Hemoglobin A1c; Future - Lipid panel; Future  4. Lightheadedness -Unknown cause hand has been worked  up in the past.  He does not want to do an MRI of the head at this time - CMP with eGFR([facility]); Future - CBC with Differential/Platelet; Future - Hemoglobin A1c; Future - Lipid panel; Future  5. Chronic insomnia -Continue with Ambien nightly   6. Skin abrasion -Possibly from pressure.  Advised sitting on pillow, not sitting for long periods of time in the same position and can use Mepilex border or some other pressure relieving bandage to help the wound heal  Dorothyann Peng, NP

## 2020-08-30 ENCOUNTER — Other Ambulatory Visit (INDEPENDENT_AMBULATORY_CARE_PROVIDER_SITE_OTHER): Payer: Medicare Other

## 2020-08-30 DIAGNOSIS — R319 Hematuria, unspecified: Secondary | ICD-10-CM

## 2020-08-30 DIAGNOSIS — R42 Dizziness and giddiness: Secondary | ICD-10-CM

## 2020-08-30 DIAGNOSIS — E119 Type 2 diabetes mellitus without complications: Secondary | ICD-10-CM | POA: Diagnosis not present

## 2020-08-30 DIAGNOSIS — I10 Essential (primary) hypertension: Secondary | ICD-10-CM

## 2020-08-30 DIAGNOSIS — E782 Mixed hyperlipidemia: Secondary | ICD-10-CM

## 2020-08-30 LAB — POCT URINALYSIS DIPSTICK
Bilirubin, UA: NEGATIVE
Glucose, UA: NEGATIVE
Ketones, UA: NEGATIVE
Leukocytes, UA: NEGATIVE
Nitrite, UA: NEGATIVE
Protein, UA: POSITIVE — AB
Spec Grav, UA: 1.015 (ref 1.010–1.025)
Urobilinogen, UA: 0.2 E.U./dL
pH, UA: 6 (ref 5.0–8.0)

## 2020-08-30 NOTE — Addendum Note (Signed)
Addended by: Marrion Coy on: 08/30/2020 09:05 AM   Modules accepted: Orders

## 2020-08-30 NOTE — Addendum Note (Signed)
Addended by: Marrion Coy on: 08/30/2020 09:04 AM   Modules accepted: Orders

## 2020-08-31 LAB — CBC WITH DIFFERENTIAL/PLATELET
Absolute Monocytes: 398 cells/uL (ref 200–950)
Basophils Absolute: 41 cells/uL (ref 0–200)
Basophils Relative: 0.8 %
Eosinophils Absolute: 388 cells/uL (ref 15–500)
Eosinophils Relative: 7.6 %
HCT: 43.5 % (ref 38.5–50.0)
Hemoglobin: 14 g/dL (ref 13.2–17.1)
Lymphs Abs: 1851 cells/uL (ref 850–3900)
MCH: 29.4 pg (ref 27.0–33.0)
MCHC: 32.2 g/dL (ref 32.0–36.0)
MCV: 91.4 fL (ref 80.0–100.0)
MPV: 11 fL (ref 7.5–12.5)
Monocytes Relative: 7.8 %
Neutro Abs: 2423 cells/uL (ref 1500–7800)
Neutrophils Relative %: 47.5 %
Platelets: 184 10*3/uL (ref 140–400)
RBC: 4.76 10*6/uL (ref 4.20–5.80)
RDW: 12.5 % (ref 11.0–15.0)
Total Lymphocyte: 36.3 %
WBC: 5.1 10*3/uL (ref 3.8–10.8)

## 2020-08-31 LAB — COMPLETE METABOLIC PANEL WITH GFR
AG Ratio: 2 (calc) (ref 1.0–2.5)
ALT: 17 U/L (ref 9–46)
AST: 21 U/L (ref 10–35)
Albumin: 4.2 g/dL (ref 3.6–5.1)
Alkaline phosphatase (APISO): 38 U/L (ref 35–144)
BUN/Creatinine Ratio: 19 (calc) (ref 6–22)
BUN: 30 mg/dL — ABNORMAL HIGH (ref 7–25)
CO2: 24 mmol/L (ref 20–32)
Calcium: 9.4 mg/dL (ref 8.6–10.3)
Chloride: 109 mmol/L (ref 98–110)
Creat: 1.57 mg/dL — ABNORMAL HIGH (ref 0.70–1.11)
GFR, Est African American: 47 mL/min/{1.73_m2} — ABNORMAL LOW (ref >=60)
GFR, Est Non African American: 41 mL/min/{1.73_m2} — ABNORMAL LOW (ref >=60)
Globulin: 2.1 g/dL (calc) (ref 1.9–3.7)
Glucose, Bld: 113 mg/dL — ABNORMAL HIGH (ref 65–99)
Potassium: 5 mmol/L (ref 3.5–5.3)
Sodium: 140 mmol/L (ref 135–146)
Total Bilirubin: 0.9 mg/dL (ref 0.2–1.2)
Total Protein: 6.3 g/dL (ref 6.1–8.1)

## 2020-08-31 LAB — LIPID PANEL
Cholesterol: 127 mg/dL (ref <200)
HDL: 45 mg/dL (ref >=40)
LDL Cholesterol (Calc): 64 mg/dL (calc)
Non-HDL Cholesterol (Calc): 82 mg/dL (calc) (ref <130)
Total CHOL/HDL Ratio: 2.8 (calc) (ref <5.0)
Triglycerides: 99 mg/dL (ref <150)

## 2020-08-31 LAB — HEMOGLOBIN A1C
Hgb A1c MFr Bld: 6.4 % of total Hgb — ABNORMAL HIGH (ref <5.7)
Mean Plasma Glucose: 137 (calc)
eAG (mmol/L): 7.6 (calc)

## 2020-08-31 LAB — URINE CULTURE
MICRO NUMBER:: 11107236
Result:: NO GROWTH
SPECIMEN QUALITY:: ADEQUATE

## 2020-09-03 ENCOUNTER — Other Ambulatory Visit: Payer: Self-pay | Admitting: Adult Health

## 2020-09-03 DIAGNOSIS — N289 Disorder of kidney and ureter, unspecified: Secondary | ICD-10-CM

## 2020-09-04 ENCOUNTER — Other Ambulatory Visit: Payer: Self-pay | Admitting: Adult Health

## 2020-09-06 ENCOUNTER — Other Ambulatory Visit: Payer: Medicare Other

## 2020-09-06 ENCOUNTER — Other Ambulatory Visit: Payer: Self-pay

## 2020-09-06 DIAGNOSIS — R809 Proteinuria, unspecified: Secondary | ICD-10-CM

## 2020-09-06 DIAGNOSIS — N289 Disorder of kidney and ureter, unspecified: Secondary | ICD-10-CM

## 2020-09-06 LAB — POCT URINALYSIS DIPSTICK
Bilirubin, UA: NEGATIVE
Glucose, UA: NEGATIVE
Ketones, UA: NEGATIVE
Leukocytes, UA: NEGATIVE
Nitrite, UA: NEGATIVE
Protein, UA: POSITIVE — AB
Spec Grav, UA: 1.02 (ref 1.010–1.025)
Urobilinogen, UA: 0.2 E.U./dL
pH, UA: 5.5 (ref 5.0–8.0)

## 2020-09-10 NOTE — Addendum Note (Signed)
Addended by: Agnes Lawrence on: 09/10/2020 03:45 PM   Modules accepted: Orders

## 2020-09-25 DIAGNOSIS — N182 Chronic kidney disease, stage 2 (mild): Secondary | ICD-10-CM | POA: Insufficient documentation

## 2020-09-25 DIAGNOSIS — R42 Dizziness and giddiness: Secondary | ICD-10-CM | POA: Insufficient documentation

## 2020-09-25 NOTE — Progress Notes (Signed)
Cardiology Office Note   Date:  09/26/2020   ID:  Ian [name], DOB [birth year], MRN [id number]  PCP:  Dorothyann Peng, NP  Cardiologist:   Minus Breeding, MD  Referring:  Dorothyann Peng, NP  No chief complaint on file.    History of Present Illness: Ian [name] is a 79 y.o. male who presents for follow up of CAD. He had a heart catheterization in 2018.  He had a well-preserved ejection fraction.  He had a left main 30% stenosis.  He had some mild plaque in his LAD with a 60 to 70% lesion in the first diagonal.  He had a small nondominant right.  There is no mention of any significant circumflex stenosis.  He had a stress perfusion study in 2010.  Demonstrated no significant abnormalities.  Another stress study in 2012 was unremarkable.  Stress echo in 2014 and most recently in 2019 were both unremarkable.  He is sinus bradycardia.  He had right bundle branch block.  Is been followed for very mild carotid plaque.  Since I last saw him he travels back and forth to [place].  He did see his other cardiologist in [place] this summer.  There were no change in therapies or further testing done.  He has done well except he has noticed some increased shortness of breath when he is climbing up an incline here.  He has to stop midway up the heel that he walks most days.  He gets short of breath.  He catches his breath and can go on.  He does not have any resting shortness of breath, PND or orthopnea.  He does having palpitations, presyncope or syncope.  He denies any chest pressure, neck or arm discomfort.  He has had no weight gain or edema.  He does still describe occasional dizziness which lasts very briefly.  It is not positional.   Past Medical History:  Diagnosis Date  . Allergy   . Apnea, sleep 07/28/07   NPSG [place], AHI 79.9  . Carotid artery stenosis    mild 04/2010, 05/2012  . Coronary heart disease 2008   Cath 2008, 60-70% proximal diagonal 1 stenosis,  RCA 40% stenosis.   nonischemic Lexiscan 03/2011  . Diabetes mellitus    type II  . GERD (gastroesophageal reflux disease)   . Hyperlipidemia   . Hypertension   . Hypospadias     Past Surgical History:  Procedure Laterality Date  . NASAL SINUS SURGERY    . NISSEN FUNDOPLICATION    . TONSILLECTOMY    . VEIN LIGATION AND STRIPPING       Current Outpatient Medications  Medication Sig Dispense Refill  . aspirin 81 MG tablet Take 1 tablet (81 mg total) by mouth daily. 90 tablet 3  . Coenzyme Q10 (CO Q10) 100 MG CAPS Take 1 tablet by mouth daily.    Marland Kitchen doxazosin (CARDURA) 2 MG tablet Take 1 tablet (2 mg total) by mouth at bedtime. 90 tablet 3  . econazole nitrate 1 % cream Apply topically daily. 15 g 2  . fexofenadine (ALLEGRA) 180 MG tablet Take 1 tablet (180 mg total) by mouth daily. 90 tablet 3  . fluticasone (FLONASE) 50 MCG/ACT nasal spray USE 2 SPRAYS IN EACH NOSTRIL DAILY 48 g 3  . gabapentin (NEURONTIN) 300 MG capsule Take 1-2 capsules in the evening 180 capsule 3  . GLUCOSAMINE-CHONDROITIN PO Take by mouth. 1500 mg glucosamine and 1200 mg of Chondroitin TWICE DAILY    .  hydrocortisone 2.5 % cream Apply topically 2 (two) times daily. 30 g 3  . Lancets (ONETOUCH ULTRASOFT) lancets USE TO TEST BLOOD GLUCOSE TWICE DAILY 200 each 3  . LORazepam (ATIVAN) 0.5 MG tablet TAKE 1 TABLET ONCE AS NEEDED FOR ANXIETY 30 tablet 1  . metFORMIN (GLUCOPHAGE) 500 MG tablet Take 2 tablets with AM meal and 1 tablet with PM meal 270 tablet 1  . olmesartan (BENICAR) 20 MG tablet Take 1 tablet (20 mg total) by mouth daily. 90 tablet 3  . Omega-3 Fatty Acids (FISH OIL ULTRA) 1400 MG CAPS Take by mouth.    . terbinafine (LAMISIL) 250 MG tablet Take 1 tablet (250 mg total) by mouth daily. 30 tablet 0  . traMADol (ULTRAM) 50 MG tablet TAKE 1 TABLET EVERY 8 HOURS AS NEEDED 90 tablet 0  . zolpidem (AMBIEN) 5 MG tablet TAKE 1 TABLET AT BEDTIME AS NEEDED FOR SLEEP 90 tablet 0  . nitroGLYCERIN (NITROSTAT) 0.4 MG SL tablet Place 1  tablet (0.4 mg total) under the tongue every 5 (five) minutes as needed for chest pain. 90 tablet 3  . rosuvastatin (CRESTOR) 40 MG tablet Take 1 tablet (40 mg total) by mouth daily. 90 tablet 3   No current facility-administered medications for this visit.    Allergies:   Iohexol    ROS:  Please see the history of present illness.   Otherwise, review of systems are positive for none.   All other systems are reviewed and negative.    PHYSICAL EXAM: VS:  BP 130/63   Pulse (!) 59   Temp (!) 96.8 F (36 C)   Ht 5\' 6"  (1.676 m)   Wt 187 lb 3.2 oz (84.9 kg)   SpO2 97%   BMI 30.21 kg/m  , BMI Body mass index is 30.21 kg/m.  GENERAL:  Well appearing NECK:  No jugular venous distention, waveform within normal limits, carotid upstroke brisk and symmetric, no bruits, no thyromegaly LUNGS:  Clear to auscultation bilaterally CHEST:  Unremarkable HEART:  PMI not displaced or sustained,S1 and S2 within normal limits, no S3, no S4, no clicks, no rubs, no murmurs ABD:  Flat, positive bowel sounds normal in frequency in pitch, no bruits, no rebound, no guarding, no midline pulsatile mass, no hepatomegaly, no splenomegaly EXT:  2 plus pulses throughout, no edema, no cyanosis no clubbing    EKG:  EKG is  ordered today. Sinus rhythm, right bundle branch block, rate 59, intervals within normal limits, no acute ST-T wave changes.  Recent Labs: 08/30/2020: ALT 17; BUN 30; Creat 1.57; Hemoglobin 14.0; Platelets 184; Potassium 5.0; Sodium 140    Lipid Panel    Component Value Date/Time   CHOL 127 08/30/2020 0845   CHOL 134 04/12/2020 1217   TRIG 99 08/30/2020 0845   HDL 45 08/30/2020 0845   HDL 47 04/12/2020 1217   CHOLHDL 2.8 08/30/2020 0845   VLDL 27.4 02/20/2020 1339   LDLCALC 64 08/30/2020 0845     Lab Results  Component Value Date   CREATININE 1.57 (H) 08/30/2020    Wt Readings from Last 3 Encounters:  09/26/20 187 lb 3.2 oz (84.9 kg)  08/29/20 182 lb 9.6 oz (82.8 kg)    02/20/20 184 lb (83.5 kg)      Other studies Reviewed: Additional studies/ records that were reviewed today include: Labs Review of the above records demonstrates:   See above.   ASSESSMENT AND PLAN:  CAD:     He has disease as described  above and does have some dyspnea on exertion.  I am going to bring him back and screen him with a POET (Plain Old Exercise Treadmill) looking for high risk features.   HTN:   The blood pressure is controlled.  No change in therapy.   DYSLIPIDEMIA:   LDL was 64.  HDL 45.  No change in therapy.   DM:  His A1c was 6.4.  This is followed by  Dorothyann Peng, NP   CKD:   Creatinine was 1.57 and he does see nephrology.   DIZZINESS:   We talked about this at length.  This is very brief lasting for a few seconds at a time.  It is not positional.  I do not think it has anything to do with his medications.  It could be an arrhythmia but he thinks it infrequent enough that we are not going to pursue further evaluation unless it becomes more problematic.   Current medicines are reviewed at length with the patient today.  The patient does not have concerns regarding medicines.  The following changes have been made: None  Labs/ tests ordered today include:   Orders Placed This Encounter  Procedures  . EKG 12-Lead     Disposition:   FU with me in 12 months.     Signed, Minus Breeding, MD  09/26/2020 11:40 AM    [place]

## 2020-09-26 ENCOUNTER — Encounter: Payer: Self-pay | Admitting: Cardiology

## 2020-09-26 ENCOUNTER — Other Ambulatory Visit: Payer: Self-pay

## 2020-09-26 ENCOUNTER — Ambulatory Visit (INDEPENDENT_AMBULATORY_CARE_PROVIDER_SITE_OTHER): Payer: Medicare Other | Admitting: Cardiology

## 2020-09-26 VITALS — BP 130/63 | HR 59 | Temp 96.8°F | Ht 66.0 in | Wt 187.2 lb

## 2020-09-26 DIAGNOSIS — R0602 Shortness of breath: Secondary | ICD-10-CM

## 2020-09-26 DIAGNOSIS — N182 Chronic kidney disease, stage 2 (mild): Secondary | ICD-10-CM

## 2020-09-26 DIAGNOSIS — E118 Type 2 diabetes mellitus with unspecified complications: Secondary | ICD-10-CM

## 2020-09-26 DIAGNOSIS — E785 Hyperlipidemia, unspecified: Secondary | ICD-10-CM

## 2020-09-26 DIAGNOSIS — I2510 Atherosclerotic heart disease of native coronary artery without angina pectoris: Secondary | ICD-10-CM | POA: Diagnosis not present

## 2020-09-26 DIAGNOSIS — I10 Essential (primary) hypertension: Secondary | ICD-10-CM

## 2020-09-26 DIAGNOSIS — R42 Dizziness and giddiness: Secondary | ICD-10-CM

## 2020-09-26 MED ORDER — NITROGLYCERIN 0.4 MG SL SUBL: 0.4000 mg | SUBLINGUAL_TABLET | SUBLINGUAL | 3 refills | Status: DC | PRN

## 2020-09-26 NOTE — Patient Instructions (Signed)
Medication Instructions:  [facility]itroglycerin [facility] *If you need a refill on your cardiac medications before your next appointment, please call your pharmacy*  Lab Work: Covid screening 3 days before the exercise tolerance test. You will need to quarantine after the screening until the test is complete.  Testing/Procedures: Your physician has requested that you have an exercise tolerance test. For further information please visit [email or web address]. Please also follow instruction sheet, as given.  Follow-Up: At [facility], you and your health needs are our priority.  As part of our continuing mission to provide you with exceptional heart care, we have created designated Provider Care Teams.  These Care Teams include your primary Cardiologist (physician) and Advanced Practice Providers (APPs -  Physician Assistants and Nurse Practitioners) who all work together to provide you with the care you need, when you need it.  Your next appointment:   12 month(s)  You will receive a reminder letter in the mail two months in advance. If you don't receive a letter, please call our office to schedule the follow-up appointment.  The format for your next appointment:   In Person  Provider:   Minus Breeding, MD

## 2020-10-02 ENCOUNTER — Other Ambulatory Visit: Payer: Self-pay | Admitting: Nephrology

## 2020-10-02 DIAGNOSIS — N1831 Chronic kidney disease, stage 3a: Secondary | ICD-10-CM

## 2020-10-10 ENCOUNTER — Other Ambulatory Visit: Payer: Self-pay | Admitting: Adult Health

## 2020-10-10 DIAGNOSIS — Z760 Encounter for issue of repeat prescription: Secondary | ICD-10-CM

## 2020-10-14 ENCOUNTER — Other Ambulatory Visit (HOSPITAL_COMMUNITY)
Admission: RE | Admit: 2020-10-14 | Discharge: 2020-10-14 | Disposition: A | Discharge status: Home / Self Care | Payer: Medicare Other | Source: Ambulatory Visit | Attending: Nephrology | Admitting: Nephrology

## 2020-10-14 DIAGNOSIS — Z20822 Contact with and (suspected) exposure to covid-19: Secondary | ICD-10-CM | POA: Diagnosis not present

## 2020-10-14 DIAGNOSIS — Z01812 Encounter for preprocedural laboratory examination: Secondary | ICD-10-CM | POA: Diagnosis present

## 2020-10-14 LAB — SARS CORONAVIRUS 2 (TAT 6-24 HRS): SARS Coronavirus 2: NEGATIVE

## 2020-10-15 ENCOUNTER — Ambulatory Visit (INDEPENDENT_AMBULATORY_CARE_PROVIDER_SITE_OTHER): Payer: Medicare Other

## 2020-10-15 DIAGNOSIS — Z0000 Encounter for general adult medical examination without abnormal findings: Secondary | ICD-10-CM

## 2020-10-15 NOTE — Progress Notes (Signed)
Virtual Visit via Telephone Note  I connected with  Ian [name] on 10/15/20 at 11:00 AM EST by telephone and verified that I am speaking with the correct person using two identifiers.  Medicare Annual Wellness visit completed telephonically due to Covid-19 pandemic.   Persons participating in this call: This Health Coach and this patient.   Location: Patient: Home Provider: Office    I discussed the limitations, risks, security and privacy concerns of performing an evaluation and management service by telephone and the availability of in person appointments. The patient expressed understanding and agreed to proceed.  Unable to perform video visit due to video visit attempted and failed and/or patient does not have video capability.   Some vital signs may be absent or patient reported.   Willette Brace, LPN    Subjective:   Ian [name] is a 79 y.o. male who presents for Medicare Annual/Subsequent preventive examination.  Review of Systems     Cardiac Risk Factors include: advanced age (>46men, >54 women);diabetes mellitus;dyslipidemia;hypertension;male gender;obesity (BMI >30kg/m2)     Objective:    Today's Vitals   10/15/20 1118  PainSc: 3    There is no height or weight on file to calculate BMI.  Advanced Directives 10/15/2020  Does Patient Have a Medical Advance Directive? Yes  Type of Advance Directive Living will    Current Medications (verified) Outpatient Encounter Medications as of 10/15/2020  Medication Sig  . aspirin 81 MG tablet Take 1 tablet (81 mg total) by mouth daily.  . Coenzyme Q10 (CO Q10) 100 MG CAPS Take 1 tablet by mouth daily.  Marland Kitchen doxazosin (CARDURA) 2 MG tablet Take 1 tablet (2 mg total) by mouth at bedtime.  Marland Kitchen econazole nitrate 1 % cream Apply topically daily.  . fexofenadine (ALLEGRA) 180 MG tablet Take 1 tablet (180 mg total) by mouth daily.  . fluticasone (FLONASE) 50 MCG/ACT nasal spray USE 2 SPRAYS IN EACH NOSTRIL DAILY  .  gabapentin (NEURONTIN) 300 MG capsule TAKE 1 TO 2 CAPSULES IN THE EVENING  . GLUCOSAMINE-CHONDROITIN PO Take by mouth. 1500 mg glucosamine and 1200 mg of Chondroitin TWICE DAILY  . hydrocortisone 2.5 % cream Apply topically 2 (two) times daily.  . Lancets (ONETOUCH ULTRASOFT) lancets USE TO TEST BLOOD GLUCOSE TWICE DAILY  . LORazepam (ATIVAN) 0.5 MG tablet TAKE 1 TABLET ONCE AS NEEDED FOR ANXIETY  . metFORMIN (GLUCOPHAGE) 500 MG tablet Take 2 tablets with AM meal and 1 tablet with PM meal  . nitroGLYCERIN (NITROSTAT) 0.4 MG SL tablet Place 1 tablet (0.4 mg total) under the tongue every 5 (five) minutes as needed for chest pain.  Marland Kitchen olmesartan (BENICAR) 20 MG tablet Take 1 tablet (20 mg total) by mouth daily.  . Omega-3 Fatty Acids (FISH OIL ULTRA) 1400 MG CAPS Take by mouth.  . terbinafine (LAMISIL) 250 MG tablet Take 1 tablet (250 mg total) by mouth daily.  . traMADol (ULTRAM) 50 MG tablet TAKE 1 TABLET EVERY 8 HOURS AS NEEDED  . zolpidem (AMBIEN) 5 MG tablet TAKE 1 TABLET AT BEDTIME AS NEEDED FOR SLEEP  . rosuvastatin (CRESTOR) 40 MG tablet Take 1 tablet (40 mg total) by mouth daily.  . [DISCONTINUED] olmesartan (BENICAR) 40 MG tablet Take by mouth. (Patient not taking: Reported on 10/15/2020)   No facility-administered encounter medications on file as of 10/15/2020.    Allergies (verified) Iohexol   History: Past Medical History:  Diagnosis Date  . Allergy   . Apnea, sleep 07/28/07   NPSG  [place], AHI 79.9  . Carotid artery stenosis    mild 04/2010, 05/2012  . Coronary heart disease 2008   Cath 2008, 60-70% proximal diagonal 1 stenosis,  RCA 40% stenosis.  nonischemic Lexiscan 03/2011  . Diabetes mellitus    type II  . GERD (gastroesophageal reflux disease)   . Hyperlipidemia   . Hypertension   . Hypospadias    Past Surgical History:  Procedure Laterality Date  . NASAL SINUS SURGERY    . NISSEN FUNDOPLICATION    . TONSILLECTOMY    . VEIN LIGATION AND STRIPPING     Family  History  Problem Relation Age of Onset  . Coronary artery disease Father 19       CABG  . Dementia Mother    Social History   Socioeconomic History  . Marital status: Married    Spouse name: Not on file  . Number of children: 3  . Years of education: Not on file  . Highest education level: Not on file  Occupational History  . Occupation: Retired Games developer: RETIRED  Tobacco Use  . Smoking status: Never Smoker  . Smokeless tobacco: Never Used  Substance and Sexual Activity  . Alcohol use: No    Alcohol/week: 0.0 standard drinks  . Drug use: No  . Sexual activity: Not on file  Other Topics Concern  . Not on file  Social History Narrative   Married.  Three children from first marriage.  First wife died of ovarian cancer.     Retired, did work for [employer] for 44 years.    Social Determinants of Health   Financial Resource Strain: Low Risk   . Difficulty of Paying Living Expenses: Not hard at all  Food Insecurity: No Food Insecurity  . Worried About Charity fundraiser in the Last Year: Never true  . Ran Out of Food in the Last Year: Never true  Transportation Needs: No Transportation Needs  . Lack of Transportation (Medical): No  . Lack of Transportation (Non-Medical): No  Physical Activity: Sufficiently Active  . Days of Exercise per Week: 5 days  . Minutes of Exercise per Session: 50 min  Stress: No Stress Concern Present  . Feeling of Stress : Not at all  Social Connections: Moderately Integrated  . Frequency of Communication with Friends and Family: More than three times a week  . Frequency of Social Gatherings with Friends and Family: Once a week  . Attends Religious Services: More than 4 times per year  . Active Member of Clubs or Organizations: No  . Attends Archivist Meetings: Never  . Marital Status: Married    Tobacco Counseling Counseling given: Not Answered   Clinical Intake:  Pre-visit preparation completed:  Yes  Pain : 0-10 (3) Pain Score: 3  (lower back) Pain Type: Chronic pain Pain Location: Back Pain Orientation: Lower Pain Descriptors / Indicators: Sore Pain Onset: More than a month ago Pain Frequency: Constant     BMI - recorded: 30.23 Nutritional Status: BMI > 30  Obese Nutritional Risks: None Diabetes: Yes CBG done?: No Did pt. bring in CBG monitor from home?: No  How often do you need to have someone help you when you read instructions, pamphlets, or other written materials from your doctor or pharmacy?: 1 - Never  Diabetic?Nutrition Risk Assessment:  Has the patient had any N/V/D within the last 2 months?  Yes  Does the patient have any non-healing wounds?  No  Has  the patient had any unintentional weight loss or weight gain?  No   Diabetes:  Is the patient diabetic?  Yes  If diabetic, was a CBG obtained today?  No  Did the patient bring in their glucometer from home?  No  How often do you monitor your CBG's? No monitoring.   Financial Strains and Diabetes Management:  Are you having any financial strains with the device, your supplies or your medication? No .  Does the patient want to be seen by Chronic Care Management for management of their diabetes?  No  Would the patient like to be referred to a Nutritionist or for Diabetic Management?  No   Diabetic Exams:  Diabetic Eye Exam: Completed 04/03/20 Diabetic Foot Exam: Overdue, Pt has been advised about the importance in completing this exam. Pt is scheduled for diabetic foot exam on postponed until 10/15/21.   Interpreter Needed?: No  Information entered by :: Charlott Rakes, LPN   Activities of Daily Living In your present state of health, do you have any difficulty performing the following activities: 10/15/2020  Hearing? N  Vision? N  Difficulty concentrating or making decisions? Y  Comment memory short term at times  Walking or climbing stairs? N  Dressing or bathing? N  Doing errands, shopping? N   Preparing Food and eating ? N  Using the Toilet? N  In the past six months, have you accidently leaked urine? N  Do you have problems with loss of bowel control? N  Managing your Medications? N  Managing your Finances? N  Housekeeping or managing your Housekeeping? N  Some recent data might be hidden    Patient Care Team: Dorothyann Peng, NP as PCP - General (Family Medicine)  Indicate any recent Medical Services you may have received from other than [facility] providers in the past year (date may be approximate).     Assessment:   This is a routine wellness examination for Ian [name].  Hearing/Vision screen  Hearing Screening   125Hz  250Hz  500Hz  1000Hz  2000Hz  3000Hz  4000Hz  6000Hz  8000Hz   Right ear:           Left ear:           Comments: Pt denies any hearing issues  Vision Screening Comments: Follow up with Dr Baldemar Lenis for annual eye exams  Dietary issues and exercise activities discussed: Current Exercise Habits: Home exercise routine, Type of exercise: walking, Time (Minutes): 40, Frequency (Times/Week): 5, Weekly Exercise (Minutes/Week): 200  Goals    . Patient Stated     Stay healthy      Depression Screen PHQ 2/9 Scores 10/15/2020 01/28/2018 10/24/2015 04/05/2015 04/05/2015 01/19/2014  PHQ - 2 Score 0 0 0 0 0 0    Fall Risk Fall Risk  10/15/2020 01/28/2018 10/24/2015 04/05/2015 04/05/2015  Falls in the past year? 0 No No No No  Number falls in past yr: 0 - - - -  Injury with Fall? 0 - - - -  Risk for fall due to : Impaired balance/gait;Impaired mobility;Impaired vision - - - -  Follow up Falls prevention discussed - - - -    FALL RISK PREVENTION PERTAINING TO THE HOME:  Any stairs in or around the home? No  If so, are there any without handrails? No  Home free of loose throw rugs in walkways, pet beds, electrical cords, etc? Yes  Adequate lighting in your home to reduce risk of falls? Yes   ASSISTIVE DEVICES UTILIZED TO PREVENT FALLS:  Life alert? No  Use of a  cane, walker or w/c? No  Grab bars in the bathroom? Yes  Shower chair or bench in shower? Yes  Elevated toilet seat or a handicapped toilet? No   TIMED UP AND GO:  Was the test performed? No .    Cognitive Function:     6CIT Screen 10/15/2020  What Year? 0 points  What month? 0 points  Count back from 20 0 points  Months in reverse 0 points  Repeat phrase 2 points    Immunizations Immunization History  Administered Date(s) Administered  . Hepatitis A, Adult 03/09/2017, 09/23/2017  . Influenza Split 08/15/2012  . Influenza Whole 08/17/2008, 07/23/2009, 08/19/2010  . Influenza, High Dose Seasonal PF 08/15/2013, 09/04/2014, 08/10/2015, 09/11/2016, 08/14/2017, 08/23/2018  . Influenza-Unspecified 07/24/2020  . PFIZER SARS-COV-2 Vaccination 12/15/2019, 01/09/2020, 08/09/2020  . Pneumococcal Conjugate-13 10/11/2013, 05/30/2014  . Pneumococcal Polysaccharide-23 08/09/2006, 07/23/2009  . Td 07/01/2009  . Zoster 09/12/2012  . Zoster Recombinat (Shingrix) 04/02/2017, 06/16/2017      Flu Vaccine status: Up to date  Pneumococcal vaccine status: Up to date  Covid-19 vaccine status: Completed vaccines  Qualifies for Shingles Vaccine? Yes   Zostavax completed Yes   Shingrix Completed?: Yes  Screening Tests Health Maintenance  Topic Date Due  . FOOT EXAM  10/15/2021 (Originally 12/03/2017)  . HEMOGLOBIN A1C  02/28/2021  . OPHTHALMOLOGY EXAM  04/03/2021  . INFLUENZA VACCINE  Completed  . COVID-19 Vaccine  Completed  . PNA vac Low Risk Adult  Completed    Health Maintenance  There are no preventive care reminders to display for this patient.  Colorectal cancer screening: No longer required.     Additional Screening:  Vision Screening: Recommended annual ophthalmology exams for early detection of glaucoma and other disorders of the eye. Is the patient up to date with their annual eye exam?  Yes  Who is the provider or what is the name of the office in which the  patient attends annual eye exams? Dr Nils Flack     Dental Screening: Recommended annual dental exams for proper oral hygiene  Community Resource Referral / Chronic Care Management: CRR required this visit?  No   CCM required this visit?  No      Plan:     I have personally reviewed and noted the following in the patient's chart:   . Medical and social history . Use of alcohol, tobacco or illicit drugs  . Current medications and supplements . Functional ability and status . Nutritional status . Physical activity . Advanced directives . List of other physicians . Hospitalizations, surgeries, and ER visits in previous 12 months . Vitals . Screenings to include cognitive, depression, and falls . Referrals and appointments  In addition, I have reviewed and discussed with patient certain preventive protocols, quality metrics, and best practice recommendations. A written personalized care plan for preventive services as well as general preventive health recommendations were provided to patient.     Willette Brace, LPN   [id number]   Nurse Notes: None

## 2020-10-15 NOTE — Patient Instructions (Signed)
Ian [name] , Thank you for taking time to come for your Medicare Wellness Visit. I appreciate your ongoing commitment to your health goals. Please review the following plan we discussed and let me know if I can assist you in the future.   Screening recommendations/referrals: Colonoscopy: no longer required Recommended yearly ophthalmology/optometry visit for glaucoma screening and checkup Recommended yearly dental visit for hygiene and checkup  Vaccinations: Influenza vaccine: Done 07/24/20 Up to date Pneumococcal vaccine: Up to date Shingles vaccine: Completed 5/25, 06/16/17   Covid-19: Completed 2/5, 3/2 & 08/09/20  Advanced directives: Please bring a copy of your health care power of attorney and living will to the office at your convenience.  Conditions/risks identified: stay Healthy  Next appointment: Follow up in one year for your annual wellness visit.   Preventive Care 74 Years and Older, Male Preventive care refers to lifestyle choices and visits with your health care provider that can promote health and wellness. What does preventive care include?  A yearly physical exam. This is also called an annual well check.  Dental exams once or twice a year.  Routine eye exams. Ask your health care provider how often you should have your eyes checked.  Personal lifestyle choices, including:  Daily care of your teeth and gums.  Regular physical activity.  Eating a healthy diet.  Avoiding tobacco and drug use.  Limiting alcohol use.  Practicing safe sex.  Taking low doses of aspirin every day.  Taking vitamin and mineral supplements as recommended by your health care provider. What happens during an annual well check? The services and screenings done by your health care provider during your annual well check will depend on your age, overall health, lifestyle risk factors, and family history of disease. Counseling  Your health care provider may ask you questions about  your:  Alcohol use.  Tobacco use.  Drug use.  Emotional well-being.  Home and relationship well-being.  Sexual activity.  Eating habits.  History of falls.  Memory and ability to understand (cognition).  Work and work Statistician. Screening  You may have the following tests or measurements:  Height, weight, and BMI.  Blood pressure.  Lipid and cholesterol levels. These may be checked every 5 years, or more frequently if you are over 59 years old.  Skin check.  Lung cancer screening. You may have this screening every year starting at age 34 if you have a 30-pack-year history of smoking and currently smoke or have quit within the past 15 years.  Fecal occult blood test (FOBT) of the stool. You may have this test every year starting at age 67.  Flexible sigmoidoscopy or colonoscopy. You may have a sigmoidoscopy every 5 years or a colonoscopy every 10 years starting at age 13.  Prostate cancer screening. Recommendations will vary depending on your family history and other risks.  Hepatitis C blood test.  Hepatitis B blood test.  Sexually transmitted disease (STD) testing.  Diabetes screening. This is done by checking your blood sugar (glucose) after you have not eaten for a while (fasting). You may have this done every 1-3 years.  Abdominal aortic aneurysm (AAA) screening. You may need this if you are a current or former smoker.  Osteoporosis. You may be screened starting at age 13 if you are at high risk. Talk with your health care provider about your test results, treatment options, and if necessary, the need for more tests. Vaccines  Your health care provider may recommend certain vaccines, such as:  Influenza vaccine. This is recommended every year.  Tetanus, diphtheria, and acellular pertussis (Tdap, Td) vaccine. You may need a Td booster every 10 years.  Zoster vaccine. You may need this after age 24.  Pneumococcal 13-valent conjugate (PCV13) vaccine.  One dose is recommended after age 9.  Pneumococcal polysaccharide (PPSV23) vaccine. One dose is recommended after age 72. Talk to your health care provider about which screenings and vaccines you need and how often you need them. This information is not intended to replace advice given to you by your health care provider. Make sure you discuss any questions you have with your health care provider. Document Released: 11/22/2015 Document Revised: 07/15/2016 Document Reviewed: 08/27/2015 Elsevier Interactive Patient Education  2017 [employer] Prevention in the Home Falls can cause injuries. They can happen to people of all ages. There are many things you can do to make your home safe and to help prevent falls. What can I do on the outside of my home?  Regularly fix the edges of walkways and driveways and fix any cracks.  Remove anything that might make you trip as you walk through a door, such as a raised step or threshold.  Trim any bushes or trees on the path to your home.  Use bright outdoor lighting.  Clear any walking paths of anything that might make someone trip, such as rocks or tools.  Regularly check to see if handrails are loose or broken. Make sure that both sides of any steps have handrails.  Any raised decks and porches should have guardrails on the edges.  Have any leaves, snow, or ice cleared regularly.  Use sand or salt on walking paths during winter.  Clean up any spills in your garage right away. This includes oil or grease spills. What can I do in the bathroom?  Use night lights.  Install grab bars by the toilet and in the tub and shower. Do not use towel bars as grab bars.  Use non-skid mats or decals in the tub or shower.  If you need to sit down in the shower, use a plastic, non-slip stool.  Keep the floor dry. Clean up any water that spills on the floor as soon as it happens.  Remove soap buildup in the tub or shower regularly.  Attach bath  mats securely with double-sided non-slip rug tape.  Do not have throw rugs and other things on the floor that can make you trip. What can I do in the bedroom?  Use night lights.  Make sure that you have a light by your bed that is easy to reach.  Do not use any sheets or blankets that are too big for your bed. They should not hang down onto the floor.  Have a firm chair that has side arms. You can use this for support while you get dressed.  Do not have throw rugs and other things on the floor that can make you trip. What can I do in the kitchen?  Clean up any spills right away.  Avoid walking on wet floors.  Keep items that you use a lot in easy-to-reach places.  If you need to reach something above you, use a strong step stool that has a grab bar.  Keep electrical cords out of the way.  Do not use floor polish or wax that makes floors slippery. If you must use wax, use non-skid floor wax.  Do not have throw rugs and other things on the floor that  can make you trip. What can I do with my stairs?  Do not leave any items on the stairs.  Make sure that there are handrails on both sides of the stairs and use them. Fix handrails that are broken or loose. Make sure that handrails are as long as the stairways.  Check any carpeting to make sure that it is firmly attached to the stairs. Fix any carpet that is loose or worn.  Avoid having throw rugs at the top or bottom of the stairs. If you do have throw rugs, attach them to the floor with carpet tape.  Make sure that you have a light switch at the top of the stairs and the bottom of the stairs. If you do not have them, ask someone to add them for you. What else can I do to help prevent falls?  Wear shoes that:  Do not have high heels.  Have rubber bottoms.  Are comfortable and fit you well.  Are closed at the toe. Do not wear sandals.  If you use a stepladder:  Make sure that it is fully opened. Do not climb a closed  stepladder.  Make sure that both sides of the stepladder are locked into place.  Ask someone to hold it for you, if possible.  Clearly mark and make sure that you can see:  Any grab bars or handrails.  First and last steps.  Where the edge of each step is.  Use tools that help you move around (mobility aids) if they are needed. These include:  Canes.  Walkers.  Scooters.  Crutches.  Turn on the lights when you go into a dark area. Replace any light bulbs as soon as they burn out.  Set up your furniture so you have a clear path. Avoid moving your furniture around.  If any of your floors are uneven, fix them.  If there are any pets around you, be aware of where they are.  Review your medicines with your doctor. Some medicines can make you feel dizzy. This can increase your chance of falling. Ask your doctor what other things that you can do to help prevent falls. This information is not intended to replace advice given to you by your health care provider. Make sure you discuss any questions you have with your health care provider. Document Released: 08/22/2009 Document Revised: 04/02/2016 Document Reviewed: 11/30/2014 Elsevier Interactive Patient Education  2017 [employer].

## 2020-10-16 LAB — HM DIABETES EYE EXAM

## 2020-10-17 ENCOUNTER — Encounter: Payer: Self-pay | Admitting: Adult Health

## 2020-10-17 ENCOUNTER — Ambulatory Visit
Admission: RE | Admit: 2020-10-17 | Discharge: 2020-10-17 | Disposition: A | Discharge status: Home / Self Care | Payer: Medicare Other | Source: Ambulatory Visit | Attending: Nephrology | Admitting: Nephrology

## 2020-10-17 DIAGNOSIS — N1831 Chronic kidney disease, stage 3a: Secondary | ICD-10-CM

## 2020-11-05 ENCOUNTER — Encounter: Payer: Self-pay | Admitting: Adult Health

## 2020-11-12 ENCOUNTER — Telehealth: Payer: Self-pay

## 2020-11-12 DIAGNOSIS — R0602 Shortness of breath: Secondary | ICD-10-CM

## 2020-11-12 NOTE — Telephone Encounter (Signed)
-----   Message from Darene Lamer, LPN sent at 11/14/1094  4:04 PM EST ----- Regarding: FW: ATTESTATION  ----- Message ----- From: Minette Brine Sent: 11/12/2020   4:00 PM EST To: [facility] Triage Subject: ATTESTATION                                    Please create and have MD sign ATTESTATION.

## 2020-11-12 NOTE — Telephone Encounter (Signed)
Encounter for attestation.  

## 2020-11-13 ENCOUNTER — Other Ambulatory Visit: Payer: Medicare Other

## 2020-11-13 DIAGNOSIS — Z20822 Contact with and (suspected) exposure to covid-19: Secondary | ICD-10-CM

## 2020-11-15 ENCOUNTER — Encounter: Payer: Self-pay | Admitting: Adult Health

## 2020-11-15 LAB — SARS-COV-2, NAA 2 DAY TAT

## 2020-11-15 LAB — NOVEL CORONAVIRUS, NAA: SARS-CoV-2, NAA: DETECTED — AB

## 2020-11-19 ENCOUNTER — Other Ambulatory Visit (HOSPITAL_COMMUNITY): Payer: Medicare Other

## 2020-11-22 ENCOUNTER — Ambulatory Visit (HOSPITAL_COMMUNITY)
Admission: RE | Admit: 2020-11-22 | Payer: Medicare Other | Source: Ambulatory Visit | Attending: Cardiology | Admitting: Cardiology

## 2020-12-05 ENCOUNTER — Other Ambulatory Visit: Payer: Self-pay

## 2020-12-05 ENCOUNTER — Encounter: Payer: Self-pay | Admitting: Adult Health

## 2020-12-05 ENCOUNTER — Telehealth (INDEPENDENT_AMBULATORY_CARE_PROVIDER_SITE_OTHER): Payer: Medicare Other | Admitting: Adult Health

## 2020-12-05 VITALS — Wt 187.0 lb

## 2020-12-05 DIAGNOSIS — J340 Abscess, furuncle and carbuncle of nose: Secondary | ICD-10-CM

## 2020-12-05 MED ORDER — DOXYCYCLINE HYCLATE 100 MG PO CAPS: 100.0000 mg | ORAL_CAPSULE | Freq: Two times a day (BID) | ORAL | 0 refills | Status: DC

## 2020-12-05 NOTE — Progress Notes (Signed)
Virtual Visit via Video Note  I connected with Ian [name] on 12/05/20 at  3:30 PM EST by a video enabled telemedicine application and verified that I am speaking with the correct person using two identifiers.  Location patient: home Location provider:work or home office Persons participating in the virtual visit: patient, provider  I discussed the limitations of evaluation and management by telemedicine and the availability of in person appointments. The patient expressed understanding and agreed to proceed.   HPI: 80 year old male who is being evaluated today for an acute issue.  He reports that over the last month he has had tenderness in his left nostril.  He reports having red painful bump on the inside of his left nostril.  Has not had any drainage.  Pain is worse when blowing his nose.  He has been applying Neosporin which has not helped much.   ROS: See pertinent positives and negatives per HPI.  Past Medical History:  Diagnosis Date  . Allergy   . Apnea, sleep 07/28/07   NPSG [place], AHI 79.9  . Carotid artery stenosis    mild 04/2010, 05/2012  . Coronary heart disease 2008   Cath 2008, 60-70% proximal diagonal 1 stenosis,  RCA 40% stenosis.  nonischemic Lexiscan 03/2011  . Diabetes mellitus    type II  . GERD (gastroesophageal reflux disease)   . Hyperlipidemia   . Hypertension   . Hypospadias     Past Surgical History:  Procedure Laterality Date  . NASAL SINUS SURGERY    . NISSEN FUNDOPLICATION    . TONSILLECTOMY    . VEIN LIGATION AND STRIPPING      Family History  Problem Relation Age of Onset  . Coronary artery disease Father 28       CABG  . Dementia Mother        Current Outpatient Medications:  .  aspirin 81 MG tablet, Take 1 tablet (81 mg total) by mouth daily., Disp: 90 tablet, Rfl: 3 .  Cholecalciferol (VITAMIN D3 PO), Take 10,000 Units by mouth once a week., Disp: , Rfl:  .  Coenzyme Q10 (CO Q10) 100 MG CAPS, Take 1 tablet by mouth daily.,  Disp: , Rfl:  .  doxazosin (CARDURA) 2 MG tablet, Take 1 tablet (2 mg total) by mouth at bedtime., Disp: 90 tablet, Rfl: 3 .  doxycycline (VIBRAMYCIN) 100 MG capsule, Take 1 capsule (100 mg total) by mouth 2 (two) times daily., Disp: 14 capsule, Rfl: 0 .  econazole nitrate 1 % cream, Apply topically daily., Disp: 15 g, Rfl: 2 .  fexofenadine (ALLEGRA) 180 MG tablet, Take 1 tablet (180 mg total) by mouth daily., Disp: 90 tablet, Rfl: 3 .  fluticasone (FLONASE) 50 MCG/ACT nasal spray, USE 2 SPRAYS IN EACH NOSTRIL DAILY, Disp: 48 g, Rfl: 3 .  gabapentin (NEURONTIN) 300 MG capsule, TAKE 1 TO 2 CAPSULES IN THE EVENING, Disp: 180 capsule, Rfl: 1 .  GLUCOSAMINE-CHONDROITIN PO, Take by mouth. 1500 mg glucosamine and 1200 mg of Chondroitin TWICE DAILY, Disp: , Rfl:  .  hydrocortisone 2.5 % cream, Apply topically 2 (two) times daily., Disp: 30 g, Rfl: 3 .  Lancets (ONETOUCH ULTRASOFT) lancets, USE TO TEST BLOOD GLUCOSE TWICE DAILY, Disp: 200 each, Rfl: 3 .  LORazepam (ATIVAN) 0.5 MG tablet, TAKE 1 TABLET ONCE AS NEEDED FOR ANXIETY, Disp: 30 tablet, Rfl: 1 .  metFORMIN (GLUCOPHAGE) 500 MG tablet, Take 2 tablets with AM meal and 1 tablet with PM meal, Disp: 270 tablet, Rfl: 1 .  nitroGLYCERIN (NITROSTAT) 0.4 MG SL tablet, Place 1 tablet (0.4 mg total) under the tongue every 5 (five) minutes as needed for chest pain., Disp: 90 tablet, Rfl: 3 .  olmesartan (BENICAR) 20 MG tablet, Take 1 tablet (20 mg total) by mouth daily., Disp: 90 tablet, Rfl: 3 .  Omega-3 Fatty Acids (FISH OIL ULTRA) 1400 MG CAPS, Take by mouth., Disp: , Rfl:  .  terbinafine (LAMISIL) 250 MG tablet, Take 1 tablet (250 mg total) by mouth daily., Disp: 30 tablet, Rfl: 0 .  traMADol (ULTRAM) 50 MG tablet, TAKE 1 TABLET EVERY 8 HOURS AS NEEDED, Disp: 90 tablet, Rfl: 0 .  zolpidem (AMBIEN) 5 MG tablet, TAKE 1 TABLET AT BEDTIME AS NEEDED FOR SLEEP, Disp: 90 tablet, Rfl: 0 .  rosuvastatin (CRESTOR) 40 MG tablet, Take 1 tablet (40 mg total) by mouth  daily., Disp: 90 tablet, Rfl: 3  EXAM:  VITALS per patient if applicable:  GENERAL: alert, oriented, appears well and in no acute distress  HEENT: atraumatic, conjunttiva clear, no obvious abnormalities on inspection of external nose and ears  NECK: normal movements of the head and neck  LUNGS: on inspection no signs of respiratory distress, breathing rate appears normal, no obvious gross SOB, gasping or wheezing  CV: no obvious cyanosis  MS: moves all visible extremities without noticeable abnormality  PSYCH/NEURO: pleasant and cooperative, no obvious depression or anxiety, speech and thought processing grossly intact  ASSESSMENT AND PLAN:  Discussed the following assessment and plan:  1. Nasal abscess -We will treat for small nasal abscess.  Send in doxycycline twice daily x7 days.  He was advised to follow-up if there is no improvement by the end of his antibiotic regimen or sooner if symptoms worsen.  He can also use some normal saline spray to keep his nasal mucosa moist during the winter. - doxycycline (VIBRAMYCIN) 100 MG capsule; Take 1 capsule (100 mg total) by mouth 2 (two) times daily.  Dispense: 14 capsule; Refill: 0     I discussed the assessment and treatment plan with the patient. The patient was provided an opportunity to ask questions and all were answered. The patient agreed with the plan and demonstrated an understanding of the instructions.   The patient was advised to call back or seek an in-person evaluation if the symptoms worsen or if the condition fails to improve as anticipated.   Dorothyann Peng, NP

## 2020-12-13 ENCOUNTER — Telehealth (HOSPITAL_COMMUNITY): Payer: Self-pay | Admitting: *Deleted

## 2020-12-13 NOTE — Telephone Encounter (Signed)
Close encounter 

## 2020-12-17 ENCOUNTER — Telehealth (HOSPITAL_COMMUNITY): Payer: Self-pay | Admitting: Cardiology

## 2020-12-17 ENCOUNTER — Ambulatory Visit (HOSPITAL_COMMUNITY)
Admission: RE | Admit: 2020-12-17 | Payer: Medicare Other | Source: Ambulatory Visit | Attending: Cardiology | Admitting: Cardiology

## 2020-12-17 NOTE — Telephone Encounter (Signed)
Patient cancelled GXT for today, 12/17/20 due to he is having severe nose bleeds and unable to get it to stop.  Patient states he will call at a later date to reschedule. He cancelled original  appointmentt for GXT due to COVID +. Order will be removed from [facility] and when patient calls back we can reinstate the order.

## 2020-12-18 ENCOUNTER — Encounter: Payer: Self-pay | Admitting: Adult Health

## 2020-12-20 DIAGNOSIS — R040 Epistaxis: Secondary | ICD-10-CM | POA: Diagnosis not present

## 2020-12-26 ENCOUNTER — Other Ambulatory Visit: Payer: Self-pay

## 2020-12-26 ENCOUNTER — Encounter: Payer: Self-pay | Admitting: Adult Health

## 2020-12-26 ENCOUNTER — Ambulatory Visit (INDEPENDENT_AMBULATORY_CARE_PROVIDER_SITE_OTHER): Payer: Medicare Other

## 2020-12-26 ENCOUNTER — Ambulatory Visit (INDEPENDENT_AMBULATORY_CARE_PROVIDER_SITE_OTHER): Payer: Medicare Other | Admitting: Adult Health

## 2020-12-26 VITALS — BP 128/60 | Temp 97.6°F | Wt 187.0 lb

## 2020-12-26 DIAGNOSIS — E119 Type 2 diabetes mellitus without complications: Secondary | ICD-10-CM

## 2020-12-26 DIAGNOSIS — E538 Deficiency of other specified B group vitamins: Secondary | ICD-10-CM

## 2020-12-26 DIAGNOSIS — E559 Vitamin D deficiency, unspecified: Secondary | ICD-10-CM

## 2020-12-26 DIAGNOSIS — R5383 Other fatigue: Secondary | ICD-10-CM

## 2020-12-26 DIAGNOSIS — R0602 Shortness of breath: Secondary | ICD-10-CM | POA: Diagnosis not present

## 2020-12-26 LAB — CBC WITH DIFFERENTIAL/PLATELET
Basophils Absolute: 0.1 10*3/uL (ref 0.0–0.1)
Basophils Relative: 0.7 % (ref 0.0–3.0)
Eosinophils Absolute: 0.3 10*3/uL (ref 0.0–0.7)
Eosinophils Relative: 3.8 % (ref 0.0–5.0)
HCT: 39.1 % (ref 39.0–52.0)
Hemoglobin: 13.2 g/dL (ref 13.0–17.0)
Lymphocytes Relative: 14.5 % (ref 12.0–46.0)
Lymphs Abs: 1.2 10*3/uL (ref 0.7–4.0)
MCHC: 33.8 g/dL (ref 30.0–36.0)
MCV: 86.9 fl (ref 78.0–100.0)
Monocytes Absolute: 0.4 10*3/uL (ref 0.1–1.0)
Monocytes Relative: 5.2 % (ref 3.0–12.0)
Neutro Abs: 6.1 10*3/uL (ref 1.4–7.7)
Neutrophils Relative %: 75.8 % (ref 43.0–77.0)
Platelets: 201 10*3/uL (ref 150.0–400.0)
RBC: 4.5 Mil/uL (ref 4.22–5.81)
RDW: 13.4 % (ref 11.5–15.5)
WBC: 8 10*3/uL (ref 4.0–10.5)

## 2020-12-26 LAB — IBC PANEL
Iron: 61 ug/dL (ref 42–165)
Saturation Ratios: 18.8 % — ABNORMAL LOW (ref 20.0–50.0)
Transferrin: 232 mg/dL (ref 212.0–360.0)

## 2020-12-26 LAB — BASIC METABOLIC PANEL
BUN: 23 mg/dL (ref 6–23)
CO2: 27 mEq/L (ref 19–32)
Calcium: 9.4 mg/dL (ref 8.4–10.5)
Chloride: 106 mEq/L (ref 96–112)
Creatinine, Ser: 1.33 mg/dL (ref 0.40–1.50)
GFR: 50.1 mL/min — ABNORMAL LOW (ref >60.00)
Glucose, Bld: 144 mg/dL — ABNORMAL HIGH (ref 70–99)
Potassium: 4.4 mEq/L (ref 3.5–5.1)
Sodium: 139 mEq/L (ref 135–145)

## 2020-12-26 LAB — HEMOGLOBIN A1C: Hgb A1c MFr Bld: 6.7 % — ABNORMAL HIGH (ref 4.6–6.5)

## 2020-12-26 LAB — TSH: TSH: 0.68 u[IU]/mL (ref 0.35–4.50)

## 2020-12-26 LAB — VITAMIN B12: Vitamin B-12: 774 pg/mL (ref 211–911)

## 2020-12-26 LAB — VITAMIN D 25 HYDROXY (VIT D DEFICIENCY, FRACTURES): VITD: 39.63 ng/mL (ref 30.00–100.00)

## 2020-12-26 NOTE — Progress Notes (Signed)
Subjective:    Patient ID: Ian [name], male    DOB: 1941-09-06, 80 y.o.   MRN: [id number]  HPI 80 year old male who  has a past medical history of Allergy, Apnea, sleep (07/28/07), Carotid artery stenosis, Coronary heart disease (2008), Diabetes mellitus, GERD (gastroesophageal reflux disease), Hyperlipidemia, Hypertension, and Hypospadias.  He presents to the office today with the complaint of fatigue. He was diagnosed with Covid 19 in December 2021 and since that time ( roughly two months) he has been chronically fatigued. Reports that even with minimal exertion he gets extremely tired and has some mild shortness of breath. He tries to walk a mile but has to stop more often than in the past. He is sleeping well at night. He does take Vitamin D supplement and B12 supplements at home   Also reports continued dizziness that has been an ongoing issue for multiple years at this point. He is wondering if it could be Cardura causing his symptoms. We have worked him up for this in the past with no clear cut cause.    Review of Systems See HPI   Past Medical History:  Diagnosis Date  . Allergy   . Apnea, sleep 07/28/07   NPSG [place], AHI 79.9  . Carotid artery stenosis    mild 04/2010, 05/2012  . Coronary heart disease 2008   Cath 2008, 60-70% proximal diagonal 1 stenosis,  RCA 40% stenosis.  nonischemic Lexiscan 03/2011  . Diabetes mellitus    type II  . GERD (gastroesophageal reflux disease)   . Hyperlipidemia   . Hypertension   . Hypospadias     Social History   Socioeconomic History  . Marital status: Married    Spouse name: Not on file  . Number of children: 3  . Years of education: Not on file  . Highest education level: Not on file  Occupational History  . Occupation: Retired Games developer: RETIRED  Tobacco Use  . Smoking status: Never Smoker  . Smokeless tobacco: Never Used  Substance and Sexual Activity  . Alcohol use: No    Alcohol/week: 0.0 standard  drinks  . Drug use: No  . Sexual activity: Not on file  Other Topics Concern  . Not on file  Social History Narrative   Married.  Three children from first marriage.  First wife died of ovarian cancer.     Retired, did work for [employer] for 44 years.    Social Determinants of Health   Financial Resource Strain: Low Risk   . Difficulty of Paying Living Expenses: Not hard at all  Food Insecurity: No Food Insecurity  . Worried About Charity fundraiser in the Last Year: Never true  . Ran Out of Food in the Last Year: Never true  Transportation Needs: No Transportation Needs  . Lack of Transportation (Medical): No  . Lack of Transportation (Non-Medical): No  Physical Activity: Sufficiently Active  . Days of Exercise per Week: 5 days  . Minutes of Exercise per Session: 50 min  Stress: No Stress Concern Present  . Feeling of Stress : Not at all  Social Connections: Moderately Integrated  . Frequency of Communication with Friends and Family: More than three times a week  . Frequency of Social Gatherings with Friends and Family: Once a week  . Attends Religious Services: More than 4 times per year  . Active Member of Clubs or Organizations: No  . Attends Archivist Meetings: Never  .  Marital Status: Married  Human resources officer Violence: Not At Risk  . Fear of Current or Ex-Partner: No  . Emotionally Abused: No  . Physically Abused: No  . Sexually Abused: No    Past Surgical History:  Procedure Laterality Date  . NASAL SINUS SURGERY    . NISSEN FUNDOPLICATION    . TONSILLECTOMY    . VEIN LIGATION AND STRIPPING      Family History  Problem Relation Age of Onset  . Coronary artery disease Father 51       CABG  . Dementia Mother     Allergies  Allergen Reactions  . Iohexol      Desc: pt. states severe reaction, dr. told patient never to have the dye again     Current Outpatient Medications on File Prior to Visit  Medication Sig Dispense Refill  .  aspirin 81 MG tablet Take 1 tablet (81 mg total) by mouth daily. 90 tablet 3  . Cholecalciferol (VITAMIN D3 PO) Take 10,000 Units by mouth once a week.    . Coenzyme Q10 (CO Q10) 100 MG CAPS Take 1 tablet by mouth daily.    Marland Kitchen doxazosin (CARDURA) 2 MG tablet Take 1 tablet (2 mg total) by mouth at bedtime. 90 tablet 3  . econazole nitrate 1 % cream Apply topically daily. 15 g 2  . fexofenadine (ALLEGRA) 180 MG tablet Take 1 tablet (180 mg total) by mouth daily. 90 tablet 3  . fluticasone (FLONASE) 50 MCG/ACT nasal spray USE 2 SPRAYS IN EACH NOSTRIL DAILY 48 g 3  . gabapentin (NEURONTIN) 300 MG capsule TAKE 1 TO 2 CAPSULES IN THE EVENING 180 capsule 1  . GLUCOSAMINE-CHONDROITIN PO Take by mouth. 1500 mg glucosamine and 1200 mg of Chondroitin TWICE DAILY    . hydrocortisone 2.5 % cream Apply topically 2 (two) times daily. 30 g 3  . Lancets (ONETOUCH ULTRASOFT) lancets USE TO TEST BLOOD GLUCOSE TWICE DAILY 200 each 3  . LORazepam (ATIVAN) 0.5 MG tablet TAKE 1 TABLET ONCE AS NEEDED FOR ANXIETY 30 tablet 1  . metFORMIN (GLUCOPHAGE) 500 MG tablet Take 2 tablets with AM meal and 1 tablet with PM meal 270 tablet 1  . olmesartan (BENICAR) 20 MG tablet Take 1 tablet (20 mg total) by mouth daily. 90 tablet 3  . Omega-3 Fatty Acids (FISH OIL ULTRA) 1400 MG CAPS Take by mouth.    . traMADol (ULTRAM) 50 MG tablet TAKE 1 TABLET EVERY 8 HOURS AS NEEDED 90 tablet 0  . zolpidem (AMBIEN) 5 MG tablet TAKE 1 TABLET AT BEDTIME AS NEEDED FOR SLEEP 90 tablet 0  . nitroGLYCERIN (NITROSTAT) 0.4 MG SL tablet Place 1 tablet (0.4 mg total) under the tongue every 5 (five) minutes as needed for chest pain. 90 tablet 3  . rosuvastatin (CRESTOR) 40 MG tablet Take 1 tablet (40 mg total) by mouth daily. 90 tablet 3   No current facility-administered medications on file prior to visit.    BP 128/60   Temp 97.6 F (36.4 C)   Wt 187 lb (84.8 kg)   BMI 30.18 kg/m       Objective:   Physical Exam Vitals and nursing note  reviewed.  Constitutional:      Appearance: Normal appearance.  Cardiovascular:     Rate and Rhythm: Normal rate and regular rhythm.     Pulses: Normal pulses.     Heart sounds: Normal heart sounds.  Pulmonary:     Effort: Pulmonary effort is normal.  Breath sounds: Normal breath sounds.  Musculoskeletal:        General: Normal range of motion.  Skin:    General: Skin is warm and dry.     Capillary Refill: Capillary refill takes less than 2 seconds.  Neurological:     General: No focal deficit present.     Mental Status: He is alert and oriented to person, place, and time.  Psychiatric:        Mood and Affect: Mood normal.        Behavior: Behavior normal.        Thought Content: Thought content normal.        Judgment: Judgment normal.        Assessment & Plan:  1. Fatigue, unspecified type - Likely post covid syndrome. Encouraged to slowly increase activity levels on a daily basis. It may take several more months to recover  - Follow up in as needed - TSH; Future - Basic Metabolic Panel; Future - CBC with Differential/Platelet; Future - IBC Panel([facility]); Future - Vitamin D, 25-hydroxy; Future - Vitamin B12; Future - DG Chest 2 View; Future - Hemoglobin A1c; Future - Hemoglobin A1c - Vitamin B12 - Vitamin D, 25-hydroxy - IBC Panel([facility]) - CBC with Differential/Platelet - Basic Metabolic Panel - TSH  2. Diabetes mellitus without complication ([place])  - Basic Metabolic Panel; Future - Hemoglobin A1c; Future - Hemoglobin A1c - CBC with Differential/Platelet - Basic Metabolic Panel - TSH  3. Vitamin D deficiency  - Vitamin D, 25-hydroxy; Future - Vitamin D, 25-hydroxy  4. Vitamin B 12 deficiency  - Vitamin B12; Future - Vitamin B12  Dorothyann Peng, NP

## 2020-12-27 ENCOUNTER — Telehealth: Payer: Self-pay | Admitting: Adult Health

## 2020-12-27 ENCOUNTER — Encounter: Payer: Self-pay | Admitting: Adult Health

## 2020-12-27 NOTE — Telephone Encounter (Signed)
Spoke to patient and informed him of his labs   Will have him start an OTC iron supplement every other day

## 2021-01-06 ENCOUNTER — Other Ambulatory Visit: Payer: Self-pay | Admitting: Adult Health

## 2021-01-17 ENCOUNTER — Telehealth (HOSPITAL_COMMUNITY): Payer: Self-pay | Admitting: *Deleted

## 2021-01-17 ENCOUNTER — Other Ambulatory Visit (HOSPITAL_COMMUNITY)
Admission: RE | Admit: 2021-01-17 | Discharge: 2021-01-17 | Disposition: A | Discharge status: Home / Self Care | Payer: Medicare Other | Source: Ambulatory Visit | Attending: Cardiology | Admitting: Cardiology

## 2021-01-17 DIAGNOSIS — Z20822 Contact with and (suspected) exposure to covid-19: Secondary | ICD-10-CM | POA: Diagnosis not present

## 2021-01-17 DIAGNOSIS — Z01812 Encounter for preprocedural laboratory examination: Secondary | ICD-10-CM | POA: Diagnosis not present

## 2021-01-17 DIAGNOSIS — R040 Epistaxis: Secondary | ICD-10-CM | POA: Diagnosis not present

## 2021-01-17 LAB — SARS CORONAVIRUS 2 (TAT 6-24 HRS): SARS Coronavirus 2: NEGATIVE

## 2021-01-17 NOTE — Telephone Encounter (Signed)
Close encounter 

## 2021-01-21 ENCOUNTER — Ambulatory Visit (HOSPITAL_COMMUNITY)
Admission: RE | Admit: 2021-01-21 | Discharge: 2021-01-21 | Disposition: A | Discharge status: Home / Self Care | Payer: Medicare Other | Source: Ambulatory Visit | Attending: Cardiovascular Disease | Admitting: Cardiovascular Disease

## 2021-01-21 ENCOUNTER — Other Ambulatory Visit: Payer: Self-pay

## 2021-01-21 DIAGNOSIS — R0602 Shortness of breath: Secondary | ICD-10-CM | POA: Diagnosis not present

## 2021-01-21 LAB — EXERCISE TOLERANCE TEST
Estimated workload: 7 METS
Exercise duration (min): 6 min
Exercise duration (sec): 0 s
MPHR: 139 {beats}/min
Peak HR: 114 {beats}/min
Percent HR: 82 %
Rest HR: 56 {beats}/min

## 2021-02-06 ENCOUNTER — Encounter: Payer: Self-pay | Admitting: Adult Health

## 2021-02-20 ENCOUNTER — Other Ambulatory Visit: Payer: Self-pay

## 2021-02-20 ENCOUNTER — Encounter: Payer: Self-pay | Admitting: Adult Health

## 2021-02-20 ENCOUNTER — Ambulatory Visit (INDEPENDENT_AMBULATORY_CARE_PROVIDER_SITE_OTHER): Payer: Medicare Other | Admitting: Adult Health

## 2021-02-20 VITALS — BP 118/50 | HR 61 | Temp 97.7°F | Ht 66.0 in | Wt 182.8 lb

## 2021-02-20 DIAGNOSIS — I10 Essential (primary) hypertension: Secondary | ICD-10-CM | POA: Diagnosis not present

## 2021-02-20 DIAGNOSIS — Z760 Encounter for issue of repeat prescription: Secondary | ICD-10-CM | POA: Diagnosis not present

## 2021-02-20 DIAGNOSIS — N401 Enlarged prostate with lower urinary tract symptoms: Secondary | ICD-10-CM

## 2021-02-20 MED ORDER — TRAMADOL HCL 50 MG PO TABS: 50.0000 mg | ORAL_TABLET | Freq: Three times a day (TID) | ORAL | 0 refills | Status: DC | PRN

## 2021-02-20 MED ORDER — METFORMIN HCL 500 MG PO TABS: ORAL_TABLET | ORAL | 1 refills | Status: DC

## 2021-02-20 MED ORDER — LORAZEPAM 0.5 MG PO TABS: 0.5000 mg | ORAL_TABLET | Freq: Two times a day (BID) | ORAL | 1 refills | Status: DC | PRN

## 2021-02-20 MED ORDER — TAMSULOSIN HCL 0.4 MG PO CAPS: 0.4000 mg | ORAL_CAPSULE | Freq: Every day | ORAL | 1 refills | Status: DC

## 2021-02-20 NOTE — Progress Notes (Signed)
Subjective:    Patient ID: Ian [name], male    DOB: 06-01-1941, 80 y.o.   MRN: [id number]  HPI 80 year old male who  has a past medical history of Allergy, Apnea, sleep (07/28/07), Carotid artery stenosis, Coronary heart disease (2008), Diabetes mellitus, GERD (gastroesophageal reflux disease), Hyperlipidemia, Hypertension, and Hypospadias.  He presents to the office today for follow-up regarding hypertension.  About 6 weeks ago he stopped taking Cardura he because of chronic lightheadedness.  Since stopping Cardura his lightheadedness did improve somewhat.  He is concerned that since he was taking Cardura for blood pressure management as well as for BPH if he needs to have any medication added.  He has been monitoring his blood pressures at home and reports readings consistently in the 130's/80.   He does report that his issues with BPH such as decreased stream, incompletely bladder emptying, dribbling, and nocturia have returned.   He also needs some of his chronic disease medications renewed prior to going to [place] this summer.   BP Readings from Last 3 Encounters:  02/20/21 (!) 118/50  12/26/20 128/60  09/26/20 130/63     Review of Systems See HPI   Past Medical History:  Diagnosis Date  . Allergy   . Apnea, sleep 07/28/07   NPSG [place], AHI 79.9  . Carotid artery stenosis    mild 04/2010, 05/2012  . Coronary heart disease 2008   Cath 2008, 60-70% proximal diagonal 1 stenosis,  RCA 40% stenosis.  nonischemic Lexiscan 03/2011  . Diabetes mellitus    type II  . GERD (gastroesophageal reflux disease)   . Hyperlipidemia   . Hypertension   . Hypospadias     Social History   Socioeconomic History  . Marital status: Married    Spouse name: Not on file  . Number of children: 3  . Years of education: Not on file  . Highest education level: Not on file  Occupational History  . Occupation: Retired Games developer: RETIRED  Tobacco Use  . Smoking  status: Never Smoker  . Smokeless tobacco: Never Used  Substance and Sexual Activity  . Alcohol use: No    Alcohol/week: 0.0 standard drinks  . Drug use: No  . Sexual activity: Not on file  Other Topics Concern  . Not on file  Social History Narrative   Married.  Three children from first marriage.  First wife died of ovarian cancer.     Retired, did work for [employer] for 44 years.    Social Determinants of Health   Financial Resource Strain: Low Risk   . Difficulty of Paying Living Expenses: Not hard at all  Food Insecurity: No Food Insecurity  . Worried About Charity fundraiser in the Last Year: Never true  . Ran Out of Food in the Last Year: Never true  Transportation Needs: No Transportation Needs  . Lack of Transportation (Medical): No  . Lack of Transportation (Non-Medical): No  Physical Activity: Sufficiently Active  . Days of Exercise per Week: 5 days  . Minutes of Exercise per Session: 50 min  Stress: No Stress Concern Present  . Feeling of Stress : Not at all  Social Connections: Moderately Integrated  . Frequency of Communication with Friends and Family: More than three times a week  . Frequency of Social Gatherings with Friends and Family: Once a week  . Attends Religious Services: More than 4 times per year  . Active Member of Clubs or  Organizations: No  . Attends Archivist Meetings: Never  . Marital Status: Married  Human resources officer Violence: Not At Risk  . Fear of Current or Ex-Partner: No  . Emotionally Abused: No  . Physically Abused: No  . Sexually Abused: No    Past Surgical History:  Procedure Laterality Date  . NASAL SINUS SURGERY    . NISSEN FUNDOPLICATION    . TONSILLECTOMY    . VEIN LIGATION AND STRIPPING      Family History  Problem Relation Age of Onset  . Coronary artery disease Father 37       CABG  . Dementia Mother     Allergies  Allergen Reactions  . Iohexol      Desc: pt. states severe reaction, dr. told  patient never to have the dye again     Current Outpatient Medications on File Prior to Visit  Medication Sig Dispense Refill  . aspirin 81 MG tablet Take 1 tablet (81 mg total) by mouth daily. 90 tablet 3  . Cholecalciferol (VITAMIN D3 PO) Take 10,000 Units by mouth once a week.    . Coenzyme Q10 (CO Q10) 100 MG CAPS Take 1 tablet by mouth daily.    Marland Kitchen econazole nitrate 1 % cream Apply topically daily. 15 g 2  . fexofenadine (ALLEGRA) 180 MG tablet Take 1 tablet (180 mg total) by mouth daily. 90 tablet 3  . fluticasone (FLONASE) 50 MCG/ACT nasal spray USE 2 SPRAYS IN EACH NOSTRIL DAILY 48 g 3  . gabapentin (NEURONTIN) 300 MG capsule TAKE 1 TO 2 CAPSULES IN THE EVENING 180 capsule 1  . GLUCOSAMINE-CHONDROITIN PO Take by mouth. 1500 mg glucosamine and 1200 mg of Chondroitin TWICE DAILY    . hydrocortisone 2.5 % cream Apply topically 2 (two) times daily. 30 g 3  . Lancets (ONETOUCH ULTRASOFT) lancets USE TO TEST BLOOD GLUCOSE TWICE DAILY 200 each 3  . LORazepam (ATIVAN) 0.5 MG tablet TAKE 1 TABLET ONCE AS NEEDED FOR ANXIETY 30 tablet 1  . metFORMIN (GLUCOPHAGE) 500 MG tablet Take 2 tablets with AM meal and 1 tablet with PM meal 270 tablet 1  . olmesartan (BENICAR) 20 MG tablet Take 1 tablet (20 mg total) by mouth daily. 90 tablet 3  . Omega-3 Fatty Acids (FISH OIL ULTRA) 1400 MG CAPS Take by mouth.    . traMADol (ULTRAM) 50 MG tablet TAKE 1 TABLET EVERY 8 HOURS AS NEEDED 90 tablet 0  . zolpidem (AMBIEN) 5 MG tablet TAKE 1 TABLET AT BEDTIME AS NEEDED FOR SLEEP 90 tablet 0  . nitroGLYCERIN (NITROSTAT) 0.4 MG SL tablet Place 1 tablet (0.4 mg total) under the tongue every 5 (five) minutes as needed for chest pain. 90 tablet 3  . rosuvastatin (CRESTOR) 40 MG tablet Take 1 tablet (40 mg total) by mouth daily. 90 tablet 3   No current facility-administered medications on file prior to visit.    BP (!) 118/50 (BP Location: Left Arm, Patient Position: Sitting, Cuff Size: Normal)   Pulse 61   Temp  97.7 F (36.5 C) (Oral)   Ht 5\' 6"  (1.676 m)   Wt 182 lb 12.8 oz (82.9 kg)   SpO2 95%   BMI 29.50 kg/m       Objective:   Physical Exam Vitals and nursing note reviewed.  Constitutional:      Appearance: Normal appearance.  Cardiovascular:     Rate and Rhythm: Normal rate and regular rhythm.     Pulses: Normal pulses.  Heart sounds: Normal heart sounds.  Pulmonary:     Effort: Pulmonary effort is normal.     Breath sounds: Normal breath sounds.  Musculoskeletal:        General: Normal range of motion.  Skin:    General: Skin is warm and dry.     Capillary Refill: Capillary refill takes less than 2 seconds.  Neurological:     General: No focal deficit present.     Mental Status: He is alert and oriented to person, place, and time.  Psychiatric:        Mood and Affect: Mood normal.        Behavior: Behavior normal.        Thought Content: Thought content normal.        Judgment: Judgment normal.       Assessment & Plan:  1. Benign prostatic hyperplasia with lower urinary tract symptoms, symptom details unspecified - Will trial him on Flomax 0.4 mg  - Advised follow up in 2 weeks via mychart  - tamsulosin (FLOMAX) 0.4 MG CAPS capsule; Take 1 capsule (0.4 mg total) by mouth daily.  Dispense: 90 capsule; Refill: 1  2. Essential hypertension - Controlled.   3. Medication refill  - metFORMIN (GLUCOPHAGE) 500 MG tablet; Take 2 tablets with AM meal and 1 tablet with PM meal  Dispense: 270 tablet; Refill: 1 - traMADol (ULTRAM) 50 MG tablet; Take 1 tablet (50 mg total) by mouth every 8 (eight) hours as needed.  Dispense: 90 tablet; Refill: 0 - LORazepam (ATIVAN) 0.5 MG tablet; Take 1 tablet (0.5 mg total) by mouth 3 times/day as needed-between meals & bedtime for anxiety.  Dispense: 30 tablet; Refill: 1  Dorothyann Peng, NP

## 2021-03-17 DIAGNOSIS — H40013 Open angle with borderline findings, low risk, bilateral: Secondary | ICD-10-CM | POA: Diagnosis not present

## 2021-03-24 DIAGNOSIS — Z23 Encounter for immunization: Secondary | ICD-10-CM | POA: Diagnosis not present

## 2021-03-26 DIAGNOSIS — N400 Enlarged prostate without lower urinary tract symptoms: Secondary | ICD-10-CM | POA: Diagnosis not present

## 2021-03-26 DIAGNOSIS — N1831 Chronic kidney disease, stage 3a: Secondary | ICD-10-CM | POA: Diagnosis not present

## 2021-03-26 DIAGNOSIS — I129 Hypertensive chronic kidney disease with stage 1 through stage 4 chronic kidney disease, or unspecified chronic kidney disease: Secondary | ICD-10-CM | POA: Diagnosis not present

## 2021-03-26 DIAGNOSIS — E1122 Type 2 diabetes mellitus with diabetic chronic kidney disease: Secondary | ICD-10-CM | POA: Diagnosis not present

## 2021-03-31 ENCOUNTER — Encounter: Payer: Self-pay | Admitting: Adult Health

## 2021-04-15 ENCOUNTER — Other Ambulatory Visit: Payer: Self-pay | Admitting: Adult Health

## 2021-04-15 DIAGNOSIS — Z760 Encounter for issue of repeat prescription: Secondary | ICD-10-CM

## 2021-04-17 ENCOUNTER — Encounter: Payer: Self-pay | Admitting: Adult Health

## 2021-04-17 DIAGNOSIS — Z760 Encounter for issue of repeat prescription: Secondary | ICD-10-CM

## 2021-04-18 MED ORDER — LORAZEPAM 0.5 MG PO TABS: ORAL_TABLET | ORAL | 1 refills | Status: DC

## 2021-04-18 MED ORDER — ZOLPIDEM TARTRATE 5 MG PO TABS
5.0000 mg | ORAL_TABLET | Freq: Every evening | ORAL | 0 refills | Status: DC | PRN
Start: 2021-04-18 — End: 2021-07-22

## 2021-04-18 NOTE — Telephone Encounter (Signed)
Not sure what the issue was. I refilled Rx and faxed to pharmacy. No further actions needed!

## 2021-04-18 NOTE — Addendum Note (Signed)
Addended by: Gwenyth Ober R on: 04/18/2021 08:55 AM   Modules accepted: Orders

## 2021-04-23 DIAGNOSIS — M25511 Pain in right shoulder: Secondary | ICD-10-CM | POA: Diagnosis not present

## 2021-04-23 DIAGNOSIS — E1142 Type 2 diabetes mellitus with diabetic polyneuropathy: Secondary | ICD-10-CM | POA: Diagnosis not present

## 2021-04-29 DIAGNOSIS — M25511 Pain in right shoulder: Secondary | ICD-10-CM | POA: Diagnosis not present

## 2021-04-29 DIAGNOSIS — R531 Weakness: Secondary | ICD-10-CM | POA: Diagnosis not present

## 2021-04-30 DIAGNOSIS — M25511 Pain in right shoulder: Secondary | ICD-10-CM | POA: Diagnosis not present

## 2021-04-30 DIAGNOSIS — R531 Weakness: Secondary | ICD-10-CM | POA: Diagnosis not present

## 2021-05-05 DIAGNOSIS — M25511 Pain in right shoulder: Secondary | ICD-10-CM | POA: Diagnosis not present

## 2021-05-05 DIAGNOSIS — R531 Weakness: Secondary | ICD-10-CM | POA: Diagnosis not present

## 2021-05-09 DIAGNOSIS — R531 Weakness: Secondary | ICD-10-CM | POA: Diagnosis not present

## 2021-05-09 DIAGNOSIS — M25511 Pain in right shoulder: Secondary | ICD-10-CM | POA: Diagnosis not present

## 2021-05-16 DIAGNOSIS — R531 Weakness: Secondary | ICD-10-CM | POA: Diagnosis not present

## 2021-05-16 DIAGNOSIS — M25511 Pain in right shoulder: Secondary | ICD-10-CM | POA: Diagnosis not present

## 2021-05-19 DIAGNOSIS — R531 Weakness: Secondary | ICD-10-CM | POA: Diagnosis not present

## 2021-05-19 DIAGNOSIS — M25511 Pain in right shoulder: Secondary | ICD-10-CM | POA: Diagnosis not present

## 2021-05-26 DIAGNOSIS — R531 Weakness: Secondary | ICD-10-CM | POA: Diagnosis not present

## 2021-05-26 DIAGNOSIS — M25511 Pain in right shoulder: Secondary | ICD-10-CM | POA: Diagnosis not present

## 2021-05-30 DIAGNOSIS — R531 Weakness: Secondary | ICD-10-CM | POA: Diagnosis not present

## 2021-05-30 DIAGNOSIS — M25511 Pain in right shoulder: Secondary | ICD-10-CM | POA: Diagnosis not present

## 2021-07-09 DIAGNOSIS — I2510 Atherosclerotic heart disease of native coronary artery without angina pectoris: Secondary | ICD-10-CM | POA: Diagnosis not present

## 2021-07-09 DIAGNOSIS — I10 Essential (primary) hypertension: Secondary | ICD-10-CM | POA: Diagnosis not present

## 2021-07-09 DIAGNOSIS — I6523 Occlusion and stenosis of bilateral carotid arteries: Secondary | ICD-10-CM | POA: Diagnosis not present

## 2021-07-09 DIAGNOSIS — E782 Mixed hyperlipidemia: Secondary | ICD-10-CM | POA: Diagnosis not present

## 2021-07-19 ENCOUNTER — Other Ambulatory Visit: Payer: Self-pay | Admitting: Adult Health

## 2021-07-19 ENCOUNTER — Encounter: Payer: Self-pay | Admitting: Adult Health

## 2021-07-19 DIAGNOSIS — N401 Enlarged prostate with lower urinary tract symptoms: Secondary | ICD-10-CM

## 2021-07-21 NOTE — Telephone Encounter (Signed)
Okay for refill?    LOV 02/20/2021  Last Refill  04/18/2021   90  QTY.   0 Refills

## 2021-07-28 MED ORDER — TAMSULOSIN HCL 0.4 MG PO CAPS: 0.4000 mg | ORAL_CAPSULE | Freq: Every day | ORAL | 1 refills | Status: DC

## 2021-08-04 ENCOUNTER — Encounter: Payer: Self-pay | Admitting: Adult Health

## 2021-08-04 NOTE — Telephone Encounter (Signed)
Immunizations have been updated. Please advise.

## 2021-08-27 ENCOUNTER — Other Ambulatory Visit: Payer: Self-pay | Admitting: Adult Health

## 2021-08-27 DIAGNOSIS — Z760 Encounter for issue of repeat prescription: Secondary | ICD-10-CM

## 2021-08-29 ENCOUNTER — Other Ambulatory Visit: Payer: Self-pay | Admitting: Adult Health

## 2021-08-29 DIAGNOSIS — Z760 Encounter for issue of repeat prescription: Secondary | ICD-10-CM

## 2021-08-29 MED ORDER — TRAMADOL HCL 50 MG PO TABS: 50.0000 mg | ORAL_TABLET | Freq: Three times a day (TID) | ORAL | 0 refills | Status: DC | PRN

## 2021-08-29 NOTE — Telephone Encounter (Signed)
Okay for refill?    LOV 02/2021   traMADol (ULTRAM) 50 MG tablet 90 tablet 0 02/20/2021   Sig:   Take 1 tablet (50 mg total) by mouth every 8 (eight) hours as needed.

## 2021-09-11 ENCOUNTER — Other Ambulatory Visit: Payer: Self-pay | Admitting: Adult Health

## 2021-09-11 DIAGNOSIS — Z760 Encounter for issue of repeat prescription: Secondary | ICD-10-CM

## 2021-09-15 ENCOUNTER — Other Ambulatory Visit: Payer: Self-pay

## 2021-09-16 ENCOUNTER — Ambulatory Visit (INDEPENDENT_AMBULATORY_CARE_PROVIDER_SITE_OTHER): Payer: Medicare Other | Admitting: Adult Health

## 2021-09-16 ENCOUNTER — Other Ambulatory Visit: Payer: Self-pay | Admitting: Adult Health

## 2021-09-16 ENCOUNTER — Ambulatory Visit (INDEPENDENT_AMBULATORY_CARE_PROVIDER_SITE_OTHER): Payer: Medicare Other

## 2021-09-16 ENCOUNTER — Encounter: Payer: Self-pay | Admitting: Adult Health

## 2021-09-16 ENCOUNTER — Ambulatory Visit: Payer: Medicare Other

## 2021-09-16 VITALS — BP 120/72 | HR 50 | Temp 97.7°F | Ht 66.0 in | Wt 177.0 lb

## 2021-09-16 DIAGNOSIS — E119 Type 2 diabetes mellitus without complications: Secondary | ICD-10-CM

## 2021-09-16 DIAGNOSIS — M25562 Pain in left knee: Secondary | ICD-10-CM | POA: Diagnosis not present

## 2021-09-16 DIAGNOSIS — F5104 Psychophysiologic insomnia: Secondary | ICD-10-CM

## 2021-09-16 DIAGNOSIS — I10 Essential (primary) hypertension: Secondary | ICD-10-CM | POA: Diagnosis not present

## 2021-09-16 DIAGNOSIS — G8929 Other chronic pain: Secondary | ICD-10-CM

## 2021-09-16 DIAGNOSIS — E782 Mixed hyperlipidemia: Secondary | ICD-10-CM | POA: Diagnosis not present

## 2021-09-16 DIAGNOSIS — M25511 Pain in right shoulder: Secondary | ICD-10-CM

## 2021-09-16 DIAGNOSIS — M25561 Pain in right knee: Secondary | ICD-10-CM | POA: Diagnosis not present

## 2021-09-16 DIAGNOSIS — N401 Enlarged prostate with lower urinary tract symptoms: Secondary | ICD-10-CM | POA: Diagnosis not present

## 2021-09-16 NOTE — Progress Notes (Signed)
Subjective:    Patient ID: Ian [name], male    DOB: [birth year], 80 y.o.   MRN: [id number]  HPI 80 year old male who  has a past medical history of Allergy, Apnea, sleep (07/28/07), Carotid artery stenosis, Coronary heart disease (2008), Diabetes mellitus, GERD (gastroesophageal reflux disease), Hyperlipidemia, Hypertension, and Hypospadias.  He presents to the office today for follow-up regarding multiple health issues.  He spent his summer in [place].  While there he was seen by cardiology with no new medication changes.  He would like to have some labs done today due to history of diabetes which he takes metformin 1000 mg in the morning and 500 mg in the evening. Lab Results  Component Value Date   HGBA1C 6.7 (H) 12/26/2020   He also takes Crestor 40 mg daily for hyperlipidemia Lab Results  Component Value Date   CHOL 127 08/30/2020   HDL 45 08/30/2020   LDLCALC 64 08/30/2020   TRIG 99 08/30/2020   CHOLHDL 2.8 08/30/2020   His blood pressure is controlled Benicar 20 mg daily  BP Readings from Last 3 Encounters:  09/16/21 120/72  02/20/21 (!) 118/50  12/26/20 128/60   Chronic insomnia he takes Ambien 5 mg nightly  His BPH is controlled with Flomax 0.4 mg. Feels well controlled on this medication.   Also has a history of chronic back pain when she takes tramadol 50 mg  8 hours as needed. He reports that over the summer he started to experience more joint pain especially in his right shoulder and bilateral knees. He does not take Tramadol often.    Review of Systems  Constitutional: Negative.   HENT: Negative.    Eyes: Negative.   Respiratory: Negative.    Cardiovascular: Negative.   Gastrointestinal: Negative.   Endocrine: Negative.   Genitourinary: Negative.   Musculoskeletal:  Positive for arthralgias and back pain.  Skin: Negative.   Allergic/Immunologic: Negative.   Neurological: Negative.   Hematological: Negative.   Psychiatric/Behavioral:  Positive for  sleep disturbance.   All other systems reviewed and are negative.  Past Medical History:  Diagnosis Date   Allergy    Apnea, sleep 07/28/07   NPSG [place], AHI 79.9   Carotid artery stenosis    mild 04/2010, 05/2012   Coronary heart disease 2008   Cath 2008, 60-70% proximal diagonal 1 stenosis,  RCA 40% stenosis.  nonischemic Lexiscan 03/2011   Diabetes mellitus    type II   GERD (gastroesophageal reflux disease)    Hyperlipidemia    Hypertension    Hypospadias     Social History   Socioeconomic History   Marital status: Married    Spouse name: Not on file   Number of children: 3   Years of education: Not on file   Highest education level: Not on file  Occupational History   Occupation: Retired Games developer: RETIRED  Tobacco Use   Smoking status: Never   Smokeless tobacco: Never  Substance and Sexual Activity   Alcohol use: No    Alcohol/week: 0.0 standard drinks   Drug use: No   Sexual activity: Not on file  Other Topics Concern   Not on file  Social History Narrative   Married.  Three children from first marriage.  First wife died of ovarian cancer.     Retired, did work for [employer] for 44 years.    Social Determinants of Health   Financial Resource Strain: Low Risk  Difficulty of Paying Living Expenses: Not hard at all  Food Insecurity: No Food Insecurity   Worried About [place] in the Last Year: Never true   Ran Out of Food in the Last Year: Never true  Transportation Needs: No Transportation Needs   Lack of Transportation (Medical): No   Lack of Transportation (Non-Medical): No  Physical Activity: Sufficiently Active   Days of Exercise per Week: 5 days   Minutes of Exercise per Session: 50 min  Stress: No Stress Concern Present   Feeling of Stress : Not at all  Social Connections: Moderately Integrated   Frequency of Communication with Friends and Family: More than three times a week   Frequency of Social Gatherings  with Friends and Family: Once a week   Attends Religious Services: More than 4 times per year   Active Member of [employer] or Organizations: No   Attends Music therapist: Never   Marital Status: Married  Human resources officer Violence: Not At Risk   Fear of Current or Ex-Partner: No   Emotionally Abused: No   Physically Abused: No   Sexually Abused: No    Past Surgical History:  Procedure Laterality Date   NASAL SINUS SURGERY     NISSEN FUNDOPLICATION     TONSILLECTOMY     VEIN LIGATION AND STRIPPING      Family History  Problem Relation Age of Onset   Coronary artery disease Father 96       CABG   Dementia Mother     Allergies  Allergen Reactions   Iohexol      Desc: pt. states severe reaction, dr. told patient never to have the dye again     Current Outpatient Medications on File Prior to Visit  Medication Sig Dispense Refill   aspirin 81 MG tablet Take 1 tablet (81 mg total) by mouth daily. 90 tablet 3   Cholecalciferol (VITAMIN D3 PO) Take 10,000 Units by mouth once a week.     clindamycin (CLEOCIN T) 1 % external solution Apply topically.     Coenzyme Q10 (CO Q10) 100 MG CAPS Take 1 tablet by mouth daily.     econazole nitrate 1 % cream Apply topically daily. 15 g 2   fexofenadine (ALLEGRA) 180 MG tablet Take 1 tablet (180 mg total) by mouth daily. 90 tablet 3   fluticasone (FLONASE) 50 MCG/ACT nasal spray USE 2 SPRAYS IN EACH NOSTRIL DAILY 48 g 3   gabapentin (NEURONTIN) 300 MG capsule TAKE 1 TO 2 CAPSULES IN THE EVENING 180 capsule 3   GLUCOSAMINE-CHONDROITIN PO Take by mouth. 1500 mg glucosamine and 1200 mg of Chondroitin TWICE DAILY     hydrocortisone 2.5 % cream Apply topically 2 (two) times daily. 30 g 3   Lancets (ONETOUCH ULTRASOFT) lancets USE TO TEST BLOOD GLUCOSE TWICE DAILY 200 each 3   metFORMIN (GLUCOPHAGE) 500 MG tablet Take 2 tablets with AM meal and 1 tablet with PM meal 270 tablet 1   olmesartan (BENICAR) 20 MG tablet Take 1 tablet (20 mg  total) by mouth daily. 90 tablet 3   Omega-3 Fatty Acids (FISH OIL ULTRA) 1400 MG CAPS Take by mouth.     tamsulosin (FLOMAX) 0.4 MG CAPS capsule Take 1 capsule (0.4 mg total) by mouth daily. 90 capsule 1   traMADol (ULTRAM) 50 MG tablet Take 1 tablet (50 mg total) by mouth every 8 (eight) hours as needed. 90 tablet 0   zolpidem (AMBIEN) 5 MG tablet TAKE  1 TABLET AT BEDTIME AS NEEDED FOR SLEEP 90 tablet 0   nitroGLYCERIN (NITROSTAT) 0.4 MG SL tablet Place 1 tablet (0.4 mg total) under the tongue every 5 (five) minutes as needed for chest pain. 90 tablet 3   rosuvastatin (CRESTOR) 40 MG tablet Take 1 tablet (40 mg total) by mouth daily. 90 tablet 3   No current facility-administered medications on file prior to visit.    BP 120/72   Pulse (!) 50   Temp 97.7 F (36.5 C) (Oral)   Ht 5\' 6"  (1.676 m)   Wt 177 lb (80.3 kg)   SpO2 96%   BMI 28.57 kg/m        Objective:   Physical Exam Vitals and nursing note reviewed.  Constitutional:      Appearance: Normal appearance.  Cardiovascular:     Rate and Rhythm: Normal rate and regular rhythm.     Pulses: Normal pulses.     Heart sounds: Normal heart sounds.  Pulmonary:     Effort: Pulmonary effort is normal.     Breath sounds: Normal breath sounds.  Musculoskeletal:        General: Normal range of motion.     Right shoulder: Bony tenderness present.     Right knee: Bony tenderness present.     Left knee: Bony tenderness present.  Skin:    General: Skin is warm and dry.  Neurological:     General: No focal deficit present.     Mental Status: He is alert and oriented to person, place, and time.  Psychiatric:        Mood and Affect: Mood normal.        Behavior: Behavior normal.        Thought Content: Thought content normal.        Judgment: Judgment normal.      Assessment & Plan:  1. Diabetes mellitus without complication ([place]) - Consider increase in statin  - CBC with Differential/Platelet; Future - Comprehensive  metabolic panel; Future - Hemoglobin A1c; Future - Lipid panel; Future - TSH; Future - TSH - Lipid panel - Hemoglobin A1c - Comprehensive metabolic panel - CBC with Differential/Platelet  2. Mixed hyperlipidemia  - CBC with Differential/Platelet; Future - Comprehensive metabolic panel; Future - Hemoglobin A1c; Future - Lipid panel; Future - TSH; Future - TSH - Lipid panel - Hemoglobin A1c - Comprehensive metabolic panel - CBC with Differential/Platelet  3. Chronic right shoulder pain - Consider steroid injection  - DG Shoulder Right; Future  4. Chronic pain of both knees - Consider steroid injections  - DG Knee 1-2 Views Right; Future - DG Knee 1-2 Views Left; Future  5. Benign prostatic hyperplasia with lower urinary tract symptoms, symptom details unspecified - Continue flomax   6. Essential hypertension - Well controlled. No change in medications  - CBC with Differential/Platelet; Future - Comprehensive metabolic panel; Future - Hemoglobin A1c; Future - Lipid panel; Future - TSH; Future - TSH - Lipid panel  7. Chronic insomnia - Continue with ambien 5 mg PRN   Dorothyann Peng, NP

## 2021-09-17 ENCOUNTER — Other Ambulatory Visit: Payer: Self-pay

## 2021-09-17 ENCOUNTER — Other Ambulatory Visit (INDEPENDENT_AMBULATORY_CARE_PROVIDER_SITE_OTHER): Payer: Medicare Other

## 2021-09-17 DIAGNOSIS — E782 Mixed hyperlipidemia: Secondary | ICD-10-CM | POA: Diagnosis not present

## 2021-09-17 DIAGNOSIS — I10 Essential (primary) hypertension: Secondary | ICD-10-CM

## 2021-09-17 DIAGNOSIS — E119 Type 2 diabetes mellitus without complications: Secondary | ICD-10-CM

## 2021-09-17 LAB — CBC WITH DIFFERENTIAL/PLATELET
Basophils Absolute: 0.1 10*3/uL (ref 0.0–0.1)
Basophils Relative: 1.1 % (ref 0.0–3.0)
Eosinophils Absolute: 0.3 10*3/uL (ref 0.0–0.7)
Eosinophils Relative: 4.4 % (ref 0.0–5.0)
HCT: 41.1 % (ref 39.0–52.0)
Hemoglobin: 13.7 g/dL (ref 13.0–17.0)
Lymphocytes Relative: 19 % (ref 12.0–46.0)
Lymphs Abs: 1.2 10*3/uL (ref 0.7–4.0)
MCHC: 33.4 g/dL (ref 30.0–36.0)
MCV: 87.2 fl (ref 78.0–100.0)
Monocytes Absolute: 0.4 10*3/uL (ref 0.1–1.0)
Monocytes Relative: 6.5 % (ref 3.0–12.0)
Neutro Abs: 4.5 10*3/uL (ref 1.4–7.7)
Neutrophils Relative %: 69 % (ref 43.0–77.0)
Platelets: 195 10*3/uL (ref 150.0–400.0)
RBC: 4.71 Mil/uL (ref 4.22–5.81)
RDW: 13.1 % (ref 11.5–15.5)
WBC: 6.5 10*3/uL (ref 4.0–10.5)

## 2021-09-17 LAB — COMPREHENSIVE METABOLIC PANEL
ALT: 20 U/L (ref 0–53)
AST: 23 U/L (ref 0–37)
Albumin: 3.9 g/dL (ref 3.5–5.2)
Alkaline Phosphatase: 38 U/L — ABNORMAL LOW (ref 39–117)
BUN: 26 mg/dL — ABNORMAL HIGH (ref 6–23)
CO2: 26 mEq/L (ref 19–32)
Calcium: 9.2 mg/dL (ref 8.4–10.5)
Chloride: 103 mEq/L (ref 96–112)
Creatinine, Ser: 1.43 mg/dL (ref 0.40–1.50)
GFR: 45.7 mL/min — ABNORMAL LOW (ref >60.00)
Glucose, Bld: 120 mg/dL — ABNORMAL HIGH (ref 70–99)
Potassium: 4.1 mEq/L (ref 3.5–5.1)
Sodium: 134 mEq/L — ABNORMAL LOW (ref 135–145)
Total Bilirubin: 1 mg/dL (ref 0.2–1.2)
Total Protein: 7 g/dL (ref 6.0–8.3)

## 2021-09-17 LAB — LIPID PANEL
Cholesterol: 132 mg/dL (ref 0–200)
HDL: 41.9 mg/dL (ref >39.00)
LDL Cholesterol: 64 mg/dL (ref 0–99)
NonHDL: 90.47
Total CHOL/HDL Ratio: 3
Triglycerides: 133 mg/dL (ref 0.0–149.0)
VLDL: 26.6 mg/dL (ref 0.0–40.0)

## 2021-09-17 LAB — TSH: TSH: 0.68 u[IU]/mL (ref 0.35–5.50)

## 2021-09-17 LAB — HEMOGLOBIN A1C: Hgb A1c MFr Bld: 7 % — ABNORMAL HIGH (ref 4.6–6.5)

## 2021-09-17 NOTE — Addendum Note (Signed)
Addended by: Gwenyth Ober R on: 09/17/2021 12:54 PM   Modules accepted: Orders

## 2021-09-18 ENCOUNTER — Ambulatory Visit: Payer: Medicare Other | Admitting: Adult Health

## 2021-09-18 ENCOUNTER — Telehealth: Payer: Self-pay | Admitting: Adult Health

## 2021-09-18 DIAGNOSIS — Z760 Encounter for issue of repeat prescription: Secondary | ICD-10-CM

## 2021-09-18 MED ORDER — METFORMIN HCL 500 MG PO TABS: ORAL_TABLET | ORAL | 1 refills | Status: DC

## 2021-09-18 MED ORDER — OLMESARTAN MEDOXOMIL 20 MG PO TABS: 20.0000 mg | ORAL_TABLET | Freq: Every day | ORAL | 3 refills | Status: DC

## 2021-09-18 NOTE — Telephone Encounter (Signed)
Updated patient on x-rays and lab results.  We will send in medications that are due

## 2021-09-23 ENCOUNTER — Ambulatory Visit (INDEPENDENT_AMBULATORY_CARE_PROVIDER_SITE_OTHER): Payer: Medicare Other | Admitting: Adult Health

## 2021-09-23 ENCOUNTER — Encounter: Payer: Self-pay | Admitting: Adult Health

## 2021-09-23 VITALS — BP 110/58 | HR 66 | Temp 97.5°F | Ht 66.0 in | Wt 177.0 lb

## 2021-09-23 DIAGNOSIS — G8929 Other chronic pain: Secondary | ICD-10-CM

## 2021-09-23 DIAGNOSIS — M25562 Pain in left knee: Secondary | ICD-10-CM

## 2021-09-23 DIAGNOSIS — M25511 Pain in right shoulder: Secondary | ICD-10-CM | POA: Diagnosis not present

## 2021-09-23 MED ORDER — METHYLPREDNISOLONE ACETATE 80 MG/ML IJ SUSP
80.0000 mg | Freq: Once | INTRAMUSCULAR | Status: AC
  Administered 2021-09-23: 80 mg via INTRA_ARTICULAR

## 2021-09-23 NOTE — Progress Notes (Signed)
Subjective:    Patient ID: Ian [name], male    DOB: [birth year], 80 y.o.   MRN: [id number]  HPI 80 year old male who  has a past medical history of Allergy, Apnea, sleep (07/28/07), Carotid artery stenosis, Coronary heart disease (2008), Diabetes mellitus, GERD (gastroesophageal reflux disease), Hyperlipidemia, Hypertension, and Hypospadias.  He presents to the office today for chronic pain in his right shoulder and bilateral knees. He would like to have a steroid injection done   XR of left knee showed IMPRESSION: 1. Mild subchondral cystic change in the central patella, likely degenerative. Elongated inferior patellar pole can be seen with chronic patellar tendon pathology. 2. Small quadriceps tendon enthesophyte.  XR of right shoulder showed  IMPRESSION: 1. Mild acromioclavicular degenerative change. 2. Mild subcortical cystic change in the lateral humeral head, nonspecific, but can be seen with rotator cuff pathology.   Review of Systems See HPI   Past Medical History:  Diagnosis Date   Allergy    Apnea, sleep 07/28/07   NPSG [place], AHI 79.9   Carotid artery stenosis    mild 04/2010, 05/2012   Coronary heart disease 2008   Cath 2008, 60-70% proximal diagonal 1 stenosis,  RCA 40% stenosis.  nonischemic Lexiscan 03/2011   Diabetes mellitus    type II   GERD (gastroesophageal reflux disease)    Hyperlipidemia    Hypertension    Hypospadias     Social History   Socioeconomic History   Marital status: Married    Spouse name: Not on file   Number of children: 3   Years of education: Not on file   Highest education level: Not on file  Occupational History   Occupation: Retired Games developer: RETIRED  Tobacco Use   Smoking status: Never   Smokeless tobacco: Never  Substance and Sexual Activity   Alcohol use: No    Alcohol/week: 0.0 standard drinks   Drug use: No   Sexual activity: Not on file  Other Topics Concern   Not on file  Social  History Narrative   Married.  Three children from first marriage.  First wife died of ovarian cancer.     Retired, did work for [employer] for 44 years.    Social Determinants of Health   Financial Resource Strain: Low Risk    Difficulty of Paying Living Expenses: Not hard at all  Food Insecurity: No Food Insecurity   Worried About Charity fundraiser in the Last Year: Never true   [place] in the Last Year: Never true  Transportation Needs: No Transportation Needs   Lack of Transportation (Medical): No   Lack of Transportation (Non-Medical): No  Physical Activity: Sufficiently Active   Days of Exercise per Week: 5 days   Minutes of Exercise per Session: 50 min  Stress: No Stress Concern Present   Feeling of Stress : Not at all  Social Connections: Moderately Integrated   Frequency of Communication with Friends and Family: More than three times a week   Frequency of Social Gatherings with Friends and Family: Once a week   Attends Religious Services: More than 4 times per year   Active Member of [employer] or Organizations: No   Attends Archivist Meetings: Never   Marital Status: Married  Human resources officer Violence: Not At Risk   Fear of Current or Ex-Partner: No   Emotionally Abused: No   Physically Abused: No   Sexually Abused: No  Past Surgical History:  Procedure Laterality Date   NASAL SINUS SURGERY     NISSEN FUNDOPLICATION     TONSILLECTOMY     VEIN LIGATION AND STRIPPING      Family History  Problem Relation Age of Onset   Coronary artery disease Father 88       CABG   Dementia Mother     Allergies  Allergen Reactions   Iohexol      Desc: pt. states severe reaction, dr. told patient never to have the dye again     Current Outpatient Medications on File Prior to Visit  Medication Sig Dispense Refill   aspirin 81 MG tablet Take 1 tablet (81 mg total) by mouth daily. 90 tablet 3   Cholecalciferol (VITAMIN D3 PO) Take 10,000 Units by  mouth once a week.     clindamycin (CLEOCIN T) 1 % external solution Apply topically.     Coenzyme Q10 (CO Q10) 100 MG CAPS Take 1 tablet by mouth daily.     econazole nitrate 1 % cream Apply topically daily. 15 g 2   fexofenadine (ALLEGRA) 180 MG tablet Take 1 tablet (180 mg total) by mouth daily. 90 tablet 3   fluticasone (FLONASE) 50 MCG/ACT nasal spray USE 2 SPRAYS IN EACH NOSTRIL DAILY 48 g 3   gabapentin (NEURONTIN) 300 MG capsule TAKE 1 TO 2 CAPSULES IN THE EVENING 180 capsule 3   GLUCOSAMINE-CHONDROITIN PO Take by mouth. 1500 mg glucosamine and 1200 mg of Chondroitin TWICE DAILY     hydrocortisone 2.5 % cream Apply topically 2 (two) times daily. 30 g 3   Lancets (ONETOUCH ULTRASOFT) lancets USE TO TEST BLOOD GLUCOSE TWICE DAILY 200 each 3   metFORMIN (GLUCOPHAGE) 500 MG tablet Take 2 tablets with AM meal and 1 tablet with PM meal 270 tablet 1   olmesartan (BENICAR) 20 MG tablet Take 1 tablet (20 mg total) by mouth daily. 90 tablet 3   Omega-3 Fatty Acids (FISH OIL ULTRA) 1400 MG CAPS Take by mouth.     tamsulosin (FLOMAX) 0.4 MG CAPS capsule Take 1 capsule (0.4 mg total) by mouth daily. 90 capsule 1   traMADol (ULTRAM) 50 MG tablet Take 1 tablet (50 mg total) by mouth every 8 (eight) hours as needed. 90 tablet 0   zolpidem (AMBIEN) 5 MG tablet TAKE 1 TABLET AT BEDTIME AS NEEDED FOR SLEEP 90 tablet 0   nitroGLYCERIN (NITROSTAT) 0.4 MG SL tablet Place 1 tablet (0.4 mg total) under the tongue every 5 (five) minutes as needed for chest pain. 90 tablet 3   rosuvastatin (CRESTOR) 40 MG tablet Take 1 tablet (40 mg total) by mouth daily. 90 tablet 3   No current facility-administered medications on file prior to visit.    BP (!) 110/58   Pulse 66   Temp (!) 97.5 F (36.4 C) (Oral)   Ht 5\' 6"  (1.676 m)   Wt 177 lb (80.3 kg)   SpO2 99%   BMI 28.57 kg/m       Objective:   Physical Exam Vitals and nursing note reviewed.  Constitutional:      Appearance: Normal appearance.   Musculoskeletal:     Right shoulder: Bony tenderness present. No crepitus. Decreased range of motion. Normal strength.     Left knee: Bony tenderness present. No swelling, deformity or crepitus. Normal alignment and normal meniscus.  Skin:    General: Skin is warm and dry.     Capillary Refill: Capillary refill takes less than  2 seconds.  Neurological:     General: No focal deficit present.     Mental Status: He is alert and oriented to person, place, and time.  Psychiatric:        Mood and Affect: Mood normal.        Behavior: Behavior normal.        Thought Content: Thought content normal.        Judgment: Judgment normal.      Assessment & Plan:  Will inject right shoulder and left knee today. If he has relief than an come back in 2-3 weeks for right knee injection   1. Chronic right shoulder pain Shoulder injection Verbal consent obtained and verified. Sterile betadine prep. Furthur cleansed with alcohol. Topical analgesic spray: Ethyl chloride. Joint: right  subacromial injection Approached in typical fashion with: posterior approach Completed without difficulty Meds: 3 cc lidocaine 2% no epi, 1 cc depomedrol 80mg /cc Needle:1.5 inch 25 gauge Aftercare instructions and Red flags advised. Immediate improvement in pain noted  - methylPREDNISolone acetate (DEPO-MEDROL) injection 80 mg   2. Chronic pain of left knee Discussed risks and benefits of corticosteroid injection and patient consented.  After prepping skin with betadine, injected 80 mg depomedrol and 2 cc of plain xylocaine with 22 gauge one and one half inch needle using anterolateral approach and pt tolerated well.  - methylPREDNISolone acetate (DEPO-MEDROL) injection 80 mg  Dorothyann Peng, NP

## 2021-09-26 ENCOUNTER — Encounter: Payer: Self-pay | Admitting: Adult Health

## 2021-09-26 NOTE — Telephone Encounter (Signed)
This has been taking care of pt has been schedule with Dr.Burchtte for DM footwear. No further actions needed. PPW taking to Burchette's CMA.

## 2021-10-06 ENCOUNTER — Encounter: Payer: Self-pay | Admitting: Adult Health

## 2021-10-07 ENCOUNTER — Ambulatory Visit (INDEPENDENT_AMBULATORY_CARE_PROVIDER_SITE_OTHER): Payer: Medicare Other | Admitting: Family Medicine

## 2021-10-07 VITALS — BP 110/62 | HR 72 | Temp 97.4°F | Wt 175.6 lb

## 2021-10-07 DIAGNOSIS — E118 Type 2 diabetes mellitus with unspecified complications: Secondary | ICD-10-CM | POA: Diagnosis not present

## 2021-10-07 DIAGNOSIS — E1142 Type 2 diabetes mellitus with diabetic polyneuropathy: Secondary | ICD-10-CM

## 2021-10-07 NOTE — Progress Notes (Signed)
Established Patient Office Visit  Subjective:  Patient ID: Ian [name], male    DOB: Apr 11, 1941  Age: 80 y.o. MRN: [id number]  CC:  Chief Complaint  Patient presents with   Diabetes    DM foot wear.    HPI Ian [name] presents for diabetes follow-up.  He is followed regularly for primary care by our nurse practitioner.  He has history of type 2 diabetes which was diagnosed about 20 years ago.  He does have history of some peripheral neuropathy and prior history of calluses on the feet.  He has history of hammertoes.  He is needing prescription for diabetic foot wear with inserts.  He is followed closely for his diabetes and this has been fairly well controlled with most recent A1c 7.0% on 09-17-2021.  Prior to most recent A1c he has been consistently in the 6 range for years.  He takes metformin twice daily.  He has been very compliant with therapy for diabetes.  Has lost substantial amount of weight due to his efforts over recent years.  He examines his feet regularly for callus or ulcer.  Patient has been very compliant with continuing close follow-up under a comprehensive diabetes management program.  We have reviewed notes pertaining to his diabetic care by his primary provider, Dorothyann Peng NP and agree with current care.    Past Medical History:  Diagnosis Date   Allergy    Apnea, sleep 07/28/07   NPSG [place], AHI 79.9   Carotid artery stenosis    mild 04/2010, 05/2012   Coronary heart disease 2008   Cath 2008, 60-70% proximal diagonal 1 stenosis,  RCA 40% stenosis.  nonischemic Lexiscan 03/2011   Diabetes mellitus    type II   GERD (gastroesophageal reflux disease)    Hyperlipidemia    Hypertension    Hypospadias     Past Surgical History:  Procedure Laterality Date   NASAL SINUS SURGERY     NISSEN FUNDOPLICATION     TONSILLECTOMY     VEIN LIGATION AND STRIPPING      Family History  Problem Relation Age of Onset   Coronary artery disease Father 85        CABG   Dementia Mother     Social History   Socioeconomic History   Marital status: Married    Spouse name: Not on file   Number of children: 3   Years of education: Not on file   Highest education level: Not on file  Occupational History   Occupation: Retired Games developer: RETIRED  Tobacco Use   Smoking status: Never   Smokeless tobacco: Never  Substance and Sexual Activity   Alcohol use: No    Alcohol/week: 0.0 standard drinks   Drug use: No   Sexual activity: Not on file  Other Topics Concern   Not on file  Social History Narrative   Married.  Three children from first marriage.  First wife died of ovarian cancer.     Retired, did work for [employer] for 44 years.    Social Determinants of Health   Financial Resource Strain: Low Risk    Difficulty of Paying Living Expenses: Not hard at all  Food Insecurity: No Food Insecurity   Worried About Charity fundraiser in the Last Year: Never true   [place] in the Last Year: Never true  Transportation Needs: No Transportation Needs   Lack of Transportation (Medical): No   Lack  of Transportation (Non-Medical): No  Physical Activity: Sufficiently Active   Days of Exercise per Week: 5 days   Minutes of Exercise per Session: 50 min  Stress: No Stress Concern Present   Feeling of Stress : Not at all  Social Connections: Moderately Integrated   Frequency of Communication with Friends and Family: More than three times a week   Frequency of Social Gatherings with Friends and Family: Once a week   Attends Religious Services: More than 4 times per year   Active Member of [employer] or Organizations: No   Attends Archivist Meetings: Never   Marital Status: Married  Human resources officer Violence: Not At Risk   Fear of Current or Ex-Partner: No   Emotionally Abused: No   Physically Abused: No   Sexually Abused: No    Outpatient Medications Prior to Visit  Medication Sig Dispense Refill   aspirin  81 MG tablet Take 1 tablet (81 mg total) by mouth daily. 90 tablet 3   Cholecalciferol (VITAMIN D3 PO) Take 10,000 Units by mouth once a week.     clindamycin (CLEOCIN T) 1 % external solution Apply topically.     Coenzyme Q10 (CO Q10) 100 MG CAPS Take 1 tablet by mouth daily.     econazole nitrate 1 % cream Apply topically daily. 15 g 2   fexofenadine (ALLEGRA) 180 MG tablet Take 1 tablet (180 mg total) by mouth daily. 90 tablet 3   fluticasone (FLONASE) 50 MCG/ACT nasal spray USE 2 SPRAYS IN EACH NOSTRIL DAILY 48 g 3   gabapentin (NEURONTIN) 300 MG capsule TAKE 1 TO 2 CAPSULES IN THE EVENING 180 capsule 3   GLUCOSAMINE-CHONDROITIN PO Take by mouth. 1500 mg glucosamine and 1200 mg of Chondroitin TWICE DAILY     hydrocortisone 2.5 % cream Apply topically 2 (two) times daily. 30 g 3   Lancets (ONETOUCH ULTRASOFT) lancets USE TO TEST BLOOD GLUCOSE TWICE DAILY 200 each 3   metFORMIN (GLUCOPHAGE) 500 MG tablet Take 2 tablets with AM meal and 1 tablet with PM meal 270 tablet 1   olmesartan (BENICAR) 20 MG tablet Take 1 tablet (20 mg total) by mouth daily. 90 tablet 3   Omega-3 Fatty Acids (FISH OIL ULTRA) 1400 MG CAPS Take by mouth.     tamsulosin (FLOMAX) 0.4 MG CAPS capsule Take 1 capsule (0.4 mg total) by mouth daily. 90 capsule 1   traMADol (ULTRAM) 50 MG tablet Take 1 tablet (50 mg total) by mouth every 8 (eight) hours as needed. 90 tablet 0   zolpidem (AMBIEN) 5 MG tablet TAKE 1 TABLET AT BEDTIME AS NEEDED FOR SLEEP 90 tablet 0   nitroGLYCERIN (NITROSTAT) 0.4 MG SL tablet Place 1 tablet (0.4 mg total) under the tongue every 5 (five) minutes as needed for chest pain. 90 tablet 3   rosuvastatin (CRESTOR) 40 MG tablet Take 1 tablet (40 mg total) by mouth daily. 90 tablet 3   No facility-administered medications prior to visit.    Allergies  Allergen Reactions   Iohexol      Desc: pt. states severe reaction, dr. told patient never to have the dye again     ROS Review of Systems   Constitutional:  Negative for fatigue.  Eyes:  Negative for visual disturbance.  Respiratory:  Negative for cough, chest tightness and shortness of breath.   Cardiovascular:  Negative for chest pain, palpitations and leg swelling.  Neurological:  Negative for dizziness, syncope, weakness, light-headedness and headaches.  Objective:    Physical Exam Constitutional:      Appearance: Normal appearance.  Cardiovascular:     Rate and Rhythm: Normal rate and regular rhythm.  Skin:    Comments: Feet reveal no current callus or ulcer.  He does have some mild impairment with monofilament testing in both feet.  Feet are warm to touch with 1+ dorsalis pedis and trace posterior tibial pulse bilaterally.  Good capillary refill.  Neurological:     Mental Status: He is alert.    BP 110/62 (BP Location: Left Arm, Patient Position: Sitting, Cuff Size: Normal)   Pulse 72   Temp (!) 97.4 F (36.3 C) (Oral)   Wt 175 lb 9.6 oz (79.7 kg)   SpO2 94%   BMI 28.34 kg/m  Wt Readings from Last 3 Encounters:  10/07/21 175 lb 9.6 oz (79.7 kg)  09/23/21 177 lb (80.3 kg)  09/16/21 177 lb (80.3 kg)     Health Maintenance Due  Topic Date Due   COVID-19 Vaccine (4 - Booster for Pfizer series) 10/04/2020    There are no preventive care reminders to display for this patient.  Lab Results  Component Value Date   TSH 0.68 09/17/2021   Lab Results  Component Value Date   WBC 6.5 09/17/2021   HGB 13.7 09/17/2021   HCT 41.1 09/17/2021   MCV 87.2 09/17/2021   PLT 195.0 09/17/2021   Lab Results  Component Value Date   NA 134 (L) 09/17/2021   K 4.1 09/17/2021   CO2 26 09/17/2021   GLUCOSE 120 (H) 09/17/2021   BUN 26 (H) 09/17/2021   CREATININE 1.43 09/17/2021   BILITOT 1.0 09/17/2021   ALKPHOS 38 (L) 09/17/2021   AST 23 09/17/2021   ALT 20 09/17/2021   PROT 7.0 09/17/2021   ALBUMIN 3.9 09/17/2021   CALCIUM 9.2 09/17/2021   GFR 45.70 (L) 09/17/2021   Lab Results  Component Value Date    CHOL 132 09/17/2021   Lab Results  Component Value Date   HDL 41.90 09/17/2021   Lab Results  Component Value Date   LDLCALC 64 09/17/2021   Lab Results  Component Value Date   TRIG 133.0 09/17/2021   Lab Results  Component Value Date   CHOLHDL 3 09/17/2021   Lab Results  Component Value Date   HGBA1C 7.0 (H) 09/17/2021      Assessment & Plan:   Type 2 diabetes fairly well controlled with recent A1c 7.0%.  Patient does have history of diabetic peripheral neuropathy and hammertoes with prior history of callus.  We do feel he will benefit from diabetic shoes with inserts to reduce future complications.  Handout on diabetic foot care given.  We completed necessary paperwork for him to get replacement diabetic shoes.  He will continue close follow-up with his primary provider regarding his ongoing diabetes care  No orders of the defined types were placed in this encounter.   Follow-up: No follow-ups on file.    Carolann Littler, MD

## 2021-10-07 NOTE — Telephone Encounter (Signed)
Please advise 

## 2021-10-12 NOTE — Progress Notes (Signed)
Cardiology Office Note   Date:  10/14/2021   ID:  [name], DOB 10/13/1941, MRN [id number]  PCP:  Dorothyann Peng, NP  Cardiologist:   Minus Breeding, MD  Referring:  Dorothyann Peng, NP  Chief Complaint  Patient presents with   Coronary Artery Disease      History of Present Illness: Ian [name] is a 80 y.o. male who presents for follow up of CAD. He had a heart catheterization in 2018.  He had a well-preserved ejection fraction.  He had a left main 30% stenosis.  He had some mild plaque in his LAD with a 60 to 70% lesion in the first diagonal.  He had a small nondominant right.  There is no mention of any significant circumflex stenosis.  He had a stress perfusion study in 2010.  Demonstrated no significant abnormalities.  Another stress study in 2012 was unremarkable.  Stress echo in 2014 and most recently in 2019 were both unremarkable.  He is sinus bradycardia.  He had right bundle branch block.  Is been followed for very mild carotid plaque.  He had a POET (Plain Old Exercise Treadmill) in March .    Since I last saw him he has done okay.  He still goes up to [place] and spends knee into October up there.  He does his walking.  He is walking on level ground rather than going up and down hills and probably has a little more dyspnea than he used to but overall he thinks he feels well. The patient denies any new symptoms such as chest discomfort, neck or arm discomfort. There has been no new shortness of breath, PND or orthopnea. There have been no reported palpitations, presyncope or syncope.    Past Medical History:  Diagnosis Date   Allergy    Apnea, sleep 07/28/07   NPSG [place], AHI 79.9   Carotid artery stenosis    mild 04/2010, 05/2012   Coronary heart disease 2008   Cath 2008, 60-70% proximal diagonal 1 stenosis,  RCA 40% stenosis.  nonischemic Lexiscan 03/2011   Diabetes mellitus    type II   GERD (gastroesophageal reflux disease)    Hyperlipidemia     Hypertension    Hypospadias     Past Surgical History:  Procedure Laterality Date   NASAL SINUS SURGERY     NISSEN FUNDOPLICATION     TONSILLECTOMY     VEIN LIGATION AND STRIPPING       Current Outpatient Medications  Medication Sig Dispense Refill   aspirin 81 MG tablet Take 1 tablet (81 mg total) by mouth daily. 90 tablet 3   Cholecalciferol (VITAMIN D3 PO) Take 10,000 Units by mouth once a week.     clindamycin (CLEOCIN T) 1 % external solution Apply topically.     Coenzyme Q10 (CO Q10) 100 MG CAPS Take 1 tablet by mouth daily.     econazole nitrate 1 % cream Apply topically daily. 15 g 2   fexofenadine (ALLEGRA) 180 MG tablet Take 1 tablet (180 mg total) by mouth daily. 90 tablet 3   fluticasone (FLONASE) 50 MCG/ACT nasal spray USE 2 SPRAYS IN EACH NOSTRIL DAILY 48 g 3   gabapentin (NEURONTIN) 300 MG capsule TAKE 1 TO 2 CAPSULES IN THE EVENING 180 capsule 3   GLUCOSAMINE-CHONDROITIN PO Take by mouth. 1500 mg glucosamine and 1200 mg of Chondroitin TWICE DAILY     hydrocortisone 2.5 % cream Apply topically 2 (two) times daily. 30 g 3  Lancets (ONETOUCH ULTRASOFT) lancets USE TO TEST BLOOD GLUCOSE TWICE DAILY 200 each 3   metFORMIN (GLUCOPHAGE) 500 MG tablet Take 2 tablets with AM meal and 1 tablet with PM meal 270 tablet 1   olmesartan (BENICAR) 20 MG tablet Take 1 tablet (20 mg total) by mouth daily. 90 tablet 3   Omega-3 Fatty Acids (FISH OIL ULTRA) 1400 MG CAPS Take by mouth.     tamsulosin (FLOMAX) 0.4 MG CAPS capsule Take 1 capsule (0.4 mg total) by mouth daily. 90 capsule 1   traMADol (ULTRAM) 50 MG tablet Take 1 tablet (50 mg total) by mouth every 8 (eight) hours as needed. 90 tablet 0   zolpidem (AMBIEN) 5 MG tablet TAKE 1 TABLET AT BEDTIME AS NEEDED FOR SLEEP 90 tablet 0   nitroGLYCERIN (NITROSTAT) 0.4 MG SL tablet Place 1 tablet (0.4 mg total) under the tongue every 5 (five) minutes as needed for chest pain. 90 tablet 3   rosuvastatin (CRESTOR) 40 MG tablet Take 1 tablet  (40 mg total) by mouth daily. 90 tablet 3   No current facility-administered medications for this visit.    Allergies:   Iohexol    ROS:  Please see the history of present illness.   Otherwise, review of systems are positive for none.   All other systems are reviewed and negative.    PHYSICAL EXAM: VS:  BP 130/68   Pulse (!) 59   Ht 5\' 7"  (1.702 m)   Wt 173 lb (78.5 kg)   SpO2 94%   BMI 27.10 kg/m  , BMI Body mass index is 27.1 kg/m.  GENERAL:  Well appearing NECK:  No jugular venous distention, waveform within normal limits, carotid upstroke brisk and symmetric, no bruits, no thyromegaly LUNGS:  Clear to auscultation bilaterally CHEST:  Unremarkable HEART:  PMI not displaced or sustained,S1 and S2 within normal limits, no S3, no S4, no clicks, no rubs, no murmurs ABD:  Flat, positive bowel sounds normal in frequency in pitch, no bruits, no rebound, no guarding, no midline pulsatile mass, no hepatomegaly, no splenomegaly EXT:  2 plus pulses throughout, no edema, no cyanosis no clubbing    GENERAL:  Well appearing NECK:  No jugular venous distention, waveform within normal limits, carotid upstroke brisk and symmetric, no bruits, no thyromegaly LUNGS:  Clear to auscultation bilaterally CHEST:  Unremarkable HEART:  PMI not displaced or sustained,S1 and S2 within normal limits, no S3, no S4, no clicks, no rubs, no murmurs ABD:  Flat, positive bowel sounds normal in frequency in pitch, no bruits, no rebound, no guarding, no midline pulsatile mass, no hepatomegaly, no splenomegaly EXT:  2 plus pulses throughout, no edema, no cyanosis no clubbing  EKG:  EKG is  not ordered today. NA  Recent Labs: 09/17/2021: ALT 20; BUN 26; Creatinine, Ser 1.43; Hemoglobin 13.7; Platelets 195.0; Potassium 4.1; Sodium 134; TSH 0.68    Lipid Panel    Component Value Date/Time   CHOL 132 09/17/2021 1342   CHOL 134 04/12/2020 1217   TRIG 133.0 09/17/2021 1342   HDL 41.90 09/17/2021 1342   HDL  47 04/12/2020 1217   CHOLHDL 3 09/17/2021 1342   VLDL 26.6 09/17/2021 1342   LDLCALC 64 09/17/2021 1342   LDLCALC 64 08/30/2020 0845     Lab Results  Component Value Date   CREATININE 1.43 09/17/2021    Wt Readings from Last 3 Encounters:  10/14/21 173 lb (78.5 kg)  10/07/21 175 lb 9.6 oz (79.7 kg)  09/23/21 177  lb (80.3 kg)      Other studies Reviewed: Additional studies/ records that were reviewed today include: None Review of the above records demonstrates:   See above.   ASSESSMENT AND PLAN:  CAD:   The patient has no new sypmtoms.  No further cardiovascular testing is indicated.  We will continue with aggressive risk reduction and meds as listed.  HTN:   The blood pressure is at target.  No change in therapy.   DYSLIPIDEMIA:   LDL was 64 with an HDL of 41.9.  No change in therapy.   DM:  His A1c was 7.0.  No change in therapy.   CKD:   Creatinine was 1.43 which is improved from previous.   DIZZINESS:   He has had no further change in symptoms.  This is not leading to any presyncope or syncope and he has had a work-up of this.  No further change in therapy or further work-up is planned unless he has increasing symptoms.   Current medicines are reviewed at length with the patient today.  The patient does not have concerns regarding medicines.  The following changes have been made: None  Labs/ tests ordered today include: None  Orders Placed This Encounter  Procedures   EKG 12-Lead      Disposition:   FU with me in 12 months.     Signed, Minus Breeding, MD  10/14/2021 5:14 PM    [facility]

## 2021-10-13 DIAGNOSIS — E119 Type 2 diabetes mellitus without complications: Secondary | ICD-10-CM | POA: Diagnosis not present

## 2021-10-13 DIAGNOSIS — H2511 Age-related nuclear cataract, right eye: Secondary | ICD-10-CM | POA: Diagnosis not present

## 2021-10-13 DIAGNOSIS — H35373 Puckering of macula, bilateral: Secondary | ICD-10-CM | POA: Diagnosis not present

## 2021-10-13 DIAGNOSIS — H25011 Cortical age-related cataract, right eye: Secondary | ICD-10-CM | POA: Diagnosis not present

## 2021-10-13 DIAGNOSIS — H40013 Open angle with borderline findings, low risk, bilateral: Secondary | ICD-10-CM | POA: Diagnosis not present

## 2021-10-14 ENCOUNTER — Encounter: Payer: Self-pay | Admitting: Cardiology

## 2021-10-14 ENCOUNTER — Other Ambulatory Visit: Payer: Self-pay

## 2021-10-14 ENCOUNTER — Ambulatory Visit (INDEPENDENT_AMBULATORY_CARE_PROVIDER_SITE_OTHER): Payer: Medicare Other | Admitting: Cardiology

## 2021-10-14 VITALS — BP 130/68 | HR 59 | Ht 67.0 in | Wt 173.0 lb

## 2021-10-14 DIAGNOSIS — I2510 Atherosclerotic heart disease of native coronary artery without angina pectoris: Secondary | ICD-10-CM

## 2021-10-14 DIAGNOSIS — R42 Dizziness and giddiness: Secondary | ICD-10-CM | POA: Diagnosis not present

## 2021-10-14 DIAGNOSIS — I10 Essential (primary) hypertension: Secondary | ICD-10-CM

## 2021-10-14 DIAGNOSIS — E785 Hyperlipidemia, unspecified: Secondary | ICD-10-CM

## 2021-10-14 DIAGNOSIS — E118 Type 2 diabetes mellitus with unspecified complications: Secondary | ICD-10-CM | POA: Diagnosis not present

## 2021-10-14 DIAGNOSIS — N182 Chronic kidney disease, stage 2 (mild): Secondary | ICD-10-CM | POA: Diagnosis not present

## 2021-10-14 NOTE — Patient Instructions (Addendum)
Medication Instructions:  Your Physician recommend you continue on your current medication as directed.    *If you need a refill on your cardiac medications before your next appointment, please call your pharmacy*   Follow-Up: At [facility], you and your health needs are our priority.  As part of our continuing mission to provide you with exceptional heart care, we have created designated Provider Care Teams.  These Care Teams include your primary Cardiologist (physician) and Advanced Practice Providers (APPs -  Physician Assistants and Nurse Practitioners) who all work together to provide you with the care you need, when you need it.  We recommend signing up for the patient portal called "MyChart".  Sign up information is provided on this After Visit Summary.  MyChart is used to connect with patients for Virtual Visits (Telemedicine).  Patients are able to view lab/test results, encounter notes, upcoming appointments, etc.  Non-urgent messages can be sent to your provider as well.   To learn more about what you can do with MyChart, go to [email or web address].    Your next appointment:   12 month(s)  The format for your next appointment:   In Person  Provider:   Dr. Vita Barley, MD

## 2021-10-14 NOTE — Progress Notes (Deleted)
Cardiology Office Note   Date:  10/14/2021   ID:  Dimitris, Shanahan [birth year], MRN [id number]  PCP:  Dorothyann Peng, NP  Cardiologist:   Ezequiel Essex, MD  Referring:  Dorothyann Peng, NP  No chief complaint on file.    History of Present Illness: Ian [name] is a 80 y.o. male who presents for follow up of CAD. He had a heart catheterization in 2018.  He had a well-preserved ejection fraction.  He had a left main 30% stenosis.  He had some mild plaque in his LAD with a 60 to 70% lesion in the first diagonal.  He had a small nondominant right.  There is no mention of any significant circumflex stenosis.  He had a stress perfusion study in 2010.  Demonstrated no significant abnormalities.  Another stress study in 2012 was unremarkable.  Stress echo in 2014 and most recently in 2019 were both unremarkable.  He is sinus bradycardia.  He had right bundle branch block.  Is been followed for very mild carotid plaque.  Complained of continued DOE at last cardiology appointment with me. POET 01/21/21 shoed no high risk findings. Since I last saw him ***  *** he travels back and forth to [place].    *** He did see his other cardiologist in [place] this summer.  There were no change in therapies or further testing done.  He has done well except he has noticed some increased shortness of breath when he is climbing up an incline here.  He has to stop midway up the heel that he walks most days.  He gets short of breath.  He catches his breath and can go on.  He does not have any resting shortness of breath, PND or orthopnea.  He does having palpitations, presyncope or syncope.  He denies any chest pressure, neck or arm discomfort.  He has had no weight gain or edema.  *** He does still describe occasional dizziness which lasts very briefly.  It is not positional.   Past Medical History:  Diagnosis Date   Allergy    Apnea, sleep 07/28/07   NPSG [place], AHI 79.9   Carotid artery stenosis     mild 04/2010, 05/2012   Coronary heart disease 2008   Cath 2008, 60-70% proximal diagonal 1 stenosis,  RCA 40% stenosis.  nonischemic Lexiscan 03/2011   Diabetes mellitus    type II   GERD (gastroesophageal reflux disease)    Hyperlipidemia    Hypertension    Hypospadias     Past Surgical History:  Procedure Laterality Date   NASAL SINUS SURGERY     NISSEN FUNDOPLICATION     TONSILLECTOMY     VEIN LIGATION AND STRIPPING       Current Outpatient Medications  Medication Sig Dispense Refill   aspirin 81 MG tablet Take 1 tablet (81 mg total) by mouth daily. 90 tablet 3   Cholecalciferol (VITAMIN D3 PO) Take 10,000 Units by mouth once a week.     clindamycin (CLEOCIN T) 1 % external solution Apply topically.     Coenzyme Q10 (CO Q10) 100 MG CAPS Take 1 tablet by mouth daily.     econazole nitrate 1 % cream Apply topically daily. 15 g 2   fexofenadine (ALLEGRA) 180 MG tablet Take 1 tablet (180 mg total) by mouth daily. 90 tablet 3   fluticasone (FLONASE) 50 MCG/ACT nasal spray USE 2 SPRAYS IN EACH NOSTRIL DAILY 48 g 3   gabapentin (NEURONTIN)  300 MG capsule TAKE 1 TO 2 CAPSULES IN THE EVENING 180 capsule 3   GLUCOSAMINE-CHONDROITIN PO Take by mouth. 1500 mg glucosamine and 1200 mg of Chondroitin TWICE DAILY     hydrocortisone 2.5 % cream Apply topically 2 (two) times daily. 30 g 3   Lancets (ONETOUCH ULTRASOFT) lancets USE TO TEST BLOOD GLUCOSE TWICE DAILY 200 each 3   metFORMIN (GLUCOPHAGE) 500 MG tablet Take 2 tablets with AM meal and 1 tablet with PM meal 270 tablet 1   nitroGLYCERIN (NITROSTAT) 0.4 MG SL tablet Place 1 tablet (0.4 mg total) under the tongue every 5 (five) minutes as needed for chest pain. 90 tablet 3   olmesartan (BENICAR) 20 MG tablet Take 1 tablet (20 mg total) by mouth daily. 90 tablet 3   Omega-3 Fatty Acids (FISH OIL ULTRA) 1400 MG CAPS Take by mouth.     rosuvastatin (CRESTOR) 40 MG tablet Take 1 tablet (40 mg total) by mouth daily. 90 tablet 3   tamsulosin  (FLOMAX) 0.4 MG CAPS capsule Take 1 capsule (0.4 mg total) by mouth daily. 90 capsule 1   traMADol (ULTRAM) 50 MG tablet Take 1 tablet (50 mg total) by mouth every 8 (eight) hours as needed. 90 tablet 0   zolpidem (AMBIEN) 5 MG tablet TAKE 1 TABLET AT BEDTIME AS NEEDED FOR SLEEP 90 tablet 0   No current facility-administered medications for this visit.    Allergies:   Iohexol    ROS:  Please see the history of present illness.   Otherwise, review of systems are positive for  *** . All other systems are reviewed and negative.    PHYSICAL EXAM: VS:  There were no vitals taken for this visit. , BMI There is no height or weight on file to calculate BMI.  GENERAL:  Well appearing NECK:  No jugular venous distention, waveform within normal limits, carotid upstroke brisk and symmetric, no bruits, no thyromegaly LUNGS:  Clear to auscultation bilaterally CHEST:  Unremarkable HEART:  PMI not displaced or sustained,S1 and S2 within normal limits, no S3, no S4, no clicks, no rubs, *** murmurs ABD:  Flat, positive bowel sounds normal in frequency in pitch, no bruits, no rebound, no guarding, no midline pulsatile mass, no hepatomegaly, no splenomegaly EXT:  2 plus pulses throughout, no edema, no cyanosis no clubbing    GENERAL:  Well appearing NECK:  No jugular venous distention, waveform within normal limits, carotid upstroke brisk and symmetric, no bruits, no thyromegaly LUNGS:  Clear to auscultation bilaterally CHEST:  Unremarkable HEART:  PMI not displaced or sustained,S1 and S2 within normal limits, no S3, no S4, no clicks, no rubs, no murmurs ABD:  Flat, positive bowel sounds normal in frequency in pitch, no bruits, no rebound, no guarding, no midline pulsatile mass, no hepatomegaly, no splenomegaly EXT:  2 plus pulses throughout, no edema, no cyanosis no clubbing    EKG:  EKG is  *** ordered today. Sinus rhythm, right bundle branch block, rate ***, intervals within normal limits, no  acute ST-T wave changes.  Recent Labs: 09/17/2021: ALT 20; BUN 26; Creatinine, Ser 1.43; Hemoglobin 13.7; Platelets 195.0; Potassium 4.1; Sodium 134; TSH 0.68    Lipid Panel    Component Value Date/Time   CHOL 132 09/17/2021 1342   CHOL 134 04/12/2020 1217   TRIG 133.0 09/17/2021 1342   HDL 41.90 09/17/2021 1342   HDL 47 04/12/2020 1217   CHOLHDL 3 09/17/2021 1342   VLDL 26.6 09/17/2021 1342   LDLCALC  64 09/17/2021 1342   LDLCALC 64 08/30/2020 0845     Lab Results  Component Value Date   CREATININE 1.43 09/17/2021    Wt Readings from Last 3 Encounters:  10/07/21 175 lb 9.6 oz (79.7 kg)  09/23/21 177 lb (80.3 kg)  09/16/21 177 lb (80.3 kg)      Other studies Reviewed: Additional studies/ records that were reviewed today include: None Review of the above records demonstrates:   See above.   ASSESSMENT AND PLAN:  CAD:   On 01/21/21, POET (Plain Old Exercise Treadmill) did not show any high risk features. Obtained due to continued DOE. ***  HTN:   The blood pressure is *** controlled.  No change in therapy.   DYSLIPIDEMIA:   LDL was 64,  HDL 42, at goal for primary prevention  No change in therapy.   DM:  His A1c was 7.0, at goal.  This is followed by  Dorothyann Peng, NP   CKD:   Creatinine was 1.43 and he does see nephrology.   DIZZINESS:   *** We talked about this at length.  This is very brief lasting for a few seconds at a time.  It is not positional.  I do not think it has anything to do with his medications.  It could be an arrhythmia but he thinks it infrequent enough that we are not going to pursue further evaluation unless it becomes more problematic.   Current medicines are reviewed at length with the patient today.  The patient does not have concerns regarding medicines.  The following changes have been made: ***  Labs/ tests ordered today include: EKG  No orders of the defined types were placed in this encounter.    Disposition:   FU with me in 12  months.     Signed, Ezequiel Essex, MD  PGY-2 [facility] 10/14/2021 3:06 PM    [facility]

## 2021-10-22 ENCOUNTER — Other Ambulatory Visit: Payer: Self-pay | Admitting: Adult Health

## 2021-10-22 ENCOUNTER — Encounter: Payer: Self-pay | Admitting: Adult Health

## 2021-10-22 DIAGNOSIS — Z760 Encounter for issue of repeat prescription: Secondary | ICD-10-CM

## 2021-10-22 MED ORDER — TRAMADOL HCL 50 MG PO TABS: 50.0000 mg | ORAL_TABLET | Freq: Three times a day (TID) | ORAL | 0 refills | Status: DC | PRN

## 2021-10-28 ENCOUNTER — Telehealth: Payer: Self-pay | Admitting: Adult Health

## 2021-10-28 NOTE — Telephone Encounter (Signed)
Left message for patient to call back and schedule Medicare Annual Wellness Visit (AWV) either virtually or in office. Left  my Ian [name] number [phone]   Last AWV ;10/15/20 please schedule at anytime with [facility] Nurse Health Advisor 1 or 2   This should be a 45 minute visit.

## 2021-11-06 ENCOUNTER — Other Ambulatory Visit: Payer: Self-pay | Admitting: Adult Health

## 2021-11-11 NOTE — Telephone Encounter (Signed)
Okay for refill?    LOV  09/23/2021  7:30 AM Comp Dorothyann Peng, NP [facility] f/u//tes

## 2021-11-14 DIAGNOSIS — E785 Hyperlipidemia, unspecified: Secondary | ICD-10-CM | POA: Diagnosis not present

## 2021-11-14 DIAGNOSIS — I129 Hypertensive chronic kidney disease with stage 1 through stage 4 chronic kidney disease, or unspecified chronic kidney disease: Secondary | ICD-10-CM | POA: Diagnosis not present

## 2021-11-14 DIAGNOSIS — N400 Enlarged prostate without lower urinary tract symptoms: Secondary | ICD-10-CM | POA: Diagnosis not present

## 2021-11-14 DIAGNOSIS — I2510 Atherosclerotic heart disease of native coronary artery without angina pectoris: Secondary | ICD-10-CM | POA: Diagnosis not present

## 2021-11-14 DIAGNOSIS — N1831 Chronic kidney disease, stage 3a: Secondary | ICD-10-CM | POA: Diagnosis not present

## 2021-11-14 DIAGNOSIS — E1122 Type 2 diabetes mellitus with diabetic chronic kidney disease: Secondary | ICD-10-CM | POA: Diagnosis not present

## 2021-11-18 DIAGNOSIS — H25811 Combined forms of age-related cataract, right eye: Secondary | ICD-10-CM | POA: Diagnosis not present

## 2021-11-18 DIAGNOSIS — H2511 Age-related nuclear cataract, right eye: Secondary | ICD-10-CM | POA: Diagnosis not present

## 2021-11-18 DIAGNOSIS — H25011 Cortical age-related cataract, right eye: Secondary | ICD-10-CM | POA: Diagnosis not present

## 2021-11-21 ENCOUNTER — Other Ambulatory Visit: Payer: Self-pay | Admitting: Adult Health

## 2021-11-21 DIAGNOSIS — Z760 Encounter for issue of repeat prescription: Secondary | ICD-10-CM

## 2021-12-26 ENCOUNTER — Ambulatory Visit (INDEPENDENT_AMBULATORY_CARE_PROVIDER_SITE_OTHER): Payer: Medicare Other

## 2021-12-26 VITALS — Ht 67.0 in | Wt 173.0 lb

## 2021-12-26 DIAGNOSIS — Z0000 Encounter for general adult medical examination without abnormal findings: Secondary | ICD-10-CM | POA: Diagnosis not present

## 2021-12-26 NOTE — Patient Instructions (Addendum)
Mr. Ian [name] , Thank you for taking time to come for your Medicare Wellness Visit. I appreciate your ongoing commitment to your health goals. Please review the following plan we discussed and let me know if I can assist you in the future.   These are the goals we discussed:  Goals       Patient Stated (pt-stated)      Stay healthy.        This is a list of the screening recommended for you and due dates:  Health Maintenance  Topic Date Due   Complete foot exam   12/03/2017   COVID-19 Vaccine (4 - Booster for Pfizer series) 10/04/2020   Eye exam for diabetics  10/16/2021   Hemoglobin A1C  03/17/2022   Pneumonia Vaccine  Completed   Flu Shot  Completed   Zoster (Shingles) Vaccine  Completed   HPV Vaccine  Aged Out   Opioid Pain Medicine Management Opioids are powerful medicines that are used to treat moderate to severe pain. When used for short periods of time, they can help you to: Sleep better. Do better in physical or occupational therapy. Feel better in the first few days after an injury. Recover from surgery. Opioids should be taken with the supervision of a trained health care provider. They should be taken for the shortest period of time possible. This is because opioids can be addictive, and the longer you take opioids, the greater your risk of addiction. This addiction can also be called opioid use disorder. What are the risks? Using opioid pain medicines for longer than 3 days increases your risk of side effects. Side effects include: Constipation. Nausea and vomiting. Breathing difficulties (respiratory depression). Drowsiness. Confusion. Opioid use disorder. Itching. Taking opioid pain medicine for a long period of time can affect your ability to do daily tasks. It also puts you at risk for: Motor vehicle crashes. Depression. Suicide. Heart attack. Overdose, which can be life-threatening. What is a pain treatment plan? A pain treatment plan is an agreement  between you and your health care provider. Pain is unique to each person, and treatments vary depending on your condition. To manage your pain, you and your health care provider need to work together. To help you do this: Discuss the goals of your treatment, including how much pain you might expect to have and how you will manage the pain. Review the risks and benefits of taking opioid medicines. Remember that a good treatment plan uses more than one approach and minimizes the chance of side effects. Be honest about the amount of medicines you take and about any drug or alcohol use. Get pain medicine prescriptions from only one health care provider. Pain can be managed with many types of alternative treatments. Ask your health care provider to refer you to one or more specialists who can help you manage pain through: Physical or occupational therapy. Counseling (cognitive behavioral therapy). Good nutrition. Biofeedback. Massage. Meditation. Non-opioid medicine. Following a gentle exercise program. How to use opioid pain medicine Taking medicine Take your pain medicine exactly as told by your health care provider. Take it only when you need it. If your pain gets less severe, you may take less than your prescribed dose if your health care provider approves. If you are not having pain, do nottake pain medicine unless your health care provider tells you to take it. If your pain is severe, do nottry to treat it yourself by taking more pills than instructed on your prescription. Contact your  health care provider for help. Write down the times when you take your pain medicine. It is easy to become confused while on pain medicine. Writing the time can help you avoid overdose. Take other over-the-counter or prescription medicines only as told by your health care provider. Keeping yourself and others safe  While you are taking opioid pain medicine: Do not drive, use machinery, or power tools. Do  not sign legal documents. Do not drink alcohol. Do not take sleeping pills. Do not supervise children by yourself. Do not do activities that require climbing or being in high places. Do not go to a lake, river, ocean, spa, or swimming pool. Do not share your pain medicine with anyone. Keep pain medicine in a locked cabinet or in a secure area where pets and children cannot reach it. Stopping your use of opioids If you have been taking opioid medicine for more than a few weeks, you may need to slowly decrease (taper) how much you take until you stop completely. Tapering your use of opioids can decrease your risk of symptoms of withdrawal, such as: Pain and cramping in the abdomen. Nausea. Sweating. Sleepiness. Restlessness. Uncontrollable shaking (tremors). Cravings for the medicine. Do not attempt to taper your use of opioids on your own. Talk with your health care provider about how to do this. Your health care provider may prescribe a step-down schedule based on how much medicine you are taking and how long you have been taking it. Getting rid of leftover pills Do not save any leftover pills. Get rid of leftover pills safely by: Taking the medicine to a prescription take-back program. This is usually offered by the county or law enforcement. Bringing them to a pharmacy that has a drug disposal container. Flushing them down the toilet. Check the label or package insert of your medicine to see whether this is safe to do. Throwing them out in the trash. Check the label or package insert of your medicine to see whether this is safe to do. If it is safe to throw it out, remove the medicine from the original container, put it into a sealable bag or container, and mix it with used coffee grounds, food scraps, dirt, or cat litter before putting it in the trash. Follow these instructions at home: Activity Do exercises as told by your health care provider. Avoid activities that make your pain  worse. Return to your normal activities as told by your health care provider. Ask your health care provider what activities are safe for you. General instructions You may need to take these actions to prevent or treat constipation: Drink enough fluid to keep your urine pale yellow. Take over-the-counter or prescription medicines. Eat foods that are high in fiber, such as beans, whole grains, and fresh fruits and vegetables. Limit foods that are high in fat and processed sugars, such as fried or sweet foods. Keep all follow-up visits. This is important. Where to find support If you have been taking opioids for a long time, you may benefit from receiving support for quitting from a local support group or counselor. Ask your health care provider for a referral to these resources in your area. Where to find more information Centers for Disease Control and Prevention (CDC): http://www.wolf.info/ [place] Food and Drug Administration (FDA): [place] Get help right away if: You may have taken too much of an opioid (overdosed). Common symptoms of an overdose: Your breathing is slower or more shallow than normal. You have a very slow heartbeat (pulse).  You have slurred speech. You have nausea and vomiting. Your pupils become very small. You have other potential symptoms: You are very confused. You faint or feel like you will faint. You have cold, clammy skin. You have blue lips or fingernails. You have thoughts of harming yourself or harming others. These symptoms may represent a serious problem that is an emergency. Do not wait to see if the symptoms will go away. Get medical help right away. Call your local emergency services (911 in the [place] Do not drive yourself to the hospital.  If you ever feel like you may hurt yourself or others, or have thoughts about taking your own life, get help right away. Go to your nearest emergency department or: Call your local emergency services (911 in the [place] Call  the [facility] 570-280-6765 in the [place] Call a suicide crisis helpline, such as the [place] at 807 808 3486 or 988 in the [place]. This is open 24 hours a day in the [place] Text the Crisis Text Line at (213) 019-1735 (in the [place] Summary Opioid medicines can help you manage moderate to severe pain for a short period of time. A pain treatment plan is an agreement between you and your health care provider. Discuss the goals of your treatment, including how much pain you might expect to have and how you will manage the pain. If you think that you or someone else may have taken too much of an opioid, get medical help right away. This information is not intended to replace advice given to you by your health care provider. Make sure you discuss any questions you have with your health care provider. Document Revised: 05/21/2021 Document Reviewed: 02/05/2021 Elsevier Patient Education  [place] directives: Yes Patient will bring copy  Conditions/risks identified: None  Next appointment: Follow up in one year for your annual wellness visit.   Preventive Care 56 Years and Older, Male Preventive care refers to lifestyle choices and visits with your health care provider that can promote health and wellness. What does preventive care include? A yearly physical exam. This is also called an annual well check. Dental exams once or twice a year. Routine eye exams. Ask your health care provider how often you should have your eyes checked. Personal lifestyle choices, including: Daily care of your teeth and gums. Regular physical activity. Eating a healthy diet. Avoiding tobacco and drug use. Limiting alcohol use. Practicing safe sex. Taking low doses of aspirin every day. Taking vitamin and mineral supplements as recommended by your health care provider. What happens during an annual well check? The services and screenings done by your  health care provider during your annual well check will depend on your age, overall health, lifestyle risk factors, and family history of disease. Counseling  Your health care provider may ask you questions about your: Alcohol use. Tobacco use. Drug use. Emotional well-being. Home and relationship well-being. Sexual activity. Eating habits. History of falls. Memory and ability to understand (cognition). Work and work Statistician. Screening  You may have the following tests or measurements: Height, weight, and BMI. Blood pressure. Lipid and cholesterol levels. These may be checked every 5 years, or more frequently if you are over 64 years old. Skin check. Lung cancer screening. You may have this screening every year starting at age 57 if you have a 30-pack-year history of smoking and currently smoke or have quit within the past 15 years. Fecal occult blood test (FOBT) of  the stool. You may have this test every year starting at age 46. Flexible sigmoidoscopy or colonoscopy. You may have a sigmoidoscopy every 5 years or a colonoscopy every 10 years starting at age 15. Prostate cancer screening. Recommendations will vary depending on your family history and other risks. Hepatitis C blood test. Hepatitis B blood test. Sexually transmitted disease (STD) testing. Diabetes screening. This is done by checking your blood sugar (glucose) after you have not eaten for a while (fasting). You may have this done every 1-3 years. Abdominal aortic aneurysm (AAA) screening. You may need this if you are a current or former smoker. Osteoporosis. You may be screened starting at age 79 if you are at high risk. Talk with your health care provider about your test results, treatment options, and if necessary, the need for more tests. Vaccines  Your health care provider may recommend certain vaccines, such as: Influenza vaccine. This is recommended every year. Tetanus, diphtheria, and acellular pertussis  (Tdap, Td) vaccine. You may need a Td booster every 10 years. Zoster vaccine. You may need this after age 53. Pneumococcal 13-valent conjugate (PCV13) vaccine. One dose is recommended after age 47. Pneumococcal polysaccharide (PPSV23) vaccine. One dose is recommended after age 66. Talk to your health care provider about which screenings and vaccines you need and how often you need them. This information is not intended to replace advice given to you by your health care provider. Make sure you discuss any questions you have with your health care provider. Document Released: 11/22/2015 Document Revised: 07/15/2016 Document Reviewed: 08/27/2015 Elsevier Interactive Patient Education  2017 [place] Prevention in the Home Falls can cause injuries. They can happen to people of all ages. There are many things you can do to make your home safe and to help prevent falls. What can I do on the outside of my home? Regularly fix the edges of walkways and driveways and fix any cracks. Remove anything that might make you trip as you walk through a door, such as a raised step or threshold. Trim any bushes or trees on the path to your home. Use bright outdoor lighting. Clear any walking paths of anything that might make someone trip, such as rocks or tools. Regularly check to see if handrails are loose or broken. Make sure that both sides of any steps have handrails. Any raised decks and porches should have guardrails on the edges. Have any leaves, snow, or ice cleared regularly. Use sand or salt on walking paths during winter. Clean up any spills in your garage right away. This includes oil or grease spills. What can I do in the bathroom? Use night lights. Install grab bars by the toilet and in the tub and shower. Do not use towel bars as grab bars. Use non-skid mats or decals in the tub or shower. If you need to sit down in the shower, use a plastic, non-slip stool. Keep the floor dry. Clean  up any water that spills on the floor as soon as it happens. Remove soap buildup in the tub or shower regularly. Attach bath mats securely with double-sided non-slip rug tape. Do not have throw rugs and other things on the floor that can make you trip. What can I do in the bedroom? Use night lights. Make sure that you have a light by your bed that is easy to reach. Do not use any sheets or blankets that are too big for your bed. They should not hang down onto the  floor. Have a firm chair that has side arms. You can use this for support while you get dressed. Do not have throw rugs and other things on the floor that can make you trip. What can I do in the kitchen? Clean up any spills right away. Avoid walking on wet floors. Keep items that you use a lot in easy-to-reach places. If you need to reach something above you, use a strong step stool that has a grab bar. Keep electrical cords out of the way. Do not use floor polish or wax that makes floors slippery. If you must use wax, use non-skid floor wax. Do not have throw rugs and other things on the floor that can make you trip. What can I do with my stairs? Do not leave any items on the stairs. Make sure that there are handrails on both sides of the stairs and use them. Fix handrails that are broken or loose. Make sure that handrails are as long as the stairways. Check any carpeting to make sure that it is firmly attached to the stairs. Fix any carpet that is loose or worn. Avoid having throw rugs at the top or bottom of the stairs. If you do have throw rugs, attach them to the floor with carpet tape. Make sure that you have a light switch at the top of the stairs and the bottom of the stairs. If you do not have them, ask someone to add them for you. What else can I do to help prevent falls? Wear shoes that: Do not have high heels. Have rubber bottoms. Are comfortable and fit you well. Are closed at the toe. Do not wear sandals. If you  use a stepladder: Make sure that it is fully opened. Do not climb a closed stepladder. Make sure that both sides of the stepladder are locked into place. Ask someone to hold it for you, if possible. Clearly mark and make sure that you can see: Any grab bars or handrails. First and last steps. Where the edge of each step is. Use tools that help you move around (mobility aids) if they are needed. These include: Canes. Walkers. Scooters. Crutches. Turn on the lights when you go into a dark area. Replace any light bulbs as soon as they burn out. Set up your furniture so you have a clear path. Avoid moving your furniture around. If any of your floors are uneven, fix them. If there are any pets around you, be aware of where they are. Review your medicines with your doctor. Some medicines can make you feel dizzy. This can increase your chance of falling. Ask your doctor what other things that you can do to help prevent falls. This information is not intended to replace advice given to you by your health care provider. Make sure you discuss any questions you have with your health care provider. Document Released: 08/22/2009 Document Revised: 04/02/2016 Document Reviewed: 11/30/2014 Elsevier Interactive Patient Education  2017 [employer].

## 2021-12-26 NOTE — Progress Notes (Signed)
Subjective:   FOUNT BAHE is a 81 y.o. male who presents for Medicare Annual/Subsequent preventive examination.  Review of Systems    Virtual Visit via Telephone Note  I connected with  SILUS LANZO on 12/26/21 at 11:15 AM EST by telephone and verified that I am speaking with the correct person using two identifiers.  Location: Patient: Home Provider: Office Persons participating in the virtual visit: patient/Nurse Health Advisor   I discussed the limitations, risks, security and privacy concerns of performing an evaluation and management service by telephone and the availability of in person appointments. The patient expressed understanding and agreed to proceed.  Interactive audio and video telecommunications were attempted between this nurse and patient, however failed, due to patient having technical difficulties OR patient did not have access to video capability.  We continued and completed visit with audio only.  Some vital signs may be absent or patient reported.   Criselda Peaches, LPN  Cardiac Risk Factors include: diabetes mellitus;hypertension;male gender     Objective:    Today's Vitals   12/26/21 1120 12/26/21 1122  Weight: 173 lb (78.5 kg)   Height: 5\' 7"  (1.702 m)   PainSc:  5    Body mass index is 27.1 kg/m.  Advanced Directives 12/26/2021 10/15/2020  Does Patient Have a Medical Advance Directive? Yes Yes  Type of Paramedic of [place];Living will Living will  Does patient want to make changes to medical advance directive? No - Patient declined -  Copy of [place] in Chart? No - copy requested -    Current Medications (verified) Outpatient Encounter Medications as of 12/26/2021  Medication Sig   aspirin 81 MG tablet Take 1 tablet (81 mg total) by mouth daily.   Cholecalciferol (VITAMIN D3 PO) Take 10,000 Units by mouth once a week.   clindamycin (CLEOCIN T) 1 % external solution Apply topically.    Coenzyme Q10 (CO Q10) 100 MG CAPS Take 1 tablet by mouth daily.   econazole nitrate 1 % cream Apply topically daily.   fexofenadine (ALLEGRA) 180 MG tablet Take 1 tablet (180 mg total) by mouth daily.   fluticasone (FLONASE) 50 MCG/ACT nasal spray USE 2 SPRAYS IN EACH NOSTRIL DAILY   gabapentin (NEURONTIN) 300 MG capsule TAKE 1 TO 2 CAPSULES IN THE EVENING   GLUCOSAMINE-CHONDROITIN PO Take by mouth. 1500 mg glucosamine and 1200 mg of Chondroitin TWICE DAILY   hydrocortisone 2.5 % cream Apply topically 2 (two) times daily.   Lancets (ONETOUCH ULTRASOFT) lancets USE TO TEST BLOOD GLUCOSE TWICE DAILY   metFORMIN (GLUCOPHAGE) 500 MG tablet Take 2 tablets with AM meal and 1 tablet with PM meal   nitroGLYCERIN (NITROSTAT) 0.4 MG SL tablet Place 1 tablet (0.4 mg total) under the tongue every 5 (five) minutes as needed for chest pain.   olmesartan (BENICAR) 20 MG tablet Take 1 tablet (20 mg total) by mouth daily.   Omega-3 Fatty Acids (FISH OIL ULTRA) 1400 MG CAPS Take by mouth.   rosuvastatin (CRESTOR) 40 MG tablet Take 1 tablet (40 mg total) by mouth daily.   tamsulosin (FLOMAX) 0.4 MG CAPS capsule Take 1 capsule (0.4 mg total) by mouth daily.   traMADol (ULTRAM) 50 MG tablet TAKE 1 TABLET EVERY 8 HOURS AS NEEDED   zolpidem (AMBIEN) 5 MG tablet TAKE 1 TABLET AT BEDTIME AS NEEDED FOR SLEEP   No facility-administered encounter medications on file as of 12/26/2021.    Allergies (verified) Iohexol  History: Past Medical History:  Diagnosis Date   Allergy    Apnea, sleep 07/28/07   NPSG [place], AHI 79.9   Carotid artery stenosis    mild 04/2010, 05/2012   Coronary heart disease 2008   Cath 2008, 60-70% proximal diagonal 1 stenosis,  RCA 40% stenosis.  nonischemic Lexiscan 03/2011   Diabetes mellitus    type II   GERD (gastroesophageal reflux disease)    Hyperlipidemia    Hypertension    Hypospadias    Past Surgical History:  Procedure Laterality Date   EYE SURGERY     NASAL SINUS  SURGERY     NISSEN FUNDOPLICATION     TONSILLECTOMY     VEIN LIGATION AND STRIPPING     Family History  Problem Relation Age of Onset   Coronary artery disease Father 20       CABG   Dementia Mother    Social History   Socioeconomic History   Marital status: Married    Spouse name: Not on file   Number of children: 3   Years of education: Not on file   Highest education level: Not on file  Occupational History   Occupation: Retired Games developer: RETIRED  Tobacco Use   Smoking status: Never   Smokeless tobacco: Never  Substance and Sexual Activity   Alcohol use: No    Alcohol/week: 0.0 standard drinks   Drug use: No   Sexual activity: Not on file  Other Topics Concern   Not on file  Social History Narrative   Married.  Three children from first marriage.  First wife died of ovarian cancer.     Retired, did work for [employer] for 44 years.    Social Determinants of Health   Financial Resource Strain: Low Risk    Difficulty of Paying Living Expenses: Not hard at all  Food Insecurity: No Food Insecurity   Worried About Charity fundraiser in the Last Year: Never true   [place] in the Last Year: Never true  Transportation Needs: No Transportation Needs   Lack of Transportation (Medical): No   Lack of Transportation (Non-Medical): No  Physical Activity: Unknown   Days of Exercise per Week: Not on file   Minutes of Exercise per Session: 30 min  Stress: No Stress Concern Present   Feeling of Stress : Not at all  Social Connections: Socially Integrated   Frequency of Communication with Friends and Family: More than three times a week   Frequency of Social Gatherings with Friends and Family: More than three times a week   Attends Religious Services: More than 4 times per year   Active Member of [employer] or Organizations: Yes   Attends Music therapist: More than 4 times per year   Marital Status: Married    Tobacco  Counseling Counseling given: Not Answered   Clinical Intake:   Diabetic? No   Activities of Daily Living In your present state of health, do you have any difficulty performing the following activities: 12/26/2021  Hearing? N  Vision? N  Difficulty concentrating or making decisions? N  Walking or climbing stairs? N  Dressing or bathing? N  Doing errands, shopping? N  Preparing Food and eating ? N  Using the Toilet? N  In the past six months, have you accidently leaked urine? N  Do you have problems with loss of bowel control? N  Managing your Medications? N  Managing your Finances? N  Housekeeping or managing your Housekeeping? N  Some recent data might be hidden    Patient Care Team: Dorothyann Peng, NP as PCP - General (Family Medicine) Minus Breeding, MD as PCP - Cardiology (Cardiology)  Indicate any recent Medical Services you may have received from other than [facility] providers in the past year (date may be approximate).     Assessment:   This is a routine wellness examination for [place].  Hearing/Vision screen Hearing Screening - Comments:: No hearing difficulty  Vision Screening - Comments:: Wears glasses. Followed by Dr Herbert Deaner  Dietary issues and exercise activities discussed: Exercise limited by: None identified   Goals Addressed               This Visit's Progress     Patient Stated (pt-stated)        Stay healthy.       Depression Screen PHQ 2/9 Scores 12/26/2021 10/15/2020 01/28/2018 10/24/2015 04/05/2015 04/05/2015 01/19/2014  PHQ - 2 Score 0 0 0 0 0 0 0    Fall Risk Fall Risk  12/26/2021 10/15/2020 01/28/2018 10/24/2015 04/05/2015  Falls in the past year? 0 0 No No No  Number falls in past yr: 0 0 - - -  Injury with Fall? 0 0 - - -  Risk for fall due to : No Fall Risks Impaired balance/gait;Impaired mobility;Impaired vision - - -  Follow up - Falls prevention discussed - - -    FALL RISK PREVENTION PERTAINING TO THE HOME:  Any stairs in or around  the home? No  If so, are there any without handrails? No  Home free of loose throw rugs in walkways, pet beds, electrical cords, etc? Yes  Adequate lighting in your home to reduce risk of falls? Yes   ASSISTIVE DEVICES UTILIZED TO PREVENT FALLS:  Life alert? No  Use of a cane, walker or w/c? No  Grab bars in the bathroom? Yes  Shower chair or bench in shower? Yes  Elevated toilet seat or a handicapped toilet? Yes   TIMED UP AND GO:  Was the test performed? No . Audio Visit  Cognitive Function:     6CIT Screen 12/26/2021 10/15/2020  What Year? 0 points 0 points  What month? 0 points 0 points  What time? 0 points -  Count back from 20 0 points 0 points  Months in reverse 0 points 0 points  Repeat phrase 0 points 2 points  Total Score 0 -    Immunizations Immunization History  Administered Date(s) Administered   Hepatitis A, Adult 03/09/2017, 09/23/2017   Influenza Split 08/15/2012   Influenza Whole 08/17/2008, 07/23/2009, 08/19/2010   Influenza, High Dose Seasonal PF 08/15/2013, 09/04/2014, 08/10/2015, 09/11/2016, 08/14/2017, 08/23/2018   Influenza-Unspecified 07/24/2020, 08/04/2021   PFIZER(Purple Top)SARS-COV-2 Vaccination 12/15/2019, 01/09/2020, 08/09/2020   Pneumococcal Conjugate-13 10/11/2013, 05/30/2014   Pneumococcal Polysaccharide-23 08/09/2006, 07/23/2009   Td 07/01/2009   Zoster Recombinat (Shingrix) 04/02/2017, 06/16/2017   Zoster, Live 09/12/2012    TDAP status: Up to date  Flu Vaccine status: Up to date  Pneumococcal vaccine status: Up to date  Covid-19 vaccine status: Completed vaccines  Qualifies for Shingles Vaccine? Yes   Zostavax completed Yes   Shingrix Completed?: Yes  Screening Tests Health Maintenance  Topic Date Due   FOOT EXAM  12/03/2017   COVID-19 Vaccine (4 - Booster for Pfizer series) 10/04/2020   OPHTHALMOLOGY EXAM  10/16/2021   HEMOGLOBIN A1C  03/17/2022   Pneumonia Vaccine 84+ Years old  Completed   INFLUENZA VACCINE  Completed   Zoster Vaccines- Shingrix  Completed   HPV VACCINES  Aged Out    Health Maintenance  Health Maintenance Due  Topic Date Due   FOOT EXAM  12/03/2017   COVID-19 Vaccine (4 - Booster for Pfizer series) 10/04/2020   OPHTHALMOLOGY EXAM  10/16/2021    Colorectal cancer screening: No longer required.   Lung Cancer Screening: (Low Dose CT Chest recommended if Age 23-80 years, 30 pack-year currently smoking OR have quit w/in 15years.) does not qualify  Additional Screening:  Hepatitis C Screening: does not qualify; Completed   Vision Screening: Recommended annual ophthalmology exams for early detection of glaucoma and other disorders of the eye. Is the patient up to date with their annual eye exam?  Yes  Who is the provider or what is the name of the office in which the patient attends annual eye exams? Dr Herbert Deaner If pt is not established with a provider, would they like to be referred to a provider to establish care? No .   Dental Screening: Recommended annual dental exams for proper oral hygiene  Community Resource Referral / Chronic Care Management:  CRR required this visit?  No   CCM required this visit?  No      Plan:     I have personally reviewed and noted the following in the patients chart:   Medical and social history Use of alcohol, tobacco or illicit drugs  Current medications and supplements including opioid prescriptions. Patient is currently taking opioid prescriptions. Information provided to patient regarding non-opioid alternatives. Patient advised to discuss non-opioid treatment plan with their provider. Functional ability and status Nutritional status Physical activity Advanced directives List of other physicians Hospitalizations, surgeries, and ER visits in previous 12 months Vitals Screenings to include cognitive, depression, and falls Referrals and appointments  In addition, I have reviewed and discussed with patient certain preventive  protocols, quality metrics, and best practice recommendations. A written personalized care plan for preventive services as well as general preventive health recommendations were provided to patient.     Criselda Peaches, LPN   [id number]   Nurse Notes: None

## 2021-12-31 ENCOUNTER — Other Ambulatory Visit: Payer: Self-pay | Admitting: Adult Health

## 2021-12-31 DIAGNOSIS — Z760 Encounter for issue of repeat prescription: Secondary | ICD-10-CM

## 2021-12-31 NOTE — Telephone Encounter (Signed)
Okay for refill?    LOV  09/23/2022    Last Refill   11/25/2021    90 QTY.   1  Refills

## 2022-01-01 ENCOUNTER — Other Ambulatory Visit: Payer: Self-pay | Admitting: Adult Health

## 2022-01-01 DIAGNOSIS — N401 Enlarged prostate with lower urinary tract symptoms: Secondary | ICD-10-CM

## 2022-01-01 NOTE — Telephone Encounter (Signed)
Patient need to schedule an ov for more refills. 

## 2022-01-06 ENCOUNTER — Ambulatory Visit (INDEPENDENT_AMBULATORY_CARE_PROVIDER_SITE_OTHER): Payer: Medicare Other | Admitting: Adult Health

## 2022-01-06 ENCOUNTER — Encounter: Payer: Self-pay | Admitting: Adult Health

## 2022-01-06 VITALS — BP 140/70 | HR 64 | Temp 97.0°F | Ht 67.0 in | Wt 179.0 lb

## 2022-01-06 DIAGNOSIS — E118 Type 2 diabetes mellitus with unspecified complications: Secondary | ICD-10-CM | POA: Diagnosis not present

## 2022-01-06 DIAGNOSIS — E1142 Type 2 diabetes mellitus with diabetic polyneuropathy: Secondary | ICD-10-CM

## 2022-01-06 DIAGNOSIS — F5104 Psychophysiologic insomnia: Secondary | ICD-10-CM

## 2022-01-06 DIAGNOSIS — E782 Mixed hyperlipidemia: Secondary | ICD-10-CM | POA: Diagnosis not present

## 2022-01-06 DIAGNOSIS — N401 Enlarged prostate with lower urinary tract symptoms: Secondary | ICD-10-CM | POA: Diagnosis not present

## 2022-01-06 DIAGNOSIS — I10 Essential (primary) hypertension: Secondary | ICD-10-CM

## 2022-01-06 MED ORDER — TAMSULOSIN HCL 0.4 MG PO CAPS: 0.4000 mg | ORAL_CAPSULE | Freq: Every day | ORAL | 1 refills | Status: DC

## 2022-01-06 MED ORDER — SILDENAFIL CITRATE 25 MG PO TABS: 25.0000 mg | ORAL_TABLET | Freq: Every day | ORAL | 3 refills | Status: DC | PRN

## 2022-01-06 MED ORDER — ECONAZOLE NITRATE 1 % EX CREA: TOPICAL_CREAM | Freq: Every day | CUTANEOUS | 2 refills | Status: AC

## 2022-01-06 MED ORDER — METFORMIN HCL 500 MG PO TABS: ORAL_TABLET | ORAL | 1 refills | Status: DC

## 2022-01-06 NOTE — Progress Notes (Signed)
Subjective:    Patient ID: Ian [name], male    DOB: December 09, 1940, 81 y.o.   MRN: [id number]  HPI 81 year old male who  has a past medical history of Allergy, Apnea, sleep (07/28/07), Carotid artery stenosis, Coronary heart disease (2008), Diabetes mellitus, GERD (gastroesophageal reflux disease), Hyperlipidemia, Hypertension, and Hypospadias.  He presents to the office today for follow-up regarding chronic disease management.  He has a history of diabetes mellitus type 2 which is managed with metformin 1000 mg in the morning and 500 mg in the evening.  He denies episodes of hypoglycemia.  He does check his blood sugars periodically with readings in the 120-140's.  Lab Results  Component Value Date   HGBA1C 7.0 (H) 09/17/2021   Does have a history of diabetic neuropathy for which he takes gabapentin 300 -600 mg in the evening.  Hypertension is managed with Benicar 20 mg daily. BP Readings from Last 3 Encounters:  01/06/22 140/70  10/14/21 130/68  10/07/21 110/62   Due to history of hyperlipidemia/CAD he takes Crestor 40 mg daily.  He denies myalgia or fatigue Lab Results  Component Value Date   CHOL 132 09/17/2021   HDL 41.90 09/17/2021   LDLCALC 64 09/17/2021   TRIG 133.0 09/17/2021   CHOLHDL 3 09/17/2021   He does take Ambien 5 mg nightly for insomnia.  Feels as though he sleeps well with this medication  Has a history of BPH for which he takes Flomax 0.4 mg daily.  Feels as though this medication controls his symptoms well.  Review of Systems  See HPI   Past Medical History:  Diagnosis Date   Allergy    Apnea, sleep 07/28/07   NPSG [place], AHI 79.9   Carotid artery stenosis    mild 04/2010, 05/2012   Coronary heart disease 2008   Cath 2008, 60-70% proximal diagonal 1 stenosis,  RCA 40% stenosis.  nonischemic Lexiscan 03/2011   Diabetes mellitus    type II   GERD (gastroesophageal reflux disease)    Hyperlipidemia    Hypertension    Hypospadias     Social  History   Socioeconomic History   Marital status: Married    Spouse name: Not on file   Number of children: 3   Years of education: Not on file   Highest education level: Not on file  Occupational History   Occupation: Retired Games developer: RETIRED  Tobacco Use   Smoking status: Never   Smokeless tobacco: Never  Substance and Sexual Activity   Alcohol use: No    Alcohol/week: 0.0 standard drinks   Drug use: No   Sexual activity: Not on file  Other Topics Concern   Not on file  Social History Narrative   Married.  Three children from first marriage.  First wife died of ovarian cancer.     Retired, did work for [employer] for 44 years.    Social Determinants of Health   Financial Resource Strain: Low Risk    Difficulty of Paying Living Expenses: Not hard at all  Food Insecurity: No Food Insecurity   Worried About Charity fundraiser in the Last Year: Never true   [place] in the Last Year: Never true  Transportation Needs: No Transportation Needs   Lack of Transportation (Medical): No   Lack of Transportation (Non-Medical): No  Physical Activity: Unknown   Days of Exercise per Week: Not on file   Minutes of Exercise per  Session: 30 min  Stress: No Stress Concern Present   Feeling of Stress : Not at all  Social Connections: Socially Integrated   Frequency of Communication with Friends and Family: More than three times a week   Frequency of Social Gatherings with Friends and Family: More than three times a week   Attends Religious Services: More than 4 times per year   Active Member of [employer] or Organizations: Yes   Attends Music therapist: More than 4 times per year   Marital Status: Married  Human resources officer Violence: Not At Risk   Fear of Current or Ex-Partner: No   Emotionally Abused: No   Physically Abused: No   Sexually Abused: No    Past Surgical History:  Procedure Laterality Date   EYE SURGERY     NASAL SINUS SURGERY      NISSEN FUNDOPLICATION     TONSILLECTOMY     VEIN LIGATION AND STRIPPING      Family History  Problem Relation Age of Onset   Coronary artery disease Father 74       CABG   Dementia Mother     Allergies  Allergen Reactions   Iohexol      Desc: pt. states severe reaction, dr. told patient never to have the dye again     Current Outpatient Medications on File Prior to Visit  Medication Sig Dispense Refill   aspirin 81 MG tablet Take 1 tablet (81 mg total) by mouth daily. 90 tablet 3   Cholecalciferol (VITAMIN D3 PO) Take 10,000 Units by mouth once a week.     clindamycin (CLEOCIN T) 1 % external solution Apply topically.     Coenzyme Q10 (CO Q10) 100 MG CAPS Take 1 tablet by mouth daily.     fexofenadine (ALLEGRA) 180 MG tablet Take 1 tablet (180 mg total) by mouth daily. 90 tablet 3   fluticasone (FLONASE) 50 MCG/ACT nasal spray USE 2 SPRAYS IN EACH NOSTRIL DAILY 48 g 3   gabapentin (NEURONTIN) 300 MG capsule TAKE 1 TO 2 CAPSULES IN THE EVENING 180 capsule 3   GLUCOSAMINE-CHONDROITIN PO Take by mouth. 1500 mg glucosamine and 1200 mg of Chondroitin TWICE DAILY     hydrocortisone 2.5 % cream Apply topically 2 (two) times daily. 30 g 3   Lancets (ONETOUCH ULTRASOFT) lancets USE TO TEST BLOOD GLUCOSE TWICE DAILY 200 each 3   olmesartan (BENICAR) 20 MG tablet Take 1 tablet (20 mg total) by mouth daily. 90 tablet 3   Omega-3 Fatty Acids (FISH OIL ULTRA) 1400 MG CAPS Take by mouth.     traMADol (ULTRAM) 50 MG tablet TAKE 1 TABLET EVERY 8 HOURS AS NEEDED 90 tablet 2   zolpidem (AMBIEN) 5 MG tablet TAKE 1 TABLET AT BEDTIME AS NEEDED FOR SLEEP 90 tablet 0   nitroGLYCERIN (NITROSTAT) 0.4 MG SL tablet Place 1 tablet (0.4 mg total) under the tongue every 5 (five) minutes as needed for chest pain. 90 tablet 3   rosuvastatin (CRESTOR) 40 MG tablet Take 1 tablet (40 mg total) by mouth daily. 90 tablet 3   No current facility-administered medications on file prior to visit.    BP 140/70     Pulse 64    Temp (!) 97 F (36.1 C) (Oral)    Ht 5\' 7"  (1.702 m)    Wt 179 lb (81.2 kg)    SpO2 96%    BMI 28.04 kg/m       Objective:   Physical Exam  Vitals and nursing note reviewed.  Constitutional:      Appearance: Normal appearance.  Cardiovascular:     Rate and Rhythm: Normal rate and regular rhythm.     Pulses: Normal pulses.     Heart sounds: Normal heart sounds.  Pulmonary:     Effort: Pulmonary effort is normal.     Breath sounds: Normal breath sounds.  Musculoskeletal:        General: Normal range of motion.  Skin:    General: Skin is warm and dry.  Neurological:     General: No focal deficit present.     Mental Status: He is alert and oriented to person, place, and time.  Psychiatric:        Mood and Affect: Mood normal.        Behavior: Behavior normal.        Thought Content: Thought content normal.      Assessment & Plan:  1. Type 2 diabetes mellitus with complication, without long-term current use of insulin (HCC) - metFORMIN (GLUCOPHAGE) 500 MG tablet; Take 2 tablets with AM meal and 1 tablet with PM meal  Dispense: 270 tablet; Refill: 1 - CBC with Differential/Platelet; Future - Comprehensive metabolic panel; Future - Hemoglobin A1c; Future - Hemoglobin A1c - Comprehensive metabolic panel - CBC with Differential/Platelet  2. Diabetic polyneuropathy associated with type 2 diabetes mellitus (HCC) - Continue gabapentin  - CBC with Differential/Platelet; Future - Comprehensive metabolic panel; Future - Hemoglobin A1c; Future - Hemoglobin A1c - Comprehensive metabolic panel - CBC with Differential/Platelet  3. Mixed hyperlipidemia - Continue statin  - CBC with Differential/Platelet; Future - Comprehensive metabolic panel; Future - Hemoglobin A1c; Future - Hemoglobin A1c - Comprehensive metabolic panel - CBC with Differential/Platelet  4. Benign prostatic hyperplasia with lower urinary tract symptoms, symptom details unspecified  - tamsulosin  (FLOMAX) 0.4 MG CAPS capsule; Take 1 capsule (0.4 mg total) by mouth daily.  Dispense: 90 capsule; Refill: 1  5. Essential hypertension - Well controlled.  - No change in medications   6. Chronic insomnia - Continue with Ambien 5 mg   Dorothyann Peng, NP

## 2022-01-06 NOTE — Patient Instructions (Signed)
Health Maintenance Due  Topic Date Due   FOOT EXAM  12/03/2017   COVID-19 Vaccine (4 - Booster for Pfizer series) 10/04/2020   OPHTHALMOLOGY EXAM  10/16/2021    Depression screen PHQ 2/9 12/26/2021 10/15/2020 01/28/2018  Decreased Interest 0 0 0  Down, Depressed, Hopeless 0 0 0  PHQ - 2 Score 0 0 0  Some recent data might be hidden

## 2022-01-07 LAB — COMPREHENSIVE METABOLIC PANEL
ALT: 18 U/L (ref 0–53)
AST: 24 U/L (ref 0–37)
Albumin: 4.5 g/dL (ref 3.5–5.2)
Alkaline Phosphatase: 40 U/L (ref 39–117)
BUN: 25 mg/dL — ABNORMAL HIGH (ref 6–23)
CO2: 24 mEq/L (ref 19–32)
Calcium: 9.6 mg/dL (ref 8.4–10.5)
Chloride: 104 mEq/L (ref 96–112)
Creatinine, Ser: 1.45 mg/dL (ref 0.40–1.50)
GFR: 44.84 mL/min — ABNORMAL LOW (ref >60.00)
Glucose, Bld: 112 mg/dL — ABNORMAL HIGH (ref 70–99)
Potassium: 4.2 mEq/L (ref 3.5–5.1)
Sodium: 137 mEq/L (ref 135–145)
Total Bilirubin: 0.8 mg/dL (ref 0.2–1.2)
Total Protein: 7.3 g/dL (ref 6.0–8.3)

## 2022-01-07 LAB — CBC WITH DIFFERENTIAL/PLATELET
Basophils Absolute: 0.1 10*3/uL (ref 0.0–0.1)
Basophils Relative: 1.1 % (ref 0.0–3.0)
Eosinophils Absolute: 0.3 10*3/uL (ref 0.0–0.7)
Eosinophils Relative: 3.7 % (ref 0.0–5.0)
HCT: 39.4 % (ref 39.0–52.0)
Hemoglobin: 13.1 g/dL (ref 13.0–17.0)
Lymphocytes Relative: 20.7 % (ref 12.0–46.0)
Lymphs Abs: 1.6 10*3/uL (ref 0.7–4.0)
MCHC: 33.2 g/dL (ref 30.0–36.0)
MCV: 88.7 fl (ref 78.0–100.0)
Monocytes Absolute: 0.5 10*3/uL (ref 0.1–1.0)
Monocytes Relative: 6.3 % (ref 3.0–12.0)
Neutro Abs: 5.2 10*3/uL (ref 1.4–7.7)
Neutrophils Relative %: 68.2 % (ref 43.0–77.0)
Platelets: 197 10*3/uL (ref 150.0–400.0)
RBC: 4.45 Mil/uL (ref 4.22–5.81)
RDW: 13.5 % (ref 11.5–15.5)
WBC: 7.7 10*3/uL (ref 4.0–10.5)

## 2022-01-07 LAB — HEMOGLOBIN A1C: Hgb A1c MFr Bld: 6.9 % — ABNORMAL HIGH (ref 4.6–6.5)

## 2022-02-14 ENCOUNTER — Other Ambulatory Visit: Payer: Self-pay | Admitting: Adult Health

## 2022-02-15 ENCOUNTER — Other Ambulatory Visit: Payer: Self-pay | Admitting: Cardiology

## 2022-02-18 NOTE — Telephone Encounter (Signed)
Save this for [facility] to address  ?

## 2022-02-18 NOTE — Telephone Encounter (Signed)
Last refill per controlled substance database: 11/13/21 ?Last OV: 01/06/22 ?Next OV: none scheduled. ?

## 2022-03-10 DIAGNOSIS — L738 Other specified follicular disorders: Secondary | ICD-10-CM | POA: Diagnosis not present

## 2022-03-10 DIAGNOSIS — L309 Dermatitis, unspecified: Secondary | ICD-10-CM | POA: Diagnosis not present

## 2022-03-11 ENCOUNTER — Encounter: Payer: Self-pay | Admitting: Adult Health

## 2022-03-11 DIAGNOSIS — N1831 Chronic kidney disease, stage 3a: Secondary | ICD-10-CM | POA: Diagnosis not present

## 2022-03-11 DIAGNOSIS — N2581 Secondary hyperparathyroidism of renal origin: Secondary | ICD-10-CM | POA: Diagnosis not present

## 2022-03-11 DIAGNOSIS — E785 Hyperlipidemia, unspecified: Secondary | ICD-10-CM | POA: Diagnosis not present

## 2022-03-11 DIAGNOSIS — E1122 Type 2 diabetes mellitus with diabetic chronic kidney disease: Secondary | ICD-10-CM | POA: Diagnosis not present

## 2022-03-11 DIAGNOSIS — I129 Hypertensive chronic kidney disease with stage 1 through stage 4 chronic kidney disease, or unspecified chronic kidney disease: Secondary | ICD-10-CM | POA: Diagnosis not present

## 2022-03-12 ENCOUNTER — Encounter: Payer: Self-pay | Admitting: Adult Health

## 2022-03-12 ENCOUNTER — Other Ambulatory Visit: Payer: Self-pay | Admitting: Adult Health

## 2022-03-12 ENCOUNTER — Telehealth (INDEPENDENT_AMBULATORY_CARE_PROVIDER_SITE_OTHER): Payer: Medicare Other | Admitting: Adult Health

## 2022-03-12 VITALS — Ht 67.0 in | Wt 179.0 lb

## 2022-03-12 DIAGNOSIS — N182 Chronic kidney disease, stage 2 (mild): Secondary | ICD-10-CM | POA: Diagnosis not present

## 2022-03-12 DIAGNOSIS — E118 Type 2 diabetes mellitus with unspecified complications: Secondary | ICD-10-CM | POA: Diagnosis not present

## 2022-03-12 NOTE — Progress Notes (Signed)
Virtual Visit via Video Note ? ?I connected with Ian [name] on 03/12/22 at  4:00 PM EDT by a video enabled telemedicine application and verified that I am speaking with the correct person using two identifiers. ? Location patient: home ?Location provider:work or home office ?Persons participating in the virtual visit: patient, provider ? ?I discussed the limitations of evaluation and management by telemedicine and the availability of in person appointments. The patient expressed understanding and agreed to proceed. ? ? ?HPI: ?81 year old male who is being evaluated for diabetes mellitus and chronic kidney disease.  Was seen by his nephrologist, Dr. Hollie [name] yesterday and she suggested that he talk to his PCP about changing him from metformin to [place], [place], or Rybelsus. ? ?His CKD has been stable throughout the years while taking metformin.  His A1c has been well controlled while being on metformin as well. ? ? ?ROS: See pertinent positives and negatives per HPI. ? ?Past Medical History:  ?Diagnosis Date  ? Allergy   ? Apnea, sleep 07/28/07  ? NPSG [place], AHI 79.9  ? Carotid artery stenosis   ? mild 04/2010, 05/2012  ? Coronary heart disease 2008  ? Cath 2008, 60-70% proximal diagonal 1 stenosis,  RCA 40% stenosis.  nonischemic Lexiscan 03/2011  ? Diabetes mellitus   ? type II  ? GERD (gastroesophageal reflux disease)   ? Hyperlipidemia   ? Hypertension   ? Hypospadias   ? ? ?Past Surgical History:  ?Procedure Laterality Date  ? EYE SURGERY    ? NASAL SINUS SURGERY    ? NISSEN FUNDOPLICATION    ? TONSILLECTOMY    ? VEIN LIGATION AND STRIPPING    ? ? ?Family History  ?Problem Relation Age of Onset  ? Coronary artery disease Father 20  ?     CABG  ? Dementia Mother   ? ? ? ? ? ?Current Outpatient Medications:  ?  aspirin 81 MG tablet, Take 1 tablet (81 mg total) by mouth daily., Disp: 90 tablet, Rfl: 3 ?  augmented betamethasone dipropionate (DIPROLENE-AF) 0.05 % ointment, Apply topically., Disp: , Rfl:  ?   Cholecalciferol (VITAMIN D3 PO), Take 10,000 Units by mouth once a week., Disp: , Rfl:  ?  clindamycin (CLEOCIN T) 1 % external solution, Apply topically., Disp: , Rfl:  ?  Coenzyme Q10 (CO Q10) 100 MG CAPS, Take 1 tablet by mouth daily., Disp: , Rfl:  ?  econazole nitrate 1 % cream, Apply topically daily., Disp: 15 g, Rfl: 2 ?  fexofenadine (ALLEGRA) 180 MG tablet, Take 1 tablet (180 mg total) by mouth daily., Disp: 90 tablet, Rfl: 3 ?  fluticasone (FLONASE) 50 MCG/ACT nasal spray, USE 2 SPRAYS IN EACH NOSTRIL DAILY, Disp: 48 g, Rfl: 3 ?  gabapentin (NEURONTIN) 300 MG capsule, TAKE 1 TO 2 CAPSULES IN THE EVENING, Disp: 180 capsule, Rfl: 3 ?  GLUCOSAMINE-CHONDROITIN PO, Take by mouth. 1500 mg glucosamine and 1200 mg of Chondroitin TWICE DAILY, Disp: , Rfl:  ?  hydrocortisone 2.5 % cream, Apply topically 2 (two) times daily., Disp: 30 g, Rfl: 3 ?  Lancets (ONETOUCH ULTRASOFT) lancets, USE TO TEST BLOOD GLUCOSE TWICE DAILY, Disp: 200 each, Rfl: 3 ?  metFORMIN (GLUCOPHAGE) 500 MG tablet, Take 2 tablets with AM meal and 1 tablet with PM meal, Disp: 270 tablet, Rfl: 1 ?  nitroGLYCERIN (NITROSTAT) 0.4 MG SL tablet, DISSOLVE 1 TABLET UNDER THE TONGUE EVERY 5 MINUTES AS NEEDED FOR CHEST PAIN, Disp: 75 tablet, Rfl: 14 ?  olmesartan (BENICAR)  20 MG tablet, Take 1 tablet (20 mg total) by mouth daily., Disp: 90 tablet, Rfl: 3 ?  Omega-3 Fatty Acids (FISH OIL ULTRA) 1400 MG CAPS, Take by mouth., Disp: , Rfl:  ?  sildenafil (VIAGRA) 25 MG tablet, Take 1 tablet (25 mg total) by mouth daily as needed for erectile dysfunction., Disp: 20 tablet, Rfl: 3 ?  SYSTANE ULTRA 0.4-0.3 % SOLN, SMARTSIG:1 Drop(s) In Eye(s) PRN, Disp: , Rfl:  ?  tamsulosin (FLOMAX) 0.4 MG CAPS capsule, Take 1 capsule (0.4 mg total) by mouth daily., Disp: 90 capsule, Rfl: 1 ?  traMADol (ULTRAM) 50 MG tablet, TAKE 1 TABLET EVERY 8 HOURS AS NEEDED, Disp: 90 tablet, Rfl: 2 ?  zolpidem (AMBIEN) 5 MG tablet, TAKE 1 TABLET AT BEDTIME AS NEEDED FOR SLEEP, Disp: 90  tablet, Rfl: 0 ?  rosuvastatin (CRESTOR) 40 MG tablet, Take 1 tablet (40 mg total) by mouth daily., Disp: 90 tablet, Rfl: 3 ? ?EXAM: ? ?VITALS per patient if applicable: ? ?GENERAL: alert, oriented, appears well and in no acute distress ? ?HEENT: atraumatic, conjunttiva clear, no obvious abnormalities on inspection of external nose and ears ? ?NECK: normal movements of the head and neck ? ?LUNGS: on inspection no signs of respiratory distress, breathing rate appears normal, no obvious gross SOB, gasping or wheezing ? ?CV: no obvious cyanosis ? ?MS: moves all visible extremities without noticeable abnormality ? ?PSYCH/NEURO: pleasant and cooperative, no obvious depression or anxiety, speech and thought processing grossly intact ? ?ASSESSMENT AND PLAN: ? ?Discussed the following assessment and plan: ? ?1. Type 2 diabetes mellitus with complication, without long-term current use of insulin ([name]) ?-I have not received notes from his nephrologist just yet.  We discussed the medications that she recommended.  I do not recommend Rybelsus as I have never had good results with this due to side effects.  He would be better served on an SGL T2 inhibitor such as [name] or [name].  We will wait for her office notes to come through and then reach out to the patient. ? ?2. CKD (chronic kidney disease), stage II ? ? ? ? ?  ?I discussed the assessment and treatment plan with the patient. The patient was provided an opportunity to ask questions and all were answered. The patient agreed with the plan and demonstrated an understanding of the instructions. ?  ?The patient was advised to call back or seek an in-person evaluation if the symptoms worsen or if the condition fails to improve as anticipated. ? ? ?Dorothyann Peng, NP  ? ?

## 2022-03-12 NOTE — Telephone Encounter (Signed)
Pt has been scheduled.  °

## 2022-03-12 NOTE — Progress Notes (Unsigned)
Virtual Visit via Telephone Note ? ?I connected with Ian [name] on 03/12/22 at  by telephone and verified that I am speaking with the correct person using two identifiers. ?  ?I discussed the limitations, risks, security and privacy concerns of performing an evaluation and management service by telephone and the availability of in person appointments. I also discussed with the patient that there may be a patient responsible charge related to this service. The patient expressed understanding and agreed to proceed. ? ?Location patient: home ?Location provider: work or home office ?Participants present for the call: patient, provider ?Patient did not have a visit in the prior 7 days to address this/these issue(s). ? ? ?History of Present Illness: ? ?  ?Observations/Objective: ?Patient sounds cheerful and well on the phone. ?I do not appreciate any SOB. ?Speech and thought processing are grossly intact. ?Patient reported vitals: ? ?Assessment and Plan: ? ? ?Follow Up Instructions: ? ?DIAGMED@ ? ? ?67209 5-10 ?47096 11-20 ?9443 21-30 ?I did not refer this patient for an OV in the next 24 hours for this/these issue(s). ? ?I discussed the assessment and treatment plan with the patient. The patient was provided an opportunity to ask questions and all were answered. The patient agreed with the plan and demonstrated an understanding of the instructions. ?  ?The patient was advised to call back or seek an in-person evaluation if the symptoms worsen or if the condition fails to improve as anticipated. ? ?I provided *** minutes of non-face-to-face time during this encounter. ? ? ?Dorothyann Peng, NP  ? ?

## 2022-03-13 ENCOUNTER — Encounter: Payer: Self-pay | Admitting: Adult Health

## 2022-03-13 NOTE — Telephone Encounter (Signed)
FYI

## 2022-03-17 ENCOUNTER — Encounter: Payer: Self-pay | Admitting: Adult Health

## 2022-03-17 ENCOUNTER — Ambulatory Visit (INDEPENDENT_AMBULATORY_CARE_PROVIDER_SITE_OTHER): Payer: Medicare Other | Admitting: Adult Health

## 2022-03-17 VITALS — BP 120/60 | Temp 98.4°F | Ht 67.0 in | Wt 179.0 lb

## 2022-03-17 DIAGNOSIS — N182 Chronic kidney disease, stage 2 (mild): Secondary | ICD-10-CM

## 2022-03-17 DIAGNOSIS — I2510 Atherosclerotic heart disease of native coronary artery without angina pectoris: Secondary | ICD-10-CM

## 2022-03-17 DIAGNOSIS — N401 Enlarged prostate with lower urinary tract symptoms: Secondary | ICD-10-CM

## 2022-03-17 DIAGNOSIS — E1142 Type 2 diabetes mellitus with diabetic polyneuropathy: Secondary | ICD-10-CM

## 2022-03-17 DIAGNOSIS — E782 Mixed hyperlipidemia: Secondary | ICD-10-CM | POA: Diagnosis not present

## 2022-03-17 DIAGNOSIS — E1122 Type 2 diabetes mellitus with diabetic chronic kidney disease: Secondary | ICD-10-CM | POA: Diagnosis not present

## 2022-03-17 DIAGNOSIS — I10 Essential (primary) hypertension: Secondary | ICD-10-CM

## 2022-03-17 DIAGNOSIS — E118 Type 2 diabetes mellitus with unspecified complications: Secondary | ICD-10-CM

## 2022-03-17 DIAGNOSIS — F5104 Psychophysiologic insomnia: Secondary | ICD-10-CM | POA: Diagnosis not present

## 2022-03-18 LAB — CBC WITH DIFFERENTIAL/PLATELET
Basophils Absolute: 0.2 10*3/uL — ABNORMAL HIGH (ref 0.0–0.1)
Basophils Relative: 2.2 % (ref 0.0–3.0)
Eosinophils Absolute: 0.3 10*3/uL (ref 0.0–0.7)
Eosinophils Relative: 4.1 % (ref 0.0–5.0)
HCT: 40 % (ref 39.0–52.0)
Hemoglobin: 13.3 g/dL (ref 13.0–17.0)
Lymphocytes Relative: 24 % (ref 12.0–46.0)
Lymphs Abs: 1.8 10*3/uL (ref 0.7–4.0)
MCHC: 33.3 g/dL (ref 30.0–36.0)
MCV: 89 fl (ref 78.0–100.0)
Monocytes Absolute: 0.6 10*3/uL (ref 0.1–1.0)
Monocytes Relative: 7.6 % (ref 3.0–12.0)
Neutro Abs: 4.6 10*3/uL (ref 1.4–7.7)
Neutrophils Relative %: 62.1 % (ref 43.0–77.0)
Platelets: 195 10*3/uL (ref 150.0–400.0)
RBC: 4.49 Mil/uL (ref 4.22–5.81)
RDW: 13.4 % (ref 11.5–15.5)
WBC: 7.4 10*3/uL (ref 4.0–10.5)

## 2022-03-18 LAB — COMPREHENSIVE METABOLIC PANEL
ALT: 18 U/L (ref 0–53)
AST: 25 U/L (ref 0–37)
Albumin: 4.7 g/dL (ref 3.5–5.2)
Alkaline Phosphatase: 45 U/L (ref 39–117)
BUN: 34 mg/dL — ABNORMAL HIGH (ref 6–23)
CO2: 25 mEq/L (ref 19–32)
Calcium: 9.7 mg/dL (ref 8.4–10.5)
Chloride: 100 mEq/L (ref 96–112)
Creatinine, Ser: 1.69 mg/dL — ABNORMAL HIGH (ref 0.40–1.50)
GFR: 37.26 mL/min — ABNORMAL LOW (ref >60.00)
Glucose, Bld: 110 mg/dL — ABNORMAL HIGH (ref 70–99)
Potassium: 4.9 mEq/L (ref 3.5–5.1)
Sodium: 135 mEq/L (ref 135–145)
Total Bilirubin: 0.9 mg/dL (ref 0.2–1.2)
Total Protein: 7.4 g/dL (ref 6.0–8.3)

## 2022-03-18 LAB — PSA: PSA: 1.11 ng/mL (ref 0.10–4.00)

## 2022-03-18 LAB — LIPID PANEL
Cholesterol: 146 mg/dL (ref 0–200)
HDL: 50.8 mg/dL (ref >39.00)
LDL Cholesterol: 77 mg/dL (ref 0–99)
NonHDL: 94.99
Total CHOL/HDL Ratio: 3
Triglycerides: 91 mg/dL (ref 0.0–149.0)
VLDL: 18.2 mg/dL (ref 0.0–40.0)

## 2022-03-18 LAB — TSH: TSH: 0.92 u[IU]/mL (ref 0.35–5.50)

## 2022-03-18 LAB — HEMOGLOBIN A1C: Hgb A1c MFr Bld: 6.6 % — ABNORMAL HIGH (ref 4.6–6.5)

## 2022-03-18 NOTE — Progress Notes (Signed)
? ?Subjective:  ? ? Patient ID: Ian [name], male    DOB: Sep 03, 1941, 81 y.o.   MRN: [id number] ? ?HPI ?81 year old male who  has a past medical history of Allergy, Apnea, sleep (07/28/07), Carotid artery stenosis, Coronary heart disease (2008), Diabetes mellitus, GERD (gastroesophageal reflux disease), Hyperlipidemia, Hypertension, and Hypospadias. ? ?He presents to the office today for follow-up regarding his chronic disease management.  He is getting ready to leave for the summer to go to his home in [place].  He wants to make sure his health is on track blood work done. ? ?He has a history of diabetes mellitus type 2 which is currently managed with metformin 1000 mg in the morning and 500 mg in the evening.  He has not had any episodes of hypoglycemia.  He does check his blood sugars periodically with readings in the 120s to 140s.  His nephrologist is going to send in [place] 10 mg which will replace metformin to help preserve kidney function.  He does have diabetic neuropathy for which she takes gabapentin 300 to 600 mg nightly. ?Lab Results  ?Component Value Date  ? HGBA1C 6.9 (H) 01/06/2022  ? ?Due to history of hypertension he takes Benicar 20 mg daily.  His blood pressures have been well controlled ? ?BP Readings from Last 3 Encounters:  ?03/17/22 120/60  ?01/06/22 140/70  ?10/14/21 130/68  ? ?Additionally he has a history of hyperlipidemia/CAD and his seen by cardiology in [place] as well as [place].  He takes Crestor 40 mg daily and ASA 81 mg daily.   Denies myalgia or fatigue with this medication ?Lab Results  ?Component Value Date  ? CHOL 132 09/17/2021  ? HDL 41.90 09/17/2021  ? [place] 64 09/17/2021  ? TRIG 133.0 09/17/2021  ? CHOLHDL 3 09/17/2021  ? ?For insomnia you have been well controlled on Ambien 5 mg nightly ? ?BPH he takes Flomax 0.4 mg daily and this dose his symptoms are well controlled. ? ?He continues to eat a heart rate is active with walking.  He has not had any new shortness  of breath, orthopnea, chest pain, palpitations, or syncopal episodes. ? ? ?Review of Systems ?See HPI  ? ?Past Medical History:  ?Diagnosis Date  ? Allergy   ? Apnea, sleep 07/28/07  ? NPSG [place], AHI 79.9  ? Carotid artery stenosis   ? mild 04/2010, 05/2012  ? Coronary heart disease 2008  ? Cath 2008, 60-70% proximal diagonal 1 stenosis,  RCA 40% stenosis.  nonischemic Lexiscan 03/2011  ? Diabetes mellitus   ? type II  ? GERD (gastroesophageal reflux disease)   ? Hyperlipidemia   ? Hypertension   ? Hypospadias   ? ? ?Social History  ? ?Socioeconomic History  ? Marital status: Married  ?  Spouse name: Not on file  ? Number of children: 3  ? Years of education: Not on file  ? Highest education level: Not on file  ?Occupational History  ? Occupation: Retired Solicitor  ?  Employer: RETIRED  ?Tobacco Use  ? Smoking status: Never  ? Smokeless tobacco: Never  ?Substance and Sexual Activity  ? Alcohol use: No  ?  Alcohol/week: 0.0 standard drinks  ? Drug use: No  ? Sexual activity: Not on file  ?Other Topics Concern  ? Not on file  ?Social History Narrative  ? Married.  Three children from first marriage.  First wife died of ovarian cancer.    ? Retired, did work for  Solicitor for 44 years.   ? ?Social Determinants of Health  ? ?Financial Resource Strain: Low Risk   ? Difficulty of Paying Living Expenses: Not hard at all  ?Food Insecurity: No Food Insecurity  ? Worried About Charity fundraiser in the Last Year: Never true  ? Ran Out of Food in the Last Year: Never true  ?Transportation Needs: No Transportation Needs  ? Lack of Transportation (Medical): No  ? Lack of Transportation (Non-Medical): No  ?Physical Activity: Unknown  ? Days of Exercise per Week: Not on file  ? Minutes of Exercise per Session: 30 min  ?Stress: No Stress Concern Present  ? Feeling of Stress : Not at all  ?Social Connections: Socially Integrated  ? Frequency of Communication with Friends and Family: More than three times a week  ?  Frequency of Social Gatherings with Friends and Family: More than three times a week  ? Attends Religious Services: More than 4 times per year  ? Active Member of Clubs or Organizations: Yes  ? Attends Archivist Meetings: More than 4 times per year  ? Marital Status: Married  ?Intimate Partner Violence: Not At Risk  ? Fear of Current or Ex-Partner: No  ? Emotionally Abused: No  ? Physically Abused: No  ? Sexually Abused: No  ? ? ?Past Surgical History:  ?Procedure Laterality Date  ? EYE SURGERY    ? NASAL SINUS SURGERY    ? NISSEN FUNDOPLICATION    ? TONSILLECTOMY    ? VEIN LIGATION AND STRIPPING    ? ? ?Family History  ?Problem Relation Age of Onset  ? Coronary artery disease Father 19  ?     CABG  ? Dementia Mother   ? ? ?Allergies  ?Allergen Reactions  ? Iohexol   ?   Desc: pt. states severe reaction, dr. told patient never to have the dye again ?  ? ? ?Current Outpatient Medications on File Prior to Visit  ?Medication Sig Dispense Refill  ? aspirin 81 MG tablet Take 1 tablet (81 mg total) by mouth daily. 90 tablet 3  ? augmented betamethasone dipropionate (DIPROLENE-AF) 0.05 % ointment Apply topically.    ? Cholecalciferol (VITAMIN D3 PO) Take 10,000 Units by mouth once a week.    ? clindamycin (CLEOCIN T) 1 % external solution Apply topically.    ? Coenzyme Q10 (CO Q10) 100 MG CAPS Take 1 tablet by mouth daily.    ? econazole nitrate 1 % cream Apply topically daily. 15 g 2  ? fexofenadine (ALLEGRA) 180 MG tablet Take 1 tablet (180 mg total) by mouth daily. 90 tablet 3  ? fluticasone (FLONASE) 50 MCG/ACT nasal spray USE 2 SPRAYS IN EACH NOSTRIL DAILY 48 g 3  ? gabapentin (NEURONTIN) 300 MG capsule TAKE 1 TO 2 CAPSULES IN THE EVENING 180 capsule 3  ? GLUCOSAMINE-CHONDROITIN PO Take by mouth. 1500 mg glucosamine and 1200 mg of Chondroitin TWICE DAILY    ? hydrocortisone 2.5 % cream Apply topically 2 (two) times daily. 30 g 3  ? Lancets (ONETOUCH ULTRASOFT) lancets USE TO TEST BLOOD GLUCOSE TWICE DAILY  200 each 3  ? metFORMIN (GLUCOPHAGE) 500 MG tablet Take 2 tablets with AM meal and 1 tablet with PM meal 270 tablet 1  ? nitroGLYCERIN (NITROSTAT) 0.4 MG SL tablet DISSOLVE 1 TABLET UNDER THE TONGUE EVERY 5 MINUTES AS NEEDED FOR CHEST PAIN 75 tablet 14  ? olmesartan (BENICAR) 20 MG tablet Take 1 tablet (20 mg total)  by mouth daily. 90 tablet 3  ? Omega-3 Fatty Acids (FISH OIL ULTRA) 1400 MG CAPS Take by mouth.    ? sildenafil (VIAGRA) 25 MG tablet Take 1 tablet (25 mg total) by mouth daily as needed for erectile dysfunction. 20 tablet 3  ? SYSTANE ULTRA 0.4-0.3 % SOLN SMARTSIG:1 Drop(s) In Eye(s) PRN    ? tamsulosin (FLOMAX) 0.4 MG CAPS capsule Take 1 capsule (0.4 mg total) by mouth daily. 90 capsule 1  ? traMADol (ULTRAM) 50 MG tablet TAKE 1 TABLET EVERY 8 HOURS AS NEEDED 90 tablet 2  ? zolpidem (AMBIEN) 5 MG tablet TAKE 1 TABLET AT BEDTIME AS NEEDED FOR SLEEP 90 tablet 0  ? rosuvastatin (CRESTOR) 40 MG tablet Take 1 tablet (40 mg total) by mouth daily. 90 tablet 3  ? ?No current facility-administered medications on file prior to visit.  ? ? ?BP 120/60   Temp 98.4 ?F (36.9 ?C) (Oral)   Ht '5\' 7"'$  (1.702 m)   Wt 179 lb (81.2 kg)   BMI 28.04 kg/m?  ? ? ?   ?Objective:  ? Physical Exam ?Vitals and nursing note reviewed.  ?Constitutional:   ?   Appearance: Normal appearance.  ?Cardiovascular:  ?   Rate and Rhythm: Normal rate and regular rhythm.  ?   Pulses: Normal pulses.  ?   Heart sounds: Normal heart sounds.  ?Pulmonary:  ?   Effort: Pulmonary effort is normal.  ?   Breath sounds: Normal breath sounds.  ?Abdominal:  ?   General: Abdomen is flat. Bowel sounds are normal.  ?   Palpations: Abdomen is soft.  ?Musculoskeletal:     ?   General: Normal range of motion.  ?Skin: ?   General: Skin is warm and dry.  ?Neurological:  ?   General: No focal deficit present.  ?   Mental Status: He is alert and oriented to person, place, and time.  ?Psychiatric:     ?   Mood and Affect: Mood normal.     ?   Behavior: Behavior  normal.     ?   Thought Content: Thought content normal.  ? ? ?   ?Assessment & Plan:  ?1. Type 2 diabetes mellitus with complication, without long-term current use of insulin ([place]) ?- Switch to [name]. He has a f

## 2022-03-26 DIAGNOSIS — Z961 Presence of intraocular lens: Secondary | ICD-10-CM | POA: Diagnosis not present

## 2022-03-26 DIAGNOSIS — H40013 Open angle with borderline findings, low risk, bilateral: Secondary | ICD-10-CM | POA: Diagnosis not present

## 2022-03-26 LAB — HM DIABETES EYE EXAM

## 2022-03-31 DIAGNOSIS — N1831 Chronic kidney disease, stage 3a: Secondary | ICD-10-CM | POA: Diagnosis not present

## 2022-04-01 ENCOUNTER — Encounter: Payer: Self-pay | Admitting: Adult Health

## 2022-04-10 ENCOUNTER — Encounter: Payer: Self-pay | Admitting: Family Medicine

## 2022-04-10 ENCOUNTER — Ambulatory Visit (INDEPENDENT_AMBULATORY_CARE_PROVIDER_SITE_OTHER): Payer: Medicare Other | Admitting: Family Medicine

## 2022-04-10 VITALS — BP 118/60 | HR 55 | Temp 97.5°F | Ht 67.0 in | Wt 177.7 lb

## 2022-04-10 DIAGNOSIS — I2510 Atherosclerotic heart disease of native coronary artery without angina pectoris: Secondary | ICD-10-CM

## 2022-04-10 DIAGNOSIS — M79604 Pain in right leg: Secondary | ICD-10-CM

## 2022-04-10 NOTE — Progress Notes (Signed)
Established Patient Office Visit  Subjective   Patient ID: Ian [name], male    DOB: 1941-03-26  Age: 81 y.o. MRN: [id number]  Chief Complaint  Patient presents with   Knee Pain    Patient complains of posterior knee pain, x1 week, Patient denies any injury     HPI   Seen with right proximal calf pain for the past week or so.  Denies any specific injury.  He has some soreness and occasional sharp pain at rest and also with movement.  Has not noted any edema whatsoever.  No color changes.  No dyspnea.  No chest pains.  No pleuritic pain.  His main concern is he had a colleague years ago whose wife died of pulmonary embolus.  He denies any recent travel.  He is traveling out [place] this next Wednesday.  Non-smoker.  No hormonal therapy. No personal history of DVT or PE.  No family history.  Past Medical History:  Diagnosis Date   Allergy    Apnea, sleep 07/28/07   NPSG [place], AHI 79.9   Carotid artery stenosis    mild 04/2010, 05/2012   Coronary heart disease 2008   Cath 2008, 60-70% proximal diagonal 1 stenosis,  RCA 40% stenosis.  nonischemic Lexiscan 03/2011   Diabetes mellitus    type II   GERD (gastroesophageal reflux disease)    Hyperlipidemia    Hypertension    Hypospadias    Past Surgical History:  Procedure Laterality Date   EYE SURGERY     NASAL SINUS SURGERY     NISSEN FUNDOPLICATION     TONSILLECTOMY     VEIN LIGATION AND STRIPPING      reports that he has never smoked. He has never used smokeless tobacco. He reports that he does not drink alcohol and does not use drugs. family history includes Coronary artery disease (age of onset: 20) in his father; Dementia in his mother. Allergies  Allergen Reactions   Iohexol      Desc: pt. states severe reaction, dr. told patient never to have the dye again     Review of Systems  Constitutional:  Negative for fever.  Respiratory:  Negative for cough, hemoptysis and shortness of breath.   Cardiovascular:  Negative  for chest pain and leg swelling.     Objective:     BP 118/60 (BP Location: Left Arm, Patient Position: Sitting, Cuff Size: Normal)   Pulse (!) 55   Temp (!) 97.5 F (36.4 C) (Oral)   Ht '5\' 7"'$  (1.702 m)   Wt 177 lb 11.2 oz (80.6 kg)   SpO2 98%   BMI 27.83 kg/m    Physical Exam Vitals reviewed.  Constitutional:      Appearance: Normal appearance.  Cardiovascular:     Rate and Rhythm: Normal rate and regular rhythm.  Pulmonary:     Effort: Pulmonary effort is normal.     Breath sounds: Normal breath sounds.  Musculoskeletal:     Right lower leg: No edema.     Left lower leg: No edema.     Comments: No edema lower extremities.  No bruising.  Right knee reveals no effusion.  No erythema.  No warmth.  No pain with plantarflexion or dorsiflexion.  Minimal proximal right calf tenderness.  Neurological:     Mental Status: He is alert.     No results found for any visits on 04/10/22.    The ASCVD Risk score (Arnett DK, et al., 2019) failed to calculate  for the following reasons:   The 2019 ASCVD risk score is only valid for ages 32 to 28    Assessment & Plan:   Problem List Items Addressed This Visit   None Visit Diagnoses     Right leg pain    -  Primary   Relevant Orders   D-dimer, Quantitative     Patient presents with 1 week history of proximal right calf pain.  Suspect musculoskeletal.  Low clinical suspicion for DVT.  Other than age, no specific risk factors.  Has no edema whatsoever.  No extremity color changes.  -Check D-dimer.  We did discuss nonspecificity of D-dimer but if negative would feel comfortable observing for now. -Follow-up immediately for any increased edema, shortness of breath, chest pain, etc.  No follow-ups on file.    Carolann Littler, MD

## 2022-04-10 NOTE — Patient Instructions (Signed)
Follow up immediately for any increased right leg edema, chest pain, or shortness of breath.

## 2022-04-11 ENCOUNTER — Encounter: Payer: Self-pay | Admitting: Family Medicine

## 2022-04-11 LAB — D-DIMER, QUANTITATIVE: D-Dimer, Quant: 0.5 mcg/mL FEU — ABNORMAL HIGH (ref <0.50)

## 2022-04-21 ENCOUNTER — Other Ambulatory Visit: Payer: Self-pay | Admitting: Adult Health

## 2022-04-21 DIAGNOSIS — Z760 Encounter for issue of repeat prescription: Secondary | ICD-10-CM

## 2022-04-22 NOTE — Telephone Encounter (Signed)
Okay for refill?    LOV 03/12/2022   Last Refill       tamsulosin (FLOMAX) 0.4 MG CAPS capsule 90 capsule 1 01/06/2022   Sig:   Take 1 capsule (0.4 mg total) by mouth daily.

## 2022-05-15 ENCOUNTER — Other Ambulatory Visit: Payer: Self-pay | Admitting: Adult Health

## 2022-05-27 DIAGNOSIS — I2510 Atherosclerotic heart disease of native coronary artery without angina pectoris: Secondary | ICD-10-CM | POA: Diagnosis not present

## 2022-05-27 DIAGNOSIS — I739 Peripheral vascular disease, unspecified: Secondary | ICD-10-CM | POA: Diagnosis not present

## 2022-05-27 DIAGNOSIS — E782 Mixed hyperlipidemia: Secondary | ICD-10-CM | POA: Diagnosis not present

## 2022-05-27 DIAGNOSIS — I10 Essential (primary) hypertension: Secondary | ICD-10-CM | POA: Diagnosis not present

## 2022-05-27 DIAGNOSIS — I6523 Occlusion and stenosis of bilateral carotid arteries: Secondary | ICD-10-CM | POA: Diagnosis not present

## 2022-06-12 ENCOUNTER — Other Ambulatory Visit: Payer: Self-pay | Admitting: Adult Health

## 2022-06-12 DIAGNOSIS — N401 Enlarged prostate with lower urinary tract symptoms: Secondary | ICD-10-CM

## 2022-06-28 ENCOUNTER — Encounter: Payer: Self-pay | Admitting: Adult Health

## 2022-06-30 ENCOUNTER — Other Ambulatory Visit: Payer: Self-pay | Admitting: Adult Health

## 2022-06-30 DIAGNOSIS — B359 Dermatophytosis, unspecified: Secondary | ICD-10-CM

## 2022-06-30 MED ORDER — HYDROCORTISONE 2.5 % EX CREA: TOPICAL_CREAM | Freq: Two times a day (BID) | CUTANEOUS | 0 refills | Status: DC

## 2022-07-06 ENCOUNTER — Other Ambulatory Visit: Payer: Self-pay | Admitting: Adult Health

## 2022-07-06 DIAGNOSIS — Z760 Encounter for issue of repeat prescription: Secondary | ICD-10-CM

## 2022-07-21 ENCOUNTER — Encounter: Payer: Self-pay | Admitting: Adult Health

## 2022-07-21 DIAGNOSIS — I519 Heart disease, unspecified: Secondary | ICD-10-CM | POA: Diagnosis not present

## 2022-07-21 DIAGNOSIS — I10 Essential (primary) hypertension: Secondary | ICD-10-CM | POA: Diagnosis not present

## 2022-07-21 DIAGNOSIS — E119 Type 2 diabetes mellitus without complications: Secondary | ICD-10-CM | POA: Diagnosis not present

## 2022-07-21 DIAGNOSIS — N183 Chronic kidney disease, stage 3 unspecified: Secondary | ICD-10-CM | POA: Diagnosis not present

## 2022-07-21 DIAGNOSIS — I129 Hypertensive chronic kidney disease with stage 1 through stage 4 chronic kidney disease, or unspecified chronic kidney disease: Secondary | ICD-10-CM | POA: Diagnosis not present

## 2022-07-21 DIAGNOSIS — E1122 Type 2 diabetes mellitus with diabetic chronic kidney disease: Secondary | ICD-10-CM | POA: Diagnosis not present

## 2022-07-22 ENCOUNTER — Encounter: Payer: Self-pay | Admitting: Cardiology

## 2022-07-23 ENCOUNTER — Encounter: Payer: Self-pay | Admitting: Adult Health

## 2022-07-23 NOTE — Telephone Encounter (Signed)
Have you received this?

## 2022-07-24 NOTE — Telephone Encounter (Signed)
Please advise 

## 2022-08-13 ENCOUNTER — Encounter: Payer: Self-pay | Admitting: Cardiology

## 2022-08-13 ENCOUNTER — Encounter: Payer: Self-pay | Admitting: Adult Health

## 2022-08-14 ENCOUNTER — Other Ambulatory Visit: Payer: Self-pay | Admitting: Adult Health

## 2022-08-18 NOTE — Telephone Encounter (Signed)
Last OV 03/17/22

## 2022-08-26 ENCOUNTER — Encounter: Payer: Self-pay | Admitting: Adult Health

## 2022-08-26 ENCOUNTER — Telehealth: Payer: Self-pay | Admitting: Adult Health

## 2022-08-26 ENCOUNTER — Ambulatory Visit (INDEPENDENT_AMBULATORY_CARE_PROVIDER_SITE_OTHER): Payer: Medicare Other | Admitting: Adult Health

## 2022-08-26 VITALS — BP 120/60 | HR 56 | Temp 97.6°F | Wt 175.0 lb

## 2022-08-26 DIAGNOSIS — N182 Chronic kidney disease, stage 2 (mild): Secondary | ICD-10-CM

## 2022-08-26 DIAGNOSIS — I2510 Atherosclerotic heart disease of native coronary artery without angina pectoris: Secondary | ICD-10-CM | POA: Diagnosis not present

## 2022-08-26 DIAGNOSIS — E119 Type 2 diabetes mellitus without complications: Secondary | ICD-10-CM

## 2022-08-26 DIAGNOSIS — E118 Type 2 diabetes mellitus with unspecified complications: Secondary | ICD-10-CM

## 2022-08-26 DIAGNOSIS — B379 Candidiasis, unspecified: Secondary | ICD-10-CM

## 2022-08-26 LAB — BASIC METABOLIC PANEL
BUN: 29 mg/dL — ABNORMAL HIGH (ref 6–23)
CO2: 27 mEq/L (ref 19–32)
Calcium: 9.3 mg/dL (ref 8.4–10.5)
Chloride: 104 mEq/L (ref 96–112)
Creatinine, Ser: 1.49 mg/dL (ref 0.40–1.50)
GFR: 43.21 mL/min — ABNORMAL LOW (ref >60.00)
Glucose, Bld: 120 mg/dL — ABNORMAL HIGH (ref 70–99)
Potassium: 4.3 mEq/L (ref 3.5–5.1)
Sodium: 138 mEq/L (ref 135–145)

## 2022-08-26 LAB — HEMOGLOBIN A1C: Hgb A1c MFr Bld: 7.4 % — ABNORMAL HIGH (ref 4.6–6.5)

## 2022-08-26 MED ORDER — GLIPIZIDE ER 2.5 MG PO TB24: 2.5000 mg | ORAL_TABLET | Freq: Every day | ORAL | 1 refills | Status: DC

## 2022-08-26 MED ORDER — FLUCONAZOLE 150 MG PO TABS
150.0000 mg | ORAL_TABLET | Freq: Every day | ORAL | 0 refills | Status: DC
Start: 2022-08-26 — End: 2022-10-13

## 2022-08-26 MED ORDER — MICONAZOLE NITRATE 2 % EX CREA: 1.0000 | TOPICAL_CREAM | Freq: Two times a day (BID) | CUTANEOUS | 0 refills | Status: DC

## 2022-08-26 NOTE — Progress Notes (Signed)
Subjective:    Patient ID: Ian [name], male    DOB: 1940-12-29, 81 y.o.   MRN: [id number]  HPI  81 year old male who  has a past medical history of Allergy, Apnea, sleep (07/28/07), Carotid artery stenosis, Coronary heart disease (2008), Diabetes mellitus, GERD (gastroesophageal reflux disease), Hyperlipidemia, Hypertension, and Hypospadias.  He presents to the office today for an acute issue as well as chronic issue.  Acute issue has been present for the last 4 to 6 weeks.  He reports red burning rash on the head of his penis.  He does report possible mild blisters.  At home he has been applying Neosporin which helps alleviate some of the burning sensation but has not gotten rid of the rash  Additionally he would like to have his A1c and kidney function checked.  He is a diabetic with chronic kidney disease.  Currently prescribed Farxiga 10 mg daily by his nephrologist.  Did have his A1c checked while in [place] for the summer and in May it was 7.1. Lab Results  Component Value Date   HGBA1C 6.6 (H) 03/17/2022   Lab Results  Component Value Date   CREATININE 1.69 (H) 03/17/2022   BUN 34 (H) 03/17/2022   NA 135 03/17/2022   K 4.9 03/17/2022   CL 100 03/17/2022   CO2 25 03/17/2022     Review of Systems See HPI   Past Medical History:  Diagnosis Date   Allergy    Apnea, sleep 07/28/07   NPSG [place], AHI 79.9   Carotid artery stenosis    mild 04/2010, 05/2012   Coronary heart disease 2008   Cath 2008, 60-70% proximal diagonal 1 stenosis,  RCA 40% stenosis.  nonischemic Lexiscan 03/2011   Diabetes mellitus    type II   GERD (gastroesophageal reflux disease)    Hyperlipidemia    Hypertension    Hypospadias     Social History   Socioeconomic History   Marital status: Married    Spouse name: Not on file   Number of children: 3   Years of education: Not on file   Highest education level: Not on file  Occupational History   Occupation: Retired Therapist, occupational: RETIRED  Tobacco Use   Smoking status: Never   Smokeless tobacco: Never  Substance and Sexual Activity   Alcohol use: No    Alcohol/week: 0.0 standard drinks of alcohol   Drug use: No   Sexual activity: Not on file  Other Topics Concern   Not on file  Social History Narrative   Married.  Three children from first marriage.  First wife died of ovarian cancer.     Retired, did work for [employer] for 44 years.    Social Determinants of Health   Financial Resource Strain: Low Risk  (12/26/2021)   Overall Financial Resource Strain (CARDIA)    Difficulty of Paying Living Expenses: Not hard at all  Food Insecurity: No Food Insecurity (12/26/2021)   Hunger Vital Sign    Worried About Running Out of Food in the Last Year: Never true    Ran Out of Food in the Last Year: Never true  Transportation Needs: No Transportation Needs (12/26/2021)   PRAPARE - Hydrologist (Medical): No    Lack of Transportation (Non-Medical): No  Physical Activity: Unknown (12/26/2021)   Exercise Vital Sign    Days of Exercise per Week: Not on file    Minutes of  Exercise per Session: 30 min  Stress: No Stress Concern Present (12/26/2021)   [place]    Feeling of Stress : Not at all  Social Connections: [place] (12/26/2021)   Social Connection and Isolation Panel [NHANES]    Frequency of Communication with Friends and Family: More than three times a week    Frequency of Social Gatherings with Friends and Family: More than three times a week    Attends Religious Services: More than 4 times per year    Active Member of [employer] or Organizations: Yes    Attends Music therapist: More than 4 times per year    Marital Status: Married  Human resources officer Violence: Not At Risk (12/26/2021)   Humiliation, Afraid, Rape, and Kick questionnaire    Fear of Current or Ex-Partner: No    Emotionally  Abused: No    Physically Abused: No    Sexually Abused: No    Past Surgical History:  Procedure Laterality Date   EYE SURGERY     NASAL SINUS SURGERY     NISSEN FUNDOPLICATION     TONSILLECTOMY     VEIN LIGATION AND STRIPPING      Family History  Problem Relation Age of Onset   Coronary artery disease Father 86       CABG   Dementia Mother     Allergies  Allergen Reactions   Iohexol      Desc: pt. states severe reaction, dr. told patient never to have the dye again     Current Outpatient Medications on File Prior to Visit  Medication Sig Dispense Refill   aspirin 81 MG tablet Take 1 tablet (81 mg total) by mouth daily. 90 tablet 3   augmented betamethasone dipropionate (DIPROLENE-AF) 0.05 % ointment Apply topically.     betamethasone dipropionate 0.05 % cream Apply topically 2 (two) times daily.     Cholecalciferol (VITAMIN D3 PO) Take 10,000 Units by mouth once a week.     clindamycin (CLEOCIN T) 1 % external solution Apply topically.     Coenzyme Q10 (CO Q10) 100 MG CAPS Take 1 tablet by mouth daily.     econazole nitrate 1 % cream Apply topically daily. 15 g 2   fexofenadine (ALLEGRA) 180 MG tablet Take 1 tablet (180 mg total) by mouth daily. 90 tablet 3   fluticasone (FLONASE) 50 MCG/ACT nasal spray USE 2 SPRAYS IN EACH NOSTRIL DAILY 48 g 3   gabapentin (NEURONTIN) 300 MG capsule TAKE 1 TO 2 CAPSULES IN THE EVENING 180 capsule 3   GLUCOSAMINE-CHONDROITIN PO Take by mouth. 1500 mg glucosamine and 1200 mg of Chondroitin TWICE DAILY     hydrocortisone 2.5 % cream Apply topically 2 (two) times daily. 453.6 g 0   Lancets (ONETOUCH ULTRASOFT) lancets USE TO TEST BLOOD GLUCOSE TWICE DAILY 200 each 3   nitroGLYCERIN (NITROSTAT) 0.4 MG SL tablet DISSOLVE 1 TABLET UNDER THE TONGUE EVERY 5 MINUTES AS NEEDED FOR CHEST PAIN 75 tablet 14   olmesartan (BENICAR) 20 MG tablet Take 1 tablet (20 mg total) by mouth daily. 90 tablet 3   Omega-3 Fatty Acids (FISH OIL ULTRA) 1400 MG CAPS  Take by mouth.     sildenafil (VIAGRA) 25 MG tablet Take 1 tablet (25 mg total) by mouth daily as needed for erectile dysfunction. 20 tablet 3   SYSTANE ULTRA 0.4-0.3 % SOLN SMARTSIG:1 Drop(s) In Eye(s) PRN     tamsulosin (FLOMAX) 0.4 MG CAPS  capsule TAKE 1 CAPSULE DAILY 90 capsule 3   traMADol (ULTRAM) 50 MG tablet TAKE 1 TABLET EVERY 8 HOURS AS NEEDED 90 tablet 2   zolpidem (AMBIEN) 5 MG tablet TAKE 1 TABLET AT BEDTIME AS NEEDED FOR SLEEP 90 tablet 0   rosuvastatin (CRESTOR) 40 MG tablet Take 1 tablet (40 mg total) by mouth daily. 90 tablet 3   No current facility-administered medications on file prior to visit.    BP 120/60   Pulse (!) 56   Temp 97.6 F (36.4 C)   Wt 175 lb (79.4 kg)   SpO2 98%   BMI 27.41 kg/m       Objective:   Physical Exam Vitals and nursing note reviewed.  Constitutional:      Appearance: Normal appearance.  Cardiovascular:     Rate and Rhythm: Normal rate and regular rhythm.     Pulses: Normal pulses.     Heart sounds: Normal heart sounds.  Pulmonary:     Effort: Pulmonary effort is normal.     Breath sounds: Normal breath sounds.  Musculoskeletal:        General: Normal range of motion.  Skin:    General: Skin is warm and dry.     Findings: Rash present.     Comments: He does have a flat raised with the rash noted to the head of his penis above the urethra.  No vesicles noted  Neurological:     General: No focal deficit present.     Mental Status: He is alert and oriented to person, place, and time.  Psychiatric:        Mood and Affect: Mood normal.        Behavior: Behavior normal.        Thought Content: Thought content normal.        Assessment & Plan:   1. Yeast infection  - fluconazole (DIFLUCAN) 150 MG tablet; Take 1 tablet (150 mg total) by mouth daily.  Dispense: 1 tablet; Refill: 0 - miconazole (MICOTIN) 2 % cream; Apply 1 Application topically 2 (two) times daily.  Dispense: 28.35 g; Refill: 0  2. Type 2 diabetes mellitus  with complication, without long-term current use of insulin (HCC)  - Basic Metabolic Panel; Future - Hemoglobin A1c; Future - Hemoglobin Z6X - Basic Metabolic Panel  3. CKD (chronic kidney disease), stage II  - Basic Metabolic Panel; Future - Hemoglobin A1c; Future - Hemoglobin W9U - Basic Metabolic Panel   Dorothyann Peng, NP  Time spent with patient today was 31 minutes which consisted of chart review, discussing diagnosis, work up, treatment answering questions and documentation.

## 2022-08-26 NOTE — Telephone Encounter (Signed)
Updated patient on his labs. His A1c has increased to 7.4 which is not bad for age but he would like it a little lower.   Kidney function has improved since the last time we checked   I will send in Glipizide 2.5 mg XR daily   Follow up in 3 months for repeat testing

## 2022-08-27 ENCOUNTER — Encounter: Payer: Self-pay | Admitting: Adult Health

## 2022-08-27 DIAGNOSIS — B379 Candidiasis, unspecified: Secondary | ICD-10-CM

## 2022-08-28 MED ORDER — MICONAZOLE NITRATE 2 % EX CREA: 1.0000 | TOPICAL_CREAM | Freq: Two times a day (BID) | CUTANEOUS | 0 refills | Status: DC

## 2022-09-03 DIAGNOSIS — H43393 Other vitreous opacities, bilateral: Secondary | ICD-10-CM | POA: Diagnosis not present

## 2022-09-03 DIAGNOSIS — H35373 Puckering of macula, bilateral: Secondary | ICD-10-CM | POA: Diagnosis not present

## 2022-09-03 DIAGNOSIS — H40013 Open angle with borderline findings, low risk, bilateral: Secondary | ICD-10-CM | POA: Diagnosis not present

## 2022-09-03 DIAGNOSIS — H26491 Other secondary cataract, right eye: Secondary | ICD-10-CM | POA: Diagnosis not present

## 2022-09-03 LAB — HM DIABETES EYE EXAM

## 2022-09-09 ENCOUNTER — Other Ambulatory Visit: Payer: Self-pay | Admitting: Adult Health

## 2022-09-18 ENCOUNTER — Encounter: Payer: Self-pay | Admitting: Adult Health

## 2022-09-21 LAB — HM DIABETES EYE EXAM

## 2022-09-30 ENCOUNTER — Encounter: Payer: Self-pay | Admitting: Adult Health

## 2022-10-12 ENCOUNTER — Encounter: Payer: Self-pay | Admitting: Adult Health

## 2022-10-13 ENCOUNTER — Encounter: Payer: Self-pay | Admitting: Adult Health

## 2022-10-13 ENCOUNTER — Other Ambulatory Visit: Payer: Self-pay | Admitting: Adult Health

## 2022-10-13 MED ORDER — TERBINAFINE HCL 250 MG PO TABS: 250.0000 mg | ORAL_TABLET | Freq: Every day | ORAL | 0 refills | Status: DC

## 2022-10-14 DIAGNOSIS — N1831 Chronic kidney disease, stage 3a: Secondary | ICD-10-CM | POA: Diagnosis not present

## 2022-10-15 ENCOUNTER — Encounter: Payer: Self-pay | Admitting: Adult Health

## 2022-10-15 DIAGNOSIS — I129 Hypertensive chronic kidney disease with stage 1 through stage 4 chronic kidney disease, or unspecified chronic kidney disease: Secondary | ICD-10-CM | POA: Diagnosis not present

## 2022-10-15 DIAGNOSIS — E785 Hyperlipidemia, unspecified: Secondary | ICD-10-CM | POA: Diagnosis not present

## 2022-10-15 DIAGNOSIS — N1831 Chronic kidney disease, stage 3a: Secondary | ICD-10-CM | POA: Diagnosis not present

## 2022-10-15 DIAGNOSIS — E1122 Type 2 diabetes mellitus with diabetic chronic kidney disease: Secondary | ICD-10-CM | POA: Diagnosis not present

## 2022-10-16 ENCOUNTER — Other Ambulatory Visit: Payer: Self-pay

## 2022-10-16 DIAGNOSIS — E119 Type 2 diabetes mellitus without complications: Secondary | ICD-10-CM

## 2022-10-16 MED ORDER — GLIPIZIDE ER 2.5 MG PO TB24: 2.5000 mg | ORAL_TABLET | Freq: Every day | ORAL | 0 refills | Status: DC

## 2022-10-21 ENCOUNTER — Ambulatory Visit: Payer: Medicare Other | Admitting: Adult Health

## 2022-10-26 ENCOUNTER — Ambulatory Visit (INDEPENDENT_AMBULATORY_CARE_PROVIDER_SITE_OTHER): Payer: Medicare Other | Admitting: Family Medicine

## 2022-10-26 ENCOUNTER — Encounter: Payer: Self-pay | Admitting: Family Medicine

## 2022-10-26 VITALS — BP 120/60 | HR 60 | Temp 97.8°F | Ht 67.0 in | Wt 175.7 lb

## 2022-10-26 DIAGNOSIS — L853 Xerosis cutis: Secondary | ICD-10-CM

## 2022-10-26 DIAGNOSIS — I2510 Atherosclerotic heart disease of native coronary artery without angina pectoris: Secondary | ICD-10-CM

## 2022-10-26 DIAGNOSIS — E1142 Type 2 diabetes mellitus with diabetic polyneuropathy: Secondary | ICD-10-CM | POA: Diagnosis not present

## 2022-10-26 NOTE — Progress Notes (Signed)
Established Patient Office Visit  Subjective   Patient ID: Ian [name], male    DOB: 10-02-1941  Age: 81 y.o. MRN: [id number]  Chief Complaint  Patient presents with   Skin Problem    Patient complains of skin problem on flank, x1 year    HPI   [name] is seen for the following issues  He has history of type 2 diabetes with neuropathy and also history of callus formation on his feet.  No active callus or ulcer at this time.  He has diabetic shoes but these are getting older and is requesting prescription for new diabetic shoes.  He had recent A1c of 7.4%.  Recently changed to glipizide and also taking [name].  He is followed regularly for his diabetes.  Other issue is he states he has significant dry skin especially on his buttocks.  He uses a type of Dove soap and tries to avoid prolonged bathing or excessively hot water.  He is use Desitin with some minimal relief.  He has some irritation especially near the top of the buttock at the crack.  Walks about a mile per day.  Stays fairly active.  Past Medical History:  Diagnosis Date   Allergy    Apnea, sleep 07/28/07   NPSG [place], AHI 79.9   Carotid artery stenosis    mild 04/2010, 05/2012   Coronary heart disease 2008   Cath 2008, 60-70% proximal diagonal 1 stenosis,  RCA 40% stenosis.  nonischemic Lexiscan 03/2011   Diabetes mellitus    type II   GERD (gastroesophageal reflux disease)    Hyperlipidemia    Hypertension    Hypospadias    Past Surgical History:  Procedure Laterality Date   EYE SURGERY     NASAL SINUS SURGERY     NISSEN FUNDOPLICATION     TONSILLECTOMY     VEIN LIGATION AND STRIPPING      reports that he has never smoked. He has never used smokeless tobacco. He reports that he does not drink alcohol and does not use drugs. family history includes Coronary artery disease (age of onset: 76) in his father; Dementia in his mother. Allergies  Allergen Reactions   Iohexol      Desc: pt. states severe  reaction, dr. told patient never to have the dye again     Review of Systems  Constitutional:  Negative for malaise/fatigue.  Eyes:  Negative for blurred vision.  Respiratory:  Negative for shortness of breath.   Cardiovascular:  Negative for chest pain.  Neurological:  Negative for dizziness, weakness and headaches.      Objective:     BP 120/60 (BP Location: Left Arm, Patient Position: Sitting, Cuff Size: Normal)   Pulse 60   Temp 97.8 F (36.6 C) (Oral)   Ht '5\' 7"'$  (1.702 m)   Wt 175 lb 11.2 oz (79.7 kg)   SpO2 98%   BMI 27.52 kg/m  BP Readings from Last 3 Encounters:  10/26/22 120/60  08/26/22 120/60  04/10/22 118/60   Wt Readings from Last 3 Encounters:  10/26/22 175 lb 11.2 oz (79.7 kg)  08/26/22 175 lb (79.4 kg)  04/10/22 177 lb 11.2 oz (80.6 kg)      Physical Exam Vitals reviewed.  Constitutional:      Appearance: Normal appearance.  Cardiovascular:     Rate and Rhythm: Normal rate and regular rhythm.  Pulmonary:     Effort: Pulmonary effort is normal.     Breath sounds: Normal breath sounds.  Skin:    General: Skin is dry.     Comments: Buttocks examined.  He has some very dry appearing skin patches especially left buttock.  No ulceration.  Neurological:     Mental Status: He is alert.      No results found for any visits on 10/26/22.    The ASCVD Risk score (Arnett DK, et al., 2019) failed to calculate for the following reasons:   The 2019 ASCVD risk score is only valid for ages 20 to 46    Assessment & Plan:   #1 type 2 diabetes with neuropathy.  Prescription written for diabetic shoes.  He does have increased risk of diabetic foot ulcer with his history of neuropathy and also history of callus formation.  #2 dry skin.  Currently using Dove soap.  Stressed importance again of avoiding prolonged bathing or excessively hot water.  Consider moisturizer such as Lac-Hydrin to affected skin once or twice daily We discussed getting adequate oral  hydration on a regular basis  No follow-ups on file.    Carolann Littler, MD

## 2022-10-26 NOTE — Patient Instructions (Signed)
Consider Lac-Hydrin for dry skin patches.

## 2022-11-07 ENCOUNTER — Other Ambulatory Visit: Payer: Self-pay | Admitting: Adult Health

## 2022-11-07 DIAGNOSIS — Z760 Encounter for issue of repeat prescription: Secondary | ICD-10-CM

## 2022-11-08 ENCOUNTER — Other Ambulatory Visit: Payer: Self-pay | Admitting: Adult Health

## 2022-11-10 NOTE — Telephone Encounter (Signed)
Okay for refill?  

## 2022-11-16 DIAGNOSIS — L2089 Other atopic dermatitis: Secondary | ICD-10-CM | POA: Diagnosis not present

## 2022-11-17 ENCOUNTER — Telehealth: Payer: Self-pay | Admitting: Cardiology

## 2022-11-17 NOTE — Telephone Encounter (Signed)
Spoke to patient, aware no labs needed prior to appt.

## 2022-11-17 NOTE — Telephone Encounter (Signed)
Patient would like to know if he needs to have any lab tests prior to his appointment on Friday, 1/12.

## 2022-11-19 NOTE — Progress Notes (Signed)
Cardiology Office Note   Date:  11/20/2022   ID:  Ian [name], DOB Mar 18, 1941, MRN [id number]  PCP:  Dorothyann Peng, NP  Cardiologist:   Minus Breeding, MD  Referring:  Dorothyann Peng, NP  Chief Complaint  Patient presents with   Coronary Artery Disease      History of Present Illness: Ian [name] is a 82 y.o. male who presents for follow up of CAD. He had a heart catheterization in 2018.  He had a well-preserved ejection fraction.  He had a left main 30% stenosis.  He had some mild plaque in his LAD with a 60 to 70% lesion in the first diagonal.  He had a small nondominant right.  There is no mention of any significant circumflex stenosis.  He had a stress perfusion study in 2010.  Demonstrated no significant abnormalities.  Another stress study in 2012 was unremarkable.  Stress echo in 2014 and most recently in 2019 were both unremarkable.  He is sinus bradycardia.  He had right bundle branch block.  Is been followed for very mild carotid plaque.  He had a POET (Plain Old Exercise Treadmill) in March .    Since I last saw him he has had no new cardiac problems.  He still goes up to [place] and his family up there.  He walks a mile a day. The patient denies any new symptoms such as chest discomfort, neck or arm discomfort. There has been no new shortness of breath, PND or orthopnea. There have been no reported palpitations, presyncope or syncope.    Past Medical History:  Diagnosis Date   Allergy    Apnea, sleep 07/28/07   NPSG [place], AHI 79.9   Carotid artery stenosis    mild 04/2010, 05/2012   Coronary heart disease 2008   Cath 2008, 60-70% proximal diagonal 1 stenosis,  RCA 40% stenosis.  nonischemic Lexiscan 03/2011   Diabetes mellitus    type II   GERD (gastroesophageal reflux disease)    Hyperlipidemia    Hypertension    Hypospadias     Past Surgical History:  Procedure Laterality Date   EYE SURGERY     NASAL SINUS SURGERY     NISSEN FUNDOPLICATION      TONSILLECTOMY     VEIN LIGATION AND STRIPPING       Current Outpatient Medications  Medication Sig Dispense Refill   aspirin 81 MG tablet Take 1 tablet (81 mg total) by mouth daily. 90 tablet 3   augmented betamethasone dipropionate (DIPROLENE-AF) 0.05 % ointment Apply topically.     betamethasone dipropionate 0.05 % cream Apply topically 2 (two) times daily.     Cholecalciferol (VITAMIN D3 PO) Take 10,000 Units by mouth once a week.     Coenzyme Q10 (CO Q10) 100 MG CAPS Take 1 tablet by mouth daily.     econazole nitrate 1 % cream Apply topically daily. 15 g 2   FARXIGA 10 MG TABS tablet Take 10 mg by mouth daily.     fexofenadine (ALLEGRA) 180 MG tablet Take 1 tablet (180 mg total) by mouth daily. 90 tablet 3   fluticasone (FLONASE) 50 MCG/ACT nasal spray USE 2 SPRAYS IN EACH NOSTRIL DAILY 48 g 3   gabapentin (NEURONTIN) 300 MG capsule TAKE 1 TO 2 CAPSULES IN THE EVENING 180 capsule 3   glipiZIDE (GLUCOTROL XL) 2.5 MG 24 hr tablet Take 1 tablet (2.5 mg total) by mouth daily with breakfast. 90 tablet 1   glipiZIDE (  GLUCOTROL XL) 2.5 MG 24 hr tablet TAKE 1 TABLET BY MOUTH DAILY WITH BREAKFAST. 90 tablet 0   GLUCOSAMINE-CHONDROITIN PO Take by mouth. 1500 mg glucosamine and 1200 mg of Chondroitin TWICE DAILY     hydrocortisone 2.5 % cream Apply topically 2 (two) times daily. 453.6 g 0   Lancets (ONETOUCH ULTRASOFT) lancets USE TO TEST BLOOD GLUCOSE TWICE DAILY 200 each 3   miconazole (MICOTIN) 2 % cream Apply 1 Application topically 2 (two) times daily. 28.35 g 0   nitroGLYCERIN (NITROSTAT) 0.4 MG SL tablet DISSOLVE 1 TABLET UNDER THE TONGUE EVERY 5 MINUTES AS NEEDED FOR CHEST PAIN 75 tablet 14   olmesartan (BENICAR) 20 MG tablet TAKE 1 TABLET DAILY 90 tablet 3   Omega-3 Fatty Acids (FISH OIL ULTRA) 1400 MG CAPS Take by mouth.     sildenafil (VIAGRA) 25 MG tablet Take 1 tablet (25 mg total) by mouth daily as needed for erectile dysfunction. 20 tablet 3   silver sulfADIAZINE (SILVADENE) 1 %  cream Apply 1 Application topically 2 (two) times daily.     SYSTANE ULTRA 0.4-0.3 % SOLN SMARTSIG:1 Drop(s) In Eye(s) PRN     tamsulosin (FLOMAX) 0.4 MG CAPS capsule TAKE 1 CAPSULE DAILY 90 capsule 3   terbinafine (LAMISIL) 250 MG tablet Take 1 tablet (250 mg total) by mouth daily. 30 tablet 0   traMADol (ULTRAM) 50 MG tablet TAKE 1 TABLET EVERY 8 HOURS AS NEEDED 90 tablet 2   triamcinolone cream (KENALOG) 0.1 % Apply 1 Application topically 2 (two) times daily.     zolpidem (AMBIEN) 5 MG tablet TAKE 1 TABLET AT BEDTIME AS NEEDED FOR SLEEP 90 tablet 0   rosuvastatin (CRESTOR) 40 MG tablet Take 1 tablet (40 mg total) by mouth daily. 90 tablet 3   No current facility-administered medications for this visit.    Allergies:   Iohexol    ROS:  Please see the history of present illness.   Otherwise, review of systems are positive for none.   All other systems are reviewed and negative.    PHYSICAL EXAM: VS:  BP 128/70   Pulse (!) 57   Ht '5\' 6"'$  (1.676 m)   Wt 177 lb 9.6 oz (80.6 kg)   SpO2 95%   BMI 28.67 kg/m  , BMI Body mass index is 28.67 kg/m.  GENERAL:  Well appearing NECK:  No jugular venous distention, waveform within normal limits, carotid upstroke brisk and symmetric, no bruits, no thyromegaly LUNGS:  Clear to auscultation bilaterally CHEST:  Unremarkable HEART:  PMI not displaced or sustained,S1 and S2 within normal limits, no S3, no S4, no clicks, no rubs, no murmurs ABD:  Flat, positive bowel sounds normal in frequency in pitch, no bruits, no rebound, no guarding, no midline pulsatile mass, no hepatomegaly, no splenomegaly EXT:  2 plus pulses throughout, no edema, no cyanosis no clubbing   EKG:  EKG is   ordered today. Sinus rhythm, rate 57, right bundle branch block, premature atrial contractions, nonspecific T wave flattening.  No change from previous.  Recent Labs: 03/17/2022: ALT 18; Hemoglobin 13.3; Platelets 195.0; TSH 0.92 08/26/2022: BUN 29; Creatinine, Ser 1.49;  Potassium 4.3; Sodium 138    Lipid Panel    Component Value Date/Time   CHOL 146 03/17/2022 1616   CHOL 134 04/12/2020 1217   TRIG 91.0 03/17/2022 1616   HDL 50.80 03/17/2022 1616   HDL 47 04/12/2020 1217   CHOLHDL 3 03/17/2022 1616   VLDL 18.2 03/17/2022 1616  LDLCALC 77 03/17/2022 1616   LDLCALC 64 08/30/2020 0845     Lab Results  Component Value Date   CREATININE 1.49 08/26/2022    Wt Readings from Last 3 Encounters:  11/20/22 177 lb 9.6 oz (80.6 kg)  10/26/22 175 lb 11.2 oz (79.7 kg)  08/26/22 175 lb (79.4 kg)      Other studies Reviewed: Additional studies/ records that were reviewed today include: None Review of the above records demonstrates:   See above.   ASSESSMENT AND PLAN:  CAD:  The patient has no new sypmtoms.  No further cardiovascular testing is indicated.  We will continue with aggressive risk reduction and meds as listed.  HTN:   The blood pressure is at target.  No change in therapy.   DYSLIPIDEMIA:   LDL was 77 with an HDL of 50.8.  No change in therapy.    DM:  His A1c was 7.4 but he is actively had his meds adjusted and says it is coming down.    CKD:   Creatinine was 1.49 which is about the same as previous and he is having this followed closely.   Current medicines are reviewed at length with the patient today.  The patient does not have concerns regarding medicines.  The following changes have been made: None  Labs/ tests ordered today include: None  No orders of the defined types were placed in this encounter.     Disposition:   FU with me in 12 months.     Signed, Minus Breeding, MD  11/20/2022 12:16 PM    [place]

## 2022-11-20 ENCOUNTER — Ambulatory Visit: Payer: Medicare Other | Attending: Cardiology | Admitting: Cardiology

## 2022-11-20 ENCOUNTER — Encounter: Payer: Self-pay | Admitting: Cardiology

## 2022-11-20 VITALS — BP 128/70 | HR 57 | Ht 66.0 in | Wt 177.6 lb

## 2022-11-20 DIAGNOSIS — I10 Essential (primary) hypertension: Secondary | ICD-10-CM | POA: Diagnosis not present

## 2022-11-20 DIAGNOSIS — I2510 Atherosclerotic heart disease of native coronary artery without angina pectoris: Secondary | ICD-10-CM | POA: Diagnosis not present

## 2022-11-20 DIAGNOSIS — N182 Chronic kidney disease, stage 2 (mild): Secondary | ICD-10-CM | POA: Insufficient documentation

## 2022-11-20 DIAGNOSIS — E785 Hyperlipidemia, unspecified: Secondary | ICD-10-CM | POA: Insufficient documentation

## 2022-11-20 NOTE — Patient Instructions (Signed)
Medication Instructions:  NO CHANGES *If you need a refill on your cardiac medications before your next appointment, please call your pharmacy*   Follow-Up: At [facility], you and your health needs are our priority.  As part of our continuing mission to provide you with exceptional heart care, we have created designated Provider Care Teams.  These Care Teams include your primary Cardiologist (physician) and Advanced Practice Providers (APPs -  Physician Assistants and Nurse Practitioners) who all work together to provide you with the care you need, when you need it.  We recommend signing up for the patient portal called "MyChart".  Sign up information is provided on this After Visit Summary.  MyChart is used to connect with patients for Virtual Visits (Telemedicine).  Patients are able to view lab/test results, encounter notes, upcoming appointments, etc.  Non-urgent messages can be sent to your provider as well.   To learn more about what you can do with MyChart, go to [email or web address].    Your next appointment:   1 year(s)  Provider:   Minus Breeding, MD

## 2022-12-01 ENCOUNTER — Ambulatory Visit (INDEPENDENT_AMBULATORY_CARE_PROVIDER_SITE_OTHER): Payer: Medicare Other | Admitting: Adult Health

## 2022-12-01 ENCOUNTER — Encounter: Payer: Self-pay | Admitting: Adult Health

## 2022-12-01 VITALS — BP 128/50 | HR 64 | Ht 66.0 in | Wt 176.0 lb

## 2022-12-01 DIAGNOSIS — I2510 Atherosclerotic heart disease of native coronary artery without angina pectoris: Secondary | ICD-10-CM | POA: Diagnosis not present

## 2022-12-01 DIAGNOSIS — B379 Candidiasis, unspecified: Secondary | ICD-10-CM | POA: Diagnosis not present

## 2022-12-01 NOTE — Progress Notes (Signed)
Subjective:    Patient ID: Ian [name], male    DOB: [birth year], 82 y.o.   MRN: [id number]  HPI  82 year old male who  has a past medical history of Allergy, Apnea, sleep (07/28/07), Carotid artery stenosis, Coronary heart disease (2008), Diabetes mellitus, GERD (gastroesophageal reflux disease), Hyperlipidemia, Hypertension, and Hypospadias.  He presents to the office today for an acute on chronic issue. He reports a irritating, burning, and red rash on the head of his penis. He has been treated multiple times over the years with antifungals, most recently being in December 2023 with lamisil 250 mg PO daily. X 30 days. He reports that the antifungal creams and pills do help but it does not completely resolve it. He does keep the area clean and dry     Review of Systems See HPI   Past Medical History:  Diagnosis Date   Allergy    Apnea, sleep 07/28/07   NPSG [place], AHI 79.9   Carotid artery stenosis    mild 04/2010, 05/2012   Coronary heart disease 2008   Cath 2008, 60-70% proximal diagonal 1 stenosis,  RCA 40% stenosis.  nonischemic Lexiscan 03/2011   Diabetes mellitus    type II   GERD (gastroesophageal reflux disease)    Hyperlipidemia    Hypertension    Hypospadias     Social History   Socioeconomic History   Marital status: Married    Spouse name: Not on file   Number of children: 3   Years of education: Not on file   Highest education level: Not on file  Occupational History   Occupation: Retired Games developer: RETIRED  Tobacco Use   Smoking status: Never   Smokeless tobacco: Never  Substance and Sexual Activity   Alcohol use: No    Alcohol/week: 0.0 standard drinks of alcohol   Drug use: No   Sexual activity: Not on file  Other Topics Concern   Not on file  Social History Narrative   Married.  Three children from first marriage.  First wife died of ovarian cancer.     Retired, did work for [employer] for 44 years.    Social  Determinants of Health   Financial Resource Strain: Low Risk  (12/26/2021)   Overall Financial Resource Strain (CARDIA)    Difficulty of Paying Living Expenses: Not hard at all  Food Insecurity: No Food Insecurity (12/26/2021)   Hunger Vital Sign    Worried About Running Out of Food in the Last Year: Never true    Ran Out of Food in the Last Year: Never true  Transportation Needs: No Transportation Needs (12/26/2021)   PRAPARE - Hydrologist (Medical): No    Lack of Transportation (Non-Medical): No  Physical Activity: Unknown (12/26/2021)   Exercise Vital Sign    Days of Exercise per Week: Not on file    Minutes of Exercise per Session: 30 min  Stress: No Stress Concern Present (12/26/2021)   [place]    Feeling of Stress : Not at all  Social Connections: [place] (12/26/2021)   Social Connection and Isolation Panel [NHANES]    Frequency of Communication with Friends and Family: More than three times a week    Frequency of Social Gatherings with Friends and Family: More than three times a week    Attends Religious Services: More than 4 times per year    Active  Member of Clubs or Organizations: Yes    Attends Music therapist: More than 4 times per year    Marital Status: Married  Human resources officer Violence: Not At Risk (12/26/2021)   Humiliation, Afraid, Rape, and Kick questionnaire    Fear of Current or Ex-Partner: No    Emotionally Abused: No    Physically Abused: No    Sexually Abused: No    Past Surgical History:  Procedure Laterality Date   EYE SURGERY     NASAL SINUS SURGERY     NISSEN FUNDOPLICATION     TONSILLECTOMY     VEIN LIGATION AND STRIPPING      Family History  Problem Relation Age of Onset   Coronary artery disease Father 66       CABG   Dementia Mother     Allergies  Allergen Reactions   Iohexol      Desc: pt. states severe reaction,  dr. told patient never to have the dye again     Current Outpatient Medications on File Prior to Visit  Medication Sig Dispense Refill   aspirin 81 MG tablet Take 1 tablet (81 mg total) by mouth daily. 90 tablet 3   augmented betamethasone dipropionate (DIPROLENE-AF) 0.05 % ointment Apply topically.     betamethasone dipropionate 0.05 % cream Apply topically 2 (two) times daily.     Cholecalciferol (VITAMIN D3 PO) Take 10,000 Units by mouth once a week.     Coenzyme Q10 (CO Q10) 100 MG CAPS Take 1 tablet by mouth daily.     econazole nitrate 1 % cream Apply topically daily. 15 g 2   FARXIGA 10 MG TABS tablet Take 10 mg by mouth daily.     fexofenadine (ALLEGRA) 180 MG tablet Take 1 tablet (180 mg total) by mouth daily. 90 tablet 3   fluticasone (FLONASE) 50 MCG/ACT nasal spray USE 2 SPRAYS IN EACH NOSTRIL DAILY 48 g 3   gabapentin (NEURONTIN) 300 MG capsule TAKE 1 TO 2 CAPSULES IN THE EVENING 180 capsule 3   glipiZIDE (GLUCOTROL XL) 2.5 MG 24 hr tablet Take 1 tablet (2.5 mg total) by mouth daily with breakfast. 90 tablet 1   GLUCOSAMINE-CHONDROITIN PO Take by mouth. 1500 mg glucosamine and 1200 mg of Chondroitin TWICE DAILY     hydrocortisone 2.5 % cream Apply topically 2 (two) times daily. 453.6 g 0   Lancets (ONETOUCH ULTRASOFT) lancets USE TO TEST BLOOD GLUCOSE TWICE DAILY 200 each 3   LORazepam (ATIVAN) 0.5 MG tablet Take 0.5 mg by mouth 2 (two) times daily as needed.     miconazole (MICOTIN) 2 % cream Apply 1 Application topically 2 (two) times daily. 28.35 g 0   nitroGLYCERIN (NITROSTAT) 0.4 MG SL tablet DISSOLVE 1 TABLET UNDER THE TONGUE EVERY 5 MINUTES AS NEEDED FOR CHEST PAIN 75 tablet 14   olmesartan (BENICAR) 20 MG tablet TAKE 1 TABLET DAILY 90 tablet 3   Omega-3 Fatty Acids (FISH OIL ULTRA) 1400 MG CAPS Take by mouth.     sildenafil (VIAGRA) 25 MG tablet Take 1 tablet (25 mg total) by mouth daily as needed for erectile dysfunction. 20 tablet 3   silver sulfADIAZINE (SILVADENE) 1  % cream Apply 1 Application topically 2 (two) times daily.     SYSTANE ULTRA 0.4-0.3 % SOLN SMARTSIG:1 Drop(s) In Eye(s) PRN     tamsulosin (FLOMAX) 0.4 MG CAPS capsule TAKE 1 CAPSULE DAILY 90 capsule 3   terbinafine (LAMISIL) 250 MG tablet Take 1 tablet (250  mg total) by mouth daily. 30 tablet 0   traMADol (ULTRAM) 50 MG tablet TAKE 1 TABLET EVERY 8 HOURS AS NEEDED 90 tablet 2   triamcinolone cream (KENALOG) 0.1 % Apply 1 Application topically 2 (two) times daily.     zolpidem (AMBIEN) 5 MG tablet TAKE 1 TABLET AT BEDTIME AS NEEDED FOR SLEEP 90 tablet 0   rosuvastatin (CRESTOR) 40 MG tablet Take 1 tablet (40 mg total) by mouth daily. 90 tablet 3   No current facility-administered medications on file prior to visit.    BP (!) 140/70   Pulse 64   Ht '5\' 6"'$  (1.676 m)   Wt 176 lb (79.8 kg)   SpO2 99%   BMI 28.41 kg/m       Objective:   Physical Exam Vitals and nursing note reviewed.  Constitutional:      Appearance: Normal appearance.  Genitourinary:    Penis: Uncircumcised. Erythema present.      Comments: Light red flat rash on head of penis and forskin  Skin:    General: Skin is warm and dry.     Capillary Refill: Capillary refill takes less than 2 seconds.     Findings: Erythema and rash present.  Neurological:     Mental Status: He is alert and oriented to person, place, and time.        Assessment & Plan:  1. Yeast infection - He has a prescription for silvadene and topical steroid cream. He was advised to try this for a few days to see if it makes any difference. Also advised to follow up with Dermatology   Dorothyann Peng, NP

## 2022-12-16 DIAGNOSIS — L2089 Other atopic dermatitis: Secondary | ICD-10-CM | POA: Diagnosis not present

## 2022-12-16 DIAGNOSIS — L508 Other urticaria: Secondary | ICD-10-CM | POA: Diagnosis not present

## 2022-12-16 DIAGNOSIS — D485 Neoplasm of uncertain behavior of skin: Secondary | ICD-10-CM | POA: Diagnosis not present

## 2022-12-29 ENCOUNTER — Telehealth: Payer: Medicare Other | Admitting: Family Medicine

## 2022-12-29 DIAGNOSIS — E119 Type 2 diabetes mellitus without complications: Secondary | ICD-10-CM | POA: Diagnosis not present

## 2022-12-29 DIAGNOSIS — L309 Dermatitis, unspecified: Secondary | ICD-10-CM | POA: Diagnosis not present

## 2022-12-29 DIAGNOSIS — Z1330 Encounter for screening examination for mental health and behavioral disorders, unspecified: Secondary | ICD-10-CM | POA: Diagnosis not present

## 2022-12-30 DIAGNOSIS — L2089 Other atopic dermatitis: Secondary | ICD-10-CM | POA: Diagnosis not present

## 2023-01-12 ENCOUNTER — Ambulatory Visit: Payer: Medicare Other | Admitting: Adult Health

## 2023-01-12 DIAGNOSIS — M25562 Pain in left knee: Secondary | ICD-10-CM | POA: Diagnosis not present

## 2023-02-01 ENCOUNTER — Other Ambulatory Visit: Payer: Self-pay | Admitting: Adult Health

## 2023-02-01 DIAGNOSIS — E119 Type 2 diabetes mellitus without complications: Secondary | ICD-10-CM

## 2023-02-17 ENCOUNTER — Other Ambulatory Visit: Payer: Self-pay | Admitting: Adult Health

## 2023-02-18 NOTE — Telephone Encounter (Signed)
Okay for refill?  

## 2023-02-23 ENCOUNTER — Ambulatory Visit (INDEPENDENT_AMBULATORY_CARE_PROVIDER_SITE_OTHER): Payer: Medicare Other | Admitting: Primary Care

## 2023-02-23 ENCOUNTER — Encounter: Payer: Self-pay | Admitting: Emergency Medicine

## 2023-02-23 ENCOUNTER — Encounter: Payer: Self-pay | Admitting: Primary Care

## 2023-02-23 VITALS — BP 112/62 | HR 61 | Ht 67.0 in | Wt 174.0 lb

## 2023-02-23 DIAGNOSIS — G4733 Obstructive sleep apnea (adult) (pediatric): Secondary | ICD-10-CM

## 2023-02-23 NOTE — Assessment & Plan Note (Addendum)
-   Well controlled; Patient has severe obstructive sleep apnea. He has snoring symptoms at baseline. His last sleep study was done in 2014, AHI 14/hour. He is intolerant to CPAP. He has been wearing oral appliance nightly for management of his OSA for 15 years. Sleeping well. No significant daytime sleepiness. He is established with Dr. Myrtis Ser, needs script for new oral appliance. Follow-up as needed.

## 2023-02-23 NOTE — Progress Notes (Addendum)
@Patient  ID: Ian [name], male    DOB: [birth year], 82 y.o.   MRN: [id number]  Chief Complaint  Patient presents with   Consult    Referring provider: Shirline Frees, NP  HPI: 82 year old male, never smoked.  Past medical history significant for sleep apnea, chronic insomnia, restless leg syndrome, diaphragmatic disorder, hypertension, coronary artery disease, bilateral carotid artery stenosis, type 2 diabetes, chronic kidney disease stage II. Patient was last seen by Dr. Isaiah Serge on 03/24/2018.  Sleep study 03/13/2013 >> AHI 17 (Weight 225lb)  02/23/2023 Patient presents today for overdue follow-up/OSA. He is established with Dr. Myrtis Ser for management of his sleep apnea. He has been wearing an oral appliance for 15 years. Lining is deteriorating. He needs a prescription for new oral device. He feels he sleeps well when wearing oral appliance. Very rarely he will take out oral appliance in the morning and wife will complain about his snoring. He is intolerant to CPAP.    Allergies  Allergen Reactions   Iohexol      Desc: pt. states severe reaction, dr. told patient never to have the dye again     Immunization History  Administered Date(s) Administered   Hepatitis A, Adult 03/09/2017, 09/23/2017   Influenza Split 08/15/2012   Influenza Whole 08/17/2008, 07/23/2009, 08/19/2010   Influenza, High Dose Seasonal PF 08/15/2013, 09/04/2014, 08/10/2015, 09/11/2016, 08/14/2017, 08/23/2018, 07/21/2022   Influenza-Unspecified 07/24/2020, 08/04/2021   PFIZER Comirnaty(Gray Top)Covid-19 Tri-Sucrose Vaccine 03/24/2021   PFIZER(Purple Top)SARS-COV-2 Vaccination 12/15/2019, 01/09/2020, 08/09/2020   Pneumococcal Conjugate-13 10/11/2013, 05/30/2014   Pneumococcal Polysaccharide-23 08/09/2006, 07/23/2009   Respiratory Syncytial Virus Vaccine,Recomb Aduvanted(Arexvy) 07/09/2022   Td 07/01/2009   Zoster Recombinat (Shingrix) 04/02/2017, 06/16/2017   Zoster, Live 09/12/2012    Past Medical History:   Diagnosis Date   Allergy    Apnea, sleep 07/28/07   NPSG [place], AHI 79.9   Carotid artery stenosis    mild 04/2010, 05/2012   Coronary heart disease 2008   Cath 2008, 60-70% proximal diagonal 1 stenosis,  RCA 40% stenosis.  nonischemic Lexiscan 03/2011   Diabetes mellitus    type II   GERD (gastroesophageal reflux disease)    Hyperlipidemia    Hypertension    Hypospadias     Tobacco History: Social History   Tobacco Use  Smoking Status Never  Smokeless Tobacco Never   Counseling given: Not Answered   Outpatient Medications Prior to Visit  Medication Sig Dispense Refill   aspirin 81 MG tablet Take 1 tablet (81 mg total) by mouth daily. 90 tablet 3   Cholecalciferol (VITAMIN D3 PO) Take 10,000 Units by mouth once a week.     Coenzyme Q10 (CO Q10) 100 MG CAPS Take 1 tablet by mouth daily.     fexofenadine (ALLEGRA) 180 MG tablet Take 1 tablet (180 mg total) by mouth daily. 90 tablet 3   gabapentin (NEURONTIN) 300 MG capsule TAKE 1 TO 2 CAPSULES IN THE EVENING 180 capsule 3   glipiZIDE (GLUCOTROL XL) 2.5 MG 24 hr tablet TAKE 1 TABLET DAILY WITH BREAKFAST 90 tablet 0   GLUCOSAMINE-CHONDROITIN PO Take by mouth. 1500 mg glucosamine and 1200 mg of Chondroitin TWICE DAILY     Lancets (ONETOUCH ULTRASOFT) lancets USE TO TEST BLOOD GLUCOSE TWICE DAILY 200 each 3   nitroGLYCERIN (NITROSTAT) 0.4 MG SL tablet DISSOLVE 1 TABLET UNDER THE TONGUE EVERY 5 MINUTES AS NEEDED FOR CHEST PAIN 75 tablet 14   olmesartan (BENICAR) 20 MG tablet TAKE 1 TABLET DAILY 90 tablet 3  Omega-3 Fatty Acids (FISH OIL ULTRA) 1400 MG CAPS Take by mouth.     tamsulosin (FLOMAX) 0.4 MG CAPS capsule TAKE 1 CAPSULE DAILY 90 capsule 3   traMADol (ULTRAM) 50 MG tablet TAKE 1 TABLET EVERY 8 HOURS AS NEEDED 90 tablet 2   zolpidem (AMBIEN) 5 MG tablet TAKE 1 TABLET AT BEDTIME AS NEEDED FOR SLEEP 90 tablet 0   augmented betamethasone dipropionate (DIPROLENE-AF) 0.05 % ointment Apply topically. (Patient not taking:  Reported on 02/23/2023)     betamethasone dipropionate 0.05 % cream Apply topically 2 (two) times daily. (Patient not taking: Reported on 02/23/2023)     econazole nitrate 1 % cream Apply topically daily. (Patient not taking: Reported on 02/23/2023) 15 g 2   fluticasone (FLONASE) 50 MCG/ACT nasal spray USE 2 SPRAYS IN EACH NOSTRIL DAILY (Patient not taking: Reported on 02/23/2023) 48 g 3   hydrocortisone 2.5 % cream Apply topically 2 (two) times daily. (Patient not taking: Reported on 02/23/2023) 453.6 g 0   miconazole (MICOTIN) 2 % cream Apply 1 Application topically 2 (two) times daily. (Patient not taking: Reported on 02/23/2023) 28.35 g 0   rosuvastatin (CRESTOR) 40 MG tablet Take 1 tablet (40 mg total) by mouth daily. 90 tablet 3   sildenafil (VIAGRA) 25 MG tablet Take 1 tablet (25 mg total) by mouth daily as needed for erectile dysfunction. (Patient not taking: Reported on 02/23/2023) 20 tablet 3   silver sulfADIAZINE (SILVADENE) 1 % cream Apply 1 Application topically 2 (two) times daily.     SYSTANE ULTRA 0.4-0.3 % SOLN SMARTSIG:1 Drop(s) In Eye(s) PRN (Patient not taking: Reported on 02/23/2023)     terbinafine (LAMISIL) 250 MG tablet Take 1 tablet (250 mg total) by mouth daily. (Patient not taking: Reported on 02/23/2023) 30 tablet 0   FARXIGA 10 MG TABS tablet Take 10 mg by mouth daily.     LORazepam (ATIVAN) 0.5 MG tablet Take 0.5 mg by mouth 2 (two) times daily as needed.     triamcinolone cream (KENALOG) 0.1 % Apply 1 Application topically 2 (two) times daily.     No facility-administered medications prior to visit.    Review of Systems  Review of Systems  Constitutional: Negative.  Negative for fatigue.  HENT: Negative.    Respiratory: Negative.    Cardiovascular: Negative.      Physical Exam  BP 112/62 (BP Location: Left Arm, Patient Position: Sitting, Cuff Size: Normal)   Pulse 61   Ht  (1.702 m)   Wt 174 lb (78.9 kg)   SpO2 96%   BMI 27.25 kg/m  Physical  Exam Constitutional:      Appearance: Normal appearance.  Cardiovascular:     Rate and Rhythm: Normal rate and regular rhythm.  Pulmonary:     Effort: Pulmonary effort is normal.     Breath sounds: Normal breath sounds.  Musculoskeletal:        General: Normal range of motion.  Skin:    General: Skin is warm and dry.  Neurological:     General: No focal deficit present.     Mental Status: He is alert and oriented to person, place, and time. Mental status is at baseline.  Psychiatric:        Mood and Affect: Mood normal.        Behavior: Behavior normal.        Thought Content: Thought content normal.        Judgment: Judgment normal.      Lab  Results:  CBC    Component Value Date/Time   WBC 7.4 03/17/2022 1616   RBC 4.49 03/17/2022 1616   HGB 13.3 03/17/2022 1616   HCT 40.0 03/17/2022 1616   PLT 195.0 03/17/2022 1616   MCV 89.0 03/17/2022 1616   MCH 29.4 08/30/2020 0845   MCHC 33.3 03/17/2022 1616   RDW 13.4 03/17/2022 1616   LYMPHSABS 1.8 03/17/2022 1616   MONOABS 0.6 03/17/2022 1616   EOSABS 0.3 03/17/2022 1616   BASOSABS 0.2 (H) 03/17/2022 1616    BMET    Component Value Date/Time   NA 138 08/26/2022 1159   NA 140 05/23/2019 0000   K 4.3 08/26/2022 1159   CL 104 08/26/2022 1159   CO2 27 08/26/2022 1159   GLUCOSE 120 (H) 08/26/2022 1159   BUN 29 (H) 08/26/2022 1159   BUN 17 05/23/2019 0000   CREATININE 1.49 08/26/2022 1159   CREATININE 1.57 (H) 08/30/2020 0845   CALCIUM 9.3 08/26/2022 1159   GFRNONAA 41 (L) 08/30/2020 0845   GFRAA 47 (L) 08/30/2020 0845    BNP No results found for: "BNP"  ProBNP    Component Value Date/Time   PROBNP 77.0 10/20/2012 1608    Imaging: No results found.   Assessment & Plan:   Obstructive sleep apnea - Well controlled; Patient has severe obstructive sleep apnea. He has snoring symptoms at baseline. His last sleep study was done in 2014, AHI 14/hour. He is intolerant to CPAP. He has been wearing oral appliance  nightly for management of his OSA for 15 years. Sleeping well. No significant daytime sleepiness. He is established with Dr. Myrtis Ser, needs script for new oral appliance. Follow-up as needed.    Glenford Bayley, NP 02/23/2023

## 2023-02-23 NOTE — Patient Instructions (Addendum)
Will send to Dr. Myrtis Ser a prescription for you to receive new oral appliance  Follow-up As needed  Sleep Apnea Sleep apnea affects breathing during sleep. It causes breathing to stop for 10 seconds or more, or to become shallow. People with sleep apnea usually snore loudly. It can also increase the risk of: Heart attack. Stroke. Being very overweight (obese). Diabetes. Heart failure. Irregular heartbeat. High blood pressure. The goal of treatment is to help you breathe normally again. What are the causes?  The most common cause of this condition is a collapsed or blocked airway. There are three kinds of sleep apnea: Obstructive sleep apnea. This is caused by a blocked or collapsed airway. Central sleep apnea. This happens when the brain does not send the right signals to the muscles that control breathing. Mixed sleep apnea. This is a combination of obstructive and central sleep apnea. What increases the risk? Being overweight. Smoking. Having a small airway. Being older. Being male. Drinking alcohol. Taking medicines to calm yourself (sedatives or tranquilizers). Having family members with the condition. Having a tongue or tonsils that are larger than normal. What are the signs or symptoms? Trouble staying asleep. Loud snoring. Headaches in the morning. Waking up gasping. Dry mouth or sore throat in the morning. Being sleepy or tired during the day. If you are sleepy or tired during the day, you may also: Not be able to focus your mind (concentrate). Forget things. Get angry a lot and have mood swings. Feel sad (depressed). Have changes in your personality. Have less interest in sex, if you are male. Be unable to have an erection, if you are male. How is this treated?  Sleeping on your side. Using a medicine to get rid of mucus in your nose (decongestant). Avoiding the use of alcohol, medicines to help you relax, or certain pain medicines (narcotics). Losing  weight, if needed. Changing your diet. Quitting smoking. Using a machine to open your airway while you sleep, such as: An oral appliance. This is a mouthpiece that shifts your lower jaw forward. A CPAP device. This device blows air through a mask when you breathe out (exhale). An EPAP device. This has valves that you put in each nostril. A BIPAP device. This device blows air through a mask when you breathe in (inhale) and breathe out. Having surgery if other treatments do not work. Follow these instructions at home: Lifestyle Make changes that your doctor recommends. Eat a healthy diet. Lose weight if needed. Avoid alcohol, medicines to help you relax, and some pain medicines. Do not smoke or use any products that contain nicotine or tobacco. If you need help quitting, ask your doctor. General instructions Take over-the-counter and prescription medicines only as told by your doctor. If you were given a machine to use while you sleep, use it only as told by your doctor. If you are having surgery, make sure to tell your doctor you have sleep apnea. You may need to bring your device with you. Keep all follow-up visits. Contact a doctor if: The machine that you were given to use during sleep bothers you or does not seem to be working. You do not get better. You get worse. Get help right away if: Your chest hurts. You have trouble breathing in enough air. You have an uncomfortable feeling in your back, arms, or stomach. You have trouble talking. One side of your body feels weak. A part of your face is hanging down. These symptoms may be an emergency. Get  help right away. Call your local emergency services (911 in the [place] Do not wait to see if the symptoms will go away. Do not drive yourself to the hospital. Summary This condition affects breathing during sleep. The most common cause is a collapsed or blocked airway. The goal of treatment is to help you breathe normally while you  sleep. This information is not intended to replace advice given to you by your health care provider. Make sure you discuss any questions you have with your health care provider. Document Revised: 06/04/2021 Document Reviewed: 10/04/2020 Elsevier Patient Education  2023 [employer].

## 2023-03-03 ENCOUNTER — Ambulatory Visit (INDEPENDENT_AMBULATORY_CARE_PROVIDER_SITE_OTHER): Payer: Medicare Other | Admitting: Adult Health

## 2023-03-03 VITALS — BP 110/70 | HR 72 | Temp 97.6°F | Ht 67.0 in | Wt 172.0 lb

## 2023-03-03 DIAGNOSIS — R5383 Other fatigue: Secondary | ICD-10-CM

## 2023-03-03 DIAGNOSIS — E782 Mixed hyperlipidemia: Secondary | ICD-10-CM

## 2023-03-03 DIAGNOSIS — E559 Vitamin D deficiency, unspecified: Secondary | ICD-10-CM

## 2023-03-03 DIAGNOSIS — I10 Essential (primary) hypertension: Secondary | ICD-10-CM | POA: Diagnosis not present

## 2023-03-03 DIAGNOSIS — E118 Type 2 diabetes mellitus with unspecified complications: Secondary | ICD-10-CM | POA: Diagnosis not present

## 2023-03-03 DIAGNOSIS — I2510 Atherosclerotic heart disease of native coronary artery without angina pectoris: Secondary | ICD-10-CM | POA: Diagnosis not present

## 2023-03-03 DIAGNOSIS — G8929 Other chronic pain: Secondary | ICD-10-CM

## 2023-03-03 DIAGNOSIS — M25511 Pain in right shoulder: Secondary | ICD-10-CM

## 2023-03-03 DIAGNOSIS — M25512 Pain in left shoulder: Secondary | ICD-10-CM

## 2023-03-03 DIAGNOSIS — F5104 Psychophysiologic insomnia: Secondary | ICD-10-CM | POA: Diagnosis not present

## 2023-03-03 DIAGNOSIS — E611 Iron deficiency: Secondary | ICD-10-CM | POA: Diagnosis not present

## 2023-03-03 MED ORDER — METHYLPREDNISOLONE ACETATE 80 MG/ML IJ SUSP
80.0000 mg | Freq: Once | INTRAMUSCULAR | Status: AC
Start: 2023-03-03 — End: 2023-03-03
  Administered 2023-03-03: 80 mg via INTRA_ARTICULAR

## 2023-03-03 NOTE — Progress Notes (Signed)
Subjective:    Patient ID: Ian [name], male    DOB: May 15, 1941, 82 y.o.   MRN: [id number]  HPI He presents to the office today for follow-up regarding his chronic disease management.  He is getting ready to leave for the summer to go to his home in [place].  He wants to make sure his health is on track and have blood work done.  He has a history of diabetes mellitus type 2 with CKD stage 3a which is currently managed with farxiga 10 mg daily,  glipizide 2.5 mg XR daily.  He has not had any episodes of hypoglycemia.  He does check his blood sugars periodically with readings in the 120s to 140s.  He does have diabetic neuropathy for which she takes gabapentin 300 to 600 mg nightly. He had his A1c checked in December 2023 at his Nephrologist appointment with a reading of 6.4 Lab Results  Component Value Date   HGBA1C 7.4 (H) 08/26/2022   Due to history of hypertension he takes Benicar 20 mg daily.  His blood pressures have been well controlled  BP Readings from Last 3 Encounters:  02/23/23 112/62  12/01/22 (!) 128/50  11/20/22 128/70    Additionally he has a history of hyperlipidemia/CAD and his seen by cardiology in [place] as well as [place].  He takes Crestor 40 mg daily and ASA 81 mg daily.   Denies myalgia or fatigue with this medication Lab Results  Component Value Date   CHOL 146 03/17/2022   HDL 50.80 03/17/2022   LDLCALC 77 03/17/2022   TRIG 91.0 03/17/2022   CHOLHDL 3 03/17/2022   For insomnia you have been well controlled on Ambien 5 mg nightly  BPH he takes Flomax 0.4 mg daily and this dose his symptoms are well controlled.  Vitamin D deficiency - takes 10,000 units weekly.  Last vitamin D Lab Results  Component Value Date   VD25OH 39.63 12/26/2020    Furthermore he repots worsening fatigue. He has had workups in the past for fatigue which did not show anything concerning. He reports that over the last few months the fatigue has been worse. He partially  contributes this to sleepless night ( he has a history of OSA and wears an oral appliance) but as of lately he has been having worsening bilateral shoulder pain. He has known arthritis in right shoulder. He had a steroid injection in his right shoulder back in 09/2021 and responded well to this. He has not been as active as he would like due to the pain    Review of Systems  Constitutional:  Positive for fatigue.  HENT: Negative.    Eyes: Negative.   Respiratory: Negative.    Cardiovascular: Negative.   Gastrointestinal: Negative.   Endocrine: Negative.   Genitourinary: Negative.   Musculoskeletal:  Positive for arthralgias.  Skin: Negative.   Allergic/Immunologic: Negative.   Neurological: Negative.   Hematological: Negative.   Psychiatric/Behavioral: Negative.    All other systems reviewed and are negative.  Past Medical History:  Diagnosis Date   Allergy    Apnea, sleep 07/28/07   NPSG [place], AHI 79.9   Carotid artery stenosis    mild 04/2010, 05/2012   Coronary heart disease 2008   Cath 2008, 60-70% proximal diagonal 1 stenosis,  RCA 40% stenosis.  nonischemic Lexiscan 03/2011   Diabetes mellitus    type II   GERD (gastroesophageal reflux disease)    Hyperlipidemia    Hypertension    Hypospadias  Social History   Socioeconomic History   Marital status: Married    Spouse name: Not on file   Number of children: 3   Years of education: Not on file   Highest education level: Master's degree (e.g., MA, MS, MEng, MEd, MSW, MBA)  Occupational History   Occupation: Retired Economist: RETIRED  Tobacco Use   Smoking status: Never   Smokeless tobacco: Never  Substance and Sexual Activity   Alcohol use: No    Alcohol/week: 0.0 standard drinks of alcohol   Drug use: No   Sexual activity: Not on file  Other Topics Concern   Not on file  Social History Narrative   Married.  Three children from first marriage.  First wife died of ovarian cancer.      Retired, did work for [employer] for 44 years.    Social Determinants of Health   Financial Resource Strain: Low Risk  (03/03/2023)   Overall Financial Resource Strain (CARDIA)    Difficulty of Paying Living Expenses: Not hard at all  Food Insecurity: No Food Insecurity (03/03/2023)   Hunger Vital Sign    Worried About Running Out of Food in the Last Year: Never true    Ran Out of Food in the Last Year: Never true  Transportation Needs: No Transportation Needs (03/03/2023)   PRAPARE - Administrator, Civil Service (Medical): No    Lack of Transportation (Non-Medical): No  Physical Activity: Insufficiently Active (03/03/2023)   Exercise Vital Sign    Days of Exercise per Week: 3 days    Minutes of Exercise per Session: 30 min  Stress: Stress Concern Present (03/03/2023)   [employer] of Occupational Health - Occupational Stress Questionnaire    Feeling of Stress : To some extent  Social Connections: Socially Integrated (03/03/2023)   Social Connection and Isolation Panel [NHANES]    Frequency of Communication with Friends and Family: Twice a week    Frequency of Social Gatherings with Friends and Family: Once a week    Attends Religious Services: More than 4 times per year    Active Member of [employer] or Organizations: Yes    Attends Engineer, structural: More than 4 times per year    Marital Status: Married  Catering manager Violence: Not At Risk (12/26/2021)   Humiliation, Afraid, Rape, and Kick questionnaire    Fear of Current or Ex-Partner: No    Emotionally Abused: No    Physically Abused: No    Sexually Abused: No    Past Surgical History:  Procedure Laterality Date   EYE SURGERY     NASAL SINUS SURGERY     NISSEN FUNDOPLICATION     TONSILLECTOMY     VEIN LIGATION AND STRIPPING      Family History  Problem Relation Age of Onset   Coronary artery disease Father 60       CABG   Dementia Mother     Allergies  Allergen Reactions   Iohexol       Desc: pt. states severe reaction, dr. told patient never to have the dye again     Current Outpatient Medications on File Prior to Visit  Medication Sig Dispense Refill   aspirin 81 MG tablet Take 1 tablet (81 mg total) by mouth daily. 90 tablet 3   augmented betamethasone dipropionate (DIPROLENE-AF) 0.05 % ointment Apply topically.     betamethasone dipropionate 0.05 % cream Apply topically 2 (two) times daily.  Cholecalciferol (VITAMIN D3 PO) Take 10,000 Units by mouth once a week.     clotrimazole-betamethasone (LOTRISONE) cream Apply topically.     Coenzyme Q10 (CO Q10) 100 MG CAPS Take 1 tablet by mouth daily.     econazole nitrate 1 % cream Apply topically daily. 15 g 2   fexofenadine (ALLEGRA) 180 MG tablet Take 1 tablet (180 mg total) by mouth daily. 90 tablet 3   fluticasone (FLONASE) 50 MCG/ACT nasal spray USE 2 SPRAYS IN EACH NOSTRIL DAILY 48 g 3   gabapentin (NEURONTIN) 300 MG capsule TAKE 1 TO 2 CAPSULES IN THE EVENING 180 capsule 3   glipiZIDE (GLUCOTROL XL) 2.5 MG 24 hr tablet TAKE 1 TABLET DAILY WITH BREAKFAST 90 tablet 0   GLUCOSAMINE-CHONDROITIN PO Take by mouth.     hydrocortisone 2.5 % cream Apply topically 2 (two) times daily. 453.6 g 0   Lancets (ONETOUCH ULTRASOFT) lancets USE TO TEST BLOOD GLUCOSE TWICE DAILY 200 each 3   miconazole (MICOTIN) 2 % cream Apply 1 Application topically 2 (two) times daily. 28.35 g 0   nitroGLYCERIN (NITROSTAT) 0.4 MG SL tablet DISSOLVE 1 TABLET UNDER THE TONGUE EVERY 5 MINUTES AS NEEDED FOR CHEST PAIN 75 tablet 14   olmesartan (BENICAR) 20 MG tablet TAKE 1 TABLET DAILY 90 tablet 3   Omega-3 Fatty Acids (FISH OIL ULTRA) 1400 MG CAPS Take by mouth.     sildenafil (VIAGRA) 25 MG tablet Take 1 tablet (25 mg total) by mouth daily as needed for erectile dysfunction. 20 tablet 3   SYSTANE ULTRA 0.4-0.3 % SOLN      tamsulosin (FLOMAX) 0.4 MG CAPS capsule TAKE 1 CAPSULE DAILY 90 capsule 3   terbinafine (LAMISIL) 250 MG tablet Take 1  tablet (250 mg total) by mouth daily. 30 tablet 0   traMADol (ULTRAM) 50 MG tablet TAKE 1 TABLET EVERY 8 HOURS AS NEEDED 90 tablet 2   zolpidem (AMBIEN) 5 MG tablet TAKE 1 TABLET AT BEDTIME AS NEEDED FOR SLEEP 90 tablet 0   rosuvastatin (CRESTOR) 40 MG tablet Take 1 tablet (40 mg total) by mouth daily. 90 tablet 3   No current facility-administered medications on file prior to visit.    BP 110/70   Pulse 72   Temp 97.6 F (36.4 C)   Ht 5\' 7"  (1.702 m)   Wt 172 lb (78 kg)   SpO2 98%   BMI 26.94 kg/m       Objective:   Physical Exam Vitals and nursing note reviewed.  Cardiovascular:     Rate and Rhythm: Normal rate and regular rhythm.     Pulses: Normal pulses.     Heart sounds: Normal heart sounds.  Pulmonary:     Effort: Pulmonary effort is normal.     Breath sounds: Normal breath sounds.  Musculoskeletal:     Right shoulder: Bony tenderness and crepitus present. Decreased range of motion. Normal strength. Normal pulse.     Left shoulder: Bony tenderness and crepitus present. Decreased range of motion. Normal strength. Normal pulse.  Skin:    General: Skin is warm and dry.     Capillary Refill: Capillary refill takes less than 2 seconds.  Neurological:     General: No focal deficit present.     Mental Status: He is oriented to person, place, and time.  Psychiatric:        Mood and Affect: Mood normal.        Behavior: Behavior normal.  Thought Content: Thought content normal.        Judgment: Judgment normal.       Assessment & Plan:   1. Mixed hyperlipidemia - consider increase in statin  - Lipid panel; Future - TSH; Future - CBC; Future - Comprehensive metabolic panel; Future - IBC + Ferritin; Future - VITAMIN D 25 Hydroxy (Vit-D Deficiency, Fractures); Future  2. Type 2 diabetes mellitus with complication, without long-term current use of insulin - Consider adding agent  - Lipid panel; Future - TSH; Future - CBC; Future - Comprehensive metabolic  panel; Future - IBC + Ferritin; Future - VITAMIN D 25 Hydroxy (Vit-D Deficiency, Fractures); Future  3. Essential hypertension - Well controlled. No change in medication  - Lipid panel; Future - TSH; Future - CBC; Future - Comprehensive metabolic panel; Future - IBC + Ferritin; Future - VITAMIN D 25 Hydroxy (Vit-D Deficiency, Fractures); Future  4. Chronic insomnia - continue with Ambien  - Lipid panel; Future - TSH; Future - CBC; Future - Comprehensive metabolic panel; Future - IBC + Ferritin; Future - VITAMIN D 25 Hydroxy (Vit-D Deficiency, Fractures); Future  5. Coronary artery disease involving native coronary artery of native heart without angina pectoris - Continue statin and ASA - Lipid panel; Future - TSH; Future - CBC; Future - Comprehensive metabolic panel; Future - IBC + Ferritin; Future - VITAMIN D 25 Hydroxy (Vit-D Deficiency, Fractures); Future  6. Other fatigue - unknown cause but likely multifactorial relating to age, inactivity,and chronic pain  - Lipid panel; Future - TSH; Future - CBC; Future - Comprehensive metabolic panel; Future - IBC + Ferritin; Future - VITAMIN D 25 Hydroxy (Vit-D Deficiency, Fractures); Future  7. Vitamin D deficiency  - VITAMIN D 25 Hydroxy (Vit-D Deficiency, Fractures); Future  8. Iron deficiency  - IBC + Ferritin; Future  9. Chronic right shoulder pain Shoulder injection Verbal consent obtained and verified. Sterile betadine prep. Furthur cleansed with alcohol. Topical analgesic spray: Ethyl chloride. Joint: right subacromial injection Approached in typical fashion with: posterior approach Completed without difficulty Meds: 3 cc lidocaine 2% no epi, 1 cc depomedrol 80mg /cc Needle:1.5 inch 25 gauge Aftercare instructions and Red flags advised. Immediate improvement in pain noted  - methylPREDNISolone acetate (DEPO-MEDROL) injection 80 mg - Advised to increase water consumption as steroid injections will likely  cause elevation in blood sugar  10. Chronic left shoulder pain Shoulder injection Verbal consent obtained and verified. Sterile betadine prep. Furthur cleansed with alcohol. Topical analgesic spray: Ethyl chloride. Joint: left subacromial injection Approached in typical fashion with: posterior approach Completed without difficulty Meds: 3 cc lidocaine 2% no epi, 1 cc depomedrol 80mg /cc Needle:1.5 inch 25 gauge Aftercare instructions and Red flags advised. Immediate improvement in pain noted  - methylPREDNISolone acetate (DEPO-MEDROL) injection 80 mg  Shirline Frees, NP

## 2023-03-05 ENCOUNTER — Other Ambulatory Visit (INDEPENDENT_AMBULATORY_CARE_PROVIDER_SITE_OTHER): Payer: Medicare Other

## 2023-03-05 DIAGNOSIS — I10 Essential (primary) hypertension: Secondary | ICD-10-CM

## 2023-03-05 DIAGNOSIS — F5104 Psychophysiologic insomnia: Secondary | ICD-10-CM | POA: Diagnosis not present

## 2023-03-05 DIAGNOSIS — E118 Type 2 diabetes mellitus with unspecified complications: Secondary | ICD-10-CM | POA: Diagnosis not present

## 2023-03-05 DIAGNOSIS — I2510 Atherosclerotic heart disease of native coronary artery without angina pectoris: Secondary | ICD-10-CM

## 2023-03-05 DIAGNOSIS — R5383 Other fatigue: Secondary | ICD-10-CM | POA: Diagnosis not present

## 2023-03-05 DIAGNOSIS — E559 Vitamin D deficiency, unspecified: Secondary | ICD-10-CM

## 2023-03-05 DIAGNOSIS — E782 Mixed hyperlipidemia: Secondary | ICD-10-CM

## 2023-03-05 DIAGNOSIS — E611 Iron deficiency: Secondary | ICD-10-CM | POA: Diagnosis not present

## 2023-03-05 LAB — CBC
HCT: 46.5 % (ref 39.0–52.0)
Hemoglobin: 15.6 g/dL (ref 13.0–17.0)
MCHC: 33.5 g/dL (ref 30.0–36.0)
MCV: 88.9 fl (ref 78.0–100.0)
Platelets: 220 K/uL (ref 150.0–400.0)
RBC: 5.22 Mil/uL (ref 4.22–5.81)
RDW: 13.9 % (ref 11.5–15.5)
WBC: 8.1 K/uL (ref 4.0–10.5)

## 2023-03-05 LAB — IBC + FERRITIN
Ferritin: 73.9 ng/mL (ref 22.0–322.0)
Iron: 82 ug/dL (ref 42–165)
Saturation Ratios: 24.9 % (ref 20.0–50.0)
TIBC: 329 ug/dL (ref 250.0–450.0)
Transferrin: 235 mg/dL (ref 212.0–360.0)

## 2023-03-05 LAB — LIPID PANEL
Cholesterol: 134 mg/dL (ref 0–200)
HDL: 60.2 mg/dL
LDL Cholesterol: 60 mg/dL (ref 0–99)
NonHDL: 73.77
Total CHOL/HDL Ratio: 2
Triglycerides: 69 mg/dL (ref 0.0–149.0)
VLDL: 13.8 mg/dL (ref 0.0–40.0)

## 2023-03-05 LAB — COMPREHENSIVE METABOLIC PANEL
ALT: 27 U/L (ref 0–53)
AST: 30 U/L (ref 0–37)
Albumin: 4.2 g/dL (ref 3.5–5.2)
Alkaline Phosphatase: 43 U/L (ref 39–117)
BUN: 43 mg/dL — ABNORMAL HIGH (ref 6–23)
CO2: 21 mEq/L (ref 19–32)
Calcium: 9.3 mg/dL (ref 8.4–10.5)
Chloride: 102 mEq/L (ref 96–112)
Creatinine, Ser: 1.61 mg/dL — ABNORMAL HIGH (ref 0.40–1.50)
GFR: 39.23 mL/min — ABNORMAL LOW (ref >60.00)
Glucose, Bld: 102 mg/dL — ABNORMAL HIGH (ref 70–99)
Potassium: 4.3 mEq/L (ref 3.5–5.1)
Sodium: 134 mEq/L — ABNORMAL LOW (ref 135–145)
Total Bilirubin: 1.2 mg/dL (ref 0.2–1.2)
Total Protein: 7.2 g/dL (ref 6.0–8.3)

## 2023-03-05 LAB — TSH: TSH: 0.89 u[IU]/mL (ref 0.35–5.50)

## 2023-03-05 LAB — VITAMIN D 25 HYDROXY (VIT D DEFICIENCY, FRACTURES): VITD: 54.09 ng/mL (ref 30.00–100.00)

## 2023-03-08 DIAGNOSIS — R799 Abnormal finding of blood chemistry, unspecified: Secondary | ICD-10-CM | POA: Diagnosis not present

## 2023-03-08 DIAGNOSIS — N1831 Chronic kidney disease, stage 3a: Secondary | ICD-10-CM | POA: Diagnosis not present

## 2023-03-09 ENCOUNTER — Telehealth: Payer: Self-pay | Admitting: Adult Health

## 2023-03-09 NOTE — Telephone Encounter (Signed)
Updated patient on his labs  

## 2023-03-11 DIAGNOSIS — N1831 Chronic kidney disease, stage 3a: Secondary | ICD-10-CM | POA: Diagnosis not present

## 2023-03-11 DIAGNOSIS — E785 Hyperlipidemia, unspecified: Secondary | ICD-10-CM | POA: Diagnosis not present

## 2023-03-11 DIAGNOSIS — I129 Hypertensive chronic kidney disease with stage 1 through stage 4 chronic kidney disease, or unspecified chronic kidney disease: Secondary | ICD-10-CM | POA: Diagnosis not present

## 2023-03-11 DIAGNOSIS — E1122 Type 2 diabetes mellitus with diabetic chronic kidney disease: Secondary | ICD-10-CM | POA: Diagnosis not present

## 2023-03-11 DIAGNOSIS — D631 Anemia in chronic kidney disease: Secondary | ICD-10-CM | POA: Diagnosis not present

## 2023-03-11 DIAGNOSIS — N2581 Secondary hyperparathyroidism of renal origin: Secondary | ICD-10-CM | POA: Diagnosis not present

## 2023-03-16 DIAGNOSIS — H40013 Open angle with borderline findings, low risk, bilateral: Secondary | ICD-10-CM | POA: Diagnosis not present

## 2023-03-28 ENCOUNTER — Other Ambulatory Visit: Payer: Self-pay | Admitting: Adult Health

## 2023-03-28 DIAGNOSIS — Z760 Encounter for issue of repeat prescription: Secondary | ICD-10-CM

## 2023-03-30 NOTE — Telephone Encounter (Signed)
Okay for refill?  

## 2023-04-02 ENCOUNTER — Encounter: Payer: Self-pay | Admitting: Adult Health

## 2023-04-02 ENCOUNTER — Telehealth: Payer: Self-pay | Admitting: Adult Health

## 2023-04-02 ENCOUNTER — Encounter: Payer: Self-pay | Admitting: Cardiology

## 2023-04-02 DIAGNOSIS — N401 Enlarged prostate with lower urinary tract symptoms: Secondary | ICD-10-CM

## 2023-04-02 MED ORDER — TAMSULOSIN HCL 0.4 MG PO CAPS
0.4000 mg | ORAL_CAPSULE | Freq: Every day | ORAL | 3 refills | Status: DC
Start: 2023-04-02 — End: 2024-02-11

## 2023-04-02 NOTE — Telephone Encounter (Signed)
Prescription Request  04/02/2023  LOV: 03/03/2023  What is the name of the medication or equipment? tamsulosin (FLOMAX) 0.4 MG CAPS capsule   Have you contacted your pharmacy to request a refill? Yes   Which pharmacy would you like this sent to?   [facility] - [place], [place] - [address] [place] [address] Phone: [phone] Fax: [phone]   Patient notified that their request is being sent to the clinical staff for review and that they should receive a response within 2 business days.   Please advise at Mobile [phone] (mobile)

## 2023-04-02 NOTE — Telephone Encounter (Signed)
Rx sent 

## 2023-04-06 ENCOUNTER — Ambulatory Visit: Payer: Medicare Other | Admitting: Adult Health

## 2023-04-06 ENCOUNTER — Ambulatory Visit (INDEPENDENT_AMBULATORY_CARE_PROVIDER_SITE_OTHER): Payer: Medicare Other | Admitting: Adult Health

## 2023-04-06 ENCOUNTER — Encounter: Payer: Self-pay | Admitting: Adult Health

## 2023-04-06 VITALS — BP 120/60 | HR 66 | Temp 98.0°F | Ht 67.0 in | Wt 167.2 lb

## 2023-04-06 DIAGNOSIS — M25511 Pain in right shoulder: Secondary | ICD-10-CM | POA: Diagnosis not present

## 2023-04-06 DIAGNOSIS — G8929 Other chronic pain: Secondary | ICD-10-CM | POA: Diagnosis not present

## 2023-04-06 MED ORDER — METHYLPREDNISOLONE ACETATE 80 MG/ML IJ SUSP
80.0000 mg | Freq: Once | INTRAMUSCULAR | Status: AC
Start: 2023-04-06 — End: 2023-04-06
  Administered 2023-04-06: 80 mg via INTRA_ARTICULAR

## 2023-04-06 NOTE — Progress Notes (Signed)
Subjective:    Patient ID: Ian [name], male    DOB: [birth year], 82 y.o.   MRN: [id number]  HPI He presents to the office today for chronic right shoulder pain. About a month ago he had a steroid injection in the left shoulder and right knee and it took care of about 90 percent of his pain. He is leaving tomorrow to go to his [place] in [place] for the summer and is wondering if he can have a steroid injection in his right shoulder.     Review of Systems See HPI   Past Medical History:  Diagnosis Date   Allergy    Apnea, sleep 07/28/07   NPSG [place], AHI 79.9   Carotid artery stenosis    mild 04/2010, 05/2012   Coronary heart disease 2008   Cath 2008, 60-70% proximal diagonal 1 stenosis,  RCA 40% stenosis.  nonischemic Lexiscan 03/2011   Diabetes mellitus    type II   GERD (gastroesophageal reflux disease)    Hyperlipidemia    Hypertension    Hypospadias     Social History   Socioeconomic History   Marital status: Married    Spouse name: Not on file   Number of children: 3   Years of education: Not on file   Highest education level: Master's degree (e.g., MA, MS, MEng, MEd, MSW, MBA)  Occupational History   Occupation: Retired Economist: RETIRED  Tobacco Use   Smoking status: Never   Smokeless tobacco: Never  Substance and Sexual Activity   Alcohol use: No    Alcohol/week: 0.0 standard drinks of alcohol   Drug use: No   Sexual activity: Not on file  Other Topics Concern   Not on file  Social History Narrative   Married.  Three children from first marriage.  First wife died of ovarian cancer.     Retired, did work for [employer] for 44 years.    Social Determinants of Health   Financial Resource Strain: Low Risk  (03/03/2023)   Overall Financial Resource Strain (CARDIA)    Difficulty of Paying Living Expenses: Not hard at all  Food Insecurity: No Food Insecurity (03/03/2023)   Hunger Vital Sign    Worried About Running Out of Food  in the Last Year: Never true    Ran Out of Food in the Last Year: Never true  Transportation Needs: No Transportation Needs (03/03/2023)   PRAPARE - Administrator, Civil Service (Medical): No    Lack of Transportation (Non-Medical): No  Physical Activity: Insufficiently Active (03/03/2023)   Exercise Vital Sign    Days of Exercise per Week: 3 days    Minutes of Exercise per Session: 30 min  Stress: Stress Concern Present (03/03/2023)   [employer] of Occupational Health - Occupational Stress Questionnaire    Feeling of Stress : To some extent  Social Connections: Socially Integrated (03/03/2023)   Social Connection and Isolation Panel [NHANES]    Frequency of Communication with Friends and Family: Twice a week    Frequency of Social Gatherings with Friends and Family: Once a week    Attends Religious Services: More than 4 times per year    Active Member of [employer] or Organizations: Yes    Attends Banker Meetings: More than 4 times per year    Marital Status: Married  Catering manager Violence: Not At Risk (12/26/2021)   Humiliation, Afraid, Rape, and Kick questionnaire    Fear  of Current or Ex-Partner: No    Emotionally Abused: No    Physically Abused: No    Sexually Abused: No    Past Surgical History:  Procedure Laterality Date   EYE SURGERY     NASAL SINUS SURGERY     NISSEN FUNDOPLICATION     TONSILLECTOMY     VEIN LIGATION AND STRIPPING      Family History  Problem Relation Age of Onset   Coronary artery disease Father 69       CABG   Dementia Mother     Allergies  Allergen Reactions   Iohexol      Desc: pt. states severe reaction, dr. told patient never to have the dye again     Current Outpatient Medications on File Prior to Visit  Medication Sig Dispense Refill   aspirin 81 MG tablet Take 1 tablet (81 mg total) by mouth daily. 90 tablet 3   augmented betamethasone dipropionate (DIPROLENE-AF) 0.05 % ointment Apply topically.      betamethasone dipropionate 0.05 % cream Apply topically 2 (two) times daily.     Cholecalciferol (VITAMIN D3 PO) Take 10,000 Units by mouth once a week.     clotrimazole-betamethasone (LOTRISONE) cream Apply topically.     Coenzyme Q10 (CO Q10) 100 MG CAPS Take 1 tablet by mouth daily.     econazole nitrate 1 % cream Apply topically daily. 15 g 2   fexofenadine (ALLEGRA) 180 MG tablet Take 1 tablet (180 mg total) by mouth daily. 90 tablet 3   fluticasone (FLONASE) 50 MCG/ACT nasal spray USE 2 SPRAYS IN EACH NOSTRIL DAILY 48 g 3   gabapentin (NEURONTIN) 300 MG capsule TAKE 1 TO 2 CAPSULES IN THE EVENING 180 capsule 3   glipiZIDE (GLUCOTROL XL) 2.5 MG 24 hr tablet TAKE 1 TABLET DAILY WITH BREAKFAST 90 tablet 0   GLUCOSAMINE-CHONDROITIN PO Take by mouth.     hydrocortisone 2.5 % cream Apply topically 2 (two) times daily. 453.6 g 0   Lancets (ONETOUCH ULTRASOFT) lancets USE TO TEST BLOOD GLUCOSE TWICE DAILY 200 each 3   miconazole (MICOTIN) 2 % cream Apply 1 Application topically 2 (two) times daily. 28.35 g 0   nitroGLYCERIN (NITROSTAT) 0.4 MG SL tablet DISSOLVE 1 TABLET UNDER THE TONGUE EVERY 5 MINUTES AS NEEDED FOR CHEST PAIN 75 tablet 14   olmesartan (BENICAR) 20 MG tablet TAKE 1 TABLET DAILY 90 tablet 3   Omega-3 Fatty Acids (FISH OIL ULTRA) 1400 MG CAPS Take by mouth.     sildenafil (VIAGRA) 25 MG tablet Take 1 tablet (25 mg total) by mouth daily as needed for erectile dysfunction. 20 tablet 3   SYSTANE ULTRA 0.4-0.3 % SOLN      tamsulosin (FLOMAX) 0.4 MG CAPS capsule Take 1 capsule (0.4 mg total) by mouth daily. 90 capsule 3   terbinafine (LAMISIL) 250 MG tablet Take 1 tablet (250 mg total) by mouth daily. 30 tablet 0   traMADol (ULTRAM) 50 MG tablet TAKE 1 TABLET EVERY 8 HOURS AS NEEDED 90 tablet 2   zolpidem (AMBIEN) 5 MG tablet TAKE 1 TABLET AT BEDTIME AS NEEDED FOR SLEEP 90 tablet 0   rosuvastatin (CRESTOR) 40 MG tablet Take 1 tablet (40 mg total) by mouth daily. 90 tablet 3   No  current facility-administered medications on file prior to visit.    BP 120/60 (BP Location: Left Arm, Patient Position: Sitting, Cuff Size: Normal)   Pulse 66   Temp 98 F (36.7 C) (Oral)  Ht 5\' 7"  (1.702 m)   Wt 167 lb 3.2 oz (75.8 kg)   SpO2 99%   BMI 26.19 kg/m       Objective:   Physical Exam Vitals and nursing note reviewed.  Constitutional:      Appearance: Normal appearance.  Musculoskeletal:     Right shoulder: Tenderness and bony tenderness present. Decreased range of motion.  Skin:    General: Skin is warm and dry.  Neurological:     General: No focal deficit present.     Mental Status: He is alert and oriented to person, place, and time.  Psychiatric:        Mood and Affect: Mood normal.        Behavior: Behavior normal.        Thought Content: Thought content normal.        Judgment: Judgment normal.       Assessment & Plan:  1. Chronic right shoulder pain Joint Injection/Arthrocentesis  Date/Time: 04/06/2023 2:01 PM  Performed by: Shirline Frees, NP Authorized by: Shirline Frees, NP  Indications: pain  Body area: shoulder Joint: right shoulder Local anesthesia used: yes  Anesthesia: Local anesthesia used: yes Local Anesthetic: topical anesthetic and lidocaine 2% without epinephrine  Sedation: Patient sedated: no  Needle size: 20 G Ultrasound guidance: no Approach: posterior Methylprednisolone amount: 80 mg Lidocaine 2% amount: 2 mL Patient tolerance: patient tolerated the procedure well with no immediate complications     - methylPREDNISolone acetate (DEPO-MEDROL) injection 80 mg  Shirline Frees, NP

## 2023-04-07 ENCOUNTER — Other Ambulatory Visit: Payer: Self-pay | Admitting: Cardiology

## 2023-05-03 ENCOUNTER — Other Ambulatory Visit: Payer: Self-pay | Admitting: Adult Health

## 2023-05-03 DIAGNOSIS — E119 Type 2 diabetes mellitus without complications: Secondary | ICD-10-CM

## 2023-05-04 DIAGNOSIS — Z0000 Encounter for general adult medical examination without abnormal findings: Secondary | ICD-10-CM | POA: Diagnosis not present

## 2023-05-04 DIAGNOSIS — E119 Type 2 diabetes mellitus without complications: Secondary | ICD-10-CM | POA: Diagnosis not present

## 2023-05-04 DIAGNOSIS — Z125 Encounter for screening for malignant neoplasm of prostate: Secondary | ICD-10-CM | POA: Diagnosis not present

## 2023-05-17 ENCOUNTER — Other Ambulatory Visit: Payer: Self-pay | Admitting: Adult Health

## 2023-05-18 NOTE — Telephone Encounter (Signed)
Okay for refill?  

## 2023-05-19 DIAGNOSIS — I739 Peripheral vascular disease, unspecified: Secondary | ICD-10-CM | POA: Diagnosis not present

## 2023-05-19 DIAGNOSIS — E782 Mixed hyperlipidemia: Secondary | ICD-10-CM | POA: Diagnosis not present

## 2023-05-19 DIAGNOSIS — I2510 Atherosclerotic heart disease of native coronary artery without angina pectoris: Secondary | ICD-10-CM | POA: Diagnosis not present

## 2023-05-19 DIAGNOSIS — I10 Essential (primary) hypertension: Secondary | ICD-10-CM | POA: Diagnosis not present

## 2023-05-19 DIAGNOSIS — I6523 Occlusion and stenosis of bilateral carotid arteries: Secondary | ICD-10-CM | POA: Diagnosis not present

## 2023-06-21 DIAGNOSIS — M25512 Pain in left shoulder: Secondary | ICD-10-CM | POA: Diagnosis not present

## 2023-06-21 DIAGNOSIS — M25511 Pain in right shoulder: Secondary | ICD-10-CM | POA: Diagnosis not present

## 2023-06-21 DIAGNOSIS — E119 Type 2 diabetes mellitus without complications: Secondary | ICD-10-CM | POA: Diagnosis not present

## 2023-07-19 ENCOUNTER — Encounter: Payer: Self-pay | Admitting: Adult Health

## 2023-07-26 DIAGNOSIS — I129 Hypertensive chronic kidney disease with stage 1 through stage 4 chronic kidney disease, or unspecified chronic kidney disease: Secondary | ICD-10-CM | POA: Diagnosis not present

## 2023-07-26 DIAGNOSIS — E119 Type 2 diabetes mellitus without complications: Secondary | ICD-10-CM | POA: Diagnosis not present

## 2023-07-26 DIAGNOSIS — N183 Chronic kidney disease, stage 3 unspecified: Secondary | ICD-10-CM | POA: Diagnosis not present

## 2023-07-26 DIAGNOSIS — E1122 Type 2 diabetes mellitus with diabetic chronic kidney disease: Secondary | ICD-10-CM | POA: Diagnosis not present

## 2023-07-26 DIAGNOSIS — I10 Essential (primary) hypertension: Secondary | ICD-10-CM | POA: Diagnosis not present

## 2023-07-26 LAB — LAB REPORT - SCANNED
A1c: 6.5
Creatinine, POC: 75.6 mg/dL
EGFR: 42

## 2023-08-04 ENCOUNTER — Encounter: Payer: Self-pay | Admitting: Adult Health

## 2023-08-05 ENCOUNTER — Other Ambulatory Visit: Payer: Self-pay | Admitting: Adult Health

## 2023-08-05 DIAGNOSIS — Z760 Encounter for issue of repeat prescription: Secondary | ICD-10-CM

## 2023-08-05 NOTE — Telephone Encounter (Signed)
Okay for refill?  

## 2023-08-10 DIAGNOSIS — Z23 Encounter for immunization: Secondary | ICD-10-CM | POA: Diagnosis not present

## 2023-08-16 ENCOUNTER — Other Ambulatory Visit: Payer: Self-pay | Admitting: Adult Health

## 2023-09-20 DIAGNOSIS — N2581 Secondary hyperparathyroidism of renal origin: Secondary | ICD-10-CM | POA: Diagnosis not present

## 2023-09-20 DIAGNOSIS — N189 Chronic kidney disease, unspecified: Secondary | ICD-10-CM | POA: Diagnosis not present

## 2023-09-20 DIAGNOSIS — E1122 Type 2 diabetes mellitus with diabetic chronic kidney disease: Secondary | ICD-10-CM | POA: Diagnosis not present

## 2023-09-20 DIAGNOSIS — N1831 Chronic kidney disease, stage 3a: Secondary | ICD-10-CM | POA: Diagnosis not present

## 2023-09-21 DIAGNOSIS — H35033 Hypertensive retinopathy, bilateral: Secondary | ICD-10-CM | POA: Diagnosis not present

## 2023-09-21 DIAGNOSIS — H35373 Puckering of macula, bilateral: Secondary | ICD-10-CM | POA: Diagnosis not present

## 2023-09-21 DIAGNOSIS — H40013 Open angle with borderline findings, low risk, bilateral: Secondary | ICD-10-CM | POA: Diagnosis not present

## 2023-09-21 DIAGNOSIS — H524 Presbyopia: Secondary | ICD-10-CM | POA: Diagnosis not present

## 2023-09-21 DIAGNOSIS — E119 Type 2 diabetes mellitus without complications: Secondary | ICD-10-CM | POA: Diagnosis not present

## 2023-09-23 DIAGNOSIS — N2581 Secondary hyperparathyroidism of renal origin: Secondary | ICD-10-CM | POA: Diagnosis not present

## 2023-09-23 DIAGNOSIS — E1122 Type 2 diabetes mellitus with diabetic chronic kidney disease: Secondary | ICD-10-CM | POA: Diagnosis not present

## 2023-09-23 DIAGNOSIS — N1831 Chronic kidney disease, stage 3a: Secondary | ICD-10-CM | POA: Diagnosis not present

## 2023-09-23 DIAGNOSIS — I129 Hypertensive chronic kidney disease with stage 1 through stage 4 chronic kidney disease, or unspecified chronic kidney disease: Secondary | ICD-10-CM | POA: Diagnosis not present

## 2023-09-23 DIAGNOSIS — E785 Hyperlipidemia, unspecified: Secondary | ICD-10-CM | POA: Diagnosis not present

## 2023-10-01 ENCOUNTER — Ambulatory Visit (INDEPENDENT_AMBULATORY_CARE_PROVIDER_SITE_OTHER): Payer: Medicare Other | Admitting: Adult Health

## 2023-10-01 ENCOUNTER — Encounter: Payer: Self-pay | Admitting: Adult Health

## 2023-10-01 VITALS — BP 102/60 | HR 76 | Temp 97.0°F | Ht 67.0 in | Wt 171.0 lb

## 2023-10-01 DIAGNOSIS — M25562 Pain in left knee: Secondary | ICD-10-CM | POA: Diagnosis not present

## 2023-10-01 DIAGNOSIS — G8929 Other chronic pain: Secondary | ICD-10-CM

## 2023-10-01 DIAGNOSIS — M79621 Pain in right upper arm: Secondary | ICD-10-CM | POA: Diagnosis not present

## 2023-10-01 DIAGNOSIS — M25561 Pain in right knee: Secondary | ICD-10-CM

## 2023-10-01 MED ORDER — METHYLPREDNISOLONE ACETATE 80 MG/ML IJ SUSP
80.0000 mg | Freq: Once | INTRAMUSCULAR | Status: AC
  Administered 2023-10-01: 80 mg via INTRA_ARTICULAR

## 2023-10-01 MED ORDER — METHYLPREDNISOLONE ACETATE 40 MG/ML IJ SUSP: 40.0000 mg | Freq: Once | INTRAMUSCULAR | Status: DC

## 2023-10-01 NOTE — Patient Instructions (Signed)
Health Maintenance Due  Topic Date Due   Diabetic kidney evaluation - Urine ACR  10/15/2016   FOOT EXAM  12/03/2017   DTaP/Tdap/Td (2 - Tdap) 07/02/2019   HEMOGLOBIN A1C  02/25/2023   INFLUENZA VACCINE  06/10/2023   COVID-19 Vaccine (5 - 2023-24 season) 07/11/2023   OPHTHALMOLOGY EXAM  09/22/2023       12/01/2022   11:35 AM 12/26/2021   11:29 AM 10/15/2020   11:25 AM  Depression screen PHQ 2/9  Decreased Interest 0 0 0  Down, Depressed, Hopeless 0 0 0  PHQ - 2 Score 0 0 0

## 2023-10-01 NOTE — Progress Notes (Signed)
Subjective:    Patient ID: Ian [name], male    DOB: [birth year], 82 y.o.   MRN: [id number]  HPI  82 year old male who  has a past medical history of Allergy, Apnea, sleep (07/28/07), Carotid artery stenosis, Coronary heart disease (2008), Diabetes mellitus, GERD (gastroesophageal reflux disease), Hyperlipidemia, Hypertension, and Hypospadias.  He has history of bilateral chronic knee pain from osteoarthritis.  We have done steroid injections into his knees in the past and he has had wonderful results with reduction in knee pain.  The last time he did this was 11 months ago.  He would like to have his knees injected today as the pain is started to become bad again.  Furthermore he reports that while he was in [place] over the summer he was trying to start up a generator and pulled a muscle in his right upper arm that has not completely healed.  He does report that it has improved but he still has significant pain in his bicep with certain cushions.  He is wondering if he can go to physical therapy  Review of Systems See HPI   Past Medical History:  Diagnosis Date   Allergy    Apnea, sleep 07/28/07   NPSG [place], AHI 79.9   Carotid artery stenosis    mild 04/2010, 05/2012   Coronary heart disease 2008   Cath 2008, 60-70% proximal diagonal 1 stenosis,  RCA 40% stenosis.  nonischemic Lexiscan 03/2011   Diabetes mellitus    type II   GERD (gastroesophageal reflux disease)    Hyperlipidemia    Hypertension    Hypospadias     Social History   Socioeconomic History   Marital status: Married    Spouse name: Not on file   Number of children: 3   Years of education: Not on file   Highest education level: Master's degree (e.g., MA, MS, MEng, MEd, MSW, MBA)  Occupational History   Occupation: Retired Economist: RETIRED  Tobacco Use   Smoking status: Never   Smokeless tobacco: Never  Substance and Sexual Activity   Alcohol use: No    Alcohol/week: 0.0 standard  drinks of alcohol   Drug use: No   Sexual activity: Not on file  Other Topics Concern   Not on file  Social History Narrative   Married.  Three children from first marriage.  First wife died of ovarian cancer.     Retired, did work for [employer] for 44 years.    Social Determinants of Health   Financial Resource Strain: Low Risk  (03/03/2023)   Overall Financial Resource Strain (CARDIA)    Difficulty of Paying Living Expenses: Not hard at all  Food Insecurity: No Food Insecurity (03/03/2023)   Hunger Vital Sign    Worried About Running Out of Food in the Last Year: Never true    Ran Out of Food in the Last Year: Never true  Transportation Needs: No Transportation Needs (03/03/2023)   PRAPARE - Administrator, Civil Service (Medical): No    Lack of Transportation (Non-Medical): No  Physical Activity: Insufficiently Active (03/03/2023)   Exercise Vital Sign    Days of Exercise per Week: 3 days    Minutes of Exercise per Session: 30 min  Stress: Stress Concern Present (03/03/2023)   [employer] of Occupational Health - Occupational Stress Questionnaire    Feeling of Stress : To some extent  Social Connections: Socially Integrated (03/03/2023)   Social  Connection and Isolation Panel [NHANES]    Frequency of Communication with Friends and Family: Twice a week    Frequency of Social Gatherings with Friends and Family: Once a week    Attends Religious Services: More than 4 times per year    Active Member of [employer] or Organizations: Yes    Attends Engineer, structural: More than 4 times per year    Marital Status: Married  Catering manager Violence: Unknown (12/24/2022)   Received from [employer], [facility]   HITS    Physically Hurt: Not on file    Insult or Talk Down To: Not on file    Threaten Physical Harm: Not on file    Scream or Curse: Not on file    Past Surgical History:  Procedure Laterality Date   EYE SURGERY     NASAL SINUS SURGERY      NISSEN FUNDOPLICATION     TONSILLECTOMY     VEIN LIGATION AND STRIPPING      Family History  Problem Relation Age of Onset   Coronary artery disease Father 8       CABG   Dementia Mother     Allergies  Allergen Reactions   Iohexol      Desc: pt. states severe reaction, dr. told patient never to have the dye again     Current Outpatient Medications on File Prior to Visit  Medication Sig Dispense Refill   aspirin 81 MG tablet Take 1 tablet (81 mg total) by mouth daily. 90 tablet 3   augmented betamethasone dipropionate (DIPROLENE-AF) 0.05 % ointment Apply topically.     betamethasone dipropionate 0.05 % cream Apply topically 2 (two) times daily.     Cholecalciferol (VITAMIN D3 PO) Take 10,000 Units by mouth once a week.     clotrimazole-betamethasone (LOTRISONE) cream Apply topically.     Coenzyme Q10 (CO Q10) 100 MG CAPS Take 1 tablet by mouth daily.     econazole nitrate 1 % cream Apply topically daily. 15 g 2   fexofenadine (ALLEGRA) 180 MG tablet Take 1 tablet (180 mg total) by mouth daily. 90 tablet 3   fluticasone (FLONASE) 50 MCG/ACT nasal spray USE 2 SPRAYS IN EACH NOSTRIL DAILY 48 g 3   gabapentin (NEURONTIN) 300 MG capsule TAKE 1 TO 2 CAPSULES IN THE EVENING 180 capsule 3   glipiZIDE (GLUCOTROL XL) 2.5 MG 24 hr tablet TAKE 1 TABLET DAILY WITH BREAKFAST 90 tablet 3   GLUCOSAMINE-CHONDROITIN PO Take by mouth.     hydrocortisone 2.5 % cream Apply topically 2 (two) times daily. 453.6 g 0   Lancets (ONETOUCH ULTRASOFT) lancets USE TO TEST BLOOD GLUCOSE TWICE DAILY 200 each 3   miconazole (MICOTIN) 2 % cream Apply 1 Application topically 2 (two) times daily. 28.35 g 0   nitroGLYCERIN (NITROSTAT) 0.4 MG SL tablet DISSOLVE 1 TABLET UNDER THE TONGUE EVERY 5 MINUTES AS NEEDED FOR CHEST PAIN 75 tablet 14   olmesartan (BENICAR) 20 MG tablet TAKE 1 TABLET DAILY 90 tablet 3   Omega-3 Fatty Acids (FISH OIL ULTRA) 1400 MG CAPS Take by mouth.     sildenafil (VIAGRA) 25 MG tablet  Take 1 tablet (25 mg total) by mouth daily as needed for erectile dysfunction. 20 tablet 3   SYSTANE ULTRA 0.4-0.3 % SOLN      tamsulosin (FLOMAX) 0.4 MG CAPS capsule Take 1 capsule (0.4 mg total) by mouth daily. 90 capsule 3   terbinafine (LAMISIL) 250 MG tablet Take 1 tablet (  250 mg total) by mouth daily. 30 tablet 0   zolpidem (AMBIEN) 5 MG tablet TAKE 1 TABLET AT BEDTIME AS NEEDED FOR SLEEP 90 tablet 0   rosuvastatin (CRESTOR) 40 MG tablet Take 1 tablet (40 mg total) by mouth daily. 90 tablet 3   No current facility-administered medications on file prior to visit.    BP 102/60   Pulse 76   Temp (!) 97 F (36.1 C) (Oral)   Ht 5\' 7"  (1.702 m)   Wt 171 lb (77.6 kg)   SpO2 97%   BMI 26.78 kg/m       Objective:   Physical Exam Vitals and nursing note reviewed.  Constitutional:      Appearance: Normal appearance.  Cardiovascular:     Rate and Rhythm: Normal rate and regular rhythm.     Pulses: Normal pulses.     Heart sounds: Normal heart sounds.  Pulmonary:     Effort: Pulmonary effort is normal.     Breath sounds: Normal breath sounds.  Musculoskeletal:        General: Tenderness (bicep) present.  Skin:    General: Skin is warm and dry.  Neurological:     General: No focal deficit present.     Mental Status: He is alert and oriented to person, place, and time.  Psychiatric:        Mood and Affect: Mood normal.        Behavior: Behavior normal.        Thought Content: Thought content normal.        Judgment: Judgment normal.       Assessment & Plan:  1. Chronic pain of right knee Discussed risks and benefits of corticosteroid injection and patient consented.  After prepping skin with betadine, injected 80 mg depomedrol and 2 cc of plain xylocaine with 22 gauge one and one half inch needle using anterolateral approach and pt tolerated well.  - methylPREDNISolone acetate (DEPO-MEDROL) injection 80 mg   2. Chronic pain of left knee Discussed risks and benefits of  corticosteroid injection and patient consented.  After prepping skin with betadine, injected 80 mg depomedrol and 2 cc of plain xylocaine with 22 gauge one and one half inch needle using anterolateral approach and pt tolerated well.  - methylPREDNISolone acetate (DEPO-MEDROL) injection 80 mg   3. Pain in right upper arm  - Ambulatory referral to Physical Therapy  Shirline Frees, NP

## 2023-10-11 NOTE — Therapy (Signed)
OUTPATIENT PHYSICAL THERAPY UPPER EXTREMITY EVALUATION   Patient Name: Ian [name] MRN: [id number] DOB:06-07-1941, 82 y.o., male Today's Date: 10/12/2023  END OF SESSION:  PT End of Session - 10/12/23 1149     Visit Number 1    Date for PT Re-Evaluation 12/07/23    Authorization Type MCR    Progress Note Due on Visit 10    PT Start Time 1149    PT Stop Time 1230    PT Time Calculation (min) 41 min    Activity Tolerance Patient tolerated treatment well    Behavior During Therapy [facility] for tasks assessed/performed             Past Medical History:  Diagnosis Date   Allergy    Apnea, sleep 07/28/07   NPSG [place], AHI 79.9   Carotid artery stenosis    mild 04/2010, 05/2012   Coronary heart disease 2008   Cath 2008, 60-70% proximal diagonal 1 stenosis,  RCA 40% stenosis.  nonischemic Lexiscan 03/2011   Diabetes mellitus    type II   GERD (gastroesophageal reflux disease)    Hyperlipidemia    Hypertension    Hypospadias    Past Surgical History:  Procedure Laterality Date   EYE SURGERY     NASAL SINUS SURGERY     NISSEN FUNDOPLICATION     TONSILLECTOMY     VEIN LIGATION AND STRIPPING     Patient Active Problem List   Diagnosis Date Noted   Dizziness 09/25/2020   CKD (chronic kidney disease), stage II 09/25/2020   Type 2 diabetes mellitus with complication, without long-term current use of insulin (HCC) 09/21/2019   Coronary artery disease involving native coronary artery of native heart without angina pectoris 03/23/2017   Bilateral carotid artery stenosis 03/23/2017   Dyslipidemia 03/23/2017   IBS (irritable bowel syndrome) 12/12/2014   Erectile dysfunction 01/19/2014   Diabetic polyneuropathy (HCC) 11/20/2013   Shortness of breath 09/26/2012   Chronic insomnia 03/25/2012   PALPITATIONS 11/19/2010   URINARY INCONTINENCE, PASSIVE, CONTINUOUS LEAKAGE 11/19/2010   Disorder resulting from impaired renal function 11/13/2009   VARICOSE VEINS, LOWER EXTREMITIES  09/17/2009   BPH with urinary obstruction 09/17/2009   RESTLESS LEG SYNDROME 02/12/2009   HIP PAIN, RIGHT 02/12/2009   DIAPHRAGMATIC DISORDER 10/26/2008   Diabetes mellitus without complication (HCC) 01/08/2008   Hyperlipidemia 01/08/2008   Essential hypertension 01/08/2008   Coronary atherosclerosis 01/08/2008   Allergic rhinitis 01/08/2008   Obstructive sleep apnea 01/08/2008    PCP: Shirline Frees, NP   REFERRING PROVIDER: Shirline Frees, NP   REFERRING DIAG: [phone] (ICD-10-CM) - Pain in right upper arm   THERAPY DIAG:  Chronic right shoulder pain  Stiffness of right shoulder, not elsewhere classified  Chronic left shoulder pain  Muscle weakness (generalized)  Unsteadiness on feet  Rationale for Evaluation and Treatment: Rehabilitation  ONSET DATE: June 2024  SUBJECTIVE:  SUBJECTIVE STATEMENT: Aggravated it last summer starting a generator. I have also lost a lot of weight and have lost strength as well. I have limited movement in the arm and at night when I move around. Harder to reach behind my back  R >L. The left has a little pain but right is worse. Has had injections in B knees and shoulders. They were helpful in the shoulder this summer. That was joint pain, this feels more like muscle. I have a lot of dizziness and catch myself from falling, but no falls in the past 6 months.  Hand dominance: Right  PERTINENT HISTORY: CAD, DM, HTN, kidney issues  PAIN:  Are you having pain? Yes: NPRS scale: 5-6/10 Pain location: R deltoid area, R proximal forearm, R UT area Pain description: sharp Aggravating factors: IR, reaching OH,  Relieving factors: rest  PRECAUTIONS: None  RED FLAGS: None   WEIGHT BEARING RESTRICTIONS: No  FALLS:  Has patient fallen in last 6 months? No  LIVING  ENVIRONMENT: Lives with: lives with their spouse Lives in: House/apartment Stairs: Yes: External: 1 steps; none Has following equipment at home: None  OCCUPATION: retired  PLOF: Independent  PATIENT GOALS: freedom of motion without pain  NEXT MD VISIT:   OBJECTIVE:  Note: Objective measures were completed at Evaluation unless otherwise noted.  DIAGNOSTIC FINDINGS:  IMPRESSION: 1. Mild acromioclavicular degenerative change. 2. Mild subcortical cystic change in the lateral humeral head, nonspecific, but can be seen with rotator cuff pathology.  PATIENT SURVEYS:  FOTO 56 (goal 78)  COGNITION: Overall cognitive status: Within functional limits for tasks assessed     SENSATION: WFL  POSTURE: Rounded shoulders, forward head  UPPER EXTREMITY ROM: * marked pain  A/P ROM Right eval Left eval  Shoulder flexion 115/142*   Shoulder extension    Shoulder abduction 108/120*   Shoulder adduction    Shoulder internal rotation 56/70*   Shoulder external rotation 54/55*   Elbow flexion    Elbow extension    Wrist flexion    Wrist extension    Wrist ulnar deviation    Wrist radial deviation    Wrist pronation    Wrist supination    (Blank rows = not tested)  UPPER EXTREMITY MMT:  MMT Right eval Left eval  Shoulder flexion 4-   Shoulder extension 5   Shoulder abduction 4-   Shoulder adduction    Shoulder internal rotation 4+   Shoulder external rotation 4*   Middle trapezius    Lower trapezius    Elbow flexion 4   Elbow extension 5   Wrist flexion    Wrist extension 5   Wrist ulnar deviation    Wrist radial deviation    Wrist pronation    Wrist supination    Grip strength (lbs)    (Blank rows = not tested)  SHOULDER SPECIAL TESTS: Impingement tests: Neer impingement test: positive  and Hawkins/Kennedy impingement test: positive  Rotator cuff assessment: Empty can test: negative and Full can test: positive   JOINT MOBILITY TESTING:  Pain with AP mob,  relief with PA  PALPATION:  Marked in SITS tendons, IS muscle, LH biceps tendon   TODAY'S TREATMENT:  DATE:  10/12/23  See pt ed and HEP   PATIENT EDUCATION: Education details: PT eval findings, anticipated POC, initial HEP, and postural awareness  Person educated: Patient Education method: Explanation, Demonstration, and Handouts Education comprehension: verbalized understanding, returned demonstration, and verbal cues required  HOME EXERCISE PROGRAM: Access Code: [email or web address] URL: [email or web address] Date: 10/12/2023 Prepared by: Raynelle Fanning  Exercises - Seated Shoulder Flexion AAROM with Dowel  - 2 x daily - 7 x weekly - 1-2 sets - 10 reps - Seated Shoulder External Rotation AAROM with Cane and Hand in Neutral  - 2 x daily - 7 x weekly - 1-2 sets - 10 reps - Seated Shoulder Abduction Towel Slide at Table Top (Mirrored)  - 2 x daily - 7 x weekly - 1-2 sets - 10 reps - Seated Scapular Retraction  - 1 x daily - 7 x weekly - 1 sets - 10 reps - 2-3 sec hold  ASSESSMENT:  CLINICAL IMPRESSION: Patient is a 82 y.o. male who was seen today for physical therapy evaluation and treatment for R shoulder and arm pain beginning in the summer after starting a generator and limiting his ability to reach behind his back and overhead. He has decreased ROM, strength and postural deficits. He will benefit from skilled PT to address these deficits.  Patient reports he has some dizziness at times, but has had no recent falls. He may benefit from a balance screen in the future to assess risk for falls.   OBJECTIVE IMPAIRMENTS: decreased activity tolerance, decreased ROM, decreased strength, increased muscle spasms, impaired flexibility, impaired UE functional use, postural dysfunction, and pain.   ACTIVITY LIMITATIONS: bathing, dressing, reach over head, and  hygiene/grooming  PARTICIPATION LIMITATIONS:  anything with OH activity   PERSONAL FACTORS: Age, Fitness, Time since onset of injury/illness/exacerbation, and 3+ comorbidities: DM, CKD, CAD  are also affecting patient's functional outcome.   REHAB POTENTIAL: Good  CLINICAL DECISION MAKING: Evolving/moderate complexity  EVALUATION COMPLEXITY: Moderate   GOALS: Goals reviewed with patient? Yes  SHORT TERM GOALS: Target date: 11/09/2023   Patient will be independent with initial HEP.  Baseline:  Goal status: INITIAL  2.  Decreased shoulder pain with ADLs by 25%  Baseline:  Goal status: INITIAL  3.  Balance assessed for fall risk Baseline:  Goal status: INITIAL    LONG TERM GOALS: Target date: 12/07/2023   Patient will be independent with advanced/ongoing HEP to improve outcomes and carryover.  Baseline:  Goal status: INITIAL  2.  Patient will report 75% improvement in R shoulder pain to improve QOL.  Baseline:  Goal status: INITIAL  3.  Patient to improve R shoulder AROM to [facility] without pain provocation to allow for increased ease of ADLs.  Baseline:  Goal status: INITIAL  4.  Patient will demonstrate improved UE strength to 4+/5.  Baseline:  Goal status: INITIAL  5.  Patient will report 68 on FOTO to demonstrate improved functional ability.  Baseline:  Goal status: INITIAL  6.  Patient will be able to reach behind his back to dress without difficulty.   Baseline:  Goal status: INITIAL    PLAN:  PT FREQUENCY: 2x/week  PT DURATION: 8 weeks  PLANNED INTERVENTIONS: 97164- PT Re-evaluation, 97110-Therapeutic exercises, 97530- Therapeutic activity, 97112- Neuromuscular re-education, 97535- Self Care, 30865- Manual therapy, 97014- Electrical stimulation (unattended), 97033- Ionotophoresis 4mg /ml Dexamethasone, Patient/Family education, Balance training, Taping, Dry Needling, Joint mobilization, Spinal mobilization, Cryotherapy, and Moist heat  PLAN FOR NEXT  SESSION: Review and progress HEP, shoulder and postural  strengthening, shoulder ROM, chest opening, complete general balance assessment for fall risk, modalities/MT/DN for pain prn.   Solon Palm, PT  10/12/2023, 5:22 PM

## 2023-10-12 ENCOUNTER — Ambulatory Visit: Payer: Medicare Other | Attending: Adult Health | Admitting: Physical Therapy

## 2023-10-12 ENCOUNTER — Other Ambulatory Visit: Payer: Self-pay

## 2023-10-12 ENCOUNTER — Encounter: Payer: Self-pay | Admitting: Physical Therapy

## 2023-10-12 DIAGNOSIS — M6281 Muscle weakness (generalized): Secondary | ICD-10-CM | POA: Insufficient documentation

## 2023-10-12 DIAGNOSIS — M25511 Pain in right shoulder: Secondary | ICD-10-CM | POA: Insufficient documentation

## 2023-10-12 DIAGNOSIS — M79621 Pain in right upper arm: Secondary | ICD-10-CM | POA: Diagnosis not present

## 2023-10-12 DIAGNOSIS — M25512 Pain in left shoulder: Secondary | ICD-10-CM | POA: Insufficient documentation

## 2023-10-12 DIAGNOSIS — M25611 Stiffness of right shoulder, not elsewhere classified: Secondary | ICD-10-CM | POA: Insufficient documentation

## 2023-10-12 DIAGNOSIS — G8929 Other chronic pain: Secondary | ICD-10-CM | POA: Diagnosis not present

## 2023-10-12 DIAGNOSIS — R2681 Unsteadiness on feet: Secondary | ICD-10-CM | POA: Diagnosis not present

## 2023-10-13 ENCOUNTER — Other Ambulatory Visit: Payer: Self-pay | Admitting: Adult Health

## 2023-10-13 NOTE — Telephone Encounter (Signed)
Okay for refill?  

## 2023-10-14 ENCOUNTER — Ambulatory Visit: Payer: Medicare Other

## 2023-10-14 ENCOUNTER — Encounter: Payer: Self-pay | Admitting: Adult Health

## 2023-10-14 DIAGNOSIS — G8929 Other chronic pain: Secondary | ICD-10-CM | POA: Diagnosis not present

## 2023-10-14 DIAGNOSIS — R262 Difficulty in walking, not elsewhere classified: Secondary | ICD-10-CM

## 2023-10-14 DIAGNOSIS — R2681 Unsteadiness on feet: Secondary | ICD-10-CM

## 2023-10-14 DIAGNOSIS — M25512 Pain in left shoulder: Secondary | ICD-10-CM | POA: Diagnosis not present

## 2023-10-14 DIAGNOSIS — M6281 Muscle weakness (generalized): Secondary | ICD-10-CM

## 2023-10-14 DIAGNOSIS — M25611 Stiffness of right shoulder, not elsewhere classified: Secondary | ICD-10-CM | POA: Diagnosis not present

## 2023-10-14 DIAGNOSIS — M25511 Pain in right shoulder: Secondary | ICD-10-CM | POA: Diagnosis not present

## 2023-10-14 DIAGNOSIS — R293 Abnormal posture: Secondary | ICD-10-CM

## 2023-10-14 DIAGNOSIS — R252 Cramp and spasm: Secondary | ICD-10-CM

## 2023-10-14 NOTE — Therapy (Signed)
OUTPATIENT PHYSICAL THERAPY UPPER EXTREMITY TREATMENT   Patient Name: Ian [name] MRN: [id number] DOB:12-Aug-1941, 82 y.o., male Today's Date: 10/14/2023  END OF SESSION:  PT End of Session - 10/14/23 1149     Visit Number 2    Date for PT Re-Evaluation 12/07/23    Progress Note Due on Visit 10    PT Start Time 1149    PT Stop Time 1240    PT Time Calculation (min) 51 min    Activity Tolerance Patient tolerated treatment well    Behavior During Therapy [facility] for tasks assessed/performed             Past Medical History:  Diagnosis Date   Allergy    Apnea, sleep 07/28/07   NPSG [place], AHI 79.9   Carotid artery stenosis    mild 04/2010, 05/2012   Coronary heart disease 2008   Cath 2008, 60-70% proximal diagonal 1 stenosis,  RCA 40% stenosis.  nonischemic Lexiscan 03/2011   Diabetes mellitus    type II   GERD (gastroesophageal reflux disease)    Hyperlipidemia    Hypertension    Hypospadias    Past Surgical History:  Procedure Laterality Date   EYE SURGERY     NASAL SINUS SURGERY     NISSEN FUNDOPLICATION     TONSILLECTOMY     VEIN LIGATION AND STRIPPING     Patient Active Problem List   Diagnosis Date Noted   Dizziness 09/25/2020   CKD (chronic kidney disease), stage II 09/25/2020   Type 2 diabetes mellitus with complication, without long-term current use of insulin (HCC) 09/21/2019   Coronary artery disease involving native coronary artery of native heart without angina pectoris 03/23/2017   Bilateral carotid artery stenosis 03/23/2017   Dyslipidemia 03/23/2017   IBS (irritable bowel syndrome) 12/12/2014   Erectile dysfunction 01/19/2014   Diabetic polyneuropathy (HCC) 11/20/2013   Shortness of breath 09/26/2012   Chronic insomnia 03/25/2012   PALPITATIONS 11/19/2010   URINARY INCONTINENCE, PASSIVE, CONTINUOUS LEAKAGE 11/19/2010   Disorder resulting from impaired renal function 11/13/2009   VARICOSE VEINS, LOWER EXTREMITIES 09/17/2009   BPH with urinary  obstruction 09/17/2009   RESTLESS LEG SYNDROME 02/12/2009   HIP PAIN, RIGHT 02/12/2009   DIAPHRAGMATIC DISORDER 10/26/2008   Diabetes mellitus without complication (HCC) 01/08/2008   Hyperlipidemia 01/08/2008   Essential hypertension 01/08/2008   Coronary atherosclerosis 01/08/2008   Allergic rhinitis 01/08/2008   Obstructive sleep apnea 01/08/2008    PCP: Shirline Frees, NP   REFERRING PROVIDER: Shirline Frees, NP   REFERRING DIAG: [phone] (ICD-10-CM) - Pain in right upper arm   THERAPY DIAG:  Chronic right shoulder pain  Stiffness of right shoulder, not elsewhere classified  Chronic left shoulder pain  Muscle weakness (generalized)  Difficulty in walking, not elsewhere classified  Cramp and spasm  Abnormal posture  Unsteadiness on feet  Rationale for Evaluation and Treatment: Rehabilitation  ONSET DATE: June 2024  SUBJECTIVE:  SUBJECTIVE STATEMENT: Patient reports he did ok with the home exercises but did have some questions.   Hand dominance: Right  PERTINENT HISTORY: CAD, DM, HTN, kidney issues  PAIN:  10/14/23 Are you having pain? Yes: NPRS scale: 5/10 Pain location: R deltoid area, R proximal forearm, R UT area Pain description: sharp Aggravating factors: IR, reaching OH,  Relieving factors: rest  PRECAUTIONS: None  RED FLAGS: None   WEIGHT BEARING RESTRICTIONS: No  FALLS:  Has patient fallen in last 6 months? No  LIVING ENVIRONMENT: Lives with: lives with their spouse Lives in: House/apartment Stairs: Yes: External: 1 steps; none Has following equipment at home: None  OCCUPATION: retired  PLOF: Independent  PATIENT GOALS: freedom of motion without pain  NEXT MD VISIT:   OBJECTIVE:  Note: Objective measures were completed at Evaluation unless otherwise  noted.  DIAGNOSTIC FINDINGS:  IMPRESSION: 1. Mild acromioclavicular degenerative change. 2. Mild subcortical cystic change in the lateral humeral head, nonspecific, but can be seen with rotator cuff pathology.  PATIENT SURVEYS:  FOTO 56 (goal 57)  COGNITION: Overall cognitive status: Within functional limits for tasks assessed     SENSATION: WFL  POSTURE: Rounded shoulders, forward head  UPPER EXTREMITY ROM: * marked pain  A/P ROM Right eval Left eval  Shoulder flexion 115/142*   Shoulder extension    Shoulder abduction 108/120*   Shoulder adduction    Shoulder internal rotation 56/70*   Shoulder external rotation 54/55*   Elbow flexion    Elbow extension    Wrist flexion    Wrist extension    Wrist ulnar deviation    Wrist radial deviation    Wrist pronation    Wrist supination    (Blank rows = not tested)  UPPER EXTREMITY MMT:  MMT Right eval Left eval  Shoulder flexion 4-   Shoulder extension 5   Shoulder abduction 4-   Shoulder adduction    Shoulder internal rotation 4+   Shoulder external rotation 4*   Middle trapezius    Lower trapezius    Elbow flexion 4   Elbow extension 5   Wrist flexion    Wrist extension 5   Wrist ulnar deviation    Wrist radial deviation    Wrist pronation    Wrist supination    Grip strength (lbs)    (Blank rows = not tested)  SHOULDER SPECIAL TESTS: Impingement tests: Neer impingement test: positive  and Hawkins/Kennedy impingement test: positive  Rotator cuff assessment: Empty can test: negative and Full can test: positive   JOINT MOBILITY TESTING:  Pain with AP mob, relief with PA  PALPATION:  Marked in SITS tendons, IS muscle, LH biceps tendon   TODAY'S TREATMENT:                                                                                                                                         DATE:  10/14/23 UBE x 6 min 3/3 Supine AAROM with cane: flexion, abduction and ER Supine shoulder flexion 2 x  10 with 0 lbs Supine serratus punch 2 x 10 with 2 lbs Side lying ER 2 x 10 with 1 lbs Ice to right shoulder x 10 min   10/12/23  See pt ed and HEP   PATIENT EDUCATION: Education details: PT eval findings, anticipated POC, initial HEP, and postural awareness  Person educated: Patient Education method: Explanation, Demonstration, and Handouts Education comprehension: verbalized understanding, returned demonstration, and verbal cues required  HOME EXERCISE PROGRAM: Access Code: [email or web address] URL: [email or web address] Date: 10/14/2023 Prepared by: Mikey Kirschner  Exercises - Seated Shoulder Flexion AAROM with Dowel  - 2 x daily - 7 x weekly - 1-2 sets - 10 reps - Seated Shoulder External Rotation AAROM with Cane and Hand in Neutral  - 2 x daily - 7 x weekly - 1-2 sets - 10 reps - Seated Shoulder Abduction Towel Slide at Table Top (Mirrored)  - 2 x daily - 7 x weekly - 1-2 sets - 10 reps - Seated Scapular Retraction  - 1 x daily - 7 x weekly - 1 sets - 10 reps - 2-3 sec hold - Supine Shoulder Flexion Extension AAROM with Dowel  - 1 x daily - 7 x weekly - 1 sets - 20 reps - Supine Shoulder Abduction AAROM with Dowel  - 1 x daily - 7 x weekly - 1 sets - 20 reps - Supine Shoulder External Rotation in 45 Degrees Abduction AAROM with Dowel  - 1 x daily - 7 x weekly - 1 sets - 20 reps - Supine Shoulder Flexion Extension Full Range AROM  - 1 x daily - 7 x weekly - 2 sets - 10 reps - Single Arm Serratus Punches in Supine with Dumbbell  - 1 x daily - 7 x weekly - 2 sets - 10 reps - Sidelying Shoulder External Rotation  - 1 x daily - 7 x weekly - 2 sets - 10 reps  ASSESSMENT:  CLINICAL IMPRESSION: Patient was able to tolerate addition of UBE and supine AAROM along with gravity assisted shoulder flexion and side lying ER without exacerbation of pain. He fatigue quite easily with side lying ER.  He is quite atrophied in the area of the supraspinatus and infraspinatus.  He would benefit  from skilled PT for shoulder strengthening and stability training.  He may also benefit from a balance screen in the future to assess risk for falls.   OBJECTIVE IMPAIRMENTS: decreased activity tolerance, decreased ROM, decreased strength, increased muscle spasms, impaired flexibility, impaired UE functional use, postural dysfunction, and pain.   ACTIVITY LIMITATIONS: bathing, dressing, reach over head, and hygiene/grooming  PARTICIPATION LIMITATIONS:  anything with OH activity   PERSONAL FACTORS: Age, Fitness, Time since onset of injury/illness/exacerbation, and 3+ comorbidities: DM, CKD, CAD  are also affecting patient's functional outcome.   REHAB POTENTIAL: Good  CLINICAL DECISION MAKING: Evolving/moderate complexity  EVALUATION COMPLEXITY: Moderate   GOALS: Goals reviewed with patient? Yes  SHORT TERM GOALS: Target date: 11/09/2023   Patient will be independent with initial HEP.  Baseline:  Goal status: INITIAL  2.  Decreased shoulder pain with ADLs by 25%  Baseline:  Goal status: INITIAL  3.  Balance assessed for fall risk Baseline:  Goal status: INITIAL    LONG TERM GOALS: Target date: 12/07/2023   Patient will be independent with advanced/ongoing HEP to improve outcomes and carryover.  Baseline:  Goal status: INITIAL  2.  Patient will report 75% improvement in R shoulder pain to improve QOL.  Baseline:  Goal status: INITIAL  3.  Patient to improve R shoulder AROM to [facility] without pain provocation to allow for increased ease of ADLs.  Baseline:  Goal status: INITIAL  4.  Patient will demonstrate improved UE strength to 4+/5.  Baseline:  Goal status: INITIAL  5.  Patient will report 92 on FOTO to demonstrate improved functional ability.  Baseline:  Goal status: INITIAL  6.  Patient will be able to reach behind his back to dress without difficulty.   Baseline:  Goal status: INITIAL    PLAN:  PT FREQUENCY: 2x/week  PT DURATION: 8 weeks  PLANNED  INTERVENTIONS: 97164- PT Re-evaluation, 97110-Therapeutic exercises, 97530- Therapeutic activity, 97112- Neuromuscular re-education, 97535- Self Care, 40981- Manual therapy, 97014- Electrical stimulation (unattended), 97033- Ionotophoresis 4mg /ml Dexamethasone, Patient/Family education, Balance training, Taping, Dry Needling, Joint mobilization, Spinal mobilization, Cryotherapy, and Moist heat  PLAN FOR NEXT SESSION: Progress HEP, shoulder and postural strengthening, shoulder ROM, chest opening, complete general balance assessment for fall risk, modalities/MT/DN for pain prn.   Victorino Dike B. Laurann Mcmorris, PT 10/14/23 5:07 PM [facility] [address], [address] [place], [place] [address] Phone # [phone] Fax [phone]

## 2023-10-21 ENCOUNTER — Ambulatory Visit: Payer: Medicare Other

## 2023-10-21 DIAGNOSIS — R293 Abnormal posture: Secondary | ICD-10-CM

## 2023-10-21 DIAGNOSIS — R252 Cramp and spasm: Secondary | ICD-10-CM

## 2023-10-21 DIAGNOSIS — M25511 Pain in right shoulder: Secondary | ICD-10-CM | POA: Diagnosis not present

## 2023-10-21 DIAGNOSIS — M6281 Muscle weakness (generalized): Secondary | ICD-10-CM | POA: Diagnosis not present

## 2023-10-21 DIAGNOSIS — M25611 Stiffness of right shoulder, not elsewhere classified: Secondary | ICD-10-CM

## 2023-10-21 DIAGNOSIS — G8929 Other chronic pain: Secondary | ICD-10-CM

## 2023-10-21 DIAGNOSIS — M25512 Pain in left shoulder: Secondary | ICD-10-CM | POA: Diagnosis not present

## 2023-10-21 DIAGNOSIS — R262 Difficulty in walking, not elsewhere classified: Secondary | ICD-10-CM

## 2023-10-21 DIAGNOSIS — R2681 Unsteadiness on feet: Secondary | ICD-10-CM | POA: Diagnosis not present

## 2023-10-21 NOTE — Therapy (Signed)
OUTPATIENT PHYSICAL THERAPY UPPER EXTREMITY TREATMENT   Patient Name: Ian [name] MRN: [id number] DOB:11/19/1940, 82 y.o., male Today's Date: 10/21/2023  END OF SESSION:  PT End of Session - 10/21/23 1105     Visit Number 3    Date for PT Re-Evaluation 12/07/23    Authorization Type MCR    Progress Note Due on Visit 10    PT Start Time 1105    PT Stop Time 1145    PT Time Calculation (min) 40 min    Activity Tolerance Patient tolerated treatment well    Behavior During Therapy [facility] for tasks assessed/performed             Past Medical History:  Diagnosis Date   Allergy    Apnea, sleep 07/28/07   NPSG [place], AHI 79.9   Carotid artery stenosis    mild 04/2010, 05/2012   Coronary heart disease 2008   Cath 2008, 60-70% proximal diagonal 1 stenosis,  RCA 40% stenosis.  nonischemic Lexiscan 03/2011   Diabetes mellitus    type II   GERD (gastroesophageal reflux disease)    Hyperlipidemia    Hypertension    Hypospadias    Past Surgical History:  Procedure Laterality Date   EYE SURGERY     NASAL SINUS SURGERY     NISSEN FUNDOPLICATION     TONSILLECTOMY     VEIN LIGATION AND STRIPPING     Patient Active Problem List   Diagnosis Date Noted   Dizziness 09/25/2020   CKD (chronic kidney disease), stage II 09/25/2020   Type 2 diabetes mellitus with complication, without long-term current use of insulin (HCC) 09/21/2019   Coronary artery disease involving native coronary artery of native heart without angina pectoris 03/23/2017   Bilateral carotid artery stenosis 03/23/2017   Dyslipidemia 03/23/2017   IBS (irritable bowel syndrome) 12/12/2014   Erectile dysfunction 01/19/2014   Diabetic polyneuropathy (HCC) 11/20/2013   Shortness of breath 09/26/2012   Chronic insomnia 03/25/2012   PALPITATIONS 11/19/2010   URINARY INCONTINENCE, PASSIVE, CONTINUOUS LEAKAGE 11/19/2010   Disorder resulting from impaired renal function 11/13/2009   VARICOSE VEINS, LOWER EXTREMITIES  09/17/2009   BPH with urinary obstruction 09/17/2009   RESTLESS LEG SYNDROME 02/12/2009   HIP PAIN, RIGHT 02/12/2009   DIAPHRAGMATIC DISORDER 10/26/2008   Diabetes mellitus without complication (HCC) 01/08/2008   Hyperlipidemia 01/08/2008   Essential hypertension 01/08/2008   Coronary atherosclerosis 01/08/2008   Allergic rhinitis 01/08/2008   Obstructive sleep apnea 01/08/2008    PCP: Shirline Frees, NP   REFERRING PROVIDER: Shirline Frees, NP   REFERRING DIAG: [phone] (ICD-10-CM) - Pain in right upper arm   THERAPY DIAG:  Chronic right shoulder pain  Stiffness of right shoulder, not elsewhere classified  Chronic left shoulder pain  Muscle weakness (generalized)  Cramp and spasm  Abnormal posture  Difficulty in walking, not elsewhere classified  Unsteadiness on feet  Rationale for Evaluation and Treatment: Rehabilitation  ONSET DATE: June 2024  SUBJECTIVE:  SUBJECTIVE STATEMENT: Patient reports he did ok with the home exercises but did have some questions.   Hand dominance: Right  PERTINENT HISTORY: CAD, DM, HTN, kidney issues  PAIN:  10/21/23 Are you having pain? Yes: NPRS scale: 5/10 Pain location: R deltoid area, R proximal forearm, R UT area Pain description: sharp Aggravating factors: IR, reaching OH,  Relieving factors: rest  PRECAUTIONS: None  RED FLAGS: None   WEIGHT BEARING RESTRICTIONS: No  FALLS:  Has patient fallen in last 6 months? No  LIVING ENVIRONMENT: Lives with: lives with their spouse Lives in: House/apartment Stairs: Yes: External: 1 steps; none Has following equipment at home: None  OCCUPATION: retired  PLOF: Independent  PATIENT GOALS: freedom of motion without pain  NEXT MD VISIT:   OBJECTIVE:  Note: Objective measures were  completed at Evaluation unless otherwise noted.  DIAGNOSTIC FINDINGS:  IMPRESSION: 1. Mild acromioclavicular degenerative change. 2. Mild subcortical cystic change in the lateral humeral head, nonspecific, but can be seen with rotator cuff pathology.  PATIENT SURVEYS:  FOTO 56 (goal 75)  COGNITION: Overall cognitive status: Within functional limits for tasks assessed     SENSATION: WFL  POSTURE: Rounded shoulders, forward head  UPPER EXTREMITY ROM: * marked pain  A/P ROM Right eval Left eval  Shoulder flexion 115/142*   Shoulder extension    Shoulder abduction 108/120*   Shoulder adduction    Shoulder internal rotation 56/70*   Shoulder external rotation 54/55*   Elbow flexion    Elbow extension    Wrist flexion    Wrist extension    Wrist ulnar deviation    Wrist radial deviation    Wrist pronation    Wrist supination    (Blank rows = not tested)  UPPER EXTREMITY MMT:  MMT Right eval Left eval  Shoulder flexion 4-   Shoulder extension 5   Shoulder abduction 4-   Shoulder adduction    Shoulder internal rotation 4+   Shoulder external rotation 4*   Middle trapezius    Lower trapezius    Elbow flexion 4   Elbow extension 5   Wrist flexion    Wrist extension 5   Wrist ulnar deviation    Wrist radial deviation    Wrist pronation    Wrist supination    Grip strength (lbs)    (Blank rows = not tested)  SHOULDER SPECIAL TESTS: Impingement tests: Neer impingement test: positive  and Hawkins/Kennedy impingement test: positive  Rotator cuff assessment: Empty can test: negative and Full can test: positive   JOINT MOBILITY TESTING:  Pain with AP mob, relief with PA  PALPATION:  Marked in SITS tendons, IS muscle, LH biceps tendon   TODAY'S TREATMENT:                                                                                                                                         DATE:  10/21/23 Patient had his HEP and had several exercises marked  for clarification: reviewed all of HEP and wrote notes on patients written copies Supine AAROM with cane: flexion, abduction and ER Supine shoulder flexion 2 x 10 with 2 lbs Supine serratus punch 2 x 10 with 2 lbs Supine shoulder alphabet A-Z with 2 lbs Prone shoulder extension, rows and horizontal abd with 2 lbs  2 x 10  Side lying ER 2 x 10 with 2 lbs   10/14/23 UBE x 6 min 3/3 Supine AAROM with cane: flexion, abduction and ER Supine shoulder flexion 2 x 10 with 0 lbs Supine serratus punch 2 x 10 with 2 lbs Side lying ER 2 x 10 with 1 lbs Ice to right shoulder x 10 min   10/12/23  See pt ed and HEP   PATIENT EDUCATION: Education details: PT eval findings, anticipated POC, initial HEP, and postural awareness  Person educated: Patient Education method: Explanation, Demonstration, and Handouts Education comprehension: verbalized understanding, returned demonstration, and verbal cues required  HOME EXERCISE PROGRAM: Access Code: [email or web address] URL: [email or web address] Date: 10/14/2023 Prepared by: Mikey Kirschner  Exercises - Seated Shoulder Flexion AAROM with Dowel  - 2 x daily - 7 x weekly - 1-2 sets - 10 reps - Seated Shoulder External Rotation AAROM with Cane and Hand in Neutral  - 2 x daily - 7 x weekly - 1-2 sets - 10 reps - Seated Shoulder Abduction Towel Slide at Table Top (Mirrored)  - 2 x daily - 7 x weekly - 1-2 sets - 10 reps - Seated Scapular Retraction  - 1 x daily - 7 x weekly - 1 sets - 10 reps - 2-3 sec hold - Supine Shoulder Flexion Extension AAROM with Dowel  - 1 x daily - 7 x weekly - 1 sets - 20 reps - Supine Shoulder Abduction AAROM with Dowel  - 1 x daily - 7 x weekly - 1 sets - 20 reps - Supine Shoulder External Rotation in 45 Degrees Abduction AAROM with Dowel  - 1 x daily - 7 x weekly - 1 sets - 20 reps - Supine Shoulder Flexion Extension Full Range AROM  - 1 x daily - 7 x weekly - 2 sets - 10 reps - Single Arm Serratus Punches in Supine with  Dumbbell  - 1 x daily - 7 x weekly - 2 sets - 10 reps - Sidelying Shoulder External Rotation  - 1 x daily - 7 x weekly - 2 sets - 10 reps  ASSESSMENT:  CLINICAL IMPRESSION: Ian [name] is progressing appropriately.  He demonstrated improved form and tolerance to side lying ER.  We increased the resistance on several exercises.  He was able to complete these with good form and tolerance.  He rested at 16 reps on prone horizontal abd but completed all reps.   He would benefit from skilled PT for shoulder strengthening and stability training.  He may also benefit from a balance screen in the future to assess risk for falls.   OBJECTIVE IMPAIRMENTS: decreased activity tolerance, decreased ROM, decreased strength, increased muscle spasms, impaired flexibility, impaired UE functional use, postural dysfunction, and pain.   ACTIVITY LIMITATIONS: bathing, dressing, reach over head, and hygiene/grooming  PARTICIPATION LIMITATIONS:  anything with OH activity   PERSONAL FACTORS: Age, Fitness, Time since onset of injury/illness/exacerbation, and 3+ comorbidities: DM, CKD, CAD  are also affecting patient's functional outcome.   REHAB POTENTIAL: Good  CLINICAL DECISION MAKING: Evolving/moderate complexity  EVALUATION COMPLEXITY: Moderate  GOALS: Goals reviewed with patient? Yes  SHORT TERM GOALS: Target date: 11/09/2023   Patient will be independent with initial HEP.  Baseline:  Goal status: INITIAL  2.  Decreased shoulder pain with ADLs by 25%  Baseline:  Goal status: INITIAL  3.  Balance assessed for fall risk Baseline:  Goal status: INITIAL    LONG TERM GOALS: Target date: 12/07/2023   Patient will be independent with advanced/ongoing HEP to improve outcomes and carryover.  Baseline:  Goal status: INITIAL  2.  Patient will report 75% improvement in R shoulder pain to improve QOL.  Baseline:  Goal status: INITIAL  3.  Patient to improve R shoulder AROM to [facility] without pain  provocation to allow for increased ease of ADLs.  Baseline:  Goal status: INITIAL  4.  Patient will demonstrate improved UE strength to 4+/5.  Baseline:  Goal status: INITIAL  5.  Patient will report 22 on FOTO to demonstrate improved functional ability.  Baseline:  Goal status: INITIAL  6.  Patient will be able to reach behind his back to dress without difficulty.   Baseline:  Goal status: INITIAL    PLAN:  PT FREQUENCY: 2x/week  PT DURATION: 8 weeks  PLANNED INTERVENTIONS: 97164- PT Re-evaluation, 97110-Therapeutic exercises, 97530- Therapeutic activity, 97112- Neuromuscular re-education, 97535- Self Care, 13086- Manual therapy, 97014- Electrical stimulation (unattended), [phone]- Ionotophoresis 4mg /ml Dexamethasone, Patient/Family education, Balance training, Taping, Dry Needling, Joint mobilization, Spinal mobilization, Cryotherapy, and Moist heat  PLAN FOR NEXT SESSION: Progress HEP (add prone scapular stabilization if he is feeling comfortable with his existing HEP, continue shoulder and postural strengthening, shoulder ROM, chest opening, complete general balance assessment for fall risk, modalities/MT/DN for pain prn.   Victorino Dike B. Gladis Soley, PT 10/21/23 4:48 PM [facility] [address] [place], [place] [address] Phone # [phone] Fax [phone]

## 2023-10-26 ENCOUNTER — Ambulatory Visit: Payer: Medicare Other

## 2023-10-26 DIAGNOSIS — M25611 Stiffness of right shoulder, not elsewhere classified: Secondary | ICD-10-CM | POA: Diagnosis not present

## 2023-10-26 DIAGNOSIS — G8929 Other chronic pain: Secondary | ICD-10-CM

## 2023-10-26 DIAGNOSIS — M25511 Pain in right shoulder: Secondary | ICD-10-CM | POA: Diagnosis not present

## 2023-10-26 DIAGNOSIS — M6281 Muscle weakness (generalized): Secondary | ICD-10-CM

## 2023-10-26 DIAGNOSIS — R2681 Unsteadiness on feet: Secondary | ICD-10-CM | POA: Diagnosis not present

## 2023-10-26 DIAGNOSIS — M25512 Pain in left shoulder: Secondary | ICD-10-CM | POA: Diagnosis not present

## 2023-10-26 DIAGNOSIS — R293 Abnormal posture: Secondary | ICD-10-CM

## 2023-10-26 NOTE — Therapy (Signed)
OUTPATIENT PHYSICAL THERAPY UPPER EXTREMITY TREATMENT   Patient Name: Ian [name] MRN: [id number] DOB:1941/06/11, 82 y.o., male Today's Date: 10/26/2023  END OF SESSION:  PT End of Session - 10/26/23 1103     Visit Number 4    Date for PT Re-Evaluation 12/07/23    Authorization Type MCR    Progress Note Due on Visit 10    PT Start Time 1017    PT Stop Time 1100    PT Time Calculation (min) 43 min    Activity Tolerance Patient tolerated treatment well    Behavior During Therapy [facility] for tasks assessed/performed              Past Medical History:  Diagnosis Date   Allergy    Apnea, sleep 07/28/07   NPSG [place], AHI 79.9   Carotid artery stenosis    mild 04/2010, 05/2012   Coronary heart disease 2008   Cath 2008, 60-70% proximal diagonal 1 stenosis,  RCA 40% stenosis.  nonischemic Lexiscan 03/2011   Diabetes mellitus    type II   GERD (gastroesophageal reflux disease)    Hyperlipidemia    Hypertension    Hypospadias    Past Surgical History:  Procedure Laterality Date   EYE SURGERY     NASAL SINUS SURGERY     NISSEN FUNDOPLICATION     TONSILLECTOMY     VEIN LIGATION AND STRIPPING     Patient Active Problem List   Diagnosis Date Noted   Dizziness 09/25/2020   CKD (chronic kidney disease), stage II 09/25/2020   Type 2 diabetes mellitus with complication, without long-term current use of insulin (HCC) 09/21/2019   Coronary artery disease involving native coronary artery of native heart without angina pectoris 03/23/2017   Bilateral carotid artery stenosis 03/23/2017   Dyslipidemia 03/23/2017   IBS (irritable bowel syndrome) 12/12/2014   Erectile dysfunction 01/19/2014   Diabetic polyneuropathy (HCC) 11/20/2013   Shortness of breath 09/26/2012   Chronic insomnia 03/25/2012   PALPITATIONS 11/19/2010   URINARY INCONTINENCE, PASSIVE, CONTINUOUS LEAKAGE 11/19/2010   Disorder resulting from impaired renal function 11/13/2009   VARICOSE VEINS, LOWER EXTREMITIES  09/17/2009   BPH with urinary obstruction 09/17/2009   RESTLESS LEG SYNDROME 02/12/2009   HIP PAIN, RIGHT 02/12/2009   DIAPHRAGMATIC DISORDER 10/26/2008   Diabetes mellitus without complication (HCC) 01/08/2008   Hyperlipidemia 01/08/2008   Essential hypertension 01/08/2008   Coronary atherosclerosis 01/08/2008   Allergic rhinitis 01/08/2008   Obstructive sleep apnea 01/08/2008    PCP: Shirline Frees, NP   REFERRING PROVIDER: Shirline Frees, NP   REFERRING DIAG: [id number] (ICD-10-CM) - Pain in right upper arm   THERAPY DIAG:  Chronic right shoulder pain  Stiffness of right shoulder, not elsewhere classified  Muscle weakness (generalized)  Abnormal posture  Rationale for Evaluation and Treatment: Rehabilitation  ONSET DATE: June 2024  SUBJECTIVE:  SUBJECTIVE STATEMENT: I'm having some left arm pain from doing new exercises.  I can't do the exercises 2x/day, it makes me too tired. Reaching has been easier.  Hand dominance: Right  PERTINENT HISTORY: CAD, DM, HTN, kidney issues  PAIN:  10/26/23 Are you having pain? Yes: NPRS scale: 0-5/10 Pain location: R deltoid area, R proximal forearm, R UT area Pain description: sharp Aggravating factors: IR, reaching OH, exercises Relieving factors: rest  PRECAUTIONS: None  RED FLAGS: None   WEIGHT BEARING RESTRICTIONS: No  FALLS:  Has patient fallen in last 6 months? No  LIVING ENVIRONMENT: Lives with: lives with their spouse Lives in: House/apartment Stairs: Yes: External: 1 steps; none Has following equipment at home: None  OCCUPATION: retired  PLOF: Independent  PATIENT GOALS: freedom of motion without pain  NEXT MD VISIT:   OBJECTIVE:  Note: Objective measures were completed at Evaluation unless otherwise noted.  DIAGNOSTIC  FINDINGS:  IMPRESSION: 1. Mild acromioclavicular degenerative change. 2. Mild subcortical cystic change in the lateral humeral head, nonspecific, but can be seen with rotator cuff pathology.  PATIENT SURVEYS:  FOTO 56 (goal 92)  COGNITION: Overall cognitive status: Within functional limits for tasks assessed     SENSATION: WFL  POSTURE: Rounded shoulders, forward head  UPPER EXTREMITY ROM: * marked pain  A/P ROM Right eval Left eval  Shoulder flexion 115/142* 132 A/ROM  Shoulder extension    Shoulder abduction 108/120* 108 A/ROM  Shoulder adduction    Shoulder internal rotation 56/70*   Shoulder external rotation 54/55*   Elbow flexion    Elbow extension    Wrist flexion    Wrist extension    Wrist ulnar deviation    Wrist radial deviation    Wrist pronation    Wrist supination    (Blank rows = not tested)  UPPER EXTREMITY MMT:  MMT Right eval Left eval  Shoulder flexion 4-   Shoulder extension 5   Shoulder abduction 4-   Shoulder adduction    Shoulder internal rotation 4+   Shoulder external rotation 4*   Middle trapezius    Lower trapezius    Elbow flexion 4   Elbow extension 5   Wrist flexion    Wrist extension 5   Wrist ulnar deviation    Wrist radial deviation    Wrist pronation    Wrist supination    Grip strength (lbs)    (Blank rows = not tested)  SHOULDER SPECIAL TESTS: Impingement tests: Neer impingement test: positive  and Hawkins/Kennedy impingement test: positive  Rotator cuff assessment: Empty can test: negative and Full can test: positive   JOINT MOBILITY TESTING:  Pain with AP mob, relief with PA  PALPATION:  Marked in SITS tendons, IS muscle, LH biceps tendon   TODAY'S TREATMENT:  DATE:   10/26/23 NuStep: level 4x 6 minutes- PT present to discuss progress  Supine AAROM with cane: abduction  x10 with10" hold-verbal and tactile cues  Supine shoulder flexion 2 x 10 with 2 lbs Supine serratus punch 2 x 10 with 2 lbs Shoulder extension and rows 2x10 with red band  Overhead pulleys: flexion x3 minutes, abduction  Side lying ER 2 x 10 with 2 lbs   10/21/23 Patient had his HEP and had several exercises marked for clarification: reviewed all of HEP and wrote notes on patients written copies Supine AAROM with cane: flexion, abduction and ER Supine shoulder flexion 2 x 10 with 2 lbs Supine serratus punch 2 x 10 with 2 lbs Supine shoulder alphabet A-Z with 2 lbs Prone shoulder extension, rows and horizontal abd with 2 lbs  2 x 10  Side lying ER 2 x 10 with 2 lbs   10/14/23 UBE x 6 min 3/3 Supine AAROM with cane: flexion, abduction and ER Supine shoulder flexion 2 x 10 with 0 lbs Supine serratus punch 2 x 10 with 2 lbs Side lying ER 2 x 10 with 1 lbs Ice to right shoulder x 10 min    PATIENT EDUCATION: Education details: PT eval findings, anticipated POC, initial HEP, and postural awareness  Person educated: Patient Education method: Explanation, Demonstration, and Handouts Education comprehension: verbalized understanding, returned demonstration, and verbal cues required  HOME EXERCISE PROGRAM: Access Code: [email or web address] URL: [email or web address] Date: 10/26/2023 Prepared by: Tresa Endo  Exercises - Seated Shoulder Flexion AAROM with Dowel  - 2 x daily - 7 x weekly - 1-2 sets - 10 reps - Seated Shoulder External Rotation AAROM with Cane and Hand in Neutral  - 2 x daily - 7 x weekly - 1-2 sets - 10 reps - Seated Shoulder Abduction Towel Slide at Table Top (Mirrored)  - 2 x daily - 7 x weekly - 1-2 sets - 10 reps - Seated Scapular Retraction  - 1 x daily - 7 x weekly - 1 sets - 10 reps - 2-3 sec hold - Supine Shoulder Flexion Extension AAROM with Dowel  - 1 x daily - 7 x weekly - 1 sets - 20 reps - Supine Shoulder Abduction AAROM with Dowel  - 1 x daily - 7 x weekly -  1 sets - 20 reps - Supine Shoulder External Rotation in 45 Degrees Abduction AAROM with Dowel  - 1 x daily - 7 x weekly - 1 sets - 20 reps - Supine Shoulder Flexion Extension Full Range AROM  - 1 x daily - 7 x weekly - 2 sets - 10 reps - Single Arm Serratus Punches in Supine with Dumbbell  - 1 x daily - 7 x weekly - 2 sets - 10 reps - Sidelying Shoulder External Rotation  - 1 x daily - 7 x weekly - 2 sets - 10 reps - Standing Row with Anchored Resistance  - 1 x daily - 7 x weekly - 2 sets - 10 reps - Shoulder Extension with Resistance  - 1 x daily - 7 x weekly - 2 sets - 10 reps  ASSESSMENT:  CLINICAL IMPRESSION: Pt arrived with questions regarding some of his exercises.  Pt required tactile cues for supine shoulder abduction with cane.  PT added standing theraband exercises and reviewed most of his current HEP as he will be out of town until 11/16/23.  Pt with improved shoulder flexion today.  PT monitored throughout session for pain and technique.   He  may also benefit from a balance screen in the future to assess risk for falls.   OBJECTIVE IMPAIRMENTS: decreased activity tolerance, decreased ROM, decreased strength, increased muscle spasms, impaired flexibility, impaired UE functional use, postural dysfunction, and pain.   ACTIVITY LIMITATIONS: bathing, dressing, reach over head, and hygiene/grooming  PARTICIPATION LIMITATIONS:  anything with OH activity   PERSONAL FACTORS: Age, Fitness, Time since onset of injury/illness/exacerbation, and 3+ comorbidities: DM, CKD, CAD  are also affecting patient's functional outcome.   REHAB POTENTIAL: Good  CLINICAL DECISION MAKING: Evolving/moderate complexity  EVALUATION COMPLEXITY: Moderate   GOALS: Goals reviewed with patient? Yes  SHORT TERM GOALS: Target date: 11/09/2023   Patient will be independent with initial HEP.  Baseline: 10/26/23 Goal status: MET  2.  Decreased shoulder pain with ADLs by 25%  Baseline: reduced pain today  (10/26/23) Goal status: in progress   3.  Balance assessed for fall risk Baseline:  Goal status: INITIAL    LONG TERM GOALS: Target date: 12/07/2023   Patient will be independent with advanced/ongoing HEP to improve outcomes and carryover.  Baseline:  Goal status: INITIAL  2.  Patient will report 75% improvement in R shoulder pain to improve QOL.  Baseline:  Goal status: INITIAL  3.  Patient to improve R shoulder AROM to [facility] without pain provocation to allow for increased ease of ADLs.  Baseline:  Goal status: INITIAL  4.  Patient will demonstrate improved UE strength to 4+/5.  Baseline:  Goal status: INITIAL  5.  Patient will report 13 on FOTO to demonstrate improved functional ability.  Baseline:  Goal status: INITIAL  6.  Patient will be able to reach behind his back to dress without difficulty.   Baseline:  Goal status: INITIAL    PLAN:  PT FREQUENCY: 2x/week  PT DURATION: 8 weeks  PLANNED INTERVENTIONS: 97164- PT Re-evaluation, 97110-Therapeutic exercises, 97530- Therapeutic activity, 97112- Neuromuscular re-education, 97535- Self Care, 16109- Manual therapy, 97014- Electrical stimulation (unattended), 97033- Ionotophoresis 4mg /ml Dexamethasone, Patient/Family education, Balance training, Taping, Dry Needling, Joint mobilization, Spinal mobilization, Cryotherapy, and Moist heat  PLAN FOR NEXT SESSION: falls risk assessment, see how pt did on his trip with exercises.    Lorrene Reid, PT 10/26/23 11:08 AM  [facility] [address], [address] [place], [place] [address] Phone # [phone] Fax [phone]

## 2023-11-16 ENCOUNTER — Ambulatory Visit: Payer: Medicare Other | Attending: Adult Health

## 2023-11-16 DIAGNOSIS — M25511 Pain in right shoulder: Secondary | ICD-10-CM | POA: Diagnosis not present

## 2023-11-16 DIAGNOSIS — G8929 Other chronic pain: Secondary | ICD-10-CM | POA: Insufficient documentation

## 2023-11-16 DIAGNOSIS — M6281 Muscle weakness (generalized): Secondary | ICD-10-CM | POA: Diagnosis not present

## 2023-11-16 DIAGNOSIS — R252 Cramp and spasm: Secondary | ICD-10-CM | POA: Diagnosis not present

## 2023-11-16 DIAGNOSIS — M25611 Stiffness of right shoulder, not elsewhere classified: Secondary | ICD-10-CM | POA: Diagnosis not present

## 2023-11-16 DIAGNOSIS — M25512 Pain in left shoulder: Secondary | ICD-10-CM | POA: Diagnosis not present

## 2023-11-16 DIAGNOSIS — R293 Abnormal posture: Secondary | ICD-10-CM | POA: Insufficient documentation

## 2023-11-16 NOTE — Therapy (Signed)
 OUTPATIENT PHYSICAL THERAPY UPPER EXTREMITY TREATMENT   Patient Name: Ian [name] MRN: [id number] DOB:11-13-1940, 83 y.o., male Today's Date: 11/16/2023  END OF SESSION:  PT End of Session - 11/16/23 1534     Visit Number 5    Date for PT Re-Evaluation 12/07/23    Authorization Type MCR    Progress Note Due on Visit 10    PT Start Time 1534    PT Stop Time 1630    PT Time Calculation (min) 56 min    Activity Tolerance Patient tolerated treatment well    Behavior During Therapy [facility] for tasks assessed/performed              Past Medical History:  Diagnosis Date   Allergy    Apnea, sleep 07/28/07   NPSG [place] , AHI 79.9   Carotid artery stenosis    mild 04/2010, 05/2012   Coronary heart disease 2008   Cath 2008, 60-70% proximal diagonal 1 stenosis,  RCA 40% stenosis.  nonischemic Lexiscan 03/2011   Diabetes mellitus    type II   GERD (gastroesophageal reflux disease)    Hyperlipidemia    Hypertension    Hypospadias    Past Surgical History:  Procedure Laterality Date   EYE SURGERY     NASAL SINUS SURGERY     NISSEN FUNDOPLICATION     TONSILLECTOMY     VEIN LIGATION AND STRIPPING     Patient Active Problem List   Diagnosis Date Noted   Dizziness 09/25/2020   CKD (chronic kidney disease), stage II 09/25/2020   Type 2 diabetes mellitus with complication, without long-term current use of insulin (HCC) 09/21/2019   Coronary artery disease involving native coronary artery of native heart without angina pectoris 03/23/2017   Bilateral carotid artery stenosis 03/23/2017   Dyslipidemia 03/23/2017   IBS (irritable bowel syndrome) 12/12/2014   Erectile dysfunction 01/19/2014   Diabetic polyneuropathy (HCC) 11/20/2013   Shortness of breath 09/26/2012   Chronic insomnia 03/25/2012   PALPITATIONS 11/19/2010   URINARY INCONTINENCE, PASSIVE, CONTINUOUS LEAKAGE 11/19/2010   Disorder resulting from impaired renal function 11/13/2009   VARICOSE VEINS, LOWER EXTREMITIES  09/17/2009   BPH with urinary obstruction 09/17/2009   RESTLESS LEG SYNDROME 02/12/2009   HIP PAIN, RIGHT 02/12/2009   DIAPHRAGMATIC DISORDER 10/26/2008   Diabetes mellitus without complication (HCC) 01/08/2008   Hyperlipidemia 01/08/2008   Essential hypertension 01/08/2008   Coronary atherosclerosis 01/08/2008   Allergic rhinitis 01/08/2008   Obstructive sleep apnea 01/08/2008    PCP: Merna Huxley, NP   REFERRING PROVIDER: Merna Huxley, NP   REFERRING DIAG: [phone] (ICD-10-CM) - Pain in right upper arm   THERAPY DIAG:  Chronic right shoulder pain  Stiffness of right shoulder, not elsewhere classified  Muscle weakness (generalized)  Abnormal posture  Cramp and spasm  Rationale for Evaluation and Treatment: Rehabilitation  ONSET DATE: June 2024  SUBJECTIVE:  SUBJECTIVE STATEMENT: Patient reports he traveled a lot during the holidays.  Has a cold and not feeling all that great but shoulders are both doing well.  He reports his pain at 1-2/10 in the right shoulder.  Left shoulder is fine per patient.    Hand dominance: Right  PERTINENT HISTORY: CAD, DM, HTN, kidney issues  PAIN:  10/26/23 Are you having pain? Yes: NPRS scale: 0-5/10 Pain location: R deltoid area, R proximal forearm, R UT area Pain description: sharp Aggravating factors: IR, reaching OH, exercises Relieving factors: rest  PRECAUTIONS: None  RED FLAGS: None   WEIGHT BEARING RESTRICTIONS: No  FALLS:  Has patient fallen in last 6 months? No  LIVING ENVIRONMENT: Lives with: lives with their spouse Lives in: House/apartment Stairs: Yes: External: 1 steps; none Has following equipment at home: None  OCCUPATION: retired  PLOF: Independent  PATIENT GOALS: freedom of motion without pain  NEXT MD VISIT:    OBJECTIVE:  Note: Objective measures were completed at Evaluation unless otherwise noted.  DIAGNOSTIC FINDINGS:  IMPRESSION: 1. Mild acromioclavicular degenerative change. 2. Mild subcortical cystic change in the lateral humeral head, nonspecific, but can be seen with rotator cuff pathology.  PATIENT SURVEYS:  FOTO 56 (goal 55)  COGNITION: Overall cognitive status: Within functional limits for tasks assessed     SENSATION: WFL  POSTURE: Rounded shoulders, forward head  UPPER EXTREMITY ROM: * marked pain  A/P ROM Right eval Left eval  Shoulder flexion 115/142* 132 A/ROM  Shoulder extension    Shoulder abduction 108/120* 108 A/ROM  Shoulder adduction    Shoulder internal rotation 56/70*   Shoulder external rotation 54/55*   Elbow flexion    Elbow extension    Wrist flexion    Wrist extension    Wrist ulnar deviation    Wrist radial deviation    Wrist pronation    Wrist supination    (Blank rows = not tested)  UPPER EXTREMITY MMT:  MMT Right eval Left eval  Shoulder flexion 4-   Shoulder extension 5   Shoulder abduction 4-   Shoulder adduction    Shoulder internal rotation 4+   Shoulder external rotation 4*   Middle trapezius    Lower trapezius    Elbow flexion 4   Elbow extension 5   Wrist flexion    Wrist extension 5   Wrist ulnar deviation    Wrist radial deviation    Wrist pronation    Wrist supination    Grip strength (lbs)    (Blank rows = not tested)  SHOULDER SPECIAL TESTS: Impingement tests: Neer impingement test: positive  and Hawkins/Kennedy impingement test: positive  Rotator cuff assessment: Empty can test: negative and Full can test: positive   JOINT MOBILITY TESTING:  Pain with AP mob, relief with PA  PALPATION:  Marked in SITS tendons, IS muscle, LH biceps tendon   TODAY'S TREATMENT:  DATE:   11/16/23 UBE x 5 min level 1 PROM right shoulder x 15 min Verbally reviewed HEP and how aggressive to be with his stretching/ AAROM 3 way scapular stabilization with blue loop  x 5 each UE patient required frequent rest breaks 4 D ball rolls x 20 each right only with light blue plyo ball patient required frequent rest breaks Standing shoulder flexion with 2 lbs 2 x 10 Standing shoulder scaption with 2 lbs 2 x 10 Lat pull down with 30 lbs 2 x 10 Ice to right shoulder in supine hook lying x 10 min  10/26/23 NuStep: level 4x 6 minutes- PT present to discuss progress  Supine AAROM with cane: abduction x10 with10 hold-verbal and tactile cues  Supine shoulder flexion 2 x 10 with 2 lbs Supine serratus punch 2 x 10 with 2 lbs Shoulder extension and rows 2x10 with red band  Overhead pulleys: flexion x3 minutes, abduction  Side lying ER 2 x 10 with 2 lbs   10/21/23 Patient had his HEP and had several exercises marked for clarification: reviewed all of HEP and wrote notes on patients written copies Supine AAROM with cane: flexion, abduction and ER Supine shoulder flexion 2 x 10 with 2 lbs Supine serratus punch 2 x 10 with 2 lbs Supine shoulder alphabet A-Z with 2 lbs Prone shoulder extension, rows and horizontal abd with 2 lbs  2 x 10  Side lying ER 2 x 10 with 2 lbs    PATIENT EDUCATION: Education details: PT eval findings, anticipated POC, initial HEP, and postural awareness  Person educated: Patient Education method: Explanation, Demonstration, and Handouts Education comprehension: verbalized understanding, returned demonstration, and verbal cues required  HOME EXERCISE PROGRAM: Access Code: [email or web address] URL: [email or web address] Date: 10/26/2023 Prepared by: Burnard  Exercises - Seated Shoulder Flexion AAROM with Dowel  - 2 x daily - 7 x weekly - 1-2 sets - 10 reps - Seated Shoulder External Rotation AAROM with Cane and Hand in Neutral  - 2 x daily - 7 x weekly - 1-2  sets - 10 reps - Seated Shoulder Abduction Towel Slide at Table Top (Mirrored)  - 2 x daily - 7 x weekly - 1-2 sets - 10 reps - Seated Scapular Retraction  - 1 x daily - 7 x weekly - 1 sets - 10 reps - 2-3 sec hold - Supine Shoulder Flexion Extension AAROM with Dowel  - 1 x daily - 7 x weekly - 1 sets - 20 reps - Supine Shoulder Abduction AAROM with Dowel  - 1 x daily - 7 x weekly - 1 sets - 20 reps - Supine Shoulder External Rotation in 45 Degrees Abduction AAROM with Dowel  - 1 x daily - 7 x weekly - 1 sets - 20 reps - Supine Shoulder Flexion Extension Full Range AROM  - 1 x daily - 7 x weekly - 2 sets - 10 reps - Single Arm Serratus Punches in Supine with Dumbbell  - 1 x daily - 7 x weekly - 2 sets - 10 reps - Sidelying Shoulder External Rotation  - 1 x daily - 7 x weekly - 2 sets - 10 reps - Standing Row with Anchored Resistance  - 1 x daily - 7 x weekly - 2 sets - 10 reps - Shoulder Extension with Resistance  - 1 x daily - 7 x weekly - 2 sets - 10 reps  ASSESSMENT:  CLINICAL IMPRESSION: Patient is progressing appropriately.  He was unable to do his HEP  as consistently while he was out of town.  He seems, however, to be progressing with ROM and strength as well as function.  He is able to reach overhead with increased ease.  He is able to do his ADL's with increased ease and overall pain level reports are low.  He would benefit from continuing skilled PT   He may also benefit from a balance screen in the future to assess risk for falls.   OBJECTIVE IMPAIRMENTS: decreased activity tolerance, decreased ROM, decreased strength, increased muscle spasms, impaired flexibility, impaired UE functional use, postural dysfunction, and pain.   ACTIVITY LIMITATIONS: bathing, dressing, reach over head, and hygiene/grooming  PARTICIPATION LIMITATIONS:  anything with OH activity   PERSONAL FACTORS: Age, Fitness, Time since onset of injury/illness/exacerbation, and 3+ comorbidities: DM, CKD, CAD  are also  affecting patient's functional outcome.   REHAB POTENTIAL: Good  CLINICAL DECISION MAKING: Evolving/moderate complexity  EVALUATION COMPLEXITY: Moderate   GOALS: Goals reviewed with patient? Yes  SHORT TERM GOALS: Target date: 11/09/2023   Patient will be independent with initial HEP.  Baseline: 10/26/23 Goal status: MET  2.  Decreased shoulder pain with ADLs by 25%  Baseline: reduced pain today (10/26/23) Goal status:MET 11/16/23   3.  Balance assessed for fall risk Baseline:  Goal status: INITIAL    LONG TERM GOALS: Target date: 12/07/2023   Patient will be independent with advanced/ongoing HEP to improve outcomes and carryover.  Baseline:  Goal status: INITIAL  2.  Patient will report 75% improvement in R shoulder pain to improve QOL.  Baseline:  Goal status: In Progress  3.  Patient to improve R shoulder AROM to [facility] without pain provocation to allow for increased ease of ADLs.  Baseline:  Goal status: In Progress  4.  Patient will demonstrate improved UE strength to 4+/5.  Baseline:  Goal status: In Progress  5.  Patient will report 51 on FOTO to demonstrate improved functional ability.  Baseline:  Goal status: INITIAL  6.  Patient will be able to reach behind his back to dress without difficulty.   Baseline:  Goal status: INITIAL    PLAN:  PT FREQUENCY: 2x/week  PT DURATION: 8 weeks  PLANNED INTERVENTIONS: 97164- PT Re-evaluation, 97110-Therapeutic exercises, 97530- Therapeutic activity, 97112- Neuromuscular re-education, 97535- Self Care, 02859- Manual therapy, 97014- Electrical stimulation (unattended), 97033- Ionotophoresis 4mg /ml Dexamethasone, Patient/Family education, Balance training, Taping, Dry Needling, Joint mobilization, Spinal mobilization, Cryotherapy, and Moist heat  PLAN FOR NEXT SESSION: falls risk assessment, continue with right shoulder strengthening and ROM   Sriya Kroeze B. Tammey Deeg, PT 11/16/23 4:50 PM [facility] [address] [place], [place] [address] Phone # [phone] Fax [phone]

## 2023-11-17 ENCOUNTER — Other Ambulatory Visit: Payer: Self-pay | Admitting: Adult Health

## 2023-11-17 DIAGNOSIS — Z760 Encounter for issue of repeat prescription: Secondary | ICD-10-CM

## 2023-11-17 NOTE — Telephone Encounter (Signed)
 Okay for refill?

## 2023-11-19 ENCOUNTER — Ambulatory Visit (INDEPENDENT_AMBULATORY_CARE_PROVIDER_SITE_OTHER): Payer: Medicare Other | Admitting: Family Medicine

## 2023-11-19 ENCOUNTER — Encounter: Payer: Self-pay | Admitting: Family Medicine

## 2023-11-19 VITALS — BP 126/62 | HR 90 | Temp 97.5°F | Wt 171.8 lb

## 2023-11-19 DIAGNOSIS — J0110 Acute frontal sinusitis, unspecified: Secondary | ICD-10-CM | POA: Diagnosis not present

## 2023-11-19 MED ORDER — AMOXICILLIN-POT CLAVULANATE 875-125 MG PO TABS: 1.0000 | ORAL_TABLET | Freq: Two times a day (BID) | ORAL | 0 refills | Status: DC

## 2023-11-19 NOTE — Progress Notes (Signed)
 Established Patient Office Visit  Subjective   Patient ID: Ian [name], male    DOB: July 24, 1941  Age: 83 y.o. MRN: [id number]  Chief Complaint  Patient presents with   Cough    Patient complains of cough, Productive with greenish sputum, x3 weeks    Sinusitis    Patient complains of sinusitis, x3 weeks     HPI   Mr. Ian [name] is is seen with 3-week history of progressive sinusitis symptoms.  He states he felt this started more or less as a cold but has been not clearing.  Symptoms are somewhat up-and-down in terms of severity.  He has some cough productive of green sputum along with some thick yellow-green nasal discharge.  He has had some bodyaches intermittently.  No fever.  Intermittent headaches and frontal sinus pressure.  No known antibiotic allergies.  He does have chronic kidney disease with GFR around 40.  Remote history of sinus surgery  Past Medical History:  Diagnosis Date   Allergy    Apnea, sleep 07/28/07   NPSG [place] , AHI 79.9   Carotid artery stenosis    mild 04/2010, 05/2012   Coronary heart disease 2008   Cath 2008, 60-70% proximal diagonal 1 stenosis,  RCA 40% stenosis.  nonischemic Lexiscan 03/2011   Diabetes mellitus    type II   GERD (gastroesophageal reflux disease)    Hyperlipidemia    Hypertension    Hypospadias    Past Surgical History:  Procedure Laterality Date   EYE SURGERY     NASAL SINUS SURGERY     NISSEN FUNDOPLICATION     TONSILLECTOMY     VEIN LIGATION AND STRIPPING      reports that he has never smoked. He has never used smokeless tobacco. He reports that he does not drink alcohol and does not use drugs. family history includes Coronary artery disease (age of onset: 44) in his father; Dementia in his mother. Allergies  Allergen Reactions   Iohexol      Desc: pt. states severe reaction, dr. told patient never to have the dye again     Review of Systems  Constitutional:  Positive for malaise/fatigue. Negative for chills and fever.   HENT:  Positive for congestion and sinus pain. Negative for ear pain.   Respiratory:  Positive for cough.   Neurological:  Positive for headaches.      Objective:     BP 126/62 (BP Location: Left Arm, Patient Position: Sitting, Cuff Size: Normal)   Pulse 90   Temp (!) 97.5 F (36.4 C) (Oral)   Wt 171 lb 12.8 oz (77.9 kg)   SpO2 98%   BMI 26.91 kg/m  BP Readings from Last 3 Encounters:  11/19/23 126/62  10/01/23 102/60  04/06/23 120/60   Wt Readings from Last 3 Encounters:  11/19/23 171 lb 12.8 oz (77.9 kg)  10/01/23 171 lb (77.6 kg)  04/06/23 167 lb 3.2 oz (75.8 kg)      Physical Exam Vitals reviewed.  Constitutional:      General: He is not in acute distress.    Appearance: He is not ill-appearing.  HENT:     Right Ear: Tympanic membrane normal.     Left Ear: Tympanic membrane normal.     Nose:     Comments: Thick yellow nasal mucus bilaterally    Mouth/Throat:     Mouth: Mucous membranes are moist.     Pharynx: Oropharynx is clear.  Cardiovascular:     Rate and Rhythm:  Normal rate and regular rhythm.  Pulmonary:     Effort: Pulmonary effort is normal. No respiratory distress.     Breath sounds: Normal breath sounds. No wheezing or rales.  Musculoskeletal:     Cervical back: Neck supple.  Lymphadenopathy:     Cervical: No cervical adenopathy.  Neurological:     Mental Status: He is alert.      No results found for any visits on 11/19/23.    The ASCVD Risk score (Arnett DK, et al., 2019) failed to calculate for the following reasons:   The 2019 ASCVD risk score is only valid for ages 88 to 41    Assessment & Plan:   Acute sinusitis.  He describes 3 weeks of progressive sinus symptoms and strong clinical suspicion for bacterial sinusitis.  Does have some thick purulent like secretions nasally bilaterally.  Start Augmentin  875 mg twice daily for 10 days.  Plenty fluids.  Follow-up for any persistent or worsening symptoms.  Wolm Scarlet, MD

## 2023-11-24 ENCOUNTER — Ambulatory Visit: Payer: Medicare Other | Admitting: Adult Health

## 2023-11-24 ENCOUNTER — Encounter: Payer: Self-pay | Admitting: Adult Health

## 2023-11-24 VITALS — BP 110/70 | HR 71 | Temp 97.5°F | Ht 67.0 in | Wt 169.0 lb

## 2023-11-24 DIAGNOSIS — R062 Wheezing: Secondary | ICD-10-CM

## 2023-11-24 DIAGNOSIS — B359 Dermatophytosis, unspecified: Secondary | ICD-10-CM

## 2023-11-24 DIAGNOSIS — J0110 Acute frontal sinusitis, unspecified: Secondary | ICD-10-CM

## 2023-11-24 MED ORDER — CLOTRIMAZOLE-BETAMETHASONE 1-0.05 % EX CREA: 1.0000 | TOPICAL_CREAM | Freq: Every day | CUTANEOUS | 0 refills | Status: DC

## 2023-11-24 MED ORDER — IPRATROPIUM-ALBUTEROL 0.5-2.5 (3) MG/3ML IN SOLN
3.0000 mL | Freq: Once | RESPIRATORY_TRACT | Status: AC
  Administered 2023-11-24: 3 mL via RESPIRATORY_TRACT

## 2023-11-24 MED ORDER — PREDNISONE 10 MG PO TABS
10.0000 mg | ORAL_TABLET | Freq: Every day | ORAL | 0 refills | Status: DC
Start: 2023-11-24 — End: 2023-12-22

## 2023-11-24 NOTE — Progress Notes (Signed)
 Subjective:    Patient ID: Ian [name], male    DOB: September 27, 1941, 83 y.o.   MRN: [id number]  HPI 83 year old male who  has a past medical history of Allergy, Apnea, sleep (07/28/07), Carotid artery stenosis, Coronary heart disease (2008), Diabetes mellitus, GERD (gastroesophageal reflux disease), Hyperlipidemia, Hypertension, and Hypospadias.  He presents to the office today for an acute issue.  He was seen by Dr. Darren Em 5 days ago for 3-week history of progressive sinusitis symptoms.  Was prescribed a 10-day worth of Augmentin  and is currently on day 5.  Reports that when he first started taking the Augmentin  seemed to help but he continues to have a cough, trouble getting a full breath, fatigue, and decreased appetite.  He has not had any fevers or chills.  He also needs a refill of Lotrisone  that he uses periodically for fungal infections   Review of Systems See HPI   Past Medical History:  Diagnosis Date   Allergy    Apnea, sleep 07/28/07   NPSG [place] , AHI 79.9   Carotid artery stenosis    mild 04/2010, 05/2012   Coronary heart disease 2008   Cath 2008, 60-70% proximal diagonal 1 stenosis,  RCA 40% stenosis.  nonischemic Lexiscan 03/2011   Diabetes mellitus    type II   GERD (gastroesophageal reflux disease)    Hyperlipidemia    Hypertension    Hypospadias     Social History   Socioeconomic History   Marital status: Married    Spouse name: Not on file   Number of children: 3   Years of education: Not on file   Highest education level: Master's degree (e.g., MA, MS, MEng, MEd, MSW, MBA)  Occupational History   Occupation: Retired Economist: RETIRED  Tobacco Use   Smoking status: Never   Smokeless tobacco: Never  Substance and Sexual Activity   Alcohol use: No    Alcohol/week: 0.0 standard drinks of alcohol   Drug use: No   Sexual activity: Not on file  Other Topics Concern   Not on file  Social History Narrative   Married.  Three children  from first marriage.  First wife died of ovarian cancer.     Retired, did work for [employer] for 44 years.    Social Drivers of Corporate investment banker Strain: Low Risk  (11/18/2023)   Overall Financial Resource Strain (CARDIA)    Difficulty of Paying Living Expenses: Not hard at all  Food Insecurity: No Food Insecurity (11/18/2023)   Hunger Vital Sign    Worried About Running Out of Food in the Last Year: Never true    Ran Out of Food in the Last Year: Never true  Transportation Needs: No Transportation Needs (11/18/2023)   PRAPARE - Administrator, Civil Service (Medical): No    Lack of Transportation (Non-Medical): No  Physical Activity: Insufficiently Active (11/18/2023)   Exercise Vital Sign    Days of Exercise per Week: 3 days    Minutes of Exercise per Session: 40 min  Stress: No Stress Concern Present (11/18/2023)   [employer] of Occupational Health - Occupational Stress Questionnaire    Feeling of Stress : Only a little  Social Connections: Socially Integrated (11/18/2023)   Social Connection and Isolation Panel [NHANES]    Frequency of Communication with Friends and Family: More than three times a week    Frequency of Social Gatherings with Friends and Family: Once a  week    Attends Religious Services: More than 4 times per year    Active Member of Clubs or Organizations: Yes    Attends Banker Meetings: More than 4 times per year    Marital Status: Married  Catering manager Violence: Unknown (12/24/2022)   Received from [employer], [facility]   HITS    Physically Hurt: Not on file    Insult or Talk Down To: Not on file    Threaten Physical Harm: Not on file    Scream or Curse: Not on file    Past Surgical History:  Procedure Laterality Date   EYE SURGERY     NASAL SINUS SURGERY     NISSEN FUNDOPLICATION     TONSILLECTOMY     VEIN LIGATION AND STRIPPING      Family History  Problem Relation Age of Onset   Coronary artery  disease Father 71       CABG   Dementia Mother     Allergies  Allergen Reactions   Iohexol      Desc: pt. states severe reaction, dr. told patient never to have the dye again     Current Outpatient Medications on File Prior to Visit  Medication Sig Dispense Refill   amoxicillin -clavulanate (AUGMENTIN ) 875-125 MG tablet Take 1 tablet by mouth 2 (two) times daily. 20 tablet 0   aspirin  81 MG tablet Take 1 tablet (81 mg total) by mouth daily. 90 tablet 3   Cholecalciferol (VITAMIN D3 PO) Take 10,000 Units by mouth once a week.     clotrimazole -betamethasone  (LOTRISONE ) cream Apply topically.     Coenzyme Q10 (CO Q10) 100 MG CAPS Take 1 tablet by mouth daily.     econazole nitrate  1 % cream Apply topically daily. 15 g 2   fexofenadine  (ALLEGRA ) 180 MG tablet Take 1 tablet (180 mg total) by mouth daily. 90 tablet 3   fluticasone  (FLONASE ) 50 MCG/ACT nasal spray USE 2 SPRAYS IN EACH NOSTRIL DAILY 48 g 3   gabapentin  (NEURONTIN ) 300 MG capsule TAKE 1 TO 2 CAPSULES IN THE EVENING 180 capsule 3   glipiZIDE  (GLUCOTROL  XL) 2.5 MG 24 hr tablet TAKE 1 TABLET DAILY WITH BREAKFAST 90 tablet 3   GLUCOSAMINE-CHONDROITIN PO Take by mouth.     hydrocortisone  2.5 % cream Apply topically 2 (two) times daily. 453.6 g 0   Lancets (ONETOUCH ULTRASOFT) lancets USE TO TEST BLOOD GLUCOSE TWICE DAILY 200 each 3   nitroGLYCERIN  (NITROSTAT ) 0.4 MG SL tablet DISSOLVE 1 TABLET UNDER THE TONGUE EVERY 5 MINUTES AS NEEDED FOR CHEST PAIN 75 tablet 14   olmesartan  (BENICAR ) 20 MG tablet TAKE 1 TABLET DAILY 90 tablet 3   Omega-3 Fatty Acids (FISH OIL  ULTRA) 1400 MG CAPS Take by mouth.     SYSTANE ULTRA 0.4-0.3 % SOLN      tamsulosin  (FLOMAX ) 0.4 MG CAPS capsule Take 1 capsule (0.4 mg total) by mouth daily. 90 capsule 3   traMADol  (ULTRAM ) 50 MG tablet TAKE 1 TABLET EVERY 8 HOURS AS NEEDED 90 tablet 2   zolpidem  (AMBIEN ) 5 MG tablet TAKE 1 TABLET AT BEDTIME AS NEEDED FOR SLEEP 90 tablet 0   rosuvastatin  (CRESTOR ) 40 MG  tablet Take 1 tablet (40 mg total) by mouth daily. 90 tablet 3   No current facility-administered medications on file prior to visit.    BP 110/70   Pulse 71   Temp (!) 97.5 F (36.4 C) (Oral)   Ht 5\' 7"  (1.702 m)  Wt 169 lb (76.7 kg)   SpO2 96%   BMI 26.47 kg/m       Objective:   Physical Exam Vitals and nursing note reviewed.  Constitutional:      Appearance: Normal appearance.  Cardiovascular:     Rate and Rhythm: Normal rate and regular rhythm.     Pulses: Normal pulses.     Heart sounds: Normal heart sounds.  Pulmonary:     Effort: Pulmonary effort is normal.     Breath sounds: No stridor. Examination of the right-upper field reveals wheezing. Examination of the left-upper field reveals wheezing. Examination of the right-middle field reveals wheezing. Examination of the left-middle field reveals wheezing. Examination of the right-lower field reveals wheezing. Examination of the left-lower field reveals wheezing. Wheezing present. No rhonchi.  Skin:    General: Skin is warm and dry.  Neurological:     General: No focal deficit present.     Mental Status: He is alert and oriented to person, place, and time.  Psychiatric:        Mood and Affect: Mood normal.        Behavior: Behavior normal.        Thought Content: Thought content normal.        Judgment: Judgment normal.       Assessment & Plan:  1. Acute non-recurrent frontal sinusitis (Primary) - Continue with Augmentin  until finished and follow up if symptoms not resolved   2. Wheezing - ipratropium-albuterol  (DUONEB) 0.5-2.5 (3) MG/3ML nebulizer solution 3 mL - predniSONE  (DELTASONE ) 10 MG tablet; Take 1 tablet (10 mg total) by mouth daily with breakfast.  Dispense: 5 tablet; Refill: 0 - Dueneb given in the office. Patient reported improvement in breathing after duoneb. On exam wheezing had resolved and he was able to move oxygen much easier.   3. Tinea - clotrimazole -betamethasone  (LOTRISONE ) cream; Apply  1 Application topically daily.  Dispense: 30 g; Refill: 0  Alto Atta, NP

## 2023-11-25 ENCOUNTER — Ambulatory Visit: Payer: Medicare Other

## 2023-11-25 DIAGNOSIS — R293 Abnormal posture: Secondary | ICD-10-CM | POA: Diagnosis not present

## 2023-11-25 DIAGNOSIS — M25611 Stiffness of right shoulder, not elsewhere classified: Secondary | ICD-10-CM | POA: Diagnosis not present

## 2023-11-25 DIAGNOSIS — M6281 Muscle weakness (generalized): Secondary | ICD-10-CM

## 2023-11-25 DIAGNOSIS — R252 Cramp and spasm: Secondary | ICD-10-CM | POA: Diagnosis not present

## 2023-11-25 DIAGNOSIS — G8929 Other chronic pain: Secondary | ICD-10-CM

## 2023-11-25 DIAGNOSIS — M25511 Pain in right shoulder: Secondary | ICD-10-CM | POA: Diagnosis not present

## 2023-11-25 NOTE — Therapy (Signed)
OUTPATIENT PHYSICAL THERAPY UPPER EXTREMITY TREATMENT   Patient Name: Ian [name] MRN: [id number] DOB:02-Oct-1941, 83 y.o., male Today's Date: 11/25/2023  END OF SESSION:  PT End of Session - 11/25/23 1146     Visit Number 6    Date for PT Re-Evaluation 12/07/23    Authorization Type MCR    Progress Note Due on Visit 10    PT Start Time 1103    PT Stop Time 1145    PT Time Calculation (min) 42 min    Activity Tolerance Patient tolerated treatment well    Behavior During Therapy [facility] for tasks assessed/performed               Past Medical History:  Diagnosis Date   Allergy    Apnea, sleep 07/28/07   NPSG [place], AHI 79.9   Carotid artery stenosis    mild 04/2010, 05/2012   Coronary heart disease 2008   Cath 2008, 60-70% proximal diagonal 1 stenosis,  RCA 40% stenosis.  nonischemic Lexiscan 03/2011   Diabetes mellitus    type II   GERD (gastroesophageal reflux disease)    Hyperlipidemia    Hypertension    Hypospadias    Past Surgical History:  Procedure Laterality Date   EYE SURGERY     NASAL SINUS SURGERY     NISSEN FUNDOPLICATION     TONSILLECTOMY     VEIN LIGATION AND STRIPPING     Patient Active Problem List   Diagnosis Date Noted   Dizziness 09/25/2020   CKD (chronic kidney disease), stage II 09/25/2020   Type 2 diabetes mellitus with complication, without long-term current use of insulin (HCC) 09/21/2019   Coronary artery disease involving native coronary artery of native heart without angina pectoris 03/23/2017   Bilateral carotid artery stenosis 03/23/2017   Dyslipidemia 03/23/2017   IBS (irritable bowel syndrome) 12/12/2014   Erectile dysfunction 01/19/2014   Diabetic polyneuropathy (HCC) 11/20/2013   Shortness of breath 09/26/2012   Chronic insomnia 03/25/2012   PALPITATIONS 11/19/2010   URINARY INCONTINENCE, PASSIVE, CONTINUOUS LEAKAGE 11/19/2010   Disorder resulting from impaired renal function 11/13/2009   VARICOSE VEINS, LOWER EXTREMITIES  09/17/2009   BPH with urinary obstruction 09/17/2009   RESTLESS LEG SYNDROME 02/12/2009   HIP PAIN, RIGHT 02/12/2009   DIAPHRAGMATIC DISORDER 10/26/2008   Diabetes mellitus without complication (HCC) 01/08/2008   Hyperlipidemia 01/08/2008   Essential hypertension 01/08/2008   Coronary atherosclerosis 01/08/2008   Allergic rhinitis 01/08/2008   Obstructive sleep apnea 01/08/2008    PCP: Shirline Frees, NP   REFERRING PROVIDER: Shirline Frees, NP   REFERRING DIAG: [phone] (ICD-10-CM) - Pain in right upper arm   THERAPY DIAG:  Chronic right shoulder pain  Stiffness of right shoulder, not elsewhere classified  Muscle weakness (generalized)  Abnormal posture  Rationale for Evaluation and Treatment: Rehabilitation  ONSET DATE: June 2024  SUBJECTIVE:  SUBJECTIVE STATEMENT: Patient reports he traveled a lot during the holidays.  Has a cold and not feeling all that great but shoulders are both doing well.  He reports his pain at 1-2/10 in the right shoulder.  Left shoulder is fine per patient.    Hand dominance: Right  PERTINENT HISTORY: CAD, DM, HTN, kidney issues  PAIN:  10/26/23 Are you having pain? Yes: NPRS scale: 0-5/10 Pain location: R deltoid area, R proximal forearm, R UT area Pain description: sharp Aggravating factors: IR, reaching OH, exercises Relieving factors: rest  PRECAUTIONS: None  RED FLAGS: None   WEIGHT BEARING RESTRICTIONS: No  FALLS:  Has patient fallen in last 6 months? No  LIVING ENVIRONMENT: Lives with: lives with their spouse Lives in: House/apartment Stairs: Yes: External: 1 steps; none Has following equipment at home: None  OCCUPATION: retired  PLOF: Independent  PATIENT GOALS: freedom of motion without pain  NEXT MD VISIT:   OBJECTIVE:  Note:  Objective measures were completed at Evaluation unless otherwise noted.  DIAGNOSTIC FINDINGS:  IMPRESSION: 1. Mild acromioclavicular degenerative change. 2. Mild subcortical cystic change in the lateral humeral head, nonspecific, but can be seen with rotator cuff pathology.  PATIENT SURVEYS:  FOTO 56 (goal 65)  5x sit to stand: 16.61 seconds with min UE support   COGNITION: Overall cognitive status: Within functional limits for tasks assessed     SENSATION: WFL  POSTURE: Rounded shoulders, forward head  UPPER EXTREMITY ROM: * marked pain  A/P ROM Right eval Left eval  Shoulder flexion 115/142* 132 A/ROM  Shoulder extension    Shoulder abduction 108/120* 108 A/ROM  Shoulder adduction    Shoulder internal rotation 56/70*   Shoulder external rotation 54/55*   Elbow flexion    Elbow extension    Wrist flexion    Wrist extension    Wrist ulnar deviation    Wrist radial deviation    Wrist pronation    Wrist supination    (Blank rows = not tested)  UPPER EXTREMITY MMT:  MMT Right eval Left eval  Shoulder flexion 4-   Shoulder extension 5   Shoulder abduction 4-   Shoulder adduction    Shoulder internal rotation 4+   Shoulder external rotation 4*   Middle trapezius    Lower trapezius    Elbow flexion 4   Elbow extension 5   Wrist flexion    Wrist extension 5   Wrist ulnar deviation    Wrist radial deviation    Wrist pronation    Wrist supination    Grip strength (lbs)    (Blank rows = not tested)  SHOULDER SPECIAL TESTS: Impingement tests: Neer impingement test: positive  and Hawkins/Kennedy impingement test: positive  Rotator cuff assessment: Empty can test: negative and Full can test: positive   JOINT MOBILITY TESTING:  Pain with AP mob, relief with PA  PALPATION:  Marked in SITS tendons, IS muscle, LH biceps tendon   TODAY'S TREATMENT:  DATE:  11/25/23 UBE 1.3x 6 minutes (3/3)-PT present to discuss progress  PROM right shoulder x 15 min Supine cane flexion 2x10 Sidelying ER 2# 2x10 bil Supine serratus punches 2# 2x10 seated shoulder flexion with 2 lbs 2 x 12 seated shoulder scaption with 2 lbs 2 x 12 Finger ladder: Rt flexion with 1# weight added x10 Shoulder extension green band 2x10 Sit to stand x5  11/16/23 UBE x 5 min level 1 PROM right shoulder x 15 min Verbally reviewed HEP and how aggressive to be with his stretching/ AAROM 3 way scapular stabilization with blue loop  x 5 each UE patient required frequent rest breaks 4 D ball rolls x 20 each right only with light blue plyo ball patient required frequent rest breaks Standing shoulder flexion with 2 lbs 2 x 10 Standing shoulder scaption with 2 lbs 2 x 10 Lat pull down with 30 lbs 2 x 10 Ice to right shoulder in supine hook lying x 10 min  10/26/23 NuStep: level 4x 6 minutes- PT present to discuss progress  Supine AAROM with cane: abduction x10 with10" hold-verbal and tactile cues  Supine shoulder flexion 2 x 10 with 2 lbs Supine serratus punch 2 x 10 with 2 lbs Shoulder extension and rows 2x10 with red band  Overhead pulleys: flexion x3 minutes, abduction  Side lying ER 2 x 10 with 2 lbs    PATIENT EDUCATION: Education details: PT eval findings, anticipated POC, initial HEP, and postural awareness  Person educated: Patient Education method: Explanation, Demonstration, and Handouts Education comprehension: verbalized understanding, returned demonstration, and verbal cues required  HOME EXERCISE PROGRAM: Access Code: [email or web address] URL: [email or web address] Date: 10/26/2023 Prepared by: Tresa Endo  Exercises - Seated Shoulder Flexion AAROM with Dowel  - 2 x daily - 7 x weekly - 1-2 sets - 10 reps - Seated Shoulder External Rotation AAROM with Cane and Hand in Neutral  - 2 x daily - 7 x weekly - 1-2 sets - 10 reps -  Seated Shoulder Abduction Towel Slide at Table Top (Mirrored)  - 2 x daily - 7 x weekly - 1-2 sets - 10 reps - Seated Scapular Retraction  - 1 x daily - 7 x weekly - 1 sets - 10 reps - 2-3 sec hold - Supine Shoulder Flexion Extension AAROM with Dowel  - 1 x daily - 7 x weekly - 1 sets - 20 reps - Supine Shoulder Abduction AAROM with Dowel  - 1 x daily - 7 x weekly - 1 sets - 20 reps - Supine Shoulder External Rotation in 45 Degrees Abduction AAROM with Dowel  - 1 x daily - 7 x weekly - 1 sets - 20 reps - Supine Shoulder Flexion Extension Full Range AROM  - 1 x daily - 7 x weekly - 2 sets - 10 reps - Single Arm Serratus Punches in Supine with Dumbbell  - 1 x daily - 7 x weekly - 2 sets - 10 reps - Sidelying Shoulder External Rotation  - 1 x daily - 7 x weekly - 2 sets - 10 reps - Standing Row with Anchored Resistance  - 1 x daily - 7 x weekly - 2 sets - 10 reps - Shoulder Extension with Resistance  - 1 x daily - 7 x weekly - 2 sets - 10 reps  ASSESSMENT:  CLINICAL IMPRESSION: Pt reports 85% improvement in bil shoulder pain since the start of care.  Pt has been sick so has not been able to do his exercises.  He seems, however, to be progressing with ROM and strength as well as function.  He is able to reach overhead with increased ease although this is still challenging and is challenged with reaching behind his back into IR.  He is able to do his ADL's with increased ease and overall pain level reports are low. 5x sit to stand is 16 seconds with min use of arms on thighs.  Pt is at falls risk and PT educated pt on sit to stand exercise to help to reduce this risk at home.  One more week of PT likely.  Patient will benefit from skilled PT to address the below impairments and improve overall function.     OBJECTIVE IMPAIRMENTS: decreased activity tolerance, decreased ROM, decreased strength, increased muscle spasms, impaired flexibility, impaired UE functional use, postural dysfunction, and pain.    ACTIVITY LIMITATIONS: bathing, dressing, reach over head, and hygiene/grooming  PARTICIPATION LIMITATIONS:  anything with OH activity   PERSONAL FACTORS: Age, Fitness, Time since onset of injury/illness/exacerbation, and 3+ comorbidities: DM, CKD, CAD  are also affecting patient's functional outcome.   REHAB POTENTIAL: Good  CLINICAL DECISION MAKING: Evolving/moderate complexity  EVALUATION COMPLEXITY: Moderate   GOALS: Goals reviewed with patient? Yes  SHORT TERM GOALS: Target date: 11/09/2023   Patient will be independent with initial HEP.  Baseline: 10/26/23 Goal status: MET  2.  Decreased shoulder pain with ADLs by 25%  Baseline: reduced pain today (10/26/23) Goal status:MET 11/16/23   3.  Balance assessed for fall risk Baseline: issued sit to stand for HEP Goal status: MET    LONG TERM GOALS: Target date: 12/07/2023   Patient will be independent with advanced/ongoing HEP to improve outcomes and carryover.  Baseline:  Goal status: INITIAL  2.  Patient will report 75% improvement in R shoulder pain to improve QOL.  Baseline: 85% (11/25/23) Goal status: MET  3.  Patient to improve R shoulder AROM to [facility] without pain provocation to allow for increased ease of ADLs.  Baseline:  Goal status: In Progress  4.  Patient will demonstrate improved UE strength to 4+/5.  Baseline:  Goal status: In Progress  5.  Patient will report 68 on FOTO to demonstrate improved functional ability.  Baseline:  Goal status: INITIAL  6.  Patient will be able to reach behind his back to dress without difficulty.   Baseline:  Goal status: INITIAL    PLAN:  PT FREQUENCY: 2x/week  PT DURATION: 8 weeks  PLANNED INTERVENTIONS: 97164- PT Re-evaluation, 97110-Therapeutic exercises, 97530- Therapeutic activity, 97112- Neuromuscular re-education, 97535- Self Care, 73220- Manual therapy, 97014- Electrical stimulation (unattended), [phone]- Ionotophoresis 4mg /ml Dexamethasone,  Patient/Family education, Balance training, Taping, Dry Needling, Joint mobilization, Spinal mobilization, Cryotherapy, and Moist heat  PLAN FOR NEXT SESSION:  continue with right shoulder strengthening and ROM, add more balance exercises    Lorrene Reid, PT 11/25/23 11:47 AM  [facility] [address] [place], [place] [address] Phone # [phone] Fax [phone]

## 2023-11-26 ENCOUNTER — Encounter: Payer: Self-pay | Admitting: Family Medicine

## 2023-11-30 ENCOUNTER — Ambulatory Visit: Payer: Medicare Other

## 2023-11-30 DIAGNOSIS — R252 Cramp and spasm: Secondary | ICD-10-CM | POA: Diagnosis not present

## 2023-11-30 DIAGNOSIS — G8929 Other chronic pain: Secondary | ICD-10-CM

## 2023-11-30 DIAGNOSIS — R293 Abnormal posture: Secondary | ICD-10-CM

## 2023-11-30 DIAGNOSIS — M25511 Pain in right shoulder: Secondary | ICD-10-CM | POA: Diagnosis not present

## 2023-11-30 DIAGNOSIS — M6281 Muscle weakness (generalized): Secondary | ICD-10-CM | POA: Diagnosis not present

## 2023-11-30 DIAGNOSIS — M25611 Stiffness of right shoulder, not elsewhere classified: Secondary | ICD-10-CM | POA: Diagnosis not present

## 2023-11-30 NOTE — Therapy (Signed)
OUTPATIENT PHYSICAL THERAPY UPPER EXTREMITY TREATMENT   Patient Name: Ian [name] MRN: [id number] DOB:1941-09-01, 83 y.o., male Today's Date: 11/30/2023  END OF SESSION:  PT End of Session - 11/30/23 1112     Visit Number 7    Date for PT Re-Evaluation 12/07/23    Authorization Type MCR    Progress Note Due on Visit 10    PT Start Time 1108    PT Stop Time 1146    PT Time Calculation (min) 38 min    Activity Tolerance Patient tolerated treatment well    Behavior During Therapy [place] [facility] for tasks assessed/performed               Past Medical History:  Diagnosis Date   Allergy    Apnea, sleep 07/28/07   NPSG [place], AHI 79.9   Carotid artery stenosis    mild 04/2010, 05/2012   Coronary heart disease 2008   Cath 2008, 60-70% proximal diagonal 1 stenosis,  RCA 40% stenosis.  nonischemic Lexiscan 03/2011   Diabetes mellitus    type II   GERD (gastroesophageal reflux disease)    Hyperlipidemia    Hypertension    Hypospadias    Past Surgical History:  Procedure Laterality Date   EYE SURGERY     NASAL SINUS SURGERY     NISSEN FUNDOPLICATION     TONSILLECTOMY     VEIN LIGATION AND STRIPPING     Patient Active Problem List   Diagnosis Date Noted   Dizziness 09/25/2020   CKD (chronic kidney disease), stage II 09/25/2020   Type 2 diabetes mellitus with complication, without long-term current use of insulin (HCC) 09/21/2019   Coronary artery disease involving native coronary artery of native heart without angina pectoris 03/23/2017   Bilateral carotid artery stenosis 03/23/2017   Dyslipidemia 03/23/2017   IBS (irritable bowel syndrome) 12/12/2014   Erectile dysfunction 01/19/2014   Diabetic polyneuropathy (HCC) 11/20/2013   Shortness of breath 09/26/2012   Chronic insomnia 03/25/2012   PALPITATIONS 11/19/2010   URINARY INCONTINENCE, PASSIVE, CONTINUOUS LEAKAGE 11/19/2010   Disorder resulting from impaired renal function 11/13/2009   VARICOSE VEINS, LOWER EXTREMITIES  09/17/2009   BPH with urinary obstruction 09/17/2009   RESTLESS LEG SYNDROME 02/12/2009   HIP PAIN, RIGHT 02/12/2009   DIAPHRAGMATIC DISORDER 10/26/2008   Diabetes mellitus without complication (HCC) 01/08/2008   Hyperlipidemia 01/08/2008   Essential hypertension 01/08/2008   Coronary atherosclerosis 01/08/2008   Allergic rhinitis 01/08/2008   Obstructive sleep apnea 01/08/2008    PCP: Shirline Frees, NP   REFERRING PROVIDER: Shirline Frees, NP   REFERRING DIAG: [phone] (ICD-10-CM) - Pain in right upper arm   THERAPY DIAG:  Chronic right shoulder pain  Stiffness of right shoulder, not elsewhere classified  Muscle weakness (generalized)  Cramp and spasm  Abnormal posture  Rationale for Evaluation and Treatment: Rehabilitation  ONSET DATE: June 2024  SUBJECTIVE:  SUBJECTIVE STATEMENT: Patient reports he traveled a lot during the holidays.  Has a cold and not feeling all that great but shoulders are both doing well.  He reports his pain at 1-2/10 in the right shoulder.  Left shoulder is fine per patient.    Hand dominance: Right  PERTINENT HISTORY: CAD, DM, HTN, kidney issues  PAIN:  10/26/23 Are you having pain? Yes: NPRS scale: 0-5/10 Pain location: R deltoid area, R proximal forearm, R UT area Pain description: sharp Aggravating factors: IR, reaching OH, exercises Relieving factors: rest  PRECAUTIONS: None  RED FLAGS: None   WEIGHT BEARING RESTRICTIONS: No  FALLS:  Has patient fallen in last 6 months? No  LIVING ENVIRONMENT: Lives with: lives with their spouse Lives in: House/apartment Stairs: Yes: External: 1 steps; none Has following equipment at home: None  OCCUPATION: retired  PLOF: Independent  PATIENT GOALS: freedom of motion without pain  NEXT MD VISIT:    OBJECTIVE:  Note: Objective measures were completed at Evaluation unless otherwise noted.  DIAGNOSTIC FINDINGS:  IMPRESSION: 1. Mild acromioclavicular degenerative change. 2. Mild subcortical cystic change in the lateral humeral head, nonspecific, but can be seen with rotator cuff pathology.  PATIENT SURVEYS:  FOTO 56 (goal 65)  5x sit to stand: 16.61 seconds with min UE support   COGNITION: Overall cognitive status: Within functional limits for tasks assessed     SENSATION: WFL  POSTURE: Rounded shoulders, forward head  UPPER EXTREMITY ROM: * marked pain  A/P ROM Right eval Left eval  Shoulder flexion 115/142* 132 A/ROM  Shoulder extension    Shoulder abduction 108/120* 108 A/ROM  Shoulder adduction    Shoulder internal rotation 56/70*   Shoulder external rotation 54/55*   Elbow flexion    Elbow extension    Wrist flexion    Wrist extension    Wrist ulnar deviation    Wrist radial deviation    Wrist pronation    Wrist supination    (Blank rows = not tested)  UPPER EXTREMITY MMT:  MMT Right eval Left eval  Shoulder flexion 4-   Shoulder extension 5   Shoulder abduction 4-   Shoulder adduction    Shoulder internal rotation 4+   Shoulder external rotation 4*   Middle trapezius    Lower trapezius    Elbow flexion 4   Elbow extension 5   Wrist flexion    Wrist extension 5   Wrist ulnar deviation    Wrist radial deviation    Wrist pronation    Wrist supination    Grip strength (lbs)    (Blank rows = not tested)  SHOULDER SPECIAL TESTS: Impingement tests: Neer impingement test: positive  and Hawkins/Kennedy impingement test: positive  Rotator cuff assessment: Empty can test: negative and Full can test: positive   JOINT MOBILITY TESTING:  Pain with AP mob, relief with PA  PALPATION:  Marked in SITS tendons, IS muscle, LH biceps tendon   TODAY'S TREATMENT:  DATE:  11/30/23 UBE 1.3x 6 minutes (3/3)-PT present to discuss progress  3 way scapular stabilization with yellow loop x 10 (right shoulder) 4 D ball rolls with light blue plyo ball x 20 each Balance activities: Step ups on balance pad x 20 Marching on balance pad x 20 Squats on balance pad 2 x 10 Gait training: worked on increased step length, heel strike and foot clearance  Rocker board x 2 min DF/PF Lunge to BOSU x 10 each LE fwd then laterals x 10 each LE Discussed DC plan for last visit: will incorporate some balance activities for HEP  11/25/23 UBE 1.3x 6 minutes (3/3)-PT present to discuss progress  PROM right shoulder x 15 min Supine cane flexion 2x10 Sidelying ER 2# 2x10 bil Supine serratus punches 2# 2x10 seated shoulder flexion with 2 lbs 2 x 12 seated shoulder scaption with 2 lbs 2 x 12 Finger ladder: Rt flexion with 1# weight added x10 Shoulder extension green band 2x10 Sit to stand x5  11/16/23 UBE x 5 min level 1 PROM right shoulder x 15 min Verbally reviewed HEP and how aggressive to be with his stretching/ AAROM 3 way scapular stabilization with blue loop  x 5 each UE patient required frequent rest breaks 4 D ball rolls x 20 each right only with light blue plyo ball patient required frequent rest breaks Standing shoulder flexion with 2 lbs 2 x 10 Standing shoulder scaption with 2 lbs 2 x 10 Lat pull down with 30 lbs 2 x 10 Ice to right shoulder in supine hook lying x 10 min   PATIENT EDUCATION: Education details: PT eval findings, anticipated POC, initial HEP, and postural awareness  Person educated: Patient Education method: Explanation, Demonstration, and Handouts Education comprehension: verbalized understanding, returned demonstration, and verbal cues required  HOME EXERCISE PROGRAM: Access Code: [email or web address] URL: [email or web address] Date: 10/26/2023 Prepared by: Tresa Endo  Exercises - Seated  Shoulder Flexion AAROM with Dowel  - 2 x daily - 7 x weekly - 1-2 sets - 10 reps - Seated Shoulder External Rotation AAROM with Cane and Hand in Neutral  - 2 x daily - 7 x weekly - 1-2 sets - 10 reps - Seated Shoulder Abduction Towel Slide at Table Top (Mirrored)  - 2 x daily - 7 x weekly - 1-2 sets - 10 reps - Seated Scapular Retraction  - 1 x daily - 7 x weekly - 1 sets - 10 reps - 2-3 sec hold - Supine Shoulder Flexion Extension AAROM with Dowel  - 1 x daily - 7 x weekly - 1 sets - 20 reps - Supine Shoulder Abduction AAROM with Dowel  - 1 x daily - 7 x weekly - 1 sets - 20 reps - Supine Shoulder External Rotation in 45 Degrees Abduction AAROM with Dowel  - 1 x daily - 7 x weekly - 1 sets - 20 reps - Supine Shoulder Flexion Extension Full Range AROM  - 1 x daily - 7 x weekly - 2 sets - 10 reps - Single Arm Serratus Punches in Supine with Dumbbell  - 1 x daily - 7 x weekly - 2 sets - 10 reps - Sidelying Shoulder External Rotation  - 1 x daily - 7 x weekly - 2 sets - 10 reps - Standing Row with Anchored Resistance  - 1 x daily - 7 x weekly - 2 sets - 10 reps - Shoulder Extension with Resistance  - 1 x daily - 7 x weekly - 2 sets -  10 reps  ASSESSMENT:  CLINICAL IMPRESSION: Patient has made excellent progress and is no longer having right shoulder pain. He continues to have some gait instability and "light headedness".  We worked on gait today as he tends to have short step length with poor foot clearance and limited heel strike.  He was encouraged to work on increased step length, heel strike and proper foot clearance.   One visit left.  We will review all of HEP and give some balance exercises for home.  Patient will benefit from his final skilled PT visit to address the below impairments and improve overall function.     OBJECTIVE IMPAIRMENTS: decreased activity tolerance, decreased ROM, decreased strength, increased muscle spasms, impaired flexibility, impaired UE functional use, postural  dysfunction, and pain.   ACTIVITY LIMITATIONS: bathing, dressing, reach over head, and hygiene/grooming  PARTICIPATION LIMITATIONS:  anything with OH activity   PERSONAL FACTORS: Age, Fitness, Time since onset of injury/illness/exacerbation, and 3+ comorbidities: DM, CKD, CAD  are also affecting patient's functional outcome.   REHAB POTENTIAL: Good  CLINICAL DECISION MAKING: Evolving/moderate complexity  EVALUATION COMPLEXITY: Moderate   GOALS: Goals reviewed with patient? Yes  SHORT TERM GOALS: Target date: 11/09/2023   Patient will be independent with initial HEP.  Baseline: 10/26/23 Goal status: MET  2.  Decreased shoulder pain with ADLs by 25%  Baseline: reduced pain today (10/26/23) Goal status:MET 11/16/23   3.  Balance assessed for fall risk Baseline: issued sit to stand for HEP Goal status: MET    LONG TERM GOALS: Target date: 12/07/2023   Patient will be independent with advanced/ongoing HEP to improve outcomes and carryover.  Baseline:  Goal status: MET 11/30/23  2.  Patient will report 75% improvement in R shoulder pain to improve QOL.  Baseline: 85% (11/25/23) Goal status: MET  3.  Patient to improve R shoulder AROM to [facility] without pain provocation to allow for increased ease of ADLs.  Baseline:  Goal status: MET 11/30/23  4.  Patient will demonstrate improved UE strength to 4+/5.  Baseline:  Goal status: MET 11/30/23  5.  Patient will report 75 on FOTO to demonstrate improved functional ability.  Baseline:  Goal status: INITIAL  6.  Patient will be able to reach behind his back to dress without difficulty.   Baseline:  Goal status: INITIAL    PLAN:  PT FREQUENCY: 2x/week  PT DURATION: 8 weeks  PLANNED INTERVENTIONS: 97164- PT Re-evaluation, 97110-Therapeutic exercises, 97530- Therapeutic activity, 97112- Neuromuscular re-education, 97535- Self Care, 16109- Manual therapy, 97014- Electrical stimulation (unattended), 97033- Ionotophoresis 4mg /ml  Dexamethasone, Patient/Family education, Balance training, Taping, Dry Needling, Joint mobilization, Spinal mobilization, Cryotherapy, and Moist heat  PLAN FOR NEXT SESSION:  Review HEP for right shoulder strengthening and ROM, add more balance exercises to [employer] B. Avy Barlett, PT 11/30/23 11:53 AM [facility] [address] [place], [place] [address] Phone # [phone] Fax [phone]

## 2023-12-03 ENCOUNTER — Ambulatory Visit: Payer: Medicare Other

## 2023-12-03 ENCOUNTER — Other Ambulatory Visit: Payer: Self-pay | Admitting: Adult Health

## 2023-12-03 DIAGNOSIS — M25511 Pain in right shoulder: Secondary | ICD-10-CM | POA: Diagnosis not present

## 2023-12-03 DIAGNOSIS — R252 Cramp and spasm: Secondary | ICD-10-CM | POA: Diagnosis not present

## 2023-12-03 DIAGNOSIS — R293 Abnormal posture: Secondary | ICD-10-CM

## 2023-12-03 DIAGNOSIS — R2681 Unsteadiness on feet: Secondary | ICD-10-CM

## 2023-12-03 DIAGNOSIS — G8929 Other chronic pain: Secondary | ICD-10-CM | POA: Diagnosis not present

## 2023-12-03 DIAGNOSIS — M25611 Stiffness of right shoulder, not elsewhere classified: Secondary | ICD-10-CM | POA: Diagnosis not present

## 2023-12-03 DIAGNOSIS — M6281 Muscle weakness (generalized): Secondary | ICD-10-CM

## 2023-12-03 DIAGNOSIS — R29898 Other symptoms and signs involving the musculoskeletal system: Secondary | ICD-10-CM

## 2023-12-03 NOTE — Therapy (Signed)
OUTPATIENT PHYSICAL THERAPY UPPER EXTREMITY TREATMENT  Patient Name: Ian [name] MRN: [id number] DOB:[birth year], 83 y.o., male Today's Date: 12/03/2023  END OF SESSION:  PT End of Session - 12/03/23 1016     Visit Number 8    Date for PT Re-Evaluation 12/07/23    Authorization Type MCR    Progress Note Due on Visit 10    PT Start Time 1016    Activity Tolerance Patient tolerated treatment well    Behavior During Therapy [facility] for tasks assessed/performed               Past Medical History:  Diagnosis Date   Allergy    Apnea, sleep 07/28/07   NPSG [place], AHI 79.9   Carotid artery stenosis    mild 04/2010, 05/2012   Coronary heart disease 2008   Cath 2008, 60-70% proximal diagonal 1 stenosis,  RCA 40% stenosis.  nonischemic Lexiscan 03/2011   Diabetes mellitus    type II   GERD (gastroesophageal reflux disease)    Hyperlipidemia    Hypertension    Hypospadias    Past Surgical History:  Procedure Laterality Date   EYE SURGERY     NASAL SINUS SURGERY     NISSEN FUNDOPLICATION     TONSILLECTOMY     VEIN LIGATION AND STRIPPING     Patient Active Problem List   Diagnosis Date Noted   Dizziness 09/25/2020   CKD (chronic kidney disease), stage II 09/25/2020   Type 2 diabetes mellitus with complication, without long-term current use of insulin (HCC) 09/21/2019   Coronary artery disease involving native coronary artery of native heart without angina pectoris 03/23/2017   Bilateral carotid artery stenosis 03/23/2017   Dyslipidemia 03/23/2017   IBS (irritable bowel syndrome) 12/12/2014   Erectile dysfunction 01/19/2014   Diabetic polyneuropathy (HCC) 11/20/2013   Shortness of breath 09/26/2012   Chronic insomnia 03/25/2012   PALPITATIONS 11/19/2010   URINARY INCONTINENCE, PASSIVE, CONTINUOUS LEAKAGE 11/19/2010   Disorder resulting from impaired renal function 11/13/2009   VARICOSE VEINS, LOWER EXTREMITIES 09/17/2009   BPH with urinary obstruction 09/17/2009    RESTLESS LEG SYNDROME 02/12/2009   HIP PAIN, RIGHT 02/12/2009   DIAPHRAGMATIC DISORDER 10/26/2008   Diabetes mellitus without complication (HCC) 01/08/2008   Hyperlipidemia 01/08/2008   Essential hypertension 01/08/2008   Coronary atherosclerosis 01/08/2008   Allergic rhinitis 01/08/2008   Obstructive sleep apnea 01/08/2008    PCP: Shirline Frees, NP   REFERRING PROVIDER: Shirline Frees, NP   REFERRING DIAG: [phone] (ICD-10-CM) - Pain in right upper arm   THERAPY DIAG:  Chronic right shoulder pain  Stiffness of right shoulder, not elsewhere classified  Muscle weakness (generalized)  Cramp and spasm  Abnormal posture  Chronic left shoulder pain  Rationale for Evaluation and Treatment: Rehabilitation  ONSET DATE: June 2024  SUBJECTIVE:  SUBJECTIVE STATEMENT: Patient reports he is no longer having shoulder pain.  He would like to work on balance and have some exercises to work on balance and leg strength if Dr. Denyse Amass agrees.        Hand dominance: Right  PERTINENT HISTORY: CAD, DM, HTN, kidney issues  PAIN:  12/03/23 Are you having pain? Yes: NPRS scale: 0/10 Pain location: R deltoid area, R proximal forearm, R UT area Pain description: sharp Aggravating factors: IR, reaching OH, exercises Relieving factors: rest  PRECAUTIONS: None  RED FLAGS: None   WEIGHT BEARING RESTRICTIONS: No  FALLS:  Has patient fallen in last 6 months? No  LIVING ENVIRONMENT: Lives with: lives with their spouse Lives in: House/apartment Stairs: Yes: External: 1 steps; none Has following equipment at home: None  OCCUPATION: retired  PLOF: Independent  PATIENT GOALS: freedom of motion without pain  NEXT MD VISIT:   OBJECTIVE:  Note: Objective measures were completed at Evaluation unless  otherwise noted.  DIAGNOSTIC FINDINGS:  IMPRESSION: 1. Mild acromioclavicular degenerative change. 2. Mild subcortical cystic change in the lateral humeral head, nonspecific, but can be seen with rotator cuff pathology.  PATIENT SURVEYS:  Initial: FOTO 56 (goal 65)  12/03/23: goal was 65, score today was 77  Initial eval: 5x sit to stand: 16.61 seconds with min UE support  TUG deferred  12/03/23: 5x sit to stand: 9.67 seconds with min UE support  TUG: 9.44 sec  COGNITION: Overall cognitive status: Within functional limits for tasks assessed     SENSATION: WFL  POSTURE: Rounded shoulders, forward head  UPPER EXTREMITY ROM: * marked pain  A/P ROM Right eval Right 12/03/23 Left eval Left 12/03/23  Shoulder flexion 115/142* WFL 132 A/ROM [facility]  Shoulder extension      Shoulder abduction 108/120* WFL 108 A/ROM [facility]  Shoulder adduction      Shoulder internal rotation 56/70* WFL    Shoulder external rotation 54/55* WFL    Elbow flexion      Elbow extension      Wrist flexion      Wrist extension      Wrist ulnar deviation      Wrist radial deviation      Wrist pronation      Wrist supination      (Blank rows = not tested)  UPPER EXTREMITY MMT:  MMT Right eval Right 12/03/23 Left eval  Shoulder flexion 4- 4+   Shoulder extension 5 5   Shoulder abduction 4- 4+   Shoulder adduction     Shoulder internal rotation 4+ 5   Shoulder external rotation 4* 4+   Middle trapezius     Lower trapezius     Elbow flexion 4 4+   Elbow extension 5 5   Wrist flexion     Wrist extension 5 5   Wrist ulnar deviation     Wrist radial deviation     Wrist pronation     Wrist supination     Grip strength (lbs)     (Blank rows = not tested)  SHOULDER SPECIAL TESTS: Impingement tests: Neer impingement test: positive  and Hawkins/Kennedy impingement test: positive  Rotator cuff assessment: Empty can test: negative and Full can test: positive   JOINT MOBILITY TESTING:  Pain with  AP mob, relief with PA  PALPATION:  Marked in SITS tendons, IS muscle, LH biceps tendon   TODAY'S TREATMENT:  DATE:  12/03/23 DC assessment for right shoulder completed Reviewed some strategies for balance and fall prevention for home and issued handouts Discussed his concerns for fall risk and feeling unsteady, contacted referring MD to discuss possible referral for gait, balance and LE strength MD replied immediately and will send over new referral - will recertify to evaluate for new referral   11/30/23 UBE 1.3x 6 minutes (3/3)-PT present to discuss progress  3 way scapular stabilization with yellow loop x 10 (right shoulder) 4 D ball rolls with light blue plyo ball x 20 each Balance activities: Step ups on balance pad x 20 Marching on balance pad x 20 Squats on balance pad 2 x 10 Gait training: worked on increased step length, heel strike and foot clearance  Rocker board x 2 min DF/PF Lunge to BOSU x 10 each LE fwd then laterals x 10 each LE Discussed DC plan for last visit: will incorporate some balance activities for HEP  11/25/23 UBE 1.3x 6 minutes (3/3)-PT present to discuss progress  PROM right shoulder x 15 min Supine cane flexion 2x10 Sidelying ER 2# 2x10 bil Supine serratus punches 2# 2x10 seated shoulder flexion with 2 lbs 2 x 12 seated shoulder scaption with 2 lbs 2 x 12 Finger ladder: Rt flexion with 1# weight added x10 Shoulder extension green band 2x10 Sit to stand x5   PATIENT EDUCATION: Education details: PT eval findings, anticipated POC, initial HEP, and postural awareness  Person educated: Patient Education method: Explanation, Demonstration, and Handouts Education comprehension: verbalized understanding, returned demonstration, and verbal cues required  HOME EXERCISE PROGRAM: Access Code: [email or web address] URL:  [email or web address] Date: 10/26/2023 Prepared by: Tresa Endo  Exercises - Seated Shoulder Flexion AAROM with Dowel  - 2 x daily - 7 x weekly - 1-2 sets - 10 reps - Seated Shoulder External Rotation AAROM with Cane and Hand in Neutral  - 2 x daily - 7 x weekly - 1-2 sets - 10 reps - Seated Shoulder Abduction Towel Slide at Table Top (Mirrored)  - 2 x daily - 7 x weekly - 1-2 sets - 10 reps - Seated Scapular Retraction  - 1 x daily - 7 x weekly - 1 sets - 10 reps - 2-3 sec hold - Supine Shoulder Flexion Extension AAROM with Dowel  - 1 x daily - 7 x weekly - 1 sets - 20 reps - Supine Shoulder Abduction AAROM with Dowel  - 1 x daily - 7 x weekly - 1 sets - 20 reps - Supine Shoulder External Rotation in 45 Degrees Abduction AAROM with Dowel  - 1 x daily - 7 x weekly - 1 sets - 20 reps - Supine Shoulder Flexion Extension Full Range AROM  - 1 x daily - 7 x weekly - 2 sets - 10 reps - Single Arm Serratus Punches in Supine with Dumbbell  - 1 x daily - 7 x weekly - 2 sets - 10 reps - Sidelying Shoulder External Rotation  - 1 x daily - 7 x weekly - 2 sets - 10 reps - Standing Row with Anchored Resistance  - 1 x daily - 7 x weekly - 2 sets - 10 reps - Shoulder Extension with Resistance  - 1 x daily - 7 x weekly - 2 sets - 10 reps  ASSESSMENT:  CLINICAL IMPRESSION: Patient has made excellent progress and is no longer having right shoulder pain. He continues to have some gait instability and "light headedness".  We discussed his concerns over this and he  inquired if he would benefit from continuing PT with new referral to work on his balance and LE strength.  He would certainly benefit from this.  He has met all of his goals for his initial diagnosis.  Contacted referring MD to request referral for balance and fall prevention along with LE weakness.  We will do a new assessment for this next visit and proceed with new POC.    OBJECTIVE IMPAIRMENTS: decreased activity tolerance, decreased ROM,  decreased strength, increased muscle spasms, impaired flexibility, impaired UE functional use, postural dysfunction, and pain.   ACTIVITY LIMITATIONS: bathing, dressing, reach over head, and hygiene/grooming  PARTICIPATION LIMITATIONS:  anything with OH activity   PERSONAL FACTORS: Age, Fitness, Time since onset of injury/illness/exacerbation, and 3+ comorbidities: DM, CKD, CAD  are also affecting patient's functional outcome.   REHAB POTENTIAL: Good  CLINICAL DECISION MAKING: Evolving/moderate complexity  EVALUATION COMPLEXITY: Moderate   GOALS: Goals reviewed with patient? Yes  SHORT TERM GOALS: Target date: 11/09/2023   Patient will be independent with initial HEP.  Baseline: 10/26/23 Goal status: MET  2.  Decreased shoulder pain with ADLs by 25%  Baseline: reduced pain today (10/26/23) Goal status:MET 11/16/23   3.  Balance assessed for fall risk Baseline: issued sit to stand for HEP Goal status: MET    LONG TERM GOALS: Target date: 12/07/2023   Patient will be independent with advanced/ongoing HEP to improve outcomes and carryover.  Baseline:  Goal status: MET 11/30/23  2.  Patient will report 75% improvement in R shoulder pain to improve QOL.  Baseline: 85% (11/25/23) Goal status: MET  3.  Patient to improve R shoulder AROM to [facility] without pain provocation to allow for increased ease of ADLs.  Baseline:  Goal status: MET 11/30/23  4.  Patient will demonstrate improved UE strength to 4+/5.  Baseline:  Goal status: MET 11/30/23  5.  Patient will report 44 on FOTO to demonstrate improved functional ability.  Baseline:  Goal status: MET 12/03/23  6.  Patient will be able to reach behind his back to dress without difficulty.   Baseline:  Goal status: MET 12/03/23    PLAN:  PT FREQUENCY: 2x/week  PT DURATION: 8 weeks  PLANNED INTERVENTIONS: 97164- PT Re-evaluation, 97110-Therapeutic exercises, 97530- Therapeutic activity, 97112- Neuromuscular re-education,  97535- Self Care, 16109- Manual therapy, 97014- Electrical stimulation (unattended), 97033- Ionotophoresis 4mg /ml Dexamethasone, Patient/Family education, Balance training, Taping, Dry Needling, Joint mobilization, Spinal mobilization, Cryotherapy, and Moist heat  PLAN FOR NEXT SESSION:  ERO or new eval for balance, gait, fall prevention and LE weakness.  MD will be sending new referral.    Victorino Dike B. Zaidan Keeble, PT 12/03/23 11:29 AM [facility] [address] [place], [place] [address] Phone # [phone] Fax [phone]

## 2023-12-14 ENCOUNTER — Encounter: Payer: Self-pay | Admitting: Family Medicine

## 2023-12-14 ENCOUNTER — Ambulatory Visit: Payer: Medicare Other | Attending: Adult Health

## 2023-12-14 DIAGNOSIS — M6281 Muscle weakness (generalized): Secondary | ICD-10-CM | POA: Insufficient documentation

## 2023-12-14 DIAGNOSIS — R293 Abnormal posture: Secondary | ICD-10-CM | POA: Diagnosis not present

## 2023-12-14 DIAGNOSIS — R252 Cramp and spasm: Secondary | ICD-10-CM | POA: Insufficient documentation

## 2023-12-14 DIAGNOSIS — R2681 Unsteadiness on feet: Secondary | ICD-10-CM | POA: Insufficient documentation

## 2023-12-14 DIAGNOSIS — R29898 Other symptoms and signs involving the musculoskeletal system: Secondary | ICD-10-CM | POA: Insufficient documentation

## 2023-12-14 DIAGNOSIS — R262 Difficulty in walking, not elsewhere classified: Secondary | ICD-10-CM | POA: Diagnosis not present

## 2023-12-14 NOTE — Therapy (Signed)
 OUTPATIENT PHYSICAL THERAPY BALANCE ASSESSMENT  Patient Name: Ian [name] MRN: [id number] DOB:Mar 21, 1941, 83 y.o., male Today's Date: 12/15/2023  END OF SESSION:  PT End of Session - 12/14/23 1521     Visit Number 9    Date for PT Re-Evaluation 01/31/24    Authorization Type MCR    Progress Note Due on Visit 20    PT Start Time 1108    PT Stop Time 1147    PT Time Calculation (min) 39 min    Activity Tolerance Patient tolerated treatment well    Behavior During Therapy [facility] for tasks assessed/performed                Past Medical History:  Diagnosis Date   Allergy    Apnea, sleep 07/28/07   NPSG [place] , AHI 79.9   Carotid artery stenosis    mild 04/2010, 05/2012   Coronary heart disease 2008   Cath 2008, 60-70% proximal diagonal 1 stenosis,  RCA 40% stenosis.  nonischemic Lexiscan 03/2011   Diabetes mellitus    type II   GERD (gastroesophageal reflux disease)    Hyperlipidemia    Hypertension    Hypospadias    Past Surgical History:  Procedure Laterality Date   EYE SURGERY     NASAL SINUS SURGERY     NISSEN FUNDOPLICATION     TONSILLECTOMY     VEIN LIGATION AND STRIPPING     Patient Active Problem List   Diagnosis Date Noted   Dizziness 09/25/2020   CKD (chronic kidney disease), stage II 09/25/2020   Type 2 diabetes mellitus with complication, without long-term current use of insulin (HCC) 09/21/2019   Coronary artery disease involving native coronary artery of native heart without angina pectoris 03/23/2017   Bilateral carotid artery stenosis 03/23/2017   Dyslipidemia 03/23/2017   IBS (irritable bowel syndrome) 12/12/2014   Erectile dysfunction 01/19/2014   Diabetic polyneuropathy (HCC) 11/20/2013   Shortness of breath 09/26/2012   Chronic insomnia 03/25/2012   PALPITATIONS 11/19/2010   URINARY INCONTINENCE, PASSIVE, CONTINUOUS LEAKAGE 11/19/2010   Disorder resulting from impaired renal function 11/13/2009   VARICOSE VEINS, LOWER EXTREMITIES  09/17/2009   BPH with urinary obstruction 09/17/2009   RESTLESS LEG SYNDROME 02/12/2009   HIP PAIN, RIGHT 02/12/2009   DIAPHRAGMATIC DISORDER 10/26/2008   Diabetes mellitus without complication (HCC) 01/08/2008   Hyperlipidemia 01/08/2008   Essential hypertension 01/08/2008   Coronary atherosclerosis 01/08/2008   Allergic rhinitis 01/08/2008   Obstructive sleep apnea 01/08/2008    PCP: Merna Huxley, NP   REFERRING PROVIDER: Merna Huxley, NP   REFERRING DIAG: Current: Balance and Falls risk Initial: M79.621 (ICD-10-CM) - Pain in right upper arm   THERAPY DIAG:  Unsteadiness on feet  Difficulty in walking, not elsewhere classified  Muscle weakness (generalized)  Rationale for Evaluation and Treatment: Rehabilitation  ONSET DATE: June 2024  SUBJECTIVE:  SUBJECTIVE STATEMENT: Patient reports he has experienced light headedness for about 6-7 years.  We have been addressing his shoulders for the past several weeks.  He has met all of his goals and we are discharging the treatment for his shoulders.  We are assessing his balance and fall risk today.          Hand dominance: Right  PERTINENT HISTORY: CAD, DM, HTN, kidney issues  PAIN:  12/14/23 Are you having pain? Yes: NPRS scale: 0/10 Pain location: R deltoid area, R proximal forearm, R UT area Pain description: sharp Aggravating factors: IR, reaching OH, exercises Relieving factors: rest  PRECAUTIONS: None  RED FLAGS: None   WEIGHT BEARING RESTRICTIONS: No  FALLS:  Has patient fallen in last 6 months? No  LIVING ENVIRONMENT: Lives with: lives with their spouse Lives in: House/apartment Stairs: Yes: External: 1 steps; none Has following equipment at home: None  OCCUPATION: retired  PLOF: Independent  PATIENT GOALS: freedom  of motion without pain  NEXT MD VISIT:   OBJECTIVE:  Note: Objective measures were completed at Evaluation unless otherwise noted.  DIAGNOSTIC FINDINGS:  IMPRESSION: 1. Mild acromioclavicular degenerative change. 2. Mild subcortical cystic change in the lateral humeral head, nonspecific, but can be seen with rotator cuff pathology.  PATIENT SURVEYS:  Initial: FOTO 56 (goal 65)  12/03/23: goal was 65, score today was 77  Initial eval: 5x sit to stand: 16.61 seconds with min UE support  TUG deferred  12/03/23: 5x sit to stand: 9.67 seconds with min UE support  TUG: 9.44 sec  COGNITION: Overall cognitive status: Within functional limits for tasks assessed     SENSATION: WFL  POSTURE: Rounded shoulders, forward head  UPPER EXTREMITY ROM: * marked pain  A/P ROM Right eval Right 12/03/23 Left eval Left 12/03/23  Shoulder flexion 115/142* WFL 132 A/ROM [facility]  Shoulder extension      Shoulder abduction 108/120* WFL 108 A/ROM [facility]  Shoulder adduction      Shoulder internal rotation 56/70* WFL    Shoulder external rotation 54/55* WFL    Elbow flexion      Elbow extension      Wrist flexion      Wrist extension      Wrist ulnar deviation      Wrist radial deviation      Wrist pronation      Wrist supination      (Blank rows = not tested)  UPPER EXTREMITY MMT:  MMT Right eval Right 12/03/23 Left eval  Shoulder flexion 4- 4+   Shoulder extension 5 5   Shoulder abduction 4- 4+   Shoulder adduction     Shoulder internal rotation 4+ 5   Shoulder external rotation 4* 4+   Middle trapezius     Lower trapezius     Elbow flexion 4 4+   Elbow extension 5 5   Wrist flexion     Wrist extension 5 5   Wrist ulnar deviation     Wrist radial deviation     Wrist pronation     Wrist supination     Grip strength (lbs)     (Blank rows = not tested)  SHOULDER SPECIAL TESTS: Impingement tests: Neer impingement test: positive  and Hawkins/Kennedy impingement test: positive   Rotator cuff assessment: Empty can test: negative and Full can test: positive   JOINT MOBILITY TESTING:  Pain with AP mob, relief with PA  PALPATION:  Marked in SITS tendons, IS muscle, LH biceps tendon  BERG BALANCE:   Modified CTSIB: floor eo: normal no sway         Floor ec: normal no sway                   Balance pad : eo needs help getting up then full 20 sec        Balance pad: ec under 5 sec  TODAY'S TREATMENT:                                                                                                                                         DATE:  12/14/23 Assessment for balance and falls new diagnosis Hip matrix 2 x 10 25# bilateral : extension and abduction Education on various reasons for dizziness and how to manage this at home including monitoring BP, drinking water, and slow transitions Cone touches at counter: heavy verbal and visual cues for correct ankle, knee, hip and core strategies 3 x 10 each LE Sit to stand 2 x 10 Educated on importance of posture and step length  12/03/23 DC assessment for right shoulder completed Reviewed some strategies for balance and fall prevention for home and issued handouts Discussed his concerns for fall risk and feeling unsteady, contacted referring MD to discuss possible referral for gait, balance and LE strength MD replied immediately and will send over new referral - will recertify to evaluate for new referral   11/30/23 UBE 1.3x 6 minutes (3/3)-PT present to discuss progress  3 way scapular stabilization with yellow loop x 10 (right shoulder) 4 D ball rolls with light blue plyo ball x 20 each Balance activities: Step ups on balance pad x 20 Marching on balance pad x 20 Squats on balance pad 2 x 10 Gait training: worked on increased step length, heel strike and foot clearance  Rocker board x 2 min DF/PF Lunge to BOSU x 10 each LE fwd then laterals x 10 each LE Discussed DC plan for last visit: will incorporate some  balance activities for HEP  PATIENT EDUCATION: Education details: PT eval findings, anticipated POC, initial HEP, and postural awareness  Person educated: Patient Education method: Explanation, Demonstration, and Handouts Education comprehension: verbalized understanding, returned demonstration, and verbal cues required  HOME EXERCISE PROGRAM: Access Code: [email or web address] URL: [email or web address] Date: 10/26/2023 Prepared by: Burnard  Exercises - Seated Shoulder Flexion AAROM with Dowel  - 2 x daily - 7 x weekly - 1-2 sets - 10 reps - Seated Shoulder External Rotation AAROM with Cane and Hand in Neutral  - 2 x daily - 7 x weekly - 1-2 sets - 10 reps - Seated Shoulder Abduction Towel Slide at Table Top (Mirrored)  - 2 x daily - 7 x weekly - 1-2 sets - 10 reps - Seated Scapular Retraction  - 1 x daily - 7 x weekly - 1 sets - 10 reps - 2-3 sec hold - Supine Shoulder  Flexion Extension AAROM with Dowel  - 1 x daily - 7 x weekly - 1 sets - 20 reps - Supine Shoulder Abduction AAROM with Dowel  - 1 x daily - 7 x weekly - 1 sets - 20 reps - Supine Shoulder External Rotation in 45 Degrees Abduction AAROM with Dowel  - 1 x daily - 7 x weekly - 1 sets - 20 reps - Supine Shoulder Flexion Extension Full Range AROM  - 1 x daily - 7 x weekly - 2 sets - 10 reps - Single Arm Serratus Punches in Supine with Dumbbell  - 1 x daily - 7 x weekly - 2 sets - 10 reps - Sidelying Shoulder External Rotation  - 1 x daily - 7 x weekly - 2 sets - 10 reps - Standing Row with Anchored Resistance  - 1 x daily - 7 x weekly - 2 sets - 10 reps - Shoulder Extension with Resistance  - 1 x daily - 7 x weekly - 2 sets - 10 reps  ASSESSMENT:  CLINICAL IMPRESSION: Completed Toure's balance assessment.  He demonstrates good static balance on stable and unstable surfaces but dynamic balance is poor.  He also has limited hip strength and poor posture which we discussed with him today.  He presents with weakness in hip  extensors and abductors, hip stabilizers/glut med, dynamic balance deficits and fall risk.  He would benefit from skilled PT for these deficits as well as a vestibular evaluation to assess his dizziness which contributes to his fall risk.     OBJECTIVE IMPAIRMENTS: decreased activity tolerance, decreased ROM, decreased strength, increased muscle spasms, impaired flexibility, impaired UE functional use, postural dysfunction, and pain.   ACTIVITY LIMITATIONS: bathing, dressing, reach over head, and hygiene/grooming  PARTICIPATION LIMITATIONS:  anything with OH activity   PERSONAL FACTORS: Age, Fitness, Time since onset of injury/illness/exacerbation, and 3+ comorbidities: DM, CKD, CAD  are also affecting patient's functional outcome.   REHAB POTENTIAL: Good  CLINICAL DECISION MAKING: Evolving/moderate complexity  EVALUATION COMPLEXITY: Moderate   GOALS: Goals reviewed with patient? Yes  SHORT TERM GOALS: Target date: 11/09/2023   Patient will be independent with initial HEP.  Baseline: 10/26/23 Goal status: MET  2.  Decreased shoulder pain with ADLs by 25%  Baseline: reduced pain today (10/26/23) Goal status:MET 11/16/23   3.  Balance assessed for fall risk Baseline: issued sit to stand for HEP Goal status: MET  Target date  : 01/11/2024  4. Patient will report 50% improved confidence in overall balance Baseline:  Goal status: INITIAL  LONG TERM GOALS: Target date: 12/07/2023   Patient will be independent with advanced/ongoing HEP to improve outcomes and carryover.  Baseline:  Goal status: MET 11/30/23  2.  Patient will report 75% improvement in R shoulder pain to improve QOL.  Baseline: 85% (11/25/23) Goal status: MET  3.  Patient to improve R shoulder AROM to [facility] without pain provocation to allow for increased ease of ADLs.  Baseline:  Goal status: MET 11/30/23  4.  Patient will demonstrate improved UE strength to 4+/5.  Baseline:  Goal status: MET 11/30/23  5.   Patient will report 67 on FOTO to demonstrate improved functional ability.  Baseline:  Goal status: MET 12/03/23  6.  Patient will be able to reach behind his back to dress without difficulty.   Baseline:  Goal status: MET 12/03/23  Target Date: 02/08/2024  7. Patient to report no falls or near falls over the course of the episode  we are treating for balance and falls.   Baseline:  Goal status: INITIAL  8. Patient will be cleared of having vertigo symptoms Baseline:  Goal status: INITIAL  9. Patient will score 38 on Berg balance scale Baseline:  Goal status: INITIAL  10. Patient will improve on functional tests: (TUG and 5x sit to stand) by 1-2 seconds Baseline:  Goal status: INITIAL  PLAN:  PT FREQUENCY: 2x/week  PT DURATION: 8 weeks  PLANNED INTERVENTIONS: 97164- PT Re-evaluation, 97110-Therapeutic exercises, 97530- Therapeutic activity, [id number]- Neuromuscular re-education, 97535- Self Care, [id number]- Manual therapy, [id number]- Gait training, [id number]- Canalith repositioning, [id number]- Aquatic Therapy, 97014- Electrical stimulation (unattended), 97016- Vasopneumatic device, [id number]- Ultrasound, 02966- Ionotophoresis 4mg /ml Dexamethasone, Patient/Family education, Balance training, Stair training, Taping, Dry Needling, Joint mobilization, Spinal mobilization, Vestibular training, Visual/preceptual remediation/compensation, Cryotherapy, and Moist heat  PLAN FOR NEXT SESSION: Nustep, Balance and gait training, fall prevention and LE weakness.     Delon B. Oniel Meleski, PT 12/15/23 8:14 AM [facility] [address], [address] [place], [place] [address] Phone # [phone] Fax [phone]

## 2023-12-16 ENCOUNTER — Ambulatory Visit: Payer: Medicare Other

## 2023-12-16 DIAGNOSIS — R262 Difficulty in walking, not elsewhere classified: Secondary | ICD-10-CM | POA: Diagnosis not present

## 2023-12-16 DIAGNOSIS — R2681 Unsteadiness on feet: Secondary | ICD-10-CM | POA: Diagnosis not present

## 2023-12-16 DIAGNOSIS — M6281 Muscle weakness (generalized): Secondary | ICD-10-CM | POA: Diagnosis not present

## 2023-12-16 DIAGNOSIS — R252 Cramp and spasm: Secondary | ICD-10-CM | POA: Diagnosis not present

## 2023-12-16 DIAGNOSIS — R293 Abnormal posture: Secondary | ICD-10-CM

## 2023-12-16 DIAGNOSIS — R29898 Other symptoms and signs involving the musculoskeletal system: Secondary | ICD-10-CM | POA: Diagnosis not present

## 2023-12-16 NOTE — Therapy (Signed)
 OUTPATIENT PHYSICAL THERAPY BALANCE TREATMENT  Patient Name: Ian [name] MRN: [id number] DOB:May 08, 1941, 83 y.o., male Today's Date: 12/16/2023  END OF SESSION:  PT End of Session - 12/16/23 1110     Visit Number 10    Date for PT Re-Evaluation 01/31/24    Authorization Type MCR    Progress Note Due on Visit 20    PT Start Time 1100    PT Stop Time 1145    PT Time Calculation (min) 45 min    Activity Tolerance Patient tolerated treatment well    Behavior During Therapy [facility] for tasks assessed/performed                Past Medical History:  Diagnosis Date   Allergy    Apnea, sleep 07/28/07   NPSG [place] , AHI 79.9   Carotid artery stenosis    mild 04/2010, 05/2012   Coronary heart disease 2008   Cath 2008, 60-70% proximal diagonal 1 stenosis,  RCA 40% stenosis.  nonischemic Lexiscan 03/2011   Diabetes mellitus    type II   GERD (gastroesophageal reflux disease)    Hyperlipidemia    Hypertension    Hypospadias    Past Surgical History:  Procedure Laterality Date   EYE SURGERY     NASAL SINUS SURGERY     NISSEN FUNDOPLICATION     TONSILLECTOMY     VEIN LIGATION AND STRIPPING     Patient Active Problem List   Diagnosis Date Noted   Dizziness 09/25/2020   CKD (chronic kidney disease), stage II 09/25/2020   Type 2 diabetes mellitus with complication, without long-term current use of insulin (HCC) 09/21/2019   Coronary artery disease involving native coronary artery of native heart without angina pectoris 03/23/2017   Bilateral carotid artery stenosis 03/23/2017   Dyslipidemia 03/23/2017   IBS (irritable bowel syndrome) 12/12/2014   Erectile dysfunction 01/19/2014   Diabetic polyneuropathy (HCC) 11/20/2013   Shortness of breath 09/26/2012   Chronic insomnia 03/25/2012   PALPITATIONS 11/19/2010   URINARY INCONTINENCE, PASSIVE, CONTINUOUS LEAKAGE 11/19/2010   Disorder resulting from impaired renal function 11/13/2009   VARICOSE VEINS, LOWER EXTREMITIES  09/17/2009   BPH with urinary obstruction 09/17/2009   RESTLESS LEG SYNDROME 02/12/2009   HIP PAIN, RIGHT 02/12/2009   DIAPHRAGMATIC DISORDER 10/26/2008   Diabetes mellitus without complication (HCC) 01/08/2008   Hyperlipidemia 01/08/2008   Essential hypertension 01/08/2008   Coronary atherosclerosis 01/08/2008   Allergic rhinitis 01/08/2008   Obstructive sleep apnea 01/08/2008    PCP: Merna Huxley, NP   REFERRING PROVIDER: Merna Huxley, NP   REFERRING DIAG: Current: Balance and Falls risk Initial: M79.621 (ICD-10-CM) - Pain in right upper arm   THERAPY DIAG:  Unsteadiness on feet  Difficulty in walking, not elsewhere classified  Muscle weakness (generalized)  Abnormal posture  Rationale for Evaluation and Treatment: Rehabilitation  ONSET DATE: June 2024  SUBJECTIVE:  SUBJECTIVE STATEMENT: Patient arrives with no c/o pain or issues.             Hand dominance: Right  PERTINENT HISTORY: CAD, DM, HTN, kidney issues  PAIN:  12/16/23 Are you having pain? Yes: NPRS scale: 0/10 Pain location: R deltoid area, R proximal forearm, R UT area Pain description: sharp Aggravating factors: IR, reaching OH, exercises Relieving factors: rest  PRECAUTIONS: None  RED FLAGS: None   WEIGHT BEARING RESTRICTIONS: No  FALLS:  Has patient fallen in last 6 months? No  LIVING ENVIRONMENT: Lives with: lives with their spouse Lives in: House/apartment Stairs: Yes: External: 1 steps; none Has following equipment at home: None  OCCUPATION: retired  PLOF: Independent  PATIENT GOALS: freedom of motion without pain  NEXT MD VISIT:   OBJECTIVE:  Note: Objective measures were completed at Evaluation unless otherwise noted.  DIAGNOSTIC FINDINGS:  IMPRESSION: 1. Mild acromioclavicular  degenerative change. 2. Mild subcortical cystic change in the lateral humeral head, nonspecific, but can be seen with rotator cuff pathology.  PATIENT SURVEYS:  Initial: FOTO 56 (goal 65)  12/03/23: goal was 65, score today was 77  Initial eval: 5x sit to stand: 16.61 seconds with min UE support  TUG deferred  12/03/23: 5x sit to stand: 9.67 seconds with min UE support  TUG: 9.44 sec  COGNITION: Overall cognitive status: Within functional limits for tasks assessed     SENSATION: WFL  POSTURE: Rounded shoulders, forward head  UPPER EXTREMITY ROM: * marked pain  A/P ROM Right eval Right 12/03/23 Left eval Left 12/03/23  Shoulder flexion 115/142* WFL 132 A/ROM [facility]  Shoulder extension      Shoulder abduction 108/120* WFL 108 A/ROM [facility]  Shoulder adduction      Shoulder internal rotation 56/70* WFL    Shoulder external rotation 54/55* WFL    Elbow flexion      Elbow extension      Wrist flexion      Wrist extension      Wrist ulnar deviation      Wrist radial deviation      Wrist pronation      Wrist supination      (Blank rows = not tested)  UPPER EXTREMITY MMT:  MMT Right eval Right 12/03/23 Left eval  Shoulder flexion 4- 4+   Shoulder extension 5 5   Shoulder abduction 4- 4+   Shoulder adduction     Shoulder internal rotation 4+ 5   Shoulder external rotation 4* 4+   Middle trapezius     Lower trapezius     Elbow flexion 4 4+   Elbow extension 5 5   Wrist flexion     Wrist extension 5 5   Wrist ulnar deviation     Wrist radial deviation     Wrist pronation     Wrist supination     Grip strength (lbs)     (Blank rows = not tested)  SHOULDER SPECIAL TESTS: Impingement tests: Neer impingement test: positive  and Hawkins/Kennedy impingement test: positive  Rotator cuff assessment: Empty can test: negative and Full can test: positive   JOINT MOBILITY TESTING:  Pain with AP mob, relief with PA  PALPATION:  Marked in SITS tendons, IS muscle, LH  biceps tendon   BERG BALANCE:   Modified CTSIB: floor eo: normal no sway         Floor ec: normal no sway  Balance pad : eo needs help getting up then full 20 sec        Balance pad: ec under 5 sec  TODAY'S TREATMENT:                                                                                                                                         DATE:  12/16/23 Nustep x 5 min , level 3 (PT present to discuss status) Lateral band walks with yellow loop x 5 laps at barre Marching on balance pad x 20 Squats on balance pad x 20 Lunges to balance pad fwd x 20 then lateral x 20 on each LE (verbal cues for proper foot clearance) Leg Press 60 lbs 2 x 20 (Patient kept going after first 20 I really like this one) - could probably do more resistance Hip matrix 2 x 10 25# bilateral : extension and abduction Cone touches at counter: heavy verbal and visual cues for correct ankle, knee, hip and core strategies 3 x 10 each LE Lunge to BOSU fwd x 10 each LE, then lateral x 10 each LE Step tap ups x 20 fwd then lateral x 10 each LE Step over and back (hurdle) laterals x 10 then fwd/back x 10 at barre (patient needed UE support on fwd/back but not on lateral stepping)  12/14/23 Assessment for balance and falls new diagnosis Hip matrix 2 x 10 25# bilateral : extension and abduction Education on various reasons for dizziness and how to manage this at home including monitoring BP, drinking water, and slow transitions Cone touches at counter: heavy verbal and visual cues for correct ankle, knee, hip and core strategies 3 x 10 each LE Sit to stand 2 x 10 Educated on importance of posture and step length  12/03/23 DC assessment for right shoulder completed Reviewed some strategies for balance and fall prevention for home and issued handouts Discussed his concerns for fall risk and feeling unsteady, contacted referring MD to discuss possible referral for gait, balance and LE  strength MD replied immediately and will send over new referral - will recertify to evaluate for new referral   PATIENT EDUCATION: Education details: PT eval findings, anticipated POC, initial HEP, and postural awareness  Person educated: Patient Education method: Explanation, Demonstration, and Handouts Education comprehension: verbalized understanding, returned demonstration, and verbal cues required  HOME EXERCISE PROGRAM: Access Code: [email or web address] URL: [email or web address] Date: 10/26/2023 Prepared by: Burnard  Exercises - Seated Shoulder Flexion AAROM with Dowel  - 2 x daily - 7 x weekly - 1-2 sets - 10 reps - Seated Shoulder External Rotation AAROM with Cane and Hand in Neutral  - 2 x daily - 7 x weekly - 1-2 sets - 10 reps - Seated Shoulder Abduction Towel Slide at Table Top (Mirrored)  - 2 x daily - 7 x weekly - 1-2 sets - 10 reps - Seated Scapular Retraction  -  1 x daily - 7 x weekly - 1 sets - 10 reps - 2-3 sec hold - Supine Shoulder Flexion Extension AAROM with Dowel  - 1 x daily - 7 x weekly - 1 sets - 20 reps - Supine Shoulder Abduction AAROM with Dowel  - 1 x daily - 7 x weekly - 1 sets - 20 reps - Supine Shoulder External Rotation in 45 Degrees Abduction AAROM with Dowel  - 1 x daily - 7 x weekly - 1 sets - 20 reps - Supine Shoulder Flexion Extension Full Range AROM  - 1 x daily - 7 x weekly - 2 sets - 10 reps - Single Arm Serratus Punches in Supine with Dumbbell  - 1 x daily - 7 x weekly - 2 sets - 10 reps - Sidelying Shoulder External Rotation  - 1 x daily - 7 x weekly - 2 sets - 10 reps - Standing Row with Anchored Resistance  - 1 x daily - 7 x weekly - 2 sets - 10 reps - Shoulder Extension with Resistance  - 1 x daily - 7 x weekly - 2 sets - 10 reps  ASSESSMENT:  CLINICAL IMPRESSION: Cederic demonstrates improved balance and proprioception in that he needed very little UE support on several tasks.  We added some higher level tasks which he tolerated fairly  well.  He had several losses of balance but good recovery responses.    He would benefit from continued skilled PT for balance and gait training along with LE strengthening and hip stability.    OBJECTIVE IMPAIRMENTS: decreased activity tolerance, decreased ROM, decreased strength, increased muscle spasms, impaired flexibility, impaired UE functional use, postural dysfunction, and pain.   ACTIVITY LIMITATIONS: bathing, dressing, reach over head, and hygiene/grooming  PARTICIPATION LIMITATIONS:  anything with OH activity   PERSONAL FACTORS: Age, Fitness, Time since onset of injury/illness/exacerbation, and 3+ comorbidities: DM, CKD, CAD  are also affecting patient's functional outcome.   REHAB POTENTIAL: Good  CLINICAL DECISION MAKING: Evolving/moderate complexity  EVALUATION COMPLEXITY: Moderate   GOALS: Goals reviewed with patient? Yes  SHORT TERM GOALS: Target date: 11/09/2023   Patient will be independent with initial HEP.  Baseline: 10/26/23 Goal status: MET  2.  Decreased shoulder pain with ADLs by 25%  Baseline: reduced pain today (10/26/23) Goal status:MET 11/16/23   3.  Balance assessed for fall risk Baseline: issued sit to stand for HEP Goal status: MET  Target date  : 01/11/2024  4. Patient will report 50% improved confidence in overall balance Baseline:  Goal status: INITIAL  LONG TERM GOALS: Target date: 12/07/2023   Patient will be independent with advanced/ongoing HEP to improve outcomes and carryover.  Baseline:  Goal status: MET 11/30/23  2.  Patient will report 75% improvement in R shoulder pain to improve QOL.  Baseline: 85% (11/25/23) Goal status: MET  3.  Patient to improve R shoulder AROM to [facility] without pain provocation to allow for increased ease of ADLs.  Baseline:  Goal status: MET 11/30/23  4.  Patient will demonstrate improved UE strength to 4+/5.  Baseline:  Goal status: MET 11/30/23  5.  Patient will report 31 on FOTO to demonstrate  improved functional ability.  Baseline:  Goal status: MET 12/03/23  6.  Patient will be able to reach behind his back to dress without difficulty.   Baseline:  Goal status: MET 12/03/23  Target Date: 02/08/2024  7. Patient to report no falls or near falls over the course of the episode  we are treating for balance and falls.   Baseline:  Goal status: INITIAL  8. Patient will be cleared of having vertigo symptoms Baseline:  Goal status: INITIAL  9. Patient will score 38 on Berg balance scale Baseline:  Goal status: INITIAL  10. Patient will improve on functional tests: (TUG and 5x sit to stand) by 1-2 seconds Baseline:  Goal status: INITIAL  PLAN:  PT FREQUENCY: 2x/week  PT DURATION: 8 weeks  PLANNED INTERVENTIONS: 97164- PT Re-evaluation, 97110-Therapeutic exercises, 97530- Therapeutic activity, [id number]- Neuromuscular re-education, 97535- Self Care, [id number]- Manual therapy, [id number]- Gait training, [phone]- Canalith repositioning, [id number]- Aquatic Therapy, 97014- Electrical stimulation (unattended), 97016- Vasopneumatic device, [id number]- Ultrasound, [id number]- Ionotophoresis 4mg /ml Dexamethasone, Patient/Family education, Balance training, Stair training, Taping, Dry Needling, Joint mobilization, Spinal mobilization, Vestibular training, Visual/preceptual remediation/compensation, Cryotherapy, and Moist heat  PLAN FOR NEXT SESSION: Nustep, Balance and gait training, fall prevention and LE weakness.     Delon B. Lillyan Hitson, PT 12/16/23 10:00 PM [facility] [address] [place], [place] [address] Phone # [phone] Fax [phone]

## 2023-12-21 ENCOUNTER — Ambulatory Visit: Payer: Medicare Other | Admitting: Rehabilitative and Restorative Service Providers"

## 2023-12-21 ENCOUNTER — Encounter: Payer: Self-pay | Admitting: Rehabilitative and Restorative Service Providers"

## 2023-12-21 DIAGNOSIS — M6281 Muscle weakness (generalized): Secondary | ICD-10-CM | POA: Diagnosis not present

## 2023-12-21 DIAGNOSIS — R262 Difficulty in walking, not elsewhere classified: Secondary | ICD-10-CM

## 2023-12-21 DIAGNOSIS — R293 Abnormal posture: Secondary | ICD-10-CM | POA: Diagnosis not present

## 2023-12-21 DIAGNOSIS — R29898 Other symptoms and signs involving the musculoskeletal system: Secondary | ICD-10-CM | POA: Diagnosis not present

## 2023-12-21 DIAGNOSIS — R2681 Unsteadiness on feet: Secondary | ICD-10-CM

## 2023-12-21 DIAGNOSIS — R252 Cramp and spasm: Secondary | ICD-10-CM | POA: Diagnosis not present

## 2023-12-21 NOTE — Therapy (Signed)
OUTPATIENT PHYSICAL THERAPY BALANCE TREATMENT  Patient Name: Ian [name] MRN: [id number] DOB:August 12, 1941, 83 y.o., male Today's Date: 12/21/2023  END OF SESSION:  PT End of Session - 12/21/23 1020     Visit Number 11    Date for PT Re-Evaluation 01/31/24    Authorization Type MCR    Progress Note Due on Visit 20    PT Start Time 1015    PT Stop Time 1100    PT Time Calculation (min) 45 min    Activity Tolerance Patient tolerated treatment well    Behavior During Therapy [facility] for tasks assessed/performed                Past Medical History:  Diagnosis Date   Allergy    Apnea, sleep 07/28/07   NPSG [place], AHI 79.9   Carotid artery stenosis    mild 04/2010, 05/2012   Coronary heart disease 2008   Cath 2008, 60-70% proximal diagonal 1 stenosis,  RCA 40% stenosis.  nonischemic Lexiscan 03/2011   Diabetes mellitus    type II   GERD (gastroesophageal reflux disease)    Hyperlipidemia    Hypertension    Hypospadias    Past Surgical History:  Procedure Laterality Date   EYE SURGERY     NASAL SINUS SURGERY     NISSEN FUNDOPLICATION     TONSILLECTOMY     VEIN LIGATION AND STRIPPING     Patient Active Problem List   Diagnosis Date Noted   Dizziness 09/25/2020   CKD (chronic kidney disease), stage II 09/25/2020   Type 2 diabetes mellitus with complication, without long-term current use of insulin (HCC) 09/21/2019   Coronary artery disease involving native coronary artery of native heart without angina pectoris 03/23/2017   Bilateral carotid artery stenosis 03/23/2017   Dyslipidemia 03/23/2017   IBS (irritable bowel syndrome) 12/12/2014   Erectile dysfunction 01/19/2014   Diabetic polyneuropathy (HCC) 11/20/2013   Shortness of breath 09/26/2012   Chronic insomnia 03/25/2012   PALPITATIONS 11/19/2010   URINARY INCONTINENCE, PASSIVE, CONTINUOUS LEAKAGE 11/19/2010   Disorder resulting from impaired renal function 11/13/2009   VARICOSE VEINS, LOWER EXTREMITIES  09/17/2009   BPH with urinary obstruction 09/17/2009   RESTLESS LEG SYNDROME 02/12/2009   HIP PAIN, RIGHT 02/12/2009   DIAPHRAGMATIC DISORDER 10/26/2008   Diabetes mellitus without complication (HCC) 01/08/2008   Hyperlipidemia 01/08/2008   Essential hypertension 01/08/2008   Coronary atherosclerosis 01/08/2008   Allergic rhinitis 01/08/2008   Obstructive sleep apnea 01/08/2008    PCP: Shirline Frees, NP   REFERRING PROVIDER: Shirline Frees, NP   REFERRING DIAG: Current: Balance and Falls risk Initial: M79.621 (ICD-10-CM) - Pain in right upper arm   THERAPY DIAG:  Unsteadiness on feet  Difficulty in walking, not elsewhere classified  Muscle weakness (generalized)  Rationale for Evaluation and Treatment: Rehabilitation  ONSET DATE: June 2024  SUBJECTIVE:  SUBJECTIVE STATEMENT: Patient states that he has dizziness intermittently around the day.  States that that he mainly gets dizzy when he stands up and as he's walking around a lot.      Hand dominance: Right  PERTINENT HISTORY: CAD, DM, HTN, kidney issues  PAIN:  Are you having pain? Yes: NPRS scale: 1-2/10 Pain location: knees and shoulders Pain description: sharp Aggravating factors: IR, reaching OH, exercises Relieving factors: rest  PRECAUTIONS: None  RED FLAGS: None   WEIGHT BEARING RESTRICTIONS: No  FALLS:  Has patient fallen in last 6 months? No  LIVING ENVIRONMENT: Lives with: lives with their spouse Lives in: House/apartment Stairs: Yes: External: 1 steps; none Has following equipment at home: None  OCCUPATION: retired  PLOF: Independent  PATIENT GOALS: freedom of motion without pain  NEXT MD VISIT:   OBJECTIVE:  Note: Objective measures were completed at Evaluation unless otherwise noted.  DIAGNOSTIC  FINDINGS:  IMPRESSION: 1. Mild acromioclavicular degenerative change. 2. Mild subcortical cystic change in the lateral humeral head, nonspecific, but can be seen with rotator cuff pathology.  PATIENT SURVEYS:  Initial: FOTO 56 (goal 65)  12/03/23: goal was 65, score today was 77  Initial eval: 5x sit to stand: 16.61 seconds with min UE support  TUG deferred  12/03/23: 5x sit to stand: 9.67 seconds with min UE support  TUG: 9.44 sec  COGNITION: Overall cognitive status: Within functional limits for tasks assessed     SENSATION: WFL  POSTURE: Rounded shoulders, forward head  UPPER EXTREMITY ROM: * marked pain  A/P ROM Right eval Right 12/03/23 Left eval Left 12/03/23  Shoulder flexion 115/142* WFL 132 A/ROM [facility]  Shoulder extension      Shoulder abduction 108/120* WFL 108 A/ROM [facility]  Shoulder adduction      Shoulder internal rotation 56/70* WFL    Shoulder external rotation 54/55* WFL    Elbow flexion      Elbow extension      Wrist flexion      Wrist extension      Wrist ulnar deviation      Wrist radial deviation      Wrist pronation      Wrist supination      (Blank rows = not tested)  UPPER EXTREMITY MMT:  MMT Right eval Right 12/03/23 Left eval  Shoulder flexion 4- 4+   Shoulder extension 5 5   Shoulder abduction 4- 4+   Shoulder adduction     Shoulder internal rotation 4+ 5   Shoulder external rotation 4* 4+   Middle trapezius     Lower trapezius     Elbow flexion 4 4+   Elbow extension 5 5   Wrist flexion     Wrist extension 5 5   Wrist ulnar deviation     Wrist radial deviation     Wrist pronation     Wrist supination     Grip strength (lbs)     (Blank rows = not tested)  SHOULDER SPECIAL TESTS: Impingement tests: Neer impingement test: positive  and Hawkins/Kennedy impingement test: positive  Rotator cuff assessment: Empty can test: negative and Full can test: positive   JOINT MOBILITY TESTING:  Pain with AP mob, relief with  PA  PALPATION:  Marked in SITS tendons, IS muscle, LH biceps tendon   VESTIBULAR ASSESSMENT:  Orthostatic Hypotension: 12/21/2023: BP lying supine for 5 min:  128/55, HR 55 bpm BP After standing for 1 min: 137/63, HR 60 BP after standing for 3 min:  155/67, HR 65  12/21/2023:   Modified CTSIB: Condition 1- 30 sec, Condition 2- 30 sec, Condition 3- 30 sec, Condition 4- 30 sec.  Total- 120/120.  Some sway noted on condition 4 (eyes closed on foam pad). Seated ocular tracking:  normal/intact Saccades:  intact Head thrust:  slight nystagmus noted to each side    TODAY'S TREATMENT:                                                                                                                                         DATE:  12/21/2023 Nustep level 4 x5 min with PT present to discuss status Vestibular Evaluation (see above) Seated VOR 1x1 for vertical and horizontal x30 sec each Standing on foam performing horizontal, then vertical head movements with CGA.  2x20 sec each (pt with most difficulty with horizontal movements) Seated pencil push-ups 2x10 Ambulation down hallway with head nods and head rotations x10 ft each Issued HEP (see below)   12/16/23 Nustep x 5 min , level 3 (PT present to discuss status) Lateral band walks with yellow loop x 5 laps at barre Marching on balance pad x 20 Squats on balance pad x 20 Lunges to balance pad fwd x 20 then lateral x 20 on each LE (verbal cues for proper foot clearance) Leg Press 60 lbs 2 x 20 (Patient kept going after first 20 "I really like this one") - could probably do more resistance Hip matrix 2 x 10 25# bilateral : extension and abduction Cone touches at counter: heavy verbal and visual cues for correct ankle, knee, hip and core strategies 3 x 10 each LE Lunge to BOSU fwd x 10 each LE, then lateral x 10 each LE Step tap ups x 20 fwd then lateral x 10 each LE Step over and back (hurdle) laterals x 10 then fwd/back x 10 at barre (patient  needed UE support on fwd/back but not on lateral stepping)  12/14/23 Assessment for balance and falls new diagnosis Hip matrix 2 x 10 25# bilateral : extension and abduction Education on various reasons for dizziness and how to manage this at home including monitoring BP, drinking water, and slow transitions Cone touches at counter: heavy verbal and visual cues for correct ankle, knee, hip and core strategies 3 x 10 each LE Sit to stand 2 x 10 Educated on importance of posture and step length   PATIENT EDUCATION: Education details: PT eval findings, anticipated POC, initial HEP, and postural awareness  Person educated: Patient Education method: Explanation, Demonstration, and Handouts Education comprehension: verbalized understanding, returned demonstration, and verbal cues required  HOME EXERCISE PROGRAM: Access Code: [email or web address] URL: [email or web address] Date: 10/26/2023 Prepared by: Tresa Endo  Exercises - Seated Shoulder Flexion AAROM with Dowel  - 2 x daily - 7 x weekly - 1-2 sets - 10 reps - Seated Shoulder External Rotation AAROM with Cane and Hand in Neutral  -  2 x daily - 7 x weekly - 1-2 sets - 10 reps - Seated Shoulder Abduction Towel Slide at Table Top (Mirrored)  - 2 x daily - 7 x weekly - 1-2 sets - 10 reps - Seated Scapular Retraction  - 1 x daily - 7 x weekly - 1 sets - 10 reps - 2-3 sec hold - Supine Shoulder Flexion Extension AAROM with Dowel  - 1 x daily - 7 x weekly - 1 sets - 20 reps - Supine Shoulder Abduction AAROM with Dowel  - 1 x daily - 7 x weekly - 1 sets - 20 reps - Supine Shoulder External Rotation in 45 Degrees Abduction AAROM with Dowel  - 1 x daily - 7 x weekly - 1 sets - 20 reps - Supine Shoulder Flexion Extension Full Range AROM  - 1 x daily - 7 x weekly - 2 sets - 10 reps - Single Arm Serratus Punches in Supine with Dumbbell  - 1 x daily - 7 x weekly - 2 sets - 10 reps - Sidelying Shoulder External Rotation  - 1 x daily - 7 x weekly - 2 sets  - 10 reps - Standing Row with Anchored Resistance  - 1 x daily - 7 x weekly - 2 sets - 10 reps - Shoulder Extension with Resistance  - 1 x daily - 7 x weekly - 2 sets - 10 reps  Access Code: [email or web address] URL: [email or web address] Date: 12/21/2023 Prepared by: Clydie Braun Wynn Kernes  Added Vestibular Exercises - Pencil Pushups  - 1 x daily - 7 x weekly - 2 sets - 10 reps - Seated Gaze Stabilization with Head Rotation  - 1 x daily - 7 x weekly - 1-2 sets - 30 sec hold - Seated Gaze Stabilization with Head Nod  - 1 x daily - 7 x weekly - 1-2 sets - 30-60 sec hold - Walking with Head Nod  - 1 x daily - 7 x weekly - 2 sets - 10 reps - Walking with Head Rotation  - 1 x daily - 7 x weekly - 2 sets - 10 reps  ASSESSMENT:  CLINICAL IMPRESSION: Mr Yurkovich presents to skilled PT reporting that he is having dizziness primarily when he goes to standing or with walking around, reports it is intermittent in nature.  Performed vestibular assessment and did not note any Central Vestibular signs today.  Patient does appear to have a hypofunctioning vestibular system.  Provided patient with exercises to address his deficits.  Patient with most difficulty with horizontal head motions vs vertical head motions.  Patient with good participation and provided with updated HEP for new vestibular exercises.  Patient continues to require skilled PT to progress towards goal related activities and improved functional balance.  OBJECTIVE IMPAIRMENTS: decreased activity tolerance, decreased ROM, decreased strength, increased muscle spasms, impaired flexibility, impaired UE functional use, postural dysfunction, and pain.   ACTIVITY LIMITATIONS: bathing, dressing, reach over head, and hygiene/grooming  PARTICIPATION LIMITATIONS:  anything with OH activity   PERSONAL FACTORS: Age, Fitness, Time since onset of injury/illness/exacerbation, and 3+ comorbidities: DM, CKD, CAD  are also affecting patient's functional outcome.    REHAB POTENTIAL: Good  CLINICAL DECISION MAKING: Evolving/moderate complexity  EVALUATION COMPLEXITY: Moderate   GOALS: Goals reviewed with patient? Yes  SHORT TERM GOALS: Target date: 11/09/2023   Patient will be independent with initial HEP.  Baseline: 10/26/23 Goal status: MET  2.  Decreased shoulder pain with ADLs by 25%  Baseline: reduced pain today (10/26/23) Goal  status:MET 11/16/23   3.  Balance assessed for fall risk Baseline: issued sit to stand for HEP Goal status: MET  Target date  : 01/11/2024  4. Patient will report 50% improved confidence in overall balance Baseline:  Goal status: Ongoing  LONG TERM GOALS: Target date: 12/07/2023   Patient will be independent with advanced/ongoing HEP to improve outcomes and carryover.  Baseline:  Goal status: MET 11/30/23  2.  Patient will report 75% improvement in R shoulder pain to improve QOL.  Baseline: 85% (11/25/23) Goal status: MET  3.  Patient to improve R shoulder AROM to [facility] without pain provocation to allow for increased ease of ADLs.  Baseline:  Goal status: MET 11/30/23  4.  Patient will demonstrate improved UE strength to 4+/5.  Baseline:  Goal status: MET 11/30/23  5.  Patient will report 53 on FOTO to demonstrate improved functional ability.  Baseline:  Goal status: MET 12/03/23  6.  Patient will be able to reach behind his back to dress without difficulty.   Baseline:  Goal status: MET 12/03/23  Target Date: 02/08/2024  7. Patient to report no falls or near falls over the course of the episode we are treating for balance and falls.   Baseline:  Goal status: INITIAL  8. Patient will be cleared of having vertigo symptoms Baseline:  Goal status: INITIAL  9. Patient will score 38 on Berg balance scale Baseline:  Goal status: INITIAL  10. Patient will improve on functional tests: (TUG and 5x sit to stand) by 1-2 seconds Baseline:  Goal status: INITIAL  PLAN:  PT FREQUENCY: 2x/week  PT  DURATION: 8 weeks  PLANNED INTERVENTIONS: 97164- PT Re-evaluation, 97110-Therapeutic exercises, 97530- Therapeutic activity, 97112- Neuromuscular re-education, 97535- Self Care, 16109- Manual therapy, [phone]- Gait training, [phone]- Canalith repositioning, [id number]- Aquatic Therapy, 97014- Electrical stimulation (unattended), 97016- Vasopneumatic device, [id number]- Ultrasound, 91478- Ionotophoresis 4mg /ml Dexamethasone, Patient/Family education, Balance training, Stair training, Taping, Dry Needling, Joint mobilization, Spinal mobilization, Vestibular training, Visual/preceptual remediation/compensation, Cryotherapy, and Moist heat  PLAN FOR NEXT SESSION: Nustep, Balance and gait training, fall prevention and LE weakness, vestibular rehab   Reather Laurence, PT, DPT 12/21/23, 11:22 AM  [facility] [address] [place], [place] [address] Phone # [phone] Fax [phone]

## 2023-12-22 ENCOUNTER — Encounter: Payer: Self-pay | Admitting: Adult Health

## 2023-12-22 ENCOUNTER — Ambulatory Visit (INDEPENDENT_AMBULATORY_CARE_PROVIDER_SITE_OTHER): Payer: Medicare Other | Admitting: Adult Health

## 2023-12-22 VITALS — BP 120/60 | HR 76 | Temp 97.6°F | Ht 67.0 in | Wt 173.0 lb

## 2023-12-22 DIAGNOSIS — Z7984 Long term (current) use of oral hypoglycemic drugs: Secondary | ICD-10-CM

## 2023-12-22 DIAGNOSIS — I10 Essential (primary) hypertension: Secondary | ICD-10-CM

## 2023-12-22 DIAGNOSIS — E119 Type 2 diabetes mellitus without complications: Secondary | ICD-10-CM | POA: Diagnosis not present

## 2023-12-22 NOTE — Progress Notes (Signed)
Subjective:    Patient ID: Ian [name], male    DOB: [birth year], 83 y.o.   MRN: [id number]  Diabetes   83 year old male who  has a past medical history of Allergy, Apnea, sleep (07/28/07), Carotid artery stenosis, Coronary heart disease (2008), Diabetes mellitus, GERD (gastroesophageal reflux disease), Hyperlipidemia, Hypertension, and Hypospadias.  He presents to the office today for follow up regarding DM and HTN   DM Type 2 with CKD stage 3a - Managed with Farxiga 10 mg daily and Glipizide 2.5 mg XR daily. He does check his BS periodically with readings in the [id number] range. He has known diabetic neuropathy which he takes gabapentin 300 mg - 600 mg at bedtime. He does try and eat a healthy diet and stay active  Lab Results  Component Value Date   HGBA1C 7.4 (H) 08/26/2022   HTN- managed with Benicar 20 mg daily BP Readings from Last 3 Encounters:  12/22/23 120/60  11/24/23 110/70  11/19/23 126/62     Review of Systems See HPI   Past Medical History:  Diagnosis Date   Allergy    Apnea, sleep 07/28/07   NPSG [place], AHI 79.9   Carotid artery stenosis    mild 04/2010, 05/2012   Coronary heart disease 2008   Cath 2008, 60-70% proximal diagonal 1 stenosis,  RCA 40% stenosis.  nonischemic Lexiscan 03/2011   Diabetes mellitus    type II   GERD (gastroesophageal reflux disease)    Hyperlipidemia    Hypertension    Hypospadias     Social History   Socioeconomic History   Marital status: Married    Spouse name: Not on file   Number of children: 3   Years of education: Not on file   Highest education level: Master's degree (e.g., MA, MS, MEng, MEd, MSW, MBA)  Occupational History   Occupation: Retired Economist: RETIRED  Tobacco Use   Smoking status: Never   Smokeless tobacco: Never  Substance and Sexual Activity   Alcohol use: No    Alcohol/week: 0.0 standard drinks of alcohol   Drug use: No   Sexual activity: Not on file  Other Topics Concern    Not on file  Social History Narrative   Married.  Three children from first marriage.  First wife died of ovarian cancer.     Retired, did work for [employer] for 44 years.    Social Drivers of Corporate investment banker Strain: Low Risk  (11/18/2023)   Overall Financial Resource Strain (CARDIA)    Difficulty of Paying Living Expenses: Not hard at all  Food Insecurity: No Food Insecurity (11/18/2023)   Hunger Vital Sign    Worried About Running Out of Food in the Last Year: Never true    Ran Out of Food in the Last Year: Never true  Transportation Needs: No Transportation Needs (11/18/2023)   PRAPARE - Administrator, Civil Service (Medical): No    Lack of Transportation (Non-Medical): No  Physical Activity: Insufficiently Active (11/18/2023)   Exercise Vital Sign    Days of Exercise per Week: 3 days    Minutes of Exercise per Session: 40 min  Stress: No Stress Concern Present (11/18/2023)   [employer] of Occupational Health - Occupational Stress Questionnaire    Feeling of Stress : Only a little  Social Connections: Socially Integrated (11/18/2023)   Social Connection and Isolation Panel [NHANES]    Frequency of Communication with Friends  and Family: More than three times a week    Frequency of Social Gatherings with Friends and Family: Once a week    Attends Religious Services: More than 4 times per year    Active Member of [employer] or Organizations: Yes    Attends Engineer, structural: More than 4 times per year    Marital Status: Married  Catering manager Violence: Unknown (12/24/2022)   Received from [employer], [facility]   HITS    Physically Hurt: Not on file    Insult or Talk Down To: Not on file    Threaten Physical Harm: Not on file    Scream or Curse: Not on file    Past Surgical History:  Procedure Laterality Date   EYE SURGERY     NASAL SINUS SURGERY     NISSEN FUNDOPLICATION     TONSILLECTOMY     VEIN LIGATION AND STRIPPING       Family History  Problem Relation Age of Onset   Coronary artery disease Father 82       CABG   Dementia Mother     Allergies  Allergen Reactions   Iohexol      Desc: pt. states severe reaction, dr. told patient never to have the dye again     Current Outpatient Medications on File Prior to Visit  Medication Sig Dispense Refill   aspirin 81 MG tablet Take 1 tablet (81 mg total) by mouth daily. 90 tablet 3   Cholecalciferol (VITAMIN D3 PO) Take 10,000 Units by mouth once a week.     clindamycin (CLEOCIN T) 1 % external solution SMARTSIG:Topical Twice a Week PRN     clotrimazole-betamethasone (LOTRISONE) cream Apply 1 Application topically daily. 30 g 0   Coenzyme Q10 (CO Q10) 100 MG CAPS Take 1 tablet by mouth daily.     econazole nitrate 1 % cream Apply topically daily. 15 g 2   FARXIGA 10 MG TABS tablet Take 10 mg by mouth daily.     fexofenadine (ALLEGRA) 180 MG tablet Take 1 tablet (180 mg total) by mouth daily. 90 tablet 3   fluticasone (FLONASE) 50 MCG/ACT nasal spray USE 2 SPRAYS IN EACH NOSTRIL DAILY 48 g 3   gabapentin (NEURONTIN) 300 MG capsule TAKE 1 TO 2 CAPSULES IN THE EVENING 180 capsule 3   glipiZIDE (GLUCOTROL XL) 2.5 MG 24 hr tablet TAKE 1 TABLET DAILY WITH BREAKFAST 90 tablet 3   GLUCOSAMINE-CHONDROITIN PO Take by mouth.     hydrocortisone 2.5 % cream Apply topically 2 (two) times daily. 453.6 g 0   Lancets (ONETOUCH ULTRASOFT) lancets USE TO TEST BLOOD GLUCOSE TWICE DAILY 200 each 3   nitroGLYCERIN (NITROSTAT) 0.4 MG SL tablet DISSOLVE 1 TABLET UNDER THE TONGUE EVERY 5 MINUTES AS NEEDED FOR CHEST PAIN 75 tablet 14   olmesartan (BENICAR) 20 MG tablet TAKE 1 TABLET DAILY 90 tablet 3   Omega-3 Fatty Acids (FISH OIL ULTRA) 1400 MG CAPS Take by mouth.     SYSTANE ULTRA 0.4-0.3 % SOLN      tamsulosin (FLOMAX) 0.4 MG CAPS capsule Take 1 capsule (0.4 mg total) by mouth daily. 90 capsule 3   traMADol (ULTRAM) 50 MG tablet TAKE 1 TABLET EVERY 8 HOURS AS NEEDED 90  tablet 2   zolpidem (AMBIEN) 5 MG tablet TAKE 1 TABLET AT BEDTIME AS NEEDED FOR SLEEP 90 tablet 0   rosuvastatin (CRESTOR) 40 MG tablet Take 1 tablet (40 mg total) by mouth daily. 90 tablet  3   No current facility-administered medications on file prior to visit.    BP 120/60   Pulse 76   Temp 97.6 F (36.4 C) (Oral)   Ht 5\' 7"  (1.702 m)   Wt 173 lb (78.5 kg)   SpO2 97%   BMI 27.10 kg/m       Objective:   Physical Exam Vitals and nursing note reviewed.  Constitutional:      Appearance: Normal appearance.  Cardiovascular:     Rate and Rhythm: Normal rate and regular rhythm.     Pulses: Normal pulses.     Heart sounds: Normal heart sounds.  Pulmonary:     Effort: Pulmonary effort is normal.     Breath sounds: Normal breath sounds.  Musculoskeletal:        General: Normal range of motion.       Feet:  Feet:     Right foot:     Skin integrity: Callus present.     Toenail Condition: Right toenails are abnormally thick.     Left foot:     Skin integrity: Callus present.     Toenail Condition: Left toenails are abnormally thick.  Skin:    General: Skin is warm and dry.  Neurological:     General: No focal deficit present.     Mental Status: He is alert and oriented to person, place, and time.  Psychiatric:        Mood and Affect: Mood normal.        Behavior: Behavior normal.        Thought Content: Thought content normal.        Judgment: Judgment normal.           Assessment & Plan:  1. Diabetes mellitus without complication (HCC) (Primary) - Consider increasing Glipizide. Continue with Farxiga  - Hemoglobin A1c; Future - Microalbumin/Creatinine Ratio, Urine; Future - Comprehensive metabolic panel; Future - Comprehensive metabolic panel - Microalbumin/Creatinine Ratio, Urine - Hemoglobin A1c  2. Essential hypertension - well controlled. No change in medication  - Microalbumin/Creatinine Ratio, Urine; Future - Comprehensive metabolic panel; Future -  Comprehensive metabolic panel - Microalbumin/Creatinine Ratio, Urine   Shirline Frees, NP

## 2023-12-23 ENCOUNTER — Encounter: Payer: Self-pay | Admitting: Adult Health

## 2023-12-23 ENCOUNTER — Other Ambulatory Visit: Payer: Self-pay | Admitting: Adult Health

## 2023-12-23 ENCOUNTER — Ambulatory Visit: Payer: Medicare Other

## 2023-12-23 DIAGNOSIS — R293 Abnormal posture: Secondary | ICD-10-CM | POA: Diagnosis not present

## 2023-12-23 DIAGNOSIS — M6281 Muscle weakness (generalized): Secondary | ICD-10-CM

## 2023-12-23 DIAGNOSIS — R29898 Other symptoms and signs involving the musculoskeletal system: Secondary | ICD-10-CM | POA: Diagnosis not present

## 2023-12-23 DIAGNOSIS — R262 Difficulty in walking, not elsewhere classified: Secondary | ICD-10-CM | POA: Diagnosis not present

## 2023-12-23 DIAGNOSIS — R2681 Unsteadiness on feet: Secondary | ICD-10-CM

## 2023-12-23 DIAGNOSIS — R252 Cramp and spasm: Secondary | ICD-10-CM | POA: Diagnosis not present

## 2023-12-23 LAB — COMPREHENSIVE METABOLIC PANEL
ALT: 21 U/L (ref 0–53)
AST: 27 U/L (ref 0–37)
Albumin: 4.2 g/dL (ref 3.5–5.2)
Alkaline Phosphatase: 47 U/L (ref 39–117)
BUN: 27 mg/dL — ABNORMAL HIGH (ref 6–23)
CO2: 27 meq/L (ref 19–32)
Calcium: 9.4 mg/dL (ref 8.4–10.5)
Chloride: 103 meq/L (ref 96–112)
Creatinine, Ser: 1.31 mg/dL (ref 0.40–1.50)
GFR: 49.96 mL/min — ABNORMAL LOW (ref >60.00)
Glucose, Bld: 130 mg/dL — ABNORMAL HIGH (ref 70–99)
Potassium: 4 meq/L (ref 3.5–5.1)
Sodium: 139 meq/L (ref 135–145)
Total Bilirubin: 0.8 mg/dL (ref 0.2–1.2)
Total Protein: 7.2 g/dL (ref 6.0–8.3)

## 2023-12-23 LAB — MICROALBUMIN / CREATININE URINE RATIO
Creatinine,U: 42.4 mg/dL
Microalb Creat Ratio: 16.5 mg/g (ref 0.0–30.0)
Microalb, Ur: <0.7 mg/dL (ref 0.0–1.9)

## 2023-12-23 LAB — HEMOGLOBIN A1C: Hgb A1c MFr Bld: 6.7 % — ABNORMAL HIGH (ref 4.6–6.5)

## 2023-12-23 MED ORDER — AMOXICILLIN-POT CLAVULANATE 875-125 MG PO TABS: 1.0000 | ORAL_TABLET | Freq: Two times a day (BID) | ORAL | 0 refills | Status: AC

## 2023-12-23 NOTE — Telephone Encounter (Signed)
FYI

## 2023-12-23 NOTE — Therapy (Signed)
OUTPATIENT PHYSICAL THERAPY BALANCE TREATMENT  Patient Name: Ian [name] MRN: [id number] DOB:[birth year], 83 y.o., male Today's Date: 12/23/2023  END OF SESSION:  PT End of Session - 12/23/23 1101     Visit Number 12    Date for PT Re-Evaluation 01/31/24    Authorization Type MCR    Progress Note Due on Visit 20    PT Start Time 1058    PT Stop Time 1144    PT Time Calculation (min) 46 min    Activity Tolerance Patient tolerated treatment well    Behavior During Therapy [facility] for tasks assessed/performed                Past Medical History:  Diagnosis Date   Allergy    Apnea, sleep 07/28/07   NPSG [place], AHI 79.9   Carotid artery stenosis    mild 04/2010, 05/2012   Coronary heart disease 2008   Cath 2008, 60-70% proximal diagonal 1 stenosis,  RCA 40% stenosis.  nonischemic Lexiscan 03/2011   Diabetes mellitus    type II   GERD (gastroesophageal reflux disease)    Hyperlipidemia    Hypertension    Hypospadias    Past Surgical History:  Procedure Laterality Date   EYE SURGERY     NASAL SINUS SURGERY     NISSEN FUNDOPLICATION     TONSILLECTOMY     VEIN LIGATION AND STRIPPING     Patient Active Problem List   Diagnosis Date Noted   Dizziness 09/25/2020   CKD (chronic kidney disease), stage II 09/25/2020   Type 2 diabetes mellitus with complication, without long-term current use of insulin (HCC) 09/21/2019   Coronary artery disease involving native coronary artery of native heart without angina pectoris 03/23/2017   Bilateral carotid artery stenosis 03/23/2017   Dyslipidemia 03/23/2017   IBS (irritable bowel syndrome) 12/12/2014   Erectile dysfunction 01/19/2014   Diabetic polyneuropathy (HCC) 11/20/2013   Shortness of breath 09/26/2012   Chronic insomnia 03/25/2012   PALPITATIONS 11/19/2010   URINARY INCONTINENCE, PASSIVE, CONTINUOUS LEAKAGE 11/19/2010   Disorder resulting from impaired renal function 11/13/2009   VARICOSE VEINS, LOWER EXTREMITIES  09/17/2009   BPH with urinary obstruction 09/17/2009   RESTLESS LEG SYNDROME 02/12/2009   HIP PAIN, RIGHT 02/12/2009   DIAPHRAGMATIC DISORDER 10/26/2008   Diabetes mellitus without complication (HCC) 01/08/2008   Hyperlipidemia 01/08/2008   Essential hypertension 01/08/2008   Coronary atherosclerosis 01/08/2008   Allergic rhinitis 01/08/2008   Obstructive sleep apnea 01/08/2008    PCP: Shirline Frees, NP   REFERRING PROVIDER: Shirline Frees, NP   REFERRING DIAG: Current: Balance and Falls risk Initial: M79.621 (ICD-10-CM) - Pain in right upper arm   THERAPY DIAG:  Unsteadiness on feet  Difficulty in walking, not elsewhere classified  Muscle weakness (generalized)  Cramp and spasm  Abnormal posture  Rationale for Evaluation and Treatment: Rehabilitation  ONSET DATE: June 2024  SUBJECTIVE:  SUBJECTIVE STATEMENT: Patient reports he has been having some low back soreness since his last ortho visit.        Hand dominance: Right  PERTINENT HISTORY: CAD, DM, HTN, kidney issues  PAIN:  Are you having pain? Yes: NPRS scale: 1-2/10 Pain location: knees and shoulders Pain description: sharp Aggravating factors: IR, reaching OH, exercises Relieving factors: rest  PRECAUTIONS: None  RED FLAGS: None   WEIGHT BEARING RESTRICTIONS: No  FALLS:  Has patient fallen in last 6 months? No  LIVING ENVIRONMENT: Lives with: lives with their spouse Lives in: House/apartment Stairs: Yes: External: 1 steps; none Has following equipment at home: None  OCCUPATION: retired  PLOF: Independent  PATIENT GOALS: freedom of motion without pain  NEXT MD VISIT:   OBJECTIVE:  Note: Objective measures were completed at Evaluation unless otherwise noted.  DIAGNOSTIC FINDINGS:  IMPRESSION: 1. Mild  acromioclavicular degenerative change. 2. Mild subcortical cystic change in the lateral humeral head, nonspecific, but can be seen with rotator cuff pathology.  PATIENT SURVEYS:  Initial: FOTO 56 (goal 65)  12/03/23: goal was 65, score today was 77  Initial eval: 5x sit to stand: 16.61 seconds with min UE support  TUG deferred  12/03/23: 5x sit to stand: 9.67 seconds with min UE support  TUG: 9.44 sec  COGNITION: Overall cognitive status: Within functional limits for tasks assessed     SENSATION: WFL  POSTURE: Rounded shoulders, forward head  UPPER EXTREMITY ROM: * marked pain  A/P ROM Right eval Right 12/03/23 Left eval Left 12/03/23  Shoulder flexion 115/142* WFL 132 A/ROM [facility]  Shoulder extension      Shoulder abduction 108/120* WFL 108 A/ROM [facility]  Shoulder adduction      Shoulder internal rotation 56/70* WFL    Shoulder external rotation 54/55* WFL    Elbow flexion      Elbow extension      Wrist flexion      Wrist extension      Wrist ulnar deviation      Wrist radial deviation      Wrist pronation      Wrist supination      (Blank rows = not tested)  UPPER EXTREMITY MMT:  MMT Right eval Right 12/03/23 Left eval  Shoulder flexion 4- 4+   Shoulder extension 5 5   Shoulder abduction 4- 4+   Shoulder adduction     Shoulder internal rotation 4+ 5   Shoulder external rotation 4* 4+   Middle trapezius     Lower trapezius     Elbow flexion 4 4+   Elbow extension 5 5   Wrist flexion     Wrist extension 5 5   Wrist ulnar deviation     Wrist radial deviation     Wrist pronation     Wrist supination     Grip strength (lbs)     (Blank rows = not tested)  SHOULDER SPECIAL TESTS: Impingement tests: Neer impingement test: positive  and Hawkins/Kennedy impingement test: positive  Rotator cuff assessment: Empty can test: negative and Full can test: positive   JOINT MOBILITY TESTING:  Pain with AP mob, relief with PA  PALPATION:  Marked in SITS tendons,  IS muscle, LH biceps tendon   VESTIBULAR ASSESSMENT:  Orthostatic Hypotension: 12/21/2023: BP lying supine for 5 min:  128/55, HR 55 bpm BP After standing for 1 min: 137/63, HR 60 BP after standing for 3 min:  155/67, HR 65  12/21/2023:   Modified CTSIB: Condition 1-  30 sec, Condition 2- 30 sec, Condition 3- 30 sec, Condition 4- 30 sec.  Total- 120/120.  Some sway noted on condition 4 (eyes closed on foam pad). Seated ocular tracking:  normal/intact Saccades:  intact Head thrust:  slight nystagmus noted to each side    TODAY'S TREATMENT:                                                                                                                                         DATE:  12/23/23 Nustep x 5 min , level 3 (PT present to discuss status) Standing hamstring stretch 2 x 30 sec Standing quad stretch 2 x 30 sec Seated piriformis stretch 2 x 30 sec each Lunge to BOSU fwd x 10 each LE, then lateral x 10 each LE (using balance pole in right UE) Lateral band walks with yellow loop x 5 laps at barre Reverse lunges x 10 each LE Cone touches at counter: heavy verbal and visual cues for correct ankle, knee, hip and core strategies 3 x 10 each LE Switched to Static SLS to teach balance strategies: repeated approx 10 x each LE Marching on balance pad x 20 Squats on balance pad x 20  12/21/2023 Nustep level 4 x5 min with PT present to discuss status Vestibular Evaluation (see above) Seated VOR 1x1 for vertical and horizontal x30 sec each Standing on foam performing horizontal, then vertical head movements with CGA.  2x20 sec each (pt with most difficulty with horizontal movements) Seated pencil push-ups 2x10 Ambulation down hallway with head nods and head rotations x10 ft each Issued HEP (see below)  12/16/23 Nustep x 5 min , level 3 (PT present to discuss status) Lateral band walks with yellow loop x 5 laps at barre Marching on balance pad x 20 Squats on balance pad x 20 Lunges to  balance pad fwd x 20 then lateral x 20 on each LE (verbal cues for proper foot clearance) Leg Press 60 lbs 2 x 20 (Patient kept going after first 20 "I really like this one") - could probably do more resistance Hip matrix 2 x 10 25# bilateral : extension and abduction Cone touches at counter: heavy verbal and visual cues for correct ankle, knee, hip and core strategies 3 x 10 each LE Lunge to BOSU fwd x 10 each LE, then lateral x 10 each LE Step tap ups x 20 fwd then lateral x 10 each LE Step over and back (hurdle) laterals x 10 then fwd/back x 10 at barre (patient needed UE support on fwd/back but not on lateral stepping)   PATIENT EDUCATION: Education details: PT eval findings, anticipated POC, initial HEP, and postural awareness  Person educated: Patient Education method: Explanation, Demonstration, and Handouts Education comprehension: verbalized understanding, returned demonstration, and verbal cues required  HOME EXERCISE PROGRAM: Access Code: [email or web address] URL: [email or web address] Date: 12/23/2023 Prepared by: Mikey Kirschner  Exercises - Seated Shoulder  Flexion AAROM with Dowel  - 2 x daily - 7 x weekly - 1-2 sets - 10 reps - Seated Shoulder External Rotation AAROM with Cane and Hand in Neutral  - 2 x daily - 7 x weekly - 1-2 sets - 10 reps - Seated Shoulder Abduction Towel Slide at Table Top (Mirrored)  - 2 x daily - 7 x weekly - 1-2 sets - 10 reps - Seated Scapular Retraction  - 1 x daily - 7 x weekly - 1 sets - 10 reps - 2-3 sec hold - Supine Shoulder Flexion Extension AAROM with Dowel  - 1 x daily - 7 x weekly - 1 sets - 20 reps - Supine Shoulder Abduction AAROM with Dowel  - 1 x daily - 7 x weekly - 1 sets - 20 reps - Supine Shoulder External Rotation in 45 Degrees Abduction AAROM with Dowel  - 1 x daily - 7 x weekly - 1 sets - 20 reps - Supine Shoulder Flexion Extension Full Range AROM  - 1 x daily - 7 x weekly - 2 sets - 10 reps - Single Arm Serratus Punches  in Supine with Dumbbell  - 1 x daily - 7 x weekly - 2 sets - 10 reps - Sidelying Shoulder External Rotation  - 1 x daily - 7 x weekly - 2 sets - 10 reps - Standing Row with Anchored Resistance  - 1 x daily - 7 x weekly - 2 sets - 10 reps - Shoulder Extension with Resistance  - 1 x daily - 7 x weekly - 2 sets - 10 reps - Side Stepping with Resistance at Ankles and Counter Support  - 1 x daily - 7 x weekly - 3 sets - 10 reps - Standing Hip Extension with Counter Support  - 1 x daily - 7 x weekly - 3 sets - 10 reps - Heel Toe Raises with Counter Support  - 1 x daily - 7 x weekly - 3 sets - 10 reps - Single Leg Cone Touch  - 1 x daily - 7 x weekly - 3 sets - 10 reps - Pencil Pushups  - 1 x daily - 7 x weekly - 2 sets - 10 reps - Seated Gaze Stabilization with Head Rotation  - 1 x daily - 7 x weekly - 1-2 sets - 30 sec hold - Seated Gaze Stabilization with Head Nod  - 1 x daily - 7 x weekly - 1-2 sets - 30-60 sec hold - Walking with Head Nod  - 1 x daily - 7 x weekly - 2 sets - 10 reps - Walking with Head Rotation  - 1 x daily - 7 x weekly - 2 sets - 10 reps - Standing Hamstring Stretch on Chair  - 1 x daily - 7 x weekly - 1 sets - 3 reps - 30 sec hold - Quadricep Stretch with Chair and Counter Support  - 1 x daily - 7 x weekly - 1 sets - 3 reps - 30 sec hold - Seated Figure 4 Piriformis Stretch  - 1 x daily - 7 x weekly - 1 sets - 3 reps - 30 sec hold  Access Code: [email or web address] URL: [email or web address] Date: 12/21/2023 Prepared by: Clydie Braun Menke  Added Vestibular Exercises - Pencil Pushups  - 1 x daily - 7 x weekly - 2 sets - 10 reps - Seated Gaze Stabilization with Head Rotation  - 1 x daily - 7 x weekly - 1-2 sets - 30  sec hold - Seated Gaze Stabilization with Head Nod  - 1 x daily - 7 x weekly - 1-2 sets - 30-60 sec hold - Walking with Head Nod  - 1 x daily - 7 x weekly - 2 sets - 10 reps - Walking with Head Rotation  - 1 x daily - 7 x weekly - 2 sets - 10  reps  ASSESSMENT:  CLINICAL IMPRESSION: Mr Mesta is making steady progress.  He struggles with posture and hip strength along with flexibility.  We added hamstring, piriformis and quad stretches today.  He is extremely tight in hamstrings and has minimal lumbar lordosis.  He is learning more in the way of balance strategies and is beginning to use glut medius strategies more consistently.  He is compliant and well motivated.  We printed his LE flexibility exercises out to add to HEP.   Patient continues to require skilled PT to progress towards goal related activities and improved functional balance.  OBJECTIVE IMPAIRMENTS: decreased activity tolerance, decreased ROM, decreased strength, increased muscle spasms, impaired flexibility, impaired UE functional use, postural dysfunction, and pain.   ACTIVITY LIMITATIONS: bathing, dressing, reach over head, and hygiene/grooming  PARTICIPATION LIMITATIONS:  anything with OH activity   PERSONAL FACTORS: Age, Fitness, Time since onset of injury/illness/exacerbation, and 3+ comorbidities: DM, CKD, CAD  are also affecting patient's functional outcome.   REHAB POTENTIAL: Good  CLINICAL DECISION MAKING: Evolving/moderate complexity  EVALUATION COMPLEXITY: Moderate   GOALS: Goals reviewed with patient? Yes  SHORT TERM GOALS: Target date: 11/09/2023   Patient will be independent with initial HEP.  Baseline: 10/26/23 Goal status: MET  2.  Decreased shoulder pain with ADLs by 25%  Baseline: reduced pain today (10/26/23) Goal status:MET 11/16/23   3.  Balance assessed for fall risk Baseline: issued sit to stand for HEP Goal status: MET  Target date  : 01/11/2024  4. Patient will report 50% improved confidence in overall balance Baseline:  Goal status: Ongoing  LONG TERM GOALS: Target date: 12/07/2023   Patient will be independent with advanced/ongoing HEP to improve outcomes and carryover.  Baseline:  Goal status: MET 11/30/23  2.   Patient will report 75% improvement in R shoulder pain to improve QOL.  Baseline: 85% (11/25/23) Goal status: MET  3.  Patient to improve R shoulder AROM to [facility] without pain provocation to allow for increased ease of ADLs.  Baseline:  Goal status: MET 11/30/23  4.  Patient will demonstrate improved UE strength to 4+/5.  Baseline:  Goal status: MET 11/30/23  5.  Patient will report 50 on FOTO to demonstrate improved functional ability.  Baseline:  Goal status: MET 12/03/23  6.  Patient will be able to reach behind his back to dress without difficulty.   Baseline:  Goal status: MET 12/03/23  Target Date: 02/08/2024  7. Patient to report no falls or near falls over the course of the episode we are treating for balance and falls.   Baseline:  Goal status: INITIAL  8. Patient will be cleared of having vertigo symptoms Baseline:  Goal status: INITIAL  9. Patient will score 38 on Berg balance scale Baseline:  Goal status: INITIAL  10. Patient will improve on functional tests: (TUG and 5x sit to stand) by 1-2 seconds Baseline:  Goal status: INITIAL  PLAN:  PT FREQUENCY: 2x/week  PT DURATION: 8 weeks  PLANNED INTERVENTIONS: 97164- PT Re-evaluation, 97110-Therapeutic exercises, 97530- Therapeutic activity, 97112- Neuromuscular re-education, 97535- Self Care, 16109- Manual therapy, [id number]- Gait training,  84696- Canalith repositioning, 29528- Aquatic Therapy, [id number]- Electrical stimulation (unattended), [id number]- Vasopneumatic device, [id number]- Ultrasound, [id number]- Ionotophoresis 4mg /ml Dexamethasone, Patient/Family education, Balance training, Stair training, Taping, Dry Needling, Joint mobilization, Spinal mobilization, Vestibular training, Visual/preceptual remediation/compensation, Cryotherapy, and Moist heat  PLAN FOR NEXT SESSION: Nustep, repeat stretching and assess patient compliance with stretching,  Balance and gait training, fall prevention and LE weakness, vestibular rehab   Zyra Parrillo  B. Tulio Facundo, PT 12/23/23 2:45 PM [facility] [address] [place], [place] [address] Phone # [phone] Fax [phone]

## 2023-12-28 ENCOUNTER — Ambulatory Visit: Payer: Medicare Other

## 2023-12-28 DIAGNOSIS — R262 Difficulty in walking, not elsewhere classified: Secondary | ICD-10-CM

## 2023-12-28 DIAGNOSIS — R252 Cramp and spasm: Secondary | ICD-10-CM | POA: Diagnosis not present

## 2023-12-28 DIAGNOSIS — M6281 Muscle weakness (generalized): Secondary | ICD-10-CM

## 2023-12-28 DIAGNOSIS — R293 Abnormal posture: Secondary | ICD-10-CM

## 2023-12-28 DIAGNOSIS — R29898 Other symptoms and signs involving the musculoskeletal system: Secondary | ICD-10-CM | POA: Diagnosis not present

## 2023-12-28 DIAGNOSIS — R2681 Unsteadiness on feet: Secondary | ICD-10-CM

## 2023-12-28 NOTE — Therapy (Signed)
OUTPATIENT PHYSICAL THERAPY BALANCE TREATMENT  Patient Name: [name] MRN: [id number] DOB:Mar 10, 1941, 83 y.o., male Today's Date: 12/28/2023  END OF SESSION:  PT End of Session - 12/28/23 1109     Visit Number 13    Date for PT Re-Evaluation 01/31/24    Authorization Type MCR    Progress Note Due on Visit 20    PT Start Time 1100    PT Stop Time 1153    PT Time Calculation (min) 53 min    Activity Tolerance Patient tolerated treatment well    Behavior During Therapy [facility] for tasks assessed/performed                Past Medical History:  Diagnosis Date   Allergy    Apnea, sleep 07/28/07   NPSG [place], AHI 79.9   Carotid artery stenosis    mild 04/2010, 05/2012   Coronary heart disease 2008   Cath 2008, 60-70% proximal diagonal 1 stenosis,  RCA 40% stenosis.  nonischemic Lexiscan 03/2011   Diabetes mellitus    type II   GERD (gastroesophageal reflux disease)    Hyperlipidemia    Hypertension    Hypospadias    Past Surgical History:  Procedure Laterality Date   EYE SURGERY     NASAL SINUS SURGERY     NISSEN FUNDOPLICATION     TONSILLECTOMY     VEIN LIGATION AND STRIPPING     Patient Active Problem List   Diagnosis Date Noted   Dizziness 09/25/2020   CKD (chronic kidney disease), stage II 09/25/2020   Type 2 diabetes mellitus with complication, without long-term current use of insulin (HCC) 09/21/2019   Coronary artery disease involving native coronary artery of native heart without angina pectoris 03/23/2017   Bilateral carotid artery stenosis 03/23/2017   Dyslipidemia 03/23/2017   IBS (irritable bowel syndrome) 12/12/2014   Erectile dysfunction 01/19/2014   Diabetic polyneuropathy (HCC) 11/20/2013   Shortness of breath 09/26/2012   Chronic insomnia 03/25/2012   PALPITATIONS 11/19/2010   URINARY INCONTINENCE, PASSIVE, CONTINUOUS LEAKAGE 11/19/2010   Disorder resulting from impaired renal function 11/13/2009   VARICOSE VEINS, LOWER EXTREMITIES  09/17/2009   BPH with urinary obstruction 09/17/2009   RESTLESS LEG SYNDROME 02/12/2009   HIP PAIN, RIGHT 02/12/2009   DIAPHRAGMATIC DISORDER 10/26/2008   Diabetes mellitus without complication (HCC) 01/08/2008   Hyperlipidemia 01/08/2008   Essential hypertension 01/08/2008   Coronary atherosclerosis 01/08/2008   Allergic rhinitis 01/08/2008   Obstructive sleep apnea 01/08/2008    PCP: Shirline Frees, NP   REFERRING PROVIDER: Shirline Frees, NP   REFERRING DIAG: Current: Balance and Falls risk Initial: M79.621 (ICD-10-CM) - Pain in right upper arm   THERAPY DIAG:  Abnormal posture  Unsteadiness on feet  Difficulty in walking, not elsewhere classified  Cramp and spasm  Muscle weakness (generalized)  Rationale for Evaluation and Treatment: Rehabilitation  ONSET DATE: June 2024  SUBJECTIVE:  SUBJECTIVE STATEMENT: Patient reports he has been doing well.  No c/o back pain        Hand dominance: Right  PERTINENT HISTORY: CAD, DM, HTN, kidney issues  PAIN:  Are you having pain? Yes: NPRS scale: 1-2/10 Pain location: knees and shoulders Pain description: sharp Aggravating factors: IR, reaching OH, exercises Relieving factors: rest  PRECAUTIONS: None  RED FLAGS: None   WEIGHT BEARING RESTRICTIONS: No  FALLS:  Has patient fallen in last 6 months? No  LIVING ENVIRONMENT: Lives with: lives with their spouse Lives in: House/apartment Stairs: Yes: External: 1 steps; none Has following equipment at home: None  OCCUPATION: retired  PLOF: Independent  PATIENT GOALS: freedom of motion without pain  NEXT MD VISIT:   OBJECTIVE:  Note: Objective measures were completed at Evaluation unless otherwise noted.  DIAGNOSTIC FINDINGS:  IMPRESSION: 1. Mild acromioclavicular  degenerative change. 2. Mild subcortical cystic change in the lateral humeral head, nonspecific, but can be seen with rotator cuff pathology.  PATIENT SURVEYS:  Initial: FOTO 56 (goal 65)  12/03/23: goal was 65, score today was 77  Initial eval: 5x sit to stand: 16.61 seconds with min UE support  TUG deferred  12/03/23: 5x sit to stand: 9.67 seconds with min UE support  TUG: 9.44 sec  COGNITION: Overall cognitive status: Within functional limits for tasks assessed     SENSATION: WFL  POSTURE: Rounded shoulders, forward head  UPPER EXTREMITY ROM: * marked pain  A/P ROM Right eval Right 12/03/23 Left eval Left 12/03/23  Shoulder flexion 115/142* WFL 132 A/ROM [facility]  Shoulder extension      Shoulder abduction 108/120* WFL 108 A/ROM [facility]  Shoulder adduction      Shoulder internal rotation 56/70* WFL    Shoulder external rotation 54/55* WFL    Elbow flexion      Elbow extension      Wrist flexion      Wrist extension      Wrist ulnar deviation      Wrist radial deviation      Wrist pronation      Wrist supination      (Blank rows = not tested)  UPPER EXTREMITY MMT:  MMT Right eval Right 12/03/23 Left eval  Shoulder flexion 4- 4+   Shoulder extension 5 5   Shoulder abduction 4- 4+   Shoulder adduction     Shoulder internal rotation 4+ 5   Shoulder external rotation 4* 4+   Middle trapezius     Lower trapezius     Elbow flexion 4 4+   Elbow extension 5 5   Wrist flexion     Wrist extension 5 5   Wrist ulnar deviation     Wrist radial deviation     Wrist pronation     Wrist supination     Grip strength (lbs)     (Blank rows = not tested)  SHOULDER SPECIAL TESTS: Impingement tests: Neer impingement test: positive  and Hawkins/Kennedy impingement test: positive  Rotator cuff assessment: Empty can test: negative and Full can test: positive   JOINT MOBILITY TESTING:  Pain with AP mob, relief with PA  PALPATION:  Marked in SITS tendons, IS muscle, LH  biceps tendon   VESTIBULAR ASSESSMENT:  Orthostatic Hypotension: 12/21/2023: BP lying supine for 5 min:  128/55, HR 55 bpm BP After standing for 1 min: 137/63, HR 60 BP after standing for 3 min:  155/67, HR 65  12/21/2023:   Modified CTSIB: Condition 1- 30 sec, Condition  2- 30 sec, Condition 3- 30 sec, Condition 4- 30 sec.  Total- 120/120.  Some sway noted on condition 4 (eyes closed on foam pad). Seated ocular tracking:  normal/intact Saccades:  intact Head thrust:  slight nystagmus noted to each side    TODAY'S TREATMENT:                                                                                                                                         DATE:  12/28/23 Nustep x 5 min , level 3 (PT present to discuss status) Rocker board x 2 min Step up: 4" 2 x 10 fwd and lateral 2 x 10 (on laterals encouraged patient to do without UE support) Lateral band walks with yellow loop x 5 laps at barre Sit to stand with 10 lb kb x 10 Squat to chair with 10 lb kb x 10 Marching on balance pad x 20 Squats on balance pad x 20 Lunge to balance pad fwd x 10 each LE, then lateral x 10 each LE (using balance pole in right UE) Seated piriformis stretch 2 x 30 sec each Reverse lunges x 10 each LE Static SLS to teach balance strategies: repeated approx 10 x each LE  12/23/23 Nustep x 5 min , level 3 (PT present to discuss status) Standing hamstring stretch 2 x 30 sec Standing quad stretch 2 x 30 sec Seated piriformis stretch 2 x 30 sec each Lunge to BOSU fwd x 10 each LE, then lateral x 10 each LE (using balance pole in right UE) Lateral band walks with yellow loop x 5 laps at barre Reverse lunges x 10 each LE Cone touches at counter: heavy verbal and visual cues for correct ankle, knee, hip and core strategies 3 x 10 each LE Switched to Static SLS to teach balance strategies: repeated approx 10 x each LE Marching on balance pad x 20 Squats on balance pad x 20  12/21/2023 Nustep level 4  x5 min with PT present to discuss status Vestibular Evaluation (see above) Seated VOR 1x1 for vertical and horizontal x30 sec each Standing on foam performing horizontal, then vertical head movements with CGA.  2x20 sec each (pt with most difficulty with horizontal movements) Seated pencil push-ups 2x10 Ambulation down hallway with head nods and head rotations x10 ft each Issued HEP (see below)  PATIENT EDUCATION: Education details: PT eval findings, anticipated POC, initial HEP, and postural awareness  Person educated: Patient Education method: Explanation, Demonstration, and Handouts Education comprehension: verbalized understanding, returned demonstration, and verbal cues required  HOME EXERCISE PROGRAM: Access Code: [email or web address] URL: [email or web address] Date: 12/23/2023 Prepared by: Mikey Kirschner  Exercises - Seated Shoulder Flexion AAROM with Dowel  - 2 x daily - 7 x weekly - 1-2 sets - 10 reps - Seated Shoulder External Rotation AAROM with Cane and Hand in Neutral  - 2 x daily - 7 x weekly -  1-2 sets - 10 reps - Seated Shoulder Abduction Towel Slide at Table Top (Mirrored)  - 2 x daily - 7 x weekly - 1-2 sets - 10 reps - Seated Scapular Retraction  - 1 x daily - 7 x weekly - 1 sets - 10 reps - 2-3 sec hold - Supine Shoulder Flexion Extension AAROM with Dowel  - 1 x daily - 7 x weekly - 1 sets - 20 reps - Supine Shoulder Abduction AAROM with Dowel  - 1 x daily - 7 x weekly - 1 sets - 20 reps - Supine Shoulder External Rotation in 45 Degrees Abduction AAROM with Dowel  - 1 x daily - 7 x weekly - 1 sets - 20 reps - Supine Shoulder Flexion Extension Full Range AROM  - 1 x daily - 7 x weekly - 2 sets - 10 reps - Single Arm Serratus Punches in Supine with Dumbbell  - 1 x daily - 7 x weekly - 2 sets - 10 reps - Sidelying Shoulder External Rotation  - 1 x daily - 7 x weekly - 2 sets - 10 reps - Standing Row with Anchored Resistance  - 1 x daily - 7 x weekly - 2 sets - 10  reps - Shoulder Extension with Resistance  - 1 x daily - 7 x weekly - 2 sets - 10 reps - Side Stepping with Resistance at Ankles and Counter Support  - 1 x daily - 7 x weekly - 3 sets - 10 reps - Standing Hip Extension with Counter Support  - 1 x daily - 7 x weekly - 3 sets - 10 reps - Heel Toe Raises with Counter Support  - 1 x daily - 7 x weekly - 3 sets - 10 reps - Single Leg Cone Touch  - 1 x daily - 7 x weekly - 3 sets - 10 reps - Pencil Pushups  - 1 x daily - 7 x weekly - 2 sets - 10 reps - Seated Gaze Stabilization with Head Rotation  - 1 x daily - 7 x weekly - 1-2 sets - 30 sec hold - Seated Gaze Stabilization with Head Nod  - 1 x daily - 7 x weekly - 1-2 sets - 30-60 sec hold - Walking with Head Nod  - 1 x daily - 7 x weekly - 2 sets - 10 reps - Walking with Head Rotation  - 1 x daily - 7 x weekly - 2 sets - 10 reps - Standing Hamstring Stretch on Chair  - 1 x daily - 7 x weekly - 1 sets - 3 reps - 30 sec hold - Quadricep Stretch with Chair and Counter Support  - 1 x daily - 7 x weekly - 1 sets - 3 reps - 30 sec hold - Seated Figure 4 Piriformis Stretch  - 1 x daily - 7 x weekly - 1 sets - 3 reps - 30 sec hold  Access Code: [email or web address] URL: [email or web address] Date: 12/21/2023 Prepared by: Clydie Braun Menke  Added Vestibular Exercises - Pencil Pushups  - 1 x daily - 7 x weekly - 2 sets - 10 reps - Seated Gaze Stabilization with Head Rotation  - 1 x daily - 7 x weekly - 1-2 sets - 30 sec hold - Seated Gaze Stabilization with Head Nod  - 1 x daily - 7 x weekly - 1-2 sets - 30-60 sec hold - Walking with Head Nod  - 1 x daily - 7 x weekly - 2  sets - 10 reps - Walking with Head Rotation  - 1 x daily - 7 x weekly - 2 sets - 10 reps  ASSESSMENT:  CLINICAL IMPRESSION: Mr Ian [name] was able to complete all exercises today without pain.  We are focusing in hip strength and ankle stability along with continued emphasis on postural elongation.  He should continue to do well.      Patient continues to require skilled PT to progress towards goal related activities and improved functional balance.  OBJECTIVE IMPAIRMENTS: decreased activity tolerance, decreased ROM, decreased strength, increased muscle spasms, impaired flexibility, impaired UE functional use, postural dysfunction, and pain.   ACTIVITY LIMITATIONS: bathing, dressing, reach over head, and hygiene/grooming  PARTICIPATION LIMITATIONS:  anything with OH activity   PERSONAL FACTORS: Age, Fitness, Time since onset of injury/illness/exacerbation, and 3+ comorbidities: DM, CKD, CAD  are also affecting patient's functional outcome.   REHAB POTENTIAL: Good  CLINICAL DECISION MAKING: Evolving/moderate complexity  EVALUATION COMPLEXITY: Moderate   GOALS: Goals reviewed with patient? Yes  SHORT TERM GOALS: Target date: 11/09/2023   Patient will be independent with initial HEP.  Baseline: 10/26/23 Goal status: MET  2.  Decreased shoulder pain with ADLs by 25%  Baseline: reduced pain today (10/26/23) Goal status:MET 11/16/23   3.  Balance assessed for fall risk Baseline: issued sit to stand for HEP Goal status: MET  Target date  : 01/11/2024  4. Patient will report 50% improved confidence in overall balance Baseline:  Goal status: Ongoing  LONG TERM GOALS: Target date: 12/07/2023   Patient will be independent with advanced/ongoing HEP to improve outcomes and carryover.  Baseline:  Goal status: MET 11/30/23  2.  Patient will report 75% improvement in R shoulder pain to improve QOL.  Baseline: 85% (11/25/23) Goal status: MET  3.  Patient to improve R shoulder AROM to [facility] without pain provocation to allow for increased ease of ADLs.  Baseline:  Goal status: MET 11/30/23  4.  Patient will demonstrate improved UE strength to 4+/5.  Baseline:  Goal status: MET 11/30/23  5.  Patient will report 42 on FOTO to demonstrate improved functional ability.  Baseline:  Goal status: MET 12/03/23  6.  Patient  will be able to reach behind his back to dress without difficulty.   Baseline:  Goal status: MET 12/03/23  Target Date: 02/08/2024  7. Patient to report no falls or near falls over the course of the episode we are treating for balance and falls.   Baseline:  Goal status: INITIAL  8. Patient will be cleared of having vertigo symptoms Baseline:  Goal status: INITIAL  9. Patient will score 38 on Berg balance scale Baseline:  Goal status: INITIAL  10. Patient will improve on functional tests: (TUG and 5x sit to stand) by 1-2 seconds Baseline:  Goal status: INITIAL  PLAN:  PT FREQUENCY: 2x/week  PT DURATION: 8 weeks  PLANNED INTERVENTIONS: 97164- PT Re-evaluation, 97110-Therapeutic exercises, 97530- Therapeutic activity, [id number]- Neuromuscular re-education, 97535- Self Care, [address]- Manual therapy, [id number]- Gait training, [phone]- Canalith repositioning, [id number]- Aquatic Therapy, 97014- Electrical stimulation (unattended), 97016- Vasopneumatic device, [id number]- Ultrasound, 09811- Ionotophoresis 4mg /ml Dexamethasone, Patient/Family education, Balance training, Stair training, Taping, Dry Needling, Joint mobilization, Spinal mobilization, Vestibular training, Visual/preceptual remediation/compensation, Cryotherapy, and Moist heat  PLAN FOR NEXT SESSION: Nustep, repeat stretching and assess patient compliance with stretching,  Balance and gait training, fall prevention and LE weakness, vestibular rehab   Keasia Dubose B. Nevyn Bossman, PT 12/28/23 7:39 PM [facility] [address],  [address] [place], [place] [address] Phone # [phone] Fax [phone]

## 2023-12-30 ENCOUNTER — Ambulatory Visit: Payer: Medicare Other | Admitting: Rehabilitative and Restorative Service Providers"

## 2024-01-03 ENCOUNTER — Encounter: Payer: Self-pay | Admitting: Adult Health

## 2024-01-04 ENCOUNTER — Ambulatory Visit: Payer: Medicare Other

## 2024-01-04 DIAGNOSIS — R29898 Other symptoms and signs involving the musculoskeletal system: Secondary | ICD-10-CM | POA: Diagnosis not present

## 2024-01-04 DIAGNOSIS — R252 Cramp and spasm: Secondary | ICD-10-CM | POA: Diagnosis not present

## 2024-01-04 DIAGNOSIS — R262 Difficulty in walking, not elsewhere classified: Secondary | ICD-10-CM

## 2024-01-04 DIAGNOSIS — R293 Abnormal posture: Secondary | ICD-10-CM

## 2024-01-04 DIAGNOSIS — R2681 Unsteadiness on feet: Secondary | ICD-10-CM

## 2024-01-04 DIAGNOSIS — M6281 Muscle weakness (generalized): Secondary | ICD-10-CM

## 2024-01-04 NOTE — Therapy (Signed)
 OUTPATIENT PHYSICAL THERAPY BALANCE TREATMENT  Patient Name: Ian [name] MRN: [id number] DOB:[birth year], 83 y.o., male Today's Date: 01/04/2024  END OF SESSION:  PT End of Session - 01/04/24 1107     Visit Number 14    Date for PT Re-Evaluation 01/31/24    Authorization Type MCR    Progress Note Due on Visit 20    PT Start Time 1100    PT Stop Time 1145    PT Time Calculation (min) 45 min    Activity Tolerance Patient tolerated treatment well    Behavior During Therapy [facility] for tasks assessed/performed                Past Medical History:  Diagnosis Date   Allergy    Apnea, sleep 07/28/07   NPSG [place], AHI 79.9   Carotid artery stenosis    mild 04/2010, 05/2012   Coronary heart disease 2008   Cath 2008, 60-70% proximal diagonal 1 stenosis,  RCA 40% stenosis.  nonischemic Lexiscan 03/2011   Diabetes mellitus    type II   GERD (gastroesophageal reflux disease)    Hyperlipidemia    Hypertension    Hypospadias    Past Surgical History:  Procedure Laterality Date   EYE SURGERY     NASAL SINUS SURGERY     NISSEN FUNDOPLICATION     TONSILLECTOMY     VEIN LIGATION AND STRIPPING     Patient Active Problem List   Diagnosis Date Noted   Dizziness 09/25/2020   CKD (chronic kidney disease), stage II 09/25/2020   Type 2 diabetes mellitus with complication, without long-term current use of insulin (HCC) 09/21/2019   Coronary artery disease involving native coronary artery of native heart without angina pectoris 03/23/2017   Bilateral carotid artery stenosis 03/23/2017   Dyslipidemia 03/23/2017   IBS (irritable bowel syndrome) 12/12/2014   Erectile dysfunction 01/19/2014   Diabetic polyneuropathy (HCC) 11/20/2013   Shortness of breath 09/26/2012   Chronic insomnia 03/25/2012   PALPITATIONS 11/19/2010   URINARY INCONTINENCE, PASSIVE, CONTINUOUS LEAKAGE 11/19/2010   Disorder resulting from impaired renal function 11/13/2009   VARICOSE VEINS, LOWER EXTREMITIES  09/17/2009   BPH with urinary obstruction 09/17/2009   RESTLESS LEG SYNDROME 02/12/2009   HIP PAIN, RIGHT 02/12/2009   DIAPHRAGMATIC DISORDER 10/26/2008   Diabetes mellitus without complication (HCC) 01/08/2008   Hyperlipidemia 01/08/2008   Essential hypertension 01/08/2008   Coronary atherosclerosis 01/08/2008   Allergic rhinitis 01/08/2008   Obstructive sleep apnea 01/08/2008    PCP: Shirline Frees, NP   REFERRING PROVIDER: Shirline Frees, NP   REFERRING DIAG: Current: Balance and Falls risk Initial: M79.621 (ICD-10-CM) - Pain in right upper arm   THERAPY DIAG:  Abnormal posture  Unsteadiness on feet  Difficulty in walking, not elsewhere classified  Cramp and spasm  Muscle weakness (generalized)  Rationale for Evaluation and Treatment: Rehabilitation  ONSET DATE: June 2024  SUBJECTIVE:  SUBJECTIVE STATEMENT: Patient reports he has been doing well. "I've been working on my balance"        Hand dominance: Right  PERTINENT HISTORY: CAD, DM, HTN, kidney issues  PAIN:  Are you having pain? Yes: NPRS scale: 1-2/10 Pain location: knees and shoulders Pain description: sharp Aggravating factors: IR, reaching OH, exercises Relieving factors: rest  PRECAUTIONS: None  RED FLAGS: None   WEIGHT BEARING RESTRICTIONS: No  FALLS:  Has patient fallen in last 6 months? No  LIVING ENVIRONMENT: Lives with: lives with their spouse Lives in: House/apartment Stairs: Yes: External: 1 steps; none Has following equipment at home: None  OCCUPATION: retired  PLOF: Independent  PATIENT GOALS: freedom of motion without pain  NEXT MD VISIT:   OBJECTIVE:  Note: Objective measures were completed at Evaluation unless otherwise noted.  DIAGNOSTIC FINDINGS:  IMPRESSION: 1. Mild  acromioclavicular degenerative change. 2. Mild subcortical cystic change in the lateral humeral head, nonspecific, but can be seen with rotator cuff pathology.  PATIENT SURVEYS:  Initial: FOTO 56 (goal 65)  12/03/23: goal was 65, score today was 77  Initial eval: 5x sit to stand: 16.61 seconds with min UE support  TUG deferred  12/03/23: 5x sit to stand: 9.67 seconds with min UE support  TUG: 9.44 sec  COGNITION: Overall cognitive status: Within functional limits for tasks assessed     SENSATION: WFL  POSTURE: Rounded shoulders, forward head  UPPER EXTREMITY ROM: * marked pain  A/P ROM Right eval Right 12/03/23 Left eval Left 12/03/23  Shoulder flexion 115/142* WFL 132 A/ROM [facility]  Shoulder extension      Shoulder abduction 108/120* WFL 108 A/ROM [facility]  Shoulder adduction      Shoulder internal rotation 56/70* WFL    Shoulder external rotation 54/55* WFL    Elbow flexion      Elbow extension      Wrist flexion      Wrist extension      Wrist ulnar deviation      Wrist radial deviation      Wrist pronation      Wrist supination      (Blank rows = not tested)  UPPER EXTREMITY MMT:  MMT Right eval Right 12/03/23 Left eval  Shoulder flexion 4- 4+   Shoulder extension 5 5   Shoulder abduction 4- 4+   Shoulder adduction     Shoulder internal rotation 4+ 5   Shoulder external rotation 4* 4+   Middle trapezius     Lower trapezius     Elbow flexion 4 4+   Elbow extension 5 5   Wrist flexion     Wrist extension 5 5   Wrist ulnar deviation     Wrist radial deviation     Wrist pronation     Wrist supination     Grip strength (lbs)     (Blank rows = not tested)  SHOULDER SPECIAL TESTS: Impingement tests: Neer impingement test: positive  and Hawkins/Kennedy impingement test: positive  Rotator cuff assessment: Empty can test: negative and Full can test: positive   JOINT MOBILITY TESTING:  Pain with AP mob, relief with PA  PALPATION:  Marked in SITS tendons,  IS muscle, LH biceps tendon   VESTIBULAR ASSESSMENT:  Orthostatic Hypotension: 12/21/2023: BP lying supine for 5 min:  128/55, HR 55 bpm BP After standing for 1 min: 137/63, HR 60 BP after standing for 3 min:  155/67, HR 65  12/21/2023:   Modified CTSIB: Condition 1- 30 sec,  Condition 2- 30 sec, Condition 3- 30 sec, Condition 4- 30 sec.  Total- 120/120.  Some sway noted on condition 4 (eyes closed on foam pad). Seated ocular tracking:  normal/intact Saccades:  intact Head thrust:  slight nystagmus noted to each side    TODAY'S TREATMENT:                                                                                                                                         DATE:  01/04/24 Nustep x 6 min , level 5 (PT present to discuss status) Rocker board x 2 min Step up: 6" x 10 fwd and lateral x 10 (on laterals encouraged patient to do without UE support and with vc's for full knee extension) Seated clam with yellow loop x 20 Lateral band walks with yellow loop x 5 laps at barre Sit to stand with 10 lb kb x 10 Squat to chair with 10 lb kb x 10 Dead lift 2 x 10 with 10 lb kb (heavy vc's for correct technique) Static SLS to teach balance strategies: repeated approx 10 x each LE Seated LAQ with 5 lbs 2 x 10 Seated march with 5 lbs x 20 Seated hip ER with 5 lbs 2 x 10 Reverse lunges x 10 each LE  12/28/23 Nustep x 5 min , level 3 (PT present to discuss status) Rocker board x 2 min Step up: 4" 2 x 10 fwd and lateral 2 x 10 (on laterals encouraged patient to do without UE support) Lateral band walks with yellow loop x 5 laps at barre Sit to stand with 10 lb kb x 10 Squat to chair with 10 lb kb x 10 Marching on balance pad x 20 Squats on balance pad x 20 Lunge to balance pad fwd x 10 each LE, then lateral x 10 each LE (using balance pole in right UE) Seated piriformis stretch 2 x 30 sec each Reverse lunges x 10 each LE Static SLS to teach balance strategies: repeated approx 10 x  each LE  12/23/23 Nustep x 5 min , level 3 (PT present to discuss status) Standing hamstring stretch 2 x 30 sec Standing quad stretch 2 x 30 sec Seated piriformis stretch 2 x 30 sec each Lunge to BOSU fwd x 10 each LE, then lateral x 10 each LE (using balance pole in right UE) Lateral band walks with yellow loop x 5 laps at barre Reverse lunges x 10 each LE Cone touches at counter: heavy verbal and visual cues for correct ankle, knee, hip and core strategies 3 x 10 each LE Switched to Static SLS to teach balance strategies: repeated approx 10 x each LE Marching on balance pad x 20 Squats on balance pad x 20  PATIENT EDUCATION: Education details: PT eval findings, anticipated POC, initial HEP, and postural awareness  Person educated: Patient Education method: Explanation, Demonstration, and Handouts Education comprehension:  verbalized understanding, returned demonstration, and verbal cues required  HOME EXERCISE PROGRAM: Access Code: [email or web address] URL: [email or web address] Date: 12/23/2023 Prepared by: Mikey Kirschner  Exercises - Seated Shoulder Flexion AAROM with Dowel  - 2 x daily - 7 x weekly - 1-2 sets - 10 reps - Seated Shoulder External Rotation AAROM with Cane and Hand in Neutral  - 2 x daily - 7 x weekly - 1-2 sets - 10 reps - Seated Shoulder Abduction Towel Slide at Table Top (Mirrored)  - 2 x daily - 7 x weekly - 1-2 sets - 10 reps - Seated Scapular Retraction  - 1 x daily - 7 x weekly - 1 sets - 10 reps - 2-3 sec hold - Supine Shoulder Flexion Extension AAROM with Dowel  - 1 x daily - 7 x weekly - 1 sets - 20 reps - Supine Shoulder Abduction AAROM with Dowel  - 1 x daily - 7 x weekly - 1 sets - 20 reps - Supine Shoulder External Rotation in 45 Degrees Abduction AAROM with Dowel  - 1 x daily - 7 x weekly - 1 sets - 20 reps - Supine Shoulder Flexion Extension Full Range AROM  - 1 x daily - 7 x weekly - 2 sets - 10 reps - Single Arm Serratus Punches in Supine  with Dumbbell  - 1 x daily - 7 x weekly - 2 sets - 10 reps - Sidelying Shoulder External Rotation  - 1 x daily - 7 x weekly - 2 sets - 10 reps - Standing Row with Anchored Resistance  - 1 x daily - 7 x weekly - 2 sets - 10 reps - Shoulder Extension with Resistance  - 1 x daily - 7 x weekly - 2 sets - 10 reps - Side Stepping with Resistance at Ankles and Counter Support  - 1 x daily - 7 x weekly - 3 sets - 10 reps - Standing Hip Extension with Counter Support  - 1 x daily - 7 x weekly - 3 sets - 10 reps - Heel Toe Raises with Counter Support  - 1 x daily - 7 x weekly - 3 sets - 10 reps - Single Leg Cone Touch  - 1 x daily - 7 x weekly - 3 sets - 10 reps - Pencil Pushups  - 1 x daily - 7 x weekly - 2 sets - 10 reps - Seated Gaze Stabilization with Head Rotation  - 1 x daily - 7 x weekly - 1-2 sets - 30 sec hold - Seated Gaze Stabilization with Head Nod  - 1 x daily - 7 x weekly - 1-2 sets - 30-60 sec hold - Walking with Head Nod  - 1 x daily - 7 x weekly - 2 sets - 10 reps - Walking with Head Rotation  - 1 x daily - 7 x weekly - 2 sets - 10 reps - Standing Hamstring Stretch on Chair  - 1 x daily - 7 x weekly - 1 sets - 3 reps - 30 sec hold - Quadricep Stretch with Chair and Counter Support  - 1 x daily - 7 x weekly - 1 sets - 3 reps - 30 sec hold - Seated Figure 4 Piriformis Stretch  - 1 x daily - 7 x weekly - 1 sets - 3 reps - 30 sec hold  Access Code: [email or web address] URL: [email or web address] Date: 12/21/2023 Prepared by: Clydie Braun Menke  Added Vestibular Exercises - Pencil Pushups  - 1 x daily - 7 x  weekly - 2 sets - 10 reps - Seated Gaze Stabilization with Head Rotation  - 1 x daily - 7 x weekly - 1-2 sets - 30 sec hold - Seated Gaze Stabilization with Head Nod  - 1 x daily - 7 x weekly - 1-2 sets - 30-60 sec hold - Walking with Head Nod  - 1 x daily - 7 x weekly - 2 sets - 10 reps - Walking with Head Rotation  - 1 x daily - 7 x weekly - 2 sets - 10 reps  ASSESSMENT:  CLINICAL  IMPRESSION: Brighten is progressing appropriately.  He is now able to do approx 5 sec each LE in static SLS indicating improved strength and use of balance strategies.  He is compliant and well motivated.  He should continue to do well.     Patient continues to require skilled PT to progress towards goal related activities and improved functional balance.  OBJECTIVE IMPAIRMENTS: decreased activity tolerance, decreased ROM, decreased strength, increased muscle spasms, impaired flexibility, impaired UE functional use, postural dysfunction, and pain.   ACTIVITY LIMITATIONS: bathing, dressing, reach over head, and hygiene/grooming  PARTICIPATION LIMITATIONS:  anything with OH activity   PERSONAL FACTORS: Age, Fitness, Time since onset of injury/illness/exacerbation, and 3+ comorbidities: DM, CKD, CAD  are also affecting patient's functional outcome.   REHAB POTENTIAL: Good  CLINICAL DECISION MAKING: Evolving/moderate complexity  EVALUATION COMPLEXITY: Moderate   GOALS: Goals reviewed with patient? Yes  SHORT TERM GOALS: Target date: 11/09/2023   Patient will be independent with initial HEP.  Baseline: 10/26/23 Goal status: MET  2.  Decreased shoulder pain with ADLs by 25%  Baseline: reduced pain today (10/26/23) Goal status:MET 11/16/23   3.  Balance assessed for fall risk Baseline: issued sit to stand for HEP Goal status: MET  Target date  : 01/11/2024  4. Patient will report 50% improved confidence in overall balance Baseline:  Goal status: Ongoing  LONG TERM GOALS: Target date: 12/07/2023   Patient will be independent with advanced/ongoing HEP to improve outcomes and carryover.  Baseline:  Goal status: MET 11/30/23  2.  Patient will report 75% improvement in R shoulder pain to improve QOL.  Baseline: 85% (11/25/23) Goal status: MET  3.  Patient to improve R shoulder AROM to [facility] without pain provocation to allow for increased ease of ADLs.  Baseline:  Goal status: MET  11/30/23  4.  Patient will demonstrate improved UE strength to 4+/5.  Baseline:  Goal status: MET 11/30/23  5.  Patient will report 5 on FOTO to demonstrate improved functional ability.  Baseline:  Goal status: MET 12/03/23  6.  Patient will be able to reach behind his back to dress without difficulty.   Baseline:  Goal status: MET 12/03/23  Target Date: 02/08/2024  7. Patient to report no falls or near falls over the course of the episode we are treating for balance and falls.   Baseline:  Goal status: INITIAL  8. Patient will be cleared of having vertigo symptoms Baseline:  Goal status: INITIAL  9. Patient will score 38 on Berg balance scale Baseline:  Goal status: INITIAL  10. Patient will improve on functional tests: (TUG and 5x sit to stand) by 1-2 seconds Baseline:  Goal status: INITIAL  PLAN:  PT FREQUENCY: 2x/week  PT DURATION: 8 weeks  PLANNED INTERVENTIONS: 97164- PT Re-evaluation, 97110-Therapeutic exercises, 97530- Therapeutic activity, [id number]- Neuromuscular re-education, 97535- Self Care, 16109- Manual therapy, [id number]- Gait training, [phone]- Canalith repositioning, [id number]- Aquatic Therapy, 97014-  Electrical stimulation (unattended), 97016- Vasopneumatic device, [id number]- Ultrasound, 62952- Ionotophoresis 4mg /ml Dexamethasone, Patient/Family education, Balance training, Stair training, Taping, Dry Needling, Joint mobilization, Spinal mobilization, Vestibular training, Visual/preceptual remediation/compensation, Cryotherapy, and Moist heat  PLAN FOR NEXT SESSION: Nustep,   Balance and gait training, fall prevention and LE weakness, vestibular rehab   Kimbery Harwood B. Jahkeem Kurka, PT 01/04/24 6:07 PM [facility] [address] [place], [place] [address] Phone # [phone] Fax [phone]

## 2024-01-06 ENCOUNTER — Ambulatory Visit: Payer: Medicare Other | Admitting: Rehabilitative and Restorative Service Providers"

## 2024-01-06 ENCOUNTER — Encounter: Payer: Self-pay | Admitting: Rehabilitative and Restorative Service Providers"

## 2024-01-06 DIAGNOSIS — R262 Difficulty in walking, not elsewhere classified: Secondary | ICD-10-CM | POA: Diagnosis not present

## 2024-01-06 DIAGNOSIS — R293 Abnormal posture: Secondary | ICD-10-CM | POA: Diagnosis not present

## 2024-01-06 DIAGNOSIS — R2681 Unsteadiness on feet: Secondary | ICD-10-CM

## 2024-01-06 DIAGNOSIS — M6281 Muscle weakness (generalized): Secondary | ICD-10-CM | POA: Diagnosis not present

## 2024-01-06 DIAGNOSIS — R29898 Other symptoms and signs involving the musculoskeletal system: Secondary | ICD-10-CM | POA: Diagnosis not present

## 2024-01-06 DIAGNOSIS — R252 Cramp and spasm: Secondary | ICD-10-CM | POA: Diagnosis not present

## 2024-01-06 NOTE — Therapy (Signed)
 OUTPATIENT PHYSICAL THERAPY BALANCE TREATMENT  Patient Name: Ian [name] MRN: [id number] DOB:[birth year], 83 y.o., male Today's Date: 01/06/2024  END OF SESSION:  PT End of Session - 01/06/24 1101     Visit Number 15    Date for PT Re-Evaluation 01/31/24    Authorization Type MCR    Progress Note Due on Visit 20    PT Start Time 1100    PT Stop Time 1140    PT Time Calculation (min) 40 min    Activity Tolerance Patient tolerated treatment well    Behavior During Therapy [facility] for tasks assessed/performed                Past Medical History:  Diagnosis Date   Allergy    Apnea, sleep 07/28/07   NPSG [place], AHI 79.9   Carotid artery stenosis    mild 04/2010, 05/2012   Coronary heart disease 2008   Cath 2008, 60-70% proximal diagonal 1 stenosis,  RCA 40% stenosis.  nonischemic Lexiscan 03/2011   Diabetes mellitus    type II   GERD (gastroesophageal reflux disease)    Hyperlipidemia    Hypertension    Hypospadias    Past Surgical History:  Procedure Laterality Date   EYE SURGERY     NASAL SINUS SURGERY     NISSEN FUNDOPLICATION     TONSILLECTOMY     VEIN LIGATION AND STRIPPING     Patient Active Problem List   Diagnosis Date Noted   Dizziness 09/25/2020   CKD (chronic kidney disease), stage II 09/25/2020   Type 2 diabetes mellitus with complication, without long-term current use of insulin (HCC) 09/21/2019   Coronary artery disease involving native coronary artery of native heart without angina pectoris 03/23/2017   Bilateral carotid artery stenosis 03/23/2017   Dyslipidemia 03/23/2017   IBS (irritable bowel syndrome) 12/12/2014   Erectile dysfunction 01/19/2014   Diabetic polyneuropathy (HCC) 11/20/2013   Shortness of breath 09/26/2012   Chronic insomnia 03/25/2012   PALPITATIONS 11/19/2010   URINARY INCONTINENCE, PASSIVE, CONTINUOUS LEAKAGE 11/19/2010   Disorder resulting from impaired renal function 11/13/2009   VARICOSE VEINS, LOWER EXTREMITIES  09/17/2009   BPH with urinary obstruction 09/17/2009   RESTLESS LEG SYNDROME 02/12/2009   HIP PAIN, RIGHT 02/12/2009   DIAPHRAGMATIC DISORDER 10/26/2008   Diabetes mellitus without complication (HCC) 01/08/2008   Hyperlipidemia 01/08/2008   Essential hypertension 01/08/2008   Coronary atherosclerosis 01/08/2008   Allergic rhinitis 01/08/2008   Obstructive sleep apnea 01/08/2008    PCP: Shirline Frees, NP   REFERRING PROVIDER: Shirline Frees, NP   REFERRING DIAG: Current: Balance and Falls risk Initial: M79.621 (ICD-10-CM) - Pain in right upper arm   THERAPY DIAG:  Abnormal posture  Unsteadiness on feet  Difficulty in walking, not elsewhere classified  Rationale for Evaluation and Treatment: Rehabilitation  ONSET DATE: June 2024  SUBJECTIVE:  SUBJECTIVE STATEMENT: Patient reports that he is doing his exercises, but has not noticed any difference in his dizziness yet.  Patient continues to deny dizziness specifically with rolling over in bed.  Denies current pain.      Hand dominance: Right  PERTINENT HISTORY: CAD, DM, HTN, kidney issues  PAIN:  Are you having pain? Yes: NPRS scale: 0-2/10 Pain location: knees and shoulders Pain description: sharp Aggravating factors: IR, reaching OH, exercises Relieving factors: rest  PRECAUTIONS: None  RED FLAGS: None   WEIGHT BEARING RESTRICTIONS: No  FALLS:  Has patient fallen in last 6 months? No  LIVING ENVIRONMENT: Lives with: lives with their spouse Lives in: House/apartment Stairs: Yes: External: 1 steps; none Has following equipment at home: None  OCCUPATION: retired  PLOF: Independent  PATIENT GOALS: freedom of motion without pain  NEXT MD VISIT:   OBJECTIVE:  Note: Objective measures were completed at Evaluation unless  otherwise noted.  DIAGNOSTIC FINDINGS:  IMPRESSION: 1. Mild acromioclavicular degenerative change. 2. Mild subcortical cystic change in the lateral humeral head, nonspecific, but can be seen with rotator cuff pathology.  PATIENT SURVEYS:  Initial: FOTO 56 (goal 65)  12/03/23: goal was 65, score today was 77  Initial eval: 5x sit to stand: 16.61 seconds with min UE support  TUG deferred  12/03/23: 5x sit to stand: 9.67 seconds with min UE support  TUG: 9.44 sec  COGNITION: Overall cognitive status: Within functional limits for tasks assessed     SENSATION: WFL  POSTURE: Rounded shoulders, forward head  UPPER EXTREMITY ROM: * marked pain  A/P ROM Right eval Right 12/03/23 Left eval Left 12/03/23  Shoulder flexion 115/142* WFL 132 A/ROM [facility]  Shoulder extension      Shoulder abduction 108/120* WFL 108 A/ROM [facility]  Shoulder adduction      Shoulder internal rotation 56/70* WFL    Shoulder external rotation 54/55* WFL    Elbow flexion      Elbow extension      Wrist flexion      Wrist extension      Wrist ulnar deviation      Wrist radial deviation      Wrist pronation      Wrist supination      (Blank rows = not tested)  UPPER EXTREMITY MMT:  MMT Right eval Right 12/03/23 Left eval  Shoulder flexion 4- 4+   Shoulder extension 5 5   Shoulder abduction 4- 4+   Shoulder adduction     Shoulder internal rotation 4+ 5   Shoulder external rotation 4* 4+   Middle trapezius     Lower trapezius     Elbow flexion 4 4+   Elbow extension 5 5   Wrist flexion     Wrist extension 5 5   Wrist ulnar deviation     Wrist radial deviation     Wrist pronation     Wrist supination     Grip strength (lbs)     (Blank rows = not tested)  SHOULDER SPECIAL TESTS: Impingement tests: Neer impingement test: positive  and Hawkins/Kennedy impingement test: positive  Rotator cuff assessment: Empty can test: negative and Full can test: positive   JOINT MOBILITY TESTING:  Pain with  AP mob, relief with PA  PALPATION:  Marked in SITS tendons, IS muscle, LH biceps tendon   VESTIBULAR ASSESSMENT:  Orthostatic Hypotension: 12/21/2023: BP lying supine for 5 min:  128/55, HR 55 bpm BP After standing for 1 min: 137/63, HR 60 BP  after standing for 3 min:  155/67, HR 65  12/21/2023:   Modified CTSIB: Condition 1- 30 sec, Condition 2- 30 sec, Condition 3- 30 sec, Condition 4- 30 sec.  Total- 120/120.  Some sway noted on condition 4 (eyes closed on foam pad). Seated ocular tracking:  normal/intact Saccades:  intact Head thrust:  slight nystagmus noted to each side  01/06/2024: Gilberto Better negative on right side Dix Hallpike slight positive on the left side *note, patient reports most dizziness with going from supine to sit and sit to stand    TODAY'S TREATMENT:                                                                                                                                         DATE:  01/06/2024: Gilberto Better negative on right side Dix Hallpike slight positive on the left side and proceeded with Epley Maneuver for canalith repositioning x2 Standing on foam pad performing head rotations with UE support as needed and CGA 3x10 sec Ambulation down hallway performing head nods and head rotations x2 laps each Education on the 3 systems that feed into balance and how various factors can impair each (for example, diabetes)   01/04/24 Nustep x 6 min , level 5 (PT present to discuss status) Rocker board x 2 min Step up: 6" x 10 fwd and lateral x 10 (on laterals encouraged patient to do without UE support and with vc's for full knee extension) Seated clam with yellow loop x 20 Lateral band walks with yellow loop x 5 laps at barre Sit to stand with 10 lb kb x 10 Squat to chair with 10 lb kb x 10 Dead lift 2 x 10 with 10 lb kb (heavy vc's for correct technique) Static SLS to teach balance strategies: repeated approx 10 x each LE Seated LAQ with 5 lbs 2 x  10 Seated march with 5 lbs x 20 Seated hip ER with 5 lbs 2 x 10 Reverse lunges x 10 each LE  12/28/23 Nustep x 5 min , level 3 (PT present to discuss status) Rocker board x 2 min Step up: 4" 2 x 10 fwd and lateral 2 x 10 (on laterals encouraged patient to do without UE support) Lateral band walks with yellow loop x 5 laps at barre Sit to stand with 10 lb kb x 10 Squat to chair with 10 lb kb x 10 Marching on balance pad x 20 Squats on balance pad x 20 Lunge to balance pad fwd x 10 each LE, then lateral x 10 each LE (using balance pole in right UE) Seated piriformis stretch 2 x 30 sec each Reverse lunges x 10 each LE Static SLS to teach balance strategies: repeated approx 10 x each LE    PATIENT EDUCATION: Education details: PT eval findings, anticipated POC, initial HEP, and postural awareness  Person educated: Patient Education method: Explanation, Demonstration,  and Handouts Education comprehension: verbalized understanding, returned demonstration, and verbal cues required  HOME EXERCISE PROGRAM: Access Code: [email or web address] URL: [email or web address] Date: 12/23/2023 Prepared by: Mikey Kirschner  Exercises - Seated Shoulder Flexion AAROM with Dowel  - 2 x daily - 7 x weekly - 1-2 sets - 10 reps - Seated Shoulder External Rotation AAROM with Cane and Hand in Neutral  - 2 x daily - 7 x weekly - 1-2 sets - 10 reps - Seated Shoulder Abduction Towel Slide at Table Top (Mirrored)  - 2 x daily - 7 x weekly - 1-2 sets - 10 reps - Seated Scapular Retraction  - 1 x daily - 7 x weekly - 1 sets - 10 reps - 2-3 sec hold - Supine Shoulder Flexion Extension AAROM with Dowel  - 1 x daily - 7 x weekly - 1 sets - 20 reps - Supine Shoulder Abduction AAROM with Dowel  - 1 x daily - 7 x weekly - 1 sets - 20 reps - Supine Shoulder External Rotation in 45 Degrees Abduction AAROM with Dowel  - 1 x daily - 7 x weekly - 1 sets - 20 reps - Supine Shoulder Flexion Extension Full Range AROM  - 1  x daily - 7 x weekly - 2 sets - 10 reps - Single Arm Serratus Punches in Supine with Dumbbell  - 1 x daily - 7 x weekly - 2 sets - 10 reps - Sidelying Shoulder External Rotation  - 1 x daily - 7 x weekly - 2 sets - 10 reps - Standing Row with Anchored Resistance  - 1 x daily - 7 x weekly - 2 sets - 10 reps - Shoulder Extension with Resistance  - 1 x daily - 7 x weekly - 2 sets - 10 reps - Side Stepping with Resistance at Ankles and Counter Support  - 1 x daily - 7 x weekly - 3 sets - 10 reps - Standing Hip Extension with Counter Support  - 1 x daily - 7 x weekly - 3 sets - 10 reps - Heel Toe Raises with Counter Support  - 1 x daily - 7 x weekly - 3 sets - 10 reps - Single Leg Cone Touch  - 1 x daily - 7 x weekly - 3 sets - 10 reps - Pencil Pushups  - 1 x daily - 7 x weekly - 2 sets - 10 reps - Seated Gaze Stabilization with Head Rotation  - 1 x daily - 7 x weekly - 1-2 sets - 30 sec hold - Seated Gaze Stabilization with Head Nod  - 1 x daily - 7 x weekly - 1-2 sets - 30-60 sec hold - Walking with Head Nod  - 1 x daily - 7 x weekly - 2 sets - 10 reps - Walking with Head Rotation  - 1 x daily - 7 x weekly - 2 sets - 10 reps - Standing Hamstring Stretch on Chair  - 1 x daily - 7 x weekly - 1 sets - 3 reps - 30 sec hold - Quadricep Stretch with Chair and Counter Support  - 1 x daily - 7 x weekly - 1 sets - 3 reps - 30 sec hold - Seated Figure 4 Piriformis Stretch  - 1 x daily - 7 x weekly - 1 sets - 3 reps - 30 sec hold   ASSESSMENT:  CLINICAL IMPRESSION: Mr Anger presents to skilled PT reporting that he is still having dizziness throughout the day.  Continues to not report a noticeable increase with dizziness with rolling over in bed, but opted to assess for BPPV with Gilberto Better.  Patient did report dizziness with left Gilberto Better with slight nystagmus noted, so proceeded with canalith repositioning.  Patient with most dizziness with supine to sit and sit to stand than with Epley Maneuver.   Patient continues to have increased dizziness with horizontal head rotations during standing.  Educated patient to continue to perform his HEP for vestibular rehab daily.  OBJECTIVE IMPAIRMENTS: decreased activity tolerance, decreased ROM, decreased strength, increased muscle spasms, impaired flexibility, impaired UE functional use, postural dysfunction, and pain.   ACTIVITY LIMITATIONS: bathing, dressing, reach over head, and hygiene/grooming  PARTICIPATION LIMITATIONS:  anything with OH activity   PERSONAL FACTORS: Age, Fitness, Time since onset of injury/illness/exacerbation, and 3+ comorbidities: DM, CKD, CAD  are also affecting patient's functional outcome.   REHAB POTENTIAL: Good  CLINICAL DECISION MAKING: Evolving/moderate complexity  EVALUATION COMPLEXITY: Moderate   GOALS: Goals reviewed with patient? Yes  SHORT TERM GOALS: Target date: 11/09/2023   Patient will be independent with initial HEP.  Baseline: 10/26/23 Goal status: MET  2.  Decreased shoulder pain with ADLs by 25%  Baseline: reduced pain today (10/26/23) Goal status:MET 11/16/23   3.  Balance assessed for fall risk Baseline: issued sit to stand for HEP Goal status: MET  Target date  : 01/11/2024  4. Patient will report 50% improved confidence in overall balance Baseline:  Goal status: Ongoing  LONG TERM GOALS: Target date: 12/07/2023   Patient will be independent with advanced/ongoing HEP to improve outcomes and carryover.  Baseline:  Goal status: MET 11/30/23  2.  Patient will report 75% improvement in R shoulder pain to improve QOL.  Baseline: 85% (11/25/23) Goal status: MET  3.  Patient to improve R shoulder AROM to [facility] without pain provocation to allow for increased ease of ADLs.  Baseline:  Goal status: MET 11/30/23  4.  Patient will demonstrate improved UE strength to 4+/5.  Baseline:  Goal status: MET 11/30/23  5.  Patient will report 74 on FOTO to demonstrate improved functional ability.   Baseline:  Goal status: MET 12/03/23  6.  Patient will be able to reach behind his back to dress without difficulty.   Baseline:  Goal status: MET 12/03/23  Target Date: 02/08/2024  7. Patient to report no falls or near falls over the course of the episode we are treating for balance and falls.   Baseline:  Goal status: INITIAL  8. Patient will be cleared of having vertigo symptoms Baseline:  Goal status: INITIAL  9. Patient will score 38 on Berg balance scale Baseline:  Goal status: INITIAL  10. Patient will improve on functional tests: (TUG and 5x sit to stand) by 1-2 seconds Baseline:  Goal status: INITIAL  PLAN:  PT FREQUENCY: 2x/week  PT DURATION: 8 weeks  PLANNED INTERVENTIONS: 97164- PT Re-evaluation, 97110-Therapeutic exercises, 97530- Therapeutic activity, [id number]- Neuromuscular re-education, 97535- Self Care, 16109- Manual therapy, [id number]- Gait training, [phone]- Canalith repositioning, [id number]- Aquatic Therapy, 97014- Electrical stimulation (unattended), 97016- Vasopneumatic device, 97035- Ultrasound, 09811- Ionotophoresis 4mg /ml Dexamethasone, Patient/Family education, Balance training, Stair training, Taping, Dry Needling, Joint mobilization, Spinal mobilization, Vestibular training, Visual/preceptual remediation/compensation, Cryotherapy, and Moist heat  PLAN FOR NEXT SESSION: Nustep,   Balance and gait training, fall prevention and LE weakness, vestibular rehab   Reather Laurence, PT, DPT 01/06/24, 1:27 PM  [facility] [address] [place], [place] [address] Phone #  [phone] Fax [phone]

## 2024-01-09 ENCOUNTER — Other Ambulatory Visit: Payer: Self-pay | Admitting: Adult Health

## 2024-01-11 ENCOUNTER — Ambulatory Visit: Payer: Medicare Other | Attending: Adult Health

## 2024-01-11 DIAGNOSIS — M6281 Muscle weakness (generalized): Secondary | ICD-10-CM

## 2024-01-11 DIAGNOSIS — R293 Abnormal posture: Secondary | ICD-10-CM

## 2024-01-11 DIAGNOSIS — R252 Cramp and spasm: Secondary | ICD-10-CM

## 2024-01-11 DIAGNOSIS — R2681 Unsteadiness on feet: Secondary | ICD-10-CM | POA: Diagnosis not present

## 2024-01-11 DIAGNOSIS — R262 Difficulty in walking, not elsewhere classified: Secondary | ICD-10-CM

## 2024-01-11 NOTE — Therapy (Signed)
 OUTPATIENT PHYSICAL THERAPY BALANCE TREATMENT  Patient Name: Ian [name] MRN: [id number] DOB:1941/05/17, 83 y.o., male Today's Date: 01/11/2024  END OF SESSION:  PT End of Session - 01/11/24 1246     Visit Number 11    Date for PT Re-Evaluation 01/31/24    Authorization Type MCR    Progress Note Due on Visit 20    PT Start Time 1235    PT Stop Time 1315    PT Time Calculation (min) 40 min    Activity Tolerance Patient tolerated treatment well    Behavior During Therapy [facility] for tasks assessed/performed                Past Medical History:  Diagnosis Date   Allergy    Apnea, sleep 07/28/07   NPSG [place], AHI 79.9   Carotid artery stenosis    mild 04/2010, 05/2012   Coronary heart disease 2008   Cath 2008, 60-70% proximal diagonal 1 stenosis,  RCA 40% stenosis.  nonischemic Lexiscan 03/2011   Diabetes mellitus    type II   GERD (gastroesophageal reflux disease)    Hyperlipidemia    Hypertension    Hypospadias    Past Surgical History:  Procedure Laterality Date   EYE SURGERY     NASAL SINUS SURGERY     NISSEN FUNDOPLICATION     TONSILLECTOMY     VEIN LIGATION AND STRIPPING     Patient Active Problem List   Diagnosis Date Noted   Dizziness 09/25/2020   CKD (chronic kidney disease), stage II 09/25/2020   Type 2 diabetes mellitus with complication, without long-term current use of insulin (HCC) 09/21/2019   Coronary artery disease involving native coronary artery of native heart without angina pectoris 03/23/2017   Bilateral carotid artery stenosis 03/23/2017   Dyslipidemia 03/23/2017   IBS (irritable bowel syndrome) 12/12/2014   Erectile dysfunction 01/19/2014   Diabetic polyneuropathy (HCC) 11/20/2013   Shortness of breath 09/26/2012   Chronic insomnia 03/25/2012   PALPITATIONS 11/19/2010   URINARY INCONTINENCE, PASSIVE, CONTINUOUS LEAKAGE 11/19/2010   Disorder resulting from impaired renal function 11/13/2009   VARICOSE VEINS, LOWER EXTREMITIES  09/17/2009   BPH with urinary obstruction 09/17/2009   RESTLESS LEG SYNDROME 02/12/2009   HIP PAIN, RIGHT 02/12/2009   DIAPHRAGMATIC DISORDER 10/26/2008   Diabetes mellitus without complication (HCC) 01/08/2008   Hyperlipidemia 01/08/2008   Essential hypertension 01/08/2008   Coronary atherosclerosis 01/08/2008   Allergic rhinitis 01/08/2008   Obstructive sleep apnea 01/08/2008    PCP: Shirline Frees, NP   REFERRING PROVIDER: Shirline Frees, NP   REFERRING DIAG: Current: Balance and Falls risk Initial: M79.621 (ICD-10-CM) - Pain in right upper arm   THERAPY DIAG:  Unsteadiness on feet  Difficulty in walking, not elsewhere classified  Muscle weakness (generalized)  Cramp and spasm  Abnormal posture  Rationale for Evaluation and Treatment: Rehabilitation  ONSET DATE: June 2024  SUBJECTIVE:  SUBJECTIVE STATEMENT: Patient reports that he is doing better since last treatment session with vestibular PT.  I haven't had as much dizziness but I have some questions about some of the exercises.       Hand dominance: Right  PERTINENT HISTORY: CAD, DM, HTN, kidney issues  PAIN:  Are you having pain? Yes: NPRS scale: 0-2/10 Pain location: knees and shoulders Pain description: sharp Aggravating factors: IR, reaching OH, exercises Relieving factors: rest  PRECAUTIONS: None  RED FLAGS: None   WEIGHT BEARING RESTRICTIONS: No  FALLS:  Has patient fallen in last 6 months? No  LIVING ENVIRONMENT: Lives with: lives with their spouse Lives in: House/apartment Stairs: Yes: External: 1 steps; none Has following equipment at home: None  OCCUPATION: retired  PLOF: Independent  PATIENT GOALS: freedom of motion without pain  NEXT MD VISIT:   OBJECTIVE:  Note: Objective measures were  completed at Evaluation unless otherwise noted.  DIAGNOSTIC FINDINGS:  IMPRESSION: 1. Mild acromioclavicular degenerative change. 2. Mild subcortical cystic change in the lateral humeral head, nonspecific, but can be seen with rotator cuff pathology.  PATIENT SURVEYS:  Initial: FOTO 56 (goal 65)  12/03/23: goal was 65, score today was 77  Initial eval: 5x sit to stand: 16.61 seconds with min UE support  TUG deferred  12/03/23: 5x sit to stand: 9.67 seconds with min UE support  TUG: 9.44 sec  COGNITION: Overall cognitive status: Within functional limits for tasks assessed     SENSATION: WFL  POSTURE: Rounded shoulders, forward head  UPPER EXTREMITY ROM: * marked pain  A/P ROM Right eval Right 12/03/23 Left eval Left 12/03/23  Shoulder flexion 115/142* WFL 132 A/ROM [facility]  Shoulder extension      Shoulder abduction 108/120* WFL 108 A/ROM [facility]  Shoulder adduction      Shoulder internal rotation 56/70* WFL    Shoulder external rotation 54/55* WFL    Elbow flexion      Elbow extension      Wrist flexion      Wrist extension      Wrist ulnar deviation      Wrist radial deviation      Wrist pronation      Wrist supination      (Blank rows = not tested)  UPPER EXTREMITY MMT:  MMT Right eval Right 12/03/23 Left eval  Shoulder flexion 4- 4+   Shoulder extension 5 5   Shoulder abduction 4- 4+   Shoulder adduction     Shoulder internal rotation 4+ 5   Shoulder external rotation 4* 4+   Middle trapezius     Lower trapezius     Elbow flexion 4 4+   Elbow extension 5 5   Wrist flexion     Wrist extension 5 5   Wrist ulnar deviation     Wrist radial deviation     Wrist pronation     Wrist supination     Grip strength (lbs)     (Blank rows = not tested)  SHOULDER SPECIAL TESTS: Impingement tests: Neer impingement test: positive  and Hawkins/Kennedy impingement test: positive  Rotator cuff assessment: Empty can test: negative and Full can test: positive    JOINT MOBILITY TESTING:  Pain with AP mob, relief with PA  PALPATION:  Marked in SITS tendons, IS muscle, LH biceps tendon   VESTIBULAR ASSESSMENT:  Orthostatic Hypotension: 12/21/2023: BP lying supine for 5 min:  128/55, HR 55 bpm BP After standing for 1 min: 137/63, HR 60 BP after standing  for 3 min:  155/67, HR 65  12/21/2023:   Modified CTSIB: Condition 1- 30 sec, Condition 2- 30 sec, Condition 3- 30 sec, Condition 4- 30 sec.  Total- 120/120.  Some sway noted on condition 4 (eyes closed on foam pad). Seated ocular tracking:  normal/intact Saccades:  intact Head thrust:  slight nystagmus noted to each side  01/06/2024: Gilberto Better negative on right side Dix Hallpike slight positive on the left side *note, patient reports most dizziness with going from supine to sit and sit to stand    TODAY'S TREATMENT:                                                                                                                                         DATE:  01/11/24 Nustep x 5 min , level 5 (PT present to discuss status) Seated piriformis stretch x 30 sec each LE Lunges to BOSU x 10 eac LE fwd then lateral Lateral band walks with yellow loop x 5 laps at barre Single leg deadlift by counter with 5 lb kb  2 x 10 Hip matrix extension and abduction 2 x 10 each with 45 lbs SLS x 5 each LE attempting 10 sec Rocker board x 2 min Sit to stand with 10 lb kb x 10 Squat to chair with 10 lb kb x 10 Dead lift 2 x 10 with 10 lb kb (heavy vc's for correct technique) Static SLS to teach balance strategies: repeated approx 10 x each LE  01/06/2024: Dix Hallpike negative on right side Dix Hallpike slight positive on the left side and proceeded with Epley Maneuver for canalith repositioning x2 Standing on foam pad performing head rotations with UE support as needed and CGA 3x10 sec Ambulation down hallway performing head nods and head rotations x2 laps each Education on the 3 systems that feed into  balance and how various factors can impair each (for example, diabetes)   01/04/24 Nustep x 6 min , level 5 (PT present to discuss status) Rocker board x 2 min Step up: 6" x 10 fwd and lateral x 10 (on laterals encouraged patient to do without UE support and with vc's for full knee extension) Seated clam with yellow loop x 20 Lateral band walks with yellow loop x 5 laps at barre Sit to stand with 10 lb kb x 10 Squat to chair with 10 lb kb x 10 Dead lift 2 x 10 with 10 lb kb (heavy vc's for correct technique) Static SLS to teach balance strategies: repeated approx 10 x each LE Seated LAQ with 5 lbs 2 x 10 Seated march with 5 lbs x 20 Seated hip ER with 5 lbs 2 x 10  12/28/23 Nustep x 5 min , level 3 (PT present to discuss status) Rocker board x 2 min Step up: 4" 2 x 10 fwd and lateral 2 x 10 (on laterals encouraged patient to  do without UE support) Lateral band walks with yellow loop x 5 laps at barre Sit to stand with 10 lb kb x 10 Squat to chair with 10 lb kb x 10 Marching on balance pad x 20 Squats on balance pad x 20 Lunge to balance pad fwd x 10 each LE, then lateral x 10 each LE (using balance pole in right UE) Seated piriformis stretch 2 x 30 sec each Reverse lunges x 10 each LE Static SLS to teach balance strategies: repeated approx 10 x each LE    PATIENT EDUCATION: Education details: PT eval findings, anticipated POC, initial HEP, and postural awareness  Person educated: Patient Education method: Explanation, Demonstration, and Handouts Education comprehension: verbalized understanding, returned demonstration, and verbal cues required  HOME EXERCISE PROGRAM: Access Code: [email or web address] URL: [email or web address] Date: 12/23/2023 Prepared by: Mikey Kirschner  Exercises - Seated Shoulder Flexion AAROM with Dowel  - 2 x daily - 7 x weekly - 1-2 sets - 10 reps - Seated Shoulder External Rotation AAROM with Cane and Hand in Neutral  - 2 x daily - 7 x weekly -  1-2 sets - 10 reps - Seated Shoulder Abduction Towel Slide at Table Top (Mirrored)  - 2 x daily - 7 x weekly - 1-2 sets - 10 reps - Seated Scapular Retraction  - 1 x daily - 7 x weekly - 1 sets - 10 reps - 2-3 sec hold - Supine Shoulder Flexion Extension AAROM with Dowel  - 1 x daily - 7 x weekly - 1 sets - 20 reps - Supine Shoulder Abduction AAROM with Dowel  - 1 x daily - 7 x weekly - 1 sets - 20 reps - Supine Shoulder External Rotation in 45 Degrees Abduction AAROM with Dowel  - 1 x daily - 7 x weekly - 1 sets - 20 reps - Supine Shoulder Flexion Extension Full Range AROM  - 1 x daily - 7 x weekly - 2 sets - 10 reps - Single Arm Serratus Punches in Supine with Dumbbell  - 1 x daily - 7 x weekly - 2 sets - 10 reps - Sidelying Shoulder External Rotation  - 1 x daily - 7 x weekly - 2 sets - 10 reps - Standing Row with Anchored Resistance  - 1 x daily - 7 x weekly - 2 sets - 10 reps - Shoulder Extension with Resistance  - 1 x daily - 7 x weekly - 2 sets - 10 reps - Side Stepping with Resistance at Ankles and Counter Support  - 1 x daily - 7 x weekly - 3 sets - 10 reps - Standing Hip Extension with Counter Support  - 1 x daily - 7 x weekly - 3 sets - 10 reps - Heel Toe Raises with Counter Support  - 1 x daily - 7 x weekly - 3 sets - 10 reps - Single Leg Cone Touch  - 1 x daily - 7 x weekly - 3 sets - 10 reps - Pencil Pushups  - 1 x daily - 7 x weekly - 2 sets - 10 reps - Seated Gaze Stabilization with Head Rotation  - 1 x daily - 7 x weekly - 1-2 sets - 30 sec hold - Seated Gaze Stabilization with Head Nod  - 1 x daily - 7 x weekly - 1-2 sets - 30-60 sec hold - Walking with Head Nod  - 1 x daily - 7 x weekly - 2 sets - 10 reps - Walking with  Head Rotation  - 1 x daily - 7 x weekly - 2 sets - 10 reps - Standing Hamstring Stretch on Chair  - 1 x daily - 7 x weekly - 1 sets - 3 reps - 30 sec hold - Quadricep Stretch with Chair and Counter Support  - 1 x daily - 7 x weekly - 1 sets - 3 reps - 30 sec  hold - Seated Figure 4 Piriformis Stretch  - 1 x daily - 7 x weekly - 1 sets - 3 reps - 30 sec hold   ASSESSMENT:  CLINICAL IMPRESSION: Mr Granade is progressing appropriately.  He is having some reduced dizziness.  He continues to have some lack of use of hip stabilizers on balance activities and is quite weak in hip extensors.  He is well motivated and should continue to improve.    OBJECTIVE IMPAIRMENTS: decreased activity tolerance, decreased ROM, decreased strength, increased muscle spasms, impaired flexibility, impaired UE functional use, postural dysfunction, and pain.   ACTIVITY LIMITATIONS: bathing, dressing, reach over head, and hygiene/grooming  PARTICIPATION LIMITATIONS:  anything with OH activity   PERSONAL FACTORS: Age, Fitness, Time since onset of injury/illness/exacerbation, and 3+ comorbidities: DM, CKD, CAD  are also affecting patient's functional outcome.   REHAB POTENTIAL: Good  CLINICAL DECISION MAKING: Evolving/moderate complexity  EVALUATION COMPLEXITY: Moderate   GOALS: Goals reviewed with patient? Yes  SHORT TERM GOALS: Target date: 11/09/2023   Patient will be independent with initial HEP.  Baseline: 10/26/23 Goal status: MET  2.  Decreased shoulder pain with ADLs by 25%  Baseline: reduced pain today (10/26/23) Goal status:MET 11/16/23   3.  Balance assessed for fall risk Baseline: issued sit to stand for HEP Goal status: MET  Target date  : 01/11/2024  4. Patient will report 50% improved confidence in overall balance Baseline:  Goal status: Ongoing  LONG TERM GOALS: Target date: 12/07/2023   Patient will be independent with advanced/ongoing HEP to improve outcomes and carryover.  Baseline:  Goal status: MET 11/30/23  2.  Patient will report 75% improvement in R shoulder pain to improve QOL.  Baseline: 85% (11/25/23) Goal status: MET  3.  Patient to improve R shoulder AROM to [facility] without pain provocation to allow for increased ease of ADLs.   Baseline:  Goal status: MET 11/30/23  4.  Patient will demonstrate improved UE strength to 4+/5.  Baseline:  Goal status: MET 11/30/23  5.  Patient will report 75 on FOTO to demonstrate improved functional ability.  Baseline:  Goal status: MET 12/03/23  6.  Patient will be able to reach behind his back to dress without difficulty.   Baseline:  Goal status: MET 12/03/23  Target Date: 02/08/2024  7. Patient to report no falls or near falls over the course of the episode we are treating for balance and falls.   Baseline:  Goal status: INITIAL  8. Patient will be cleared of having vertigo symptoms Baseline:  Goal status: INITIAL  9. Patient will score 38 on Berg balance scale Baseline:  Goal status: INITIAL  10. Patient will improve on functional tests: (TUG and 5x sit to stand) by 1-2 seconds Baseline:  Goal status: INITIAL  PLAN:  PT FREQUENCY: 2x/week  PT DURATION: 8 weeks  PLANNED INTERVENTIONS: 97164- PT Re-evaluation, 97110-Therapeutic exercises, 97530- Therapeutic activity, [id number]- Neuromuscular re-education, 97535- Self Care, [id number]- Manual therapy, [id number]- Gait training, [phone]- Canalith repositioning, [id number]- Aquatic Therapy, 97014- Electrical stimulation (unattended), 97016- Vasopneumatic device, [id number]- Ultrasound, [id number]- Ionotophoresis 4mg /ml Dexamethasone, Patient/Family  education, Balance training, Stair training, Taping, Dry Needling, Joint mobilization, Spinal mobilization, Vestibular training, Visual/preceptual remediation/compensation, Cryotherapy, and Moist heat  PLAN FOR NEXT SESSION: Nustep, Balance and gait training, fall prevention and LE weakness, vestibular rehab   Tamberly Pomplun B. Annel Zunker, PT 01/11/24 9:58 PM [facility] [address], [address] [place], [place] [address] Phone # [phone] Fax [phone]

## 2024-01-13 ENCOUNTER — Encounter: Payer: Self-pay | Admitting: Rehabilitative and Restorative Service Providers"

## 2024-01-13 ENCOUNTER — Ambulatory Visit: Payer: Medicare Other | Admitting: Rehabilitative and Restorative Service Providers"

## 2024-01-13 DIAGNOSIS — R262 Difficulty in walking, not elsewhere classified: Secondary | ICD-10-CM | POA: Diagnosis not present

## 2024-01-13 DIAGNOSIS — M6281 Muscle weakness (generalized): Secondary | ICD-10-CM

## 2024-01-13 DIAGNOSIS — R293 Abnormal posture: Secondary | ICD-10-CM | POA: Diagnosis not present

## 2024-01-13 DIAGNOSIS — R2681 Unsteadiness on feet: Secondary | ICD-10-CM

## 2024-01-13 DIAGNOSIS — R252 Cramp and spasm: Secondary | ICD-10-CM | POA: Diagnosis not present

## 2024-01-13 NOTE — Therapy (Signed)
 OUTPATIENT PHYSICAL THERAPY BALANCE TREATMENT  Patient Name: Ian [name] MRN: [id number] DOB:September 14, 1941, 83 y.o., male Today's Date: 01/13/2024  END OF SESSION:  PT End of Session - 01/13/24 1108     Visit Number 17    Date for PT Re-Evaluation 01/31/24    Authorization Type MCR    Progress Note Due on Visit 20    PT Start Time 1102    PT Stop Time 1140    PT Time Calculation (min) 38 min    Activity Tolerance Patient tolerated treatment well    Behavior During Therapy [facility] for tasks assessed/performed                Past Medical History:  Diagnosis Date   Allergy    Apnea, sleep 07/28/07   NPSG [place], AHI 79.9   Carotid artery stenosis    mild 04/2010, 05/2012   Coronary heart disease 2008   Cath 2008, 60-70% proximal diagonal 1 stenosis,  RCA 40% stenosis.  nonischemic Lexiscan 03/2011   Diabetes mellitus    type II   GERD (gastroesophageal reflux disease)    Hyperlipidemia    Hypertension    Hypospadias    Past Surgical History:  Procedure Laterality Date   EYE SURGERY     NASAL SINUS SURGERY     NISSEN FUNDOPLICATION     TONSILLECTOMY     VEIN LIGATION AND STRIPPING     Patient Active Problem List   Diagnosis Date Noted   Dizziness 09/25/2020   CKD (chronic kidney disease), stage II 09/25/2020   Type 2 diabetes mellitus with complication, without long-term current use of insulin (HCC) 09/21/2019   Coronary artery disease involving native coronary artery of native heart without angina pectoris 03/23/2017   Bilateral carotid artery stenosis 03/23/2017   Dyslipidemia 03/23/2017   IBS (irritable bowel syndrome) 12/12/2014   Erectile dysfunction 01/19/2014   Diabetic polyneuropathy (HCC) 11/20/2013   Shortness of breath 09/26/2012   Chronic insomnia 03/25/2012   PALPITATIONS 11/19/2010   URINARY INCONTINENCE, PASSIVE, CONTINUOUS LEAKAGE 11/19/2010   Disorder resulting from impaired renal function 11/13/2009   VARICOSE VEINS, LOWER EXTREMITIES  09/17/2009   BPH with urinary obstruction 09/17/2009   RESTLESS LEG SYNDROME 02/12/2009   HIP PAIN, RIGHT 02/12/2009   DIAPHRAGMATIC DISORDER 10/26/2008   Diabetes mellitus without complication (HCC) 01/08/2008   Hyperlipidemia 01/08/2008   Essential hypertension 01/08/2008   Coronary atherosclerosis 01/08/2008   Allergic rhinitis 01/08/2008   Obstructive sleep apnea 01/08/2008    PCP: Shirline Frees, NP   REFERRING PROVIDER: Shirline Frees, NP   REFERRING DIAG: Current: Balance and Falls risk Initial: M79.621 (ICD-10-CM) - Pain in right upper arm   THERAPY DIAG:  Unsteadiness on feet  Difficulty in walking, not elsewhere classified  Muscle weakness (generalized)  Rationale for Evaluation and Treatment: Rehabilitation  ONSET DATE: June 2024  SUBJECTIVE:  SUBJECTIVE STATEMENT: Patient reports that his dizziness has improved.  Patient reports dizziness has improved 60-65% since starting vestibular rehab.      Hand dominance: Right  PERTINENT HISTORY: CAD, DM, HTN, kidney issues  PAIN:  Are you having pain? Yes: NPRS scale: 0-2/10 Pain location: knees and shoulders Pain description: sharp Aggravating factors: IR, reaching OH, exercises Relieving factors: rest  PRECAUTIONS: None  RED FLAGS: None   WEIGHT BEARING RESTRICTIONS: No  FALLS:  Has patient fallen in last 6 months? No  LIVING ENVIRONMENT: Lives with: lives with their spouse Lives in: House/apartment Stairs: Yes: External: 1 steps; none Has following equipment at home: None  OCCUPATION: retired  PLOF: Independent  PATIENT GOALS: freedom of motion without pain  NEXT MD VISIT:   OBJECTIVE:  Note: Objective measures were completed at Evaluation unless otherwise noted.  DIAGNOSTIC FINDINGS:  IMPRESSION: 1.  Mild acromioclavicular degenerative change. 2. Mild subcortical cystic change in the lateral humeral head, nonspecific, but can be seen with rotator cuff pathology.  PATIENT SURVEYS:  Initial: FOTO 56 (goal 65)  12/03/23: goal was 65, score today was 77  Initial eval: 5x sit to stand: 16.61 seconds with min UE support  TUG deferred  12/03/23: 5x sit to stand: 9.67 seconds with min UE support  TUG: 9.44 sec  COGNITION: Overall cognitive status: Within functional limits for tasks assessed     SENSATION: WFL  POSTURE: Rounded shoulders, forward head  UPPER EXTREMITY ROM: * marked pain  A/P ROM Right eval Right 12/03/23 Left eval Left 12/03/23  Shoulder flexion 115/142* WFL 132 A/ROM [facility]  Shoulder extension      Shoulder abduction 108/120* WFL 108 A/ROM [facility]  Shoulder adduction      Shoulder internal rotation 56/70* WFL    Shoulder external rotation 54/55* WFL    Elbow flexion      Elbow extension      Wrist flexion      Wrist extension      Wrist ulnar deviation      Wrist radial deviation      Wrist pronation      Wrist supination      (Blank rows = not tested)  UPPER EXTREMITY MMT:  MMT Right eval Right 12/03/23 Left eval  Shoulder flexion 4- 4+   Shoulder extension 5 5   Shoulder abduction 4- 4+   Shoulder adduction     Shoulder internal rotation 4+ 5   Shoulder external rotation 4* 4+   Middle trapezius     Lower trapezius     Elbow flexion 4 4+   Elbow extension 5 5   Wrist flexion     Wrist extension 5 5   Wrist ulnar deviation     Wrist radial deviation     Wrist pronation     Wrist supination     Grip strength (lbs)     (Blank rows = not tested)  SHOULDER SPECIAL TESTS: Impingement tests: Neer impingement test: positive  and Hawkins/Kennedy impingement test: positive  Rotator cuff assessment: Empty can test: negative and Full can test: positive   JOINT MOBILITY TESTING:  Pain with AP mob, relief with PA  PALPATION:  Marked in SITS  tendons, IS muscle, LH biceps tendon   VESTIBULAR ASSESSMENT:  Orthostatic Hypotension: 12/21/2023: BP lying supine for 5 min:  128/55, HR 55 bpm BP After standing for 1 min: 137/63, HR 60 BP after standing for 3 min:  155/67, HR 65  12/21/2023:   Modified CTSIB: Condition  1- 30 sec, Condition 2- 30 sec, Condition 3- 30 sec, Condition 4- 30 sec.  Total- 120/120.  Some sway noted on condition 4 (eyes closed on foam pad). Seated ocular tracking:  normal/intact Saccades:  intact Head thrust:  slight nystagmus noted to each side  01/06/2024: Gilberto Better negative on right side Dix Hallpike slight positive on the left side *note, patient reports most dizziness with going from supine to sit and sit to stand    TODAY'S TREATMENT:                                                                                                                                         DATE:  01/13/2024: Nustep level 5 x6 min with PT present to discuss stats Seated pencil push-ups x20 Seated 1x1 VOR for horizontal 3x30 sec Standing on foam performing ball toss x1 min, then with feet together with ball toss x1 min Side steps with bouncing ball on wall down and back 2x10 ft Ambulation down hallway performing head nods and head rotations x2 laps each Kickstand stance performing trampoline rebounder with red plyoball x 1 min each foot leading Marching on trampoline performing horizontal head rotations x1 min (with UE support, as needed) Pencil push-ups standing on foam x10 Tandem walking on AirEx beam down and back x3 laps   01/11/24 Nustep x 5 min , level 5 (PT present to discuss status) Seated piriformis stretch x 30 sec each LE Lunges to BOSU x 10 eac LE fwd then lateral Lateral band walks with yellow loop x 5 laps at barre Single leg deadlift by counter with 5 lb kb  2 x 10 Hip matrix extension and abduction 2 x 10 each with 45 lbs SLS x 5 each LE attempting 10 sec Rocker board x 2 min Sit to stand with  10 lb kb x 10 Squat to chair with 10 lb kb x 10 Dead lift 2 x 10 with 10 lb kb (heavy vc's for correct technique) Static SLS to teach balance strategies: repeated approx 10 x each LE  01/06/2024: Dix Hallpike negative on right side Dix Hallpike slight positive on the left side and proceeded with Epley Maneuver for canalith repositioning x2 Standing on foam pad performing head rotations with UE support as needed and CGA 3x10 sec Ambulation down hallway performing head nods and head rotations x2 laps each Education on the 3 systems that feed into balance and how various factors can impair each (for example, diabetes)     PATIENT EDUCATION: Education details: PT eval findings, anticipated POC, initial HEP, and postural awareness  Person educated: Patient Education method: Explanation, Demonstration, and Handouts Education comprehension: verbalized understanding, returned demonstration, and verbal cues required  HOME EXERCISE PROGRAM: Access Code: [email or web address] URL: [email or web address] Date: 12/23/2023 Prepared by: Mikey Kirschner  Exercises - Seated Shoulder Flexion AAROM with Dowel  - 2 x daily - 7 x weekly -  1-2 sets - 10 reps - Seated Shoulder External Rotation AAROM with Cane and Hand in Neutral  - 2 x daily - 7 x weekly - 1-2 sets - 10 reps - Seated Shoulder Abduction Towel Slide at Table Top (Mirrored)  - 2 x daily - 7 x weekly - 1-2 sets - 10 reps - Seated Scapular Retraction  - 1 x daily - 7 x weekly - 1 sets - 10 reps - 2-3 sec hold - Supine Shoulder Flexion Extension AAROM with Dowel  - 1 x daily - 7 x weekly - 1 sets - 20 reps - Supine Shoulder Abduction AAROM with Dowel  - 1 x daily - 7 x weekly - 1 sets - 20 reps - Supine Shoulder External Rotation in 45 Degrees Abduction AAROM with Dowel  - 1 x daily - 7 x weekly - 1 sets - 20 reps - Supine Shoulder Flexion Extension Full Range AROM  - 1 x daily - 7 x weekly - 2 sets - 10 reps - Single Arm Serratus Punches in  Supine with Dumbbell  - 1 x daily - 7 x weekly - 2 sets - 10 reps - Sidelying Shoulder External Rotation  - 1 x daily - 7 x weekly - 2 sets - 10 reps - Standing Row with Anchored Resistance  - 1 x daily - 7 x weekly - 2 sets - 10 reps - Shoulder Extension with Resistance  - 1 x daily - 7 x weekly - 2 sets - 10 reps - Side Stepping with Resistance at Ankles and Counter Support  - 1 x daily - 7 x weekly - 3 sets - 10 reps - Standing Hip Extension with Counter Support  - 1 x daily - 7 x weekly - 3 sets - 10 reps - Heel Toe Raises with Counter Support  - 1 x daily - 7 x weekly - 3 sets - 10 reps - Single Leg Cone Touch  - 1 x daily - 7 x weekly - 3 sets - 10 reps - Pencil Pushups  - 1 x daily - 7 x weekly - 2 sets - 10 reps - Seated Gaze Stabilization with Head Rotation  - 1 x daily - 7 x weekly - 1-2 sets - 30 sec hold - Seated Gaze Stabilization with Head Nod  - 1 x daily - 7 x weekly - 1-2 sets - 30-60 sec hold - Walking with Head Nod  - 1 x daily - 7 x weekly - 2 sets - 10 reps - Walking with Head Rotation  - 1 x daily - 7 x weekly - 2 sets - 10 reps - Standing Hamstring Stretch on Chair  - 1 x daily - 7 x weekly - 1 sets - 3 reps - 30 sec hold - Quadricep Stretch with Chair and Counter Support  - 1 x daily - 7 x weekly - 1 sets - 3 reps - 30 sec hold - Seated Figure 4 Piriformis Stretch  - 1 x daily - 7 x weekly - 1 sets - 3 reps - 30 sec hold   ASSESSMENT:  CLINICAL IMPRESSION: Mr Bishop presents to skilled PT reporting that his dizziness is improving.  Patient states that he is progressing and the horizontal head rotations still seem to make him more dizzy and he is doing well with the pencil push-ups.  Patient continues to progress with vestibular rehabilitation and it able to progress with overall, less dizziness and less support required.  Patient  continues to require skilled PT to progress towards goal related activities and decreased fall risk.  OBJECTIVE IMPAIRMENTS: decreased  activity tolerance, decreased ROM, decreased strength, increased muscle spasms, impaired flexibility, impaired UE functional use, postural dysfunction, and pain.   ACTIVITY LIMITATIONS: bathing, dressing, reach over head, and hygiene/grooming  PARTICIPATION LIMITATIONS:  anything with OH activity   PERSONAL FACTORS: Age, Fitness, Time since onset of injury/illness/exacerbation, and 3+ comorbidities: DM, CKD, CAD  are also affecting patient's functional outcome.   REHAB POTENTIAL: Good  CLINICAL DECISION MAKING: Evolving/moderate complexity  EVALUATION COMPLEXITY: Moderate   GOALS: Goals reviewed with patient? Yes  SHORT TERM GOALS: Target date: 11/09/2023   Patient will be independent with initial HEP.  Baseline: 10/26/23 Goal status: MET  2.  Decreased shoulder pain with ADLs by 25%  Baseline: reduced pain today (10/26/23) Goal status:MET 11/16/23   3.  Balance assessed for fall risk Baseline: issued sit to stand for HEP Goal status: MET  Target date  : 01/11/2024  4. Patient will report 50% improved confidence in overall balance Baseline:  Goal status: Met on 01/13/2024  LONG TERM GOALS: Target date: 12/07/2023   Patient will be independent with advanced/ongoing HEP to improve outcomes and carryover.  Baseline:  Goal status: MET 11/30/23  2.  Patient will report 75% improvement in R shoulder pain to improve QOL.  Baseline: 85% (11/25/23) Goal status: MET  3.  Patient to improve R shoulder AROM to [facility] without pain provocation to allow for increased ease of ADLs.  Baseline:  Goal status: MET 11/30/23  4.  Patient will demonstrate improved UE strength to 4+/5.  Baseline:  Goal status: MET 11/30/23  5.  Patient will report 52 on FOTO to demonstrate improved functional ability.  Baseline:  Goal status: MET 12/03/23  6.  Patient will be able to reach behind his back to dress without difficulty.   Baseline:  Goal status: MET 12/03/23  Target Date: 02/08/2024  7. Patient  to report no falls or near falls over the course of the episode we are treating for balance and falls.   Baseline:  Goal status: Ongoing  8. Patient will be cleared of having vertigo symptoms Baseline:  Goal status: Ongoing  9. Patient will score 38 on Berg balance scale Baseline:  Goal status: INITIAL  10. Patient will improve on functional tests: (TUG and 5x sit to stand) by 1-2 seconds Baseline:  Goal status: INITIAL  PLAN:  PT FREQUENCY: 2x/week  PT DURATION: 8 weeks  PLANNED INTERVENTIONS: 97164- PT Re-evaluation, 97110-Therapeutic exercises, 97530- Therapeutic activity, 97112- Neuromuscular re-education, 97535- Self Care, 16109- Manual therapy, [phone]- Gait training, [phone]- Canalith repositioning, [id number]- Aquatic Therapy, 97014- Electrical stimulation (unattended), 97016- Vasopneumatic device, [id number]- Ultrasound, [id number]- Ionotophoresis 4mg /ml Dexamethasone, Patient/Family education, Balance training, Stair training, Taping, Dry Needling, Joint mobilization, Spinal mobilization, Vestibular training, Visual/preceptual remediation/compensation, Cryotherapy, and Moist heat  PLAN FOR NEXT SESSION: Nustep, Balance and gait training, fall prevention and LE weakness, vestibular rehab   Reather Laurence, PT, DPT 01/13/24, 11:51 AM  [facility] [address], [address] [place], [place] [address] Phone # [phone] Fax [phone]

## 2024-01-18 ENCOUNTER — Ambulatory Visit: Payer: Medicare Other

## 2024-01-18 DIAGNOSIS — R2681 Unsteadiness on feet: Secondary | ICD-10-CM | POA: Diagnosis not present

## 2024-01-18 DIAGNOSIS — R293 Abnormal posture: Secondary | ICD-10-CM

## 2024-01-18 DIAGNOSIS — R262 Difficulty in walking, not elsewhere classified: Secondary | ICD-10-CM

## 2024-01-18 DIAGNOSIS — R252 Cramp and spasm: Secondary | ICD-10-CM

## 2024-01-18 DIAGNOSIS — M6281 Muscle weakness (generalized): Secondary | ICD-10-CM

## 2024-01-18 NOTE — Therapy (Signed)
 OUTPATIENT PHYSICAL THERAPY BALANCE TREATMENT  Patient Name: Ian [name] MRN: [id number] DOB:January 29, 1941, 83 y.o., male Today's Date: 01/18/2024  END OF SESSION:  PT End of Session - 01/18/24 1155     Visit Number 18    Date for PT Re-Evaluation 01/31/24    Authorization Type MCR    Progress Note Due on Visit 20    PT Start Time 1145    PT Stop Time 1228    PT Time Calculation (min) 43 min    Activity Tolerance Patient tolerated treatment well    Behavior During Therapy [facility] for tasks assessed/performed                Past Medical History:  Diagnosis Date   Allergy    Apnea, sleep 07/28/07   NPSG [place], AHI 79.9   Carotid artery stenosis    mild 04/2010, 05/2012   Coronary heart disease 2008   Cath 2008, 60-70% proximal diagonal 1 stenosis,  RCA 40% stenosis.  nonischemic Lexiscan 03/2011   Diabetes mellitus    type II   GERD (gastroesophageal reflux disease)    Hyperlipidemia    Hypertension    Hypospadias    Past Surgical History:  Procedure Laterality Date   EYE SURGERY     NASAL SINUS SURGERY     NISSEN FUNDOPLICATION     TONSILLECTOMY     VEIN LIGATION AND STRIPPING     Patient Active Problem List   Diagnosis Date Noted   Dizziness 09/25/2020   CKD (chronic kidney disease), stage II 09/25/2020   Type 2 diabetes mellitus with complication, without long-term current use of insulin (HCC) 09/21/2019   Coronary artery disease involving native coronary artery of native heart without angina pectoris 03/23/2017   Bilateral carotid artery stenosis 03/23/2017   Dyslipidemia 03/23/2017   IBS (irritable bowel syndrome) 12/12/2014   Erectile dysfunction 01/19/2014   Diabetic polyneuropathy (HCC) 11/20/2013   Shortness of breath 09/26/2012   Chronic insomnia 03/25/2012   PALPITATIONS 11/19/2010   URINARY INCONTINENCE, PASSIVE, CONTINUOUS LEAKAGE 11/19/2010   Disorder resulting from impaired renal function 11/13/2009   VARICOSE VEINS, LOWER EXTREMITIES  09/17/2009   BPH with urinary obstruction 09/17/2009   RESTLESS LEG SYNDROME 02/12/2009   HIP PAIN, RIGHT 02/12/2009   DIAPHRAGMATIC DISORDER 10/26/2008   Diabetes mellitus without complication (HCC) 01/08/2008   Hyperlipidemia 01/08/2008   Essential hypertension 01/08/2008   Coronary atherosclerosis 01/08/2008   Allergic rhinitis 01/08/2008   Obstructive sleep apnea 01/08/2008    PCP: Shirline Frees, NP   REFERRING PROVIDER: Shirline Frees, NP   REFERRING DIAG: Current: Balance and Falls risk Initial: M79.621 (ICD-10-CM) - Pain in right upper arm   THERAPY DIAG:  Unsteadiness on feet  Difficulty in walking, not elsewhere classified  Muscle weakness (generalized)  Cramp and spasm  Abnormal posture  Rationale for Evaluation and Treatment: Rehabilitation  ONSET DATE: June 2024  SUBJECTIVE:  SUBJECTIVE STATEMENT: Patient reports he had some dizziness this morning from bending down in the shower to clean some mold from around the bottom of the shower curtain/door.       Hand dominance: Right  PERTINENT HISTORY: CAD, DM, HTN, kidney issues  PAIN:  Are you having pain? Yes: NPRS scale: 0-2/10 Pain location: knees and shoulders Pain description: sharp Aggravating factors: IR, reaching OH, exercises Relieving factors: rest  PRECAUTIONS: None  RED FLAGS: None   WEIGHT BEARING RESTRICTIONS: No  FALLS:  Has patient fallen in last 6 months? No  LIVING ENVIRONMENT: Lives with: lives with their spouse Lives in: House/apartment Stairs: Yes: External: 1 steps; none Has following equipment at home: None  OCCUPATION: retired  PLOF: Independent  PATIENT GOALS: freedom of motion without pain  NEXT MD VISIT:   OBJECTIVE:  Note: Objective measures were completed at Evaluation  unless otherwise noted.  DIAGNOSTIC FINDINGS:  IMPRESSION: 1. Mild acromioclavicular degenerative change. 2. Mild subcortical cystic change in the lateral humeral head, nonspecific, but can be seen with rotator cuff pathology.  PATIENT SURVEYS:  Initial: FOTO 56 (goal 65)  12/03/23: goal was 65, score today was 77  Initial eval: 5x sit to stand: 16.61 seconds with min UE support  TUG deferred  12/03/23: 5x sit to stand: 9.67 seconds with min UE support  TUG: 9.44 sec  COGNITION: Overall cognitive status: Within functional limits for tasks assessed     SENSATION: WFL  POSTURE: Rounded shoulders, forward head  UPPER EXTREMITY ROM: * marked pain  A/P ROM Right eval Right 12/03/23 Left eval Left 12/03/23  Shoulder flexion 115/142* WFL 132 A/ROM [facility]  Shoulder extension      Shoulder abduction 108/120* WFL 108 A/ROM [facility]  Shoulder adduction      Shoulder internal rotation 56/70* WFL    Shoulder external rotation 54/55* WFL    Elbow flexion      Elbow extension      Wrist flexion      Wrist extension      Wrist ulnar deviation      Wrist radial deviation      Wrist pronation      Wrist supination      (Blank rows = not tested)  UPPER EXTREMITY MMT:  MMT Right eval Right 12/03/23 Left eval  Shoulder flexion 4- 4+   Shoulder extension 5 5   Shoulder abduction 4- 4+   Shoulder adduction     Shoulder internal rotation 4+ 5   Shoulder external rotation 4* 4+   Middle trapezius     Lower trapezius     Elbow flexion 4 4+   Elbow extension 5 5   Wrist flexion     Wrist extension 5 5   Wrist ulnar deviation     Wrist radial deviation     Wrist pronation     Wrist supination     Grip strength (lbs)     (Blank rows = not tested)  SHOULDER SPECIAL TESTS: Impingement tests: Neer impingement test: positive  and Hawkins/Kennedy impingement test: positive  Rotator cuff assessment: Empty can test: negative and Full can test: positive   JOINT MOBILITY TESTING:   Pain with AP mob, relief with PA  PALPATION:  Marked in SITS tendons, IS muscle, LH biceps tendon   VESTIBULAR ASSESSMENT:  Orthostatic Hypotension: 12/21/2023: BP lying supine for 5 min:  128/55, HR 55 bpm BP After standing for 1 min: 137/63, HR 60 BP after standing for 3 min:  155/67,  HR 65  12/21/2023:   Modified CTSIB: Condition 1- 30 sec, Condition 2- 30 sec, Condition 3- 30 sec, Condition 4- 30 sec.  Total- 120/120.  Some sway noted on condition 4 (eyes closed on foam pad). Seated ocular tracking:  normal/intact Saccades:  intact Head thrust:  slight nystagmus noted to each side  01/06/2024: Gilberto Better negative on right side Dix Hallpike slight positive on the left side *note, patient reports most dizziness with going from supine to sit and sit to stand    TODAY'S TREATMENT:                                                                                                                                         DATE:  01/18/24 Nustep x 5 min , level 5 (PT present to discuss status) In // bars: long balance beam fwd  and backward walking x 5 laps In // bars: long balance beam lateral walking x 5 laps In // bars: lateral band walks with red tband x 5 laps Lateral band walks with red tband tied x 5 laps in // bars Marching on balance pad x 20 (attempting no UE support) in // bars Step up and hold on balance pad x 10 each LE fwd then x 10 each LE lateral in // bars Reverse lunges using slider x 10 Side lunges using slider x 10 each LE Sit to stand with 2 x 10  01/13/2024: Nustep level 5 x6 min with PT present to discuss stats Seated pencil push-ups x20 Seated 1x1 VOR for horizontal 3x30 sec Standing on foam performing ball toss x1 min, then with feet together with ball toss x1 min Side steps with bouncing ball on wall down and back 2x10 ft Ambulation down hallway performing head nods and head rotations x2 laps each Kickstand stance performing trampoline rebounder with red  plyoball x 1 min each foot leading Marching on trampoline performing horizontal head rotations x1 min (with UE support, as needed) Pencil push-ups standing on foam x10 Tandem walking on AirEx beam down and back x3 laps  01/11/24 Nustep x 5 min , level 5 (PT present to discuss status) Seated piriformis stretch x 30 sec each LE Lunges to BOSU x 10 eac LE fwd then lateral Lateral band walks with yellow loop x 5 laps at barre Single leg deadlift by counter with 5 lb kb  2 x 10 Hip matrix extension and abduction 2 x 10 each with 45 lbs SLS x 5 each LE attempting 10 sec Rocker board x 2 min Sit to stand with 10 lb kb x 10 Squat to chair with 10 lb kb x 10 Dead lift 2 x 10 with 10 lb kb (heavy vc's for correct technique) Static SLS to teach balance strategies: repeated approx 10 x each LE  01/06/2024: Dix Hallpike negative on right side Dix Hallpike slight positive on the left side and  proceeded with Epley Maneuver for canalith repositioning x2 Standing on foam pad performing head rotations with UE support as needed and CGA 3x10 sec Ambulation down hallway performing head nods and head rotations x2 laps each Education on the 3 systems that feed into balance and how various factors can impair each (for example, diabetes)     PATIENT EDUCATION: Education details: PT eval findings, anticipated POC, initial HEP, and postural awareness  Person educated: Patient Education method: Explanation, Demonstration, and Handouts Education comprehension: verbalized understanding, returned demonstration, and verbal cues required  HOME EXERCISE PROGRAM: Access Code: [email or web address] URL: [email or web address] Date: 12/23/2023 Prepared by: Mikey Kirschner  Exercises - Seated Shoulder Flexion AAROM with Dowel  - 2 x daily - 7 x weekly - 1-2 sets - 10 reps - Seated Shoulder External Rotation AAROM with Cane and Hand in Neutral  - 2 x daily - 7 x weekly - 1-2 sets - 10 reps - Seated Shoulder  Abduction Towel Slide at Table Top (Mirrored)  - 2 x daily - 7 x weekly - 1-2 sets - 10 reps - Seated Scapular Retraction  - 1 x daily - 7 x weekly - 1 sets - 10 reps - 2-3 sec hold - Supine Shoulder Flexion Extension AAROM with Dowel  - 1 x daily - 7 x weekly - 1 sets - 20 reps - Supine Shoulder Abduction AAROM with Dowel  - 1 x daily - 7 x weekly - 1 sets - 20 reps - Supine Shoulder External Rotation in 45 Degrees Abduction AAROM with Dowel  - 1 x daily - 7 x weekly - 1 sets - 20 reps - Supine Shoulder Flexion Extension Full Range AROM  - 1 x daily - 7 x weekly - 2 sets - 10 reps - Single Arm Serratus Punches in Supine with Dumbbell  - 1 x daily - 7 x weekly - 2 sets - 10 reps - Sidelying Shoulder External Rotation  - 1 x daily - 7 x weekly - 2 sets - 10 reps - Standing Row with Anchored Resistance  - 1 x daily - 7 x weekly - 2 sets - 10 reps - Shoulder Extension with Resistance  - 1 x daily - 7 x weekly - 2 sets - 10 reps - Side Stepping with Resistance at Ankles and Counter Support  - 1 x daily - 7 x weekly - 3 sets - 10 reps - Standing Hip Extension with Counter Support  - 1 x daily - 7 x weekly - 3 sets - 10 reps - Heel Toe Raises with Counter Support  - 1 x daily - 7 x weekly - 3 sets - 10 reps - Single Leg Cone Touch  - 1 x daily - 7 x weekly - 3 sets - 10 reps - Pencil Pushups  - 1 x daily - 7 x weekly - 2 sets - 10 reps - Seated Gaze Stabilization with Head Rotation  - 1 x daily - 7 x weekly - 1-2 sets - 30 sec hold - Seated Gaze Stabilization with Head Nod  - 1 x daily - 7 x weekly - 1-2 sets - 30-60 sec hold - Walking with Head Nod  - 1 x daily - 7 x weekly - 2 sets - 10 reps - Walking with Head Rotation  - 1 x daily - 7 x weekly - 2 sets - 10 reps - Standing Hamstring Stretch on Chair  - 1 x daily - 7 x weekly - 1 sets -  3 reps - 30 sec hold - Theatre manager with Chair and Counter Support  - 1 x daily - 7 x weekly - 1 sets - 3 reps - 30 sec hold - Seated Figure 4 Piriformis  Stretch  - 1 x daily - 7 x weekly - 1 sets - 3 reps - 30 sec hold   ASSESSMENT:  CLINICAL IMPRESSION: Sundeep demonstrates much improved hip strength and consistent use of hip strategies.  He continues to have some dizziness but completes all tasks.  He is well motivated and compliant.  Patient continues to require skilled PT to progress towards goal related activities and decreased fall risk.  OBJECTIVE IMPAIRMENTS: decreased activity tolerance, decreased ROM, decreased strength, increased muscle spasms, impaired flexibility, impaired UE functional use, postural dysfunction, and pain.   ACTIVITY LIMITATIONS: bathing, dressing, reach over head, and hygiene/grooming  PARTICIPATION LIMITATIONS:  anything with OH activity   PERSONAL FACTORS: Age, Fitness, Time since onset of injury/illness/exacerbation, and 3+ comorbidities: DM, CKD, CAD  are also affecting patient's functional outcome.   REHAB POTENTIAL: Good  CLINICAL DECISION MAKING: Evolving/moderate complexity  EVALUATION COMPLEXITY: Moderate   GOALS: Goals reviewed with patient? Yes  SHORT TERM GOALS: Target date: 11/09/2023   Patient will be independent with initial HEP.  Baseline: 10/26/23 Goal status: MET  2.  Decreased shoulder pain with ADLs by 25%  Baseline: reduced pain today (10/26/23) Goal status:MET 11/16/23   3.  Balance assessed for fall risk Baseline: issued sit to stand for HEP Goal status: MET  Target date  : 01/11/2024  4. Patient will report 50% improved confidence in overall balance Baseline:  Goal status: Met on 01/13/2024  LONG TERM GOALS: Target date: 12/07/2023   Patient will be independent with advanced/ongoing HEP to improve outcomes and carryover.  Baseline:  Goal status: MET 11/30/23  2.  Patient will report 75% improvement in R shoulder pain to improve QOL.  Baseline: 85% (11/25/23) Goal status: MET  3.  Patient to improve R shoulder AROM to [facility] without pain provocation to allow for increased  ease of ADLs.  Baseline:  Goal status: MET 11/30/23  4.  Patient will demonstrate improved UE strength to 4+/5.  Baseline:  Goal status: MET 11/30/23  5.  Patient will report 60 on FOTO to demonstrate improved functional ability.  Baseline:  Goal status: MET 12/03/23  6.  Patient will be able to reach behind his back to dress without difficulty.   Baseline:  Goal status: MET 12/03/23  Target Date: 02/08/2024  7. Patient to report no falls or near falls over the course of the episode we are treating for balance and falls.   Baseline:  Goal status: Ongoing  8. Patient will be cleared of having vertigo symptoms Baseline:  Goal status: Ongoing  9. Patient will score 38 on Berg balance scale Baseline:  Goal status: INITIAL  10. Patient will improve on functional tests: (TUG and 5x sit to stand) by 1-2 seconds Baseline:  Goal status: INITIAL  PLAN:  PT FREQUENCY: 2x/week  PT DURATION: 8 weeks  PLANNED INTERVENTIONS: 97164- PT Re-evaluation, 97110-Therapeutic exercises, 97530- Therapeutic activity, [id number]- Neuromuscular re-education, 97535- Self Care, 16109- Manual therapy, [id number]- Gait training, [phone]- Canalith repositioning, [id number]- Aquatic Therapy, 97014- Electrical stimulation (unattended), 97016- Vasopneumatic device, [id number]- Ultrasound, 09811- Ionotophoresis 4mg /ml Dexamethasone, Patient/Family education, Balance training, Stair training, Taping, Dry Needling, Joint mobilization, Spinal mobilization, Vestibular training, Visual/preceptual remediation/compensation, Cryotherapy, and Moist heat  PLAN FOR NEXT SESSION: Nustep, Balance and gait training, fall prevention and  LE weakness, vestibular rehab   Rayjon Wery B. Breonna Gafford, PT 01/18/24 12:31 PM [facility] [address], [address] [place], [place] [address] Phone # [phone] Fax [phone]

## 2024-01-20 ENCOUNTER — Encounter: Payer: Self-pay | Admitting: Rehabilitative and Restorative Service Providers"

## 2024-01-20 ENCOUNTER — Ambulatory Visit: Payer: Medicare Other | Admitting: Rehabilitative and Restorative Service Providers"

## 2024-01-20 DIAGNOSIS — R2681 Unsteadiness on feet: Secondary | ICD-10-CM

## 2024-01-20 DIAGNOSIS — M6281 Muscle weakness (generalized): Secondary | ICD-10-CM | POA: Diagnosis not present

## 2024-01-20 DIAGNOSIS — R293 Abnormal posture: Secondary | ICD-10-CM | POA: Diagnosis not present

## 2024-01-20 DIAGNOSIS — R262 Difficulty in walking, not elsewhere classified: Secondary | ICD-10-CM

## 2024-01-20 DIAGNOSIS — R252 Cramp and spasm: Secondary | ICD-10-CM | POA: Diagnosis not present

## 2024-01-20 NOTE — Therapy (Signed)
 OUTPATIENT PHYSICAL THERAPY BALANCE TREATMENT  Patient Name: Ian [name] MRN: [id number] DOB:January 08, 1941, 83 y.o., male Today's Date: 01/20/2024  END OF SESSION:  PT End of Session - 01/20/24 1101     Visit Number 19    Date for PT Re-Evaluation 01/31/24    Authorization Type MCR    Progress Note Due on Visit 20    PT Start Time 1058    PT Stop Time 1140    PT Time Calculation (min) 42 min    Activity Tolerance Patient tolerated treatment well    Behavior During Therapy [facility] for tasks assessed/performed                Past Medical History:  Diagnosis Date   Allergy    Apnea, sleep 07/28/07   NPSG [place], AHI 79.9   Carotid artery stenosis    mild 04/2010, 05/2012   Coronary heart disease 2008   Cath 2008, 60-70% proximal diagonal 1 stenosis,  RCA 40% stenosis.  nonischemic Lexiscan 03/2011   Diabetes mellitus    type II   GERD (gastroesophageal reflux disease)    Hyperlipidemia    Hypertension    Hypospadias    Past Surgical History:  Procedure Laterality Date   EYE SURGERY     NASAL SINUS SURGERY     NISSEN FUNDOPLICATION     TONSILLECTOMY     VEIN LIGATION AND STRIPPING     Patient Active Problem List   Diagnosis Date Noted   Dizziness 09/25/2020   CKD (chronic kidney disease), stage II 09/25/2020   Type 2 diabetes mellitus with complication, without long-term current use of insulin (HCC) 09/21/2019   Coronary artery disease involving native coronary artery of native heart without angina pectoris 03/23/2017   Bilateral carotid artery stenosis 03/23/2017   Dyslipidemia 03/23/2017   IBS (irritable bowel syndrome) 12/12/2014   Erectile dysfunction 01/19/2014   Diabetic polyneuropathy (HCC) 11/20/2013   Shortness of breath 09/26/2012   Chronic insomnia 03/25/2012   PALPITATIONS 11/19/2010   URINARY INCONTINENCE, PASSIVE, CONTINUOUS LEAKAGE 11/19/2010   Disorder resulting from impaired renal function 11/13/2009   VARICOSE VEINS, LOWER EXTREMITIES  09/17/2009   BPH with urinary obstruction 09/17/2009   RESTLESS LEG SYNDROME 02/12/2009   HIP PAIN, RIGHT 02/12/2009   DIAPHRAGMATIC DISORDER 10/26/2008   Diabetes mellitus without complication (HCC) 01/08/2008   Hyperlipidemia 01/08/2008   Essential hypertension 01/08/2008   Coronary atherosclerosis 01/08/2008   Allergic rhinitis 01/08/2008   Obstructive sleep apnea 01/08/2008    PCP: Shirline Frees, NP   REFERRING PROVIDER: Shirline Frees, NP   REFERRING DIAG: Current: Balance and Falls risk Initial: M79.621 (ICD-10-CM) - Pain in right upper arm   THERAPY DIAG:  Unsteadiness on feet  Difficulty in walking, not elsewhere classified  Muscle weakness (generalized)  Rationale for Evaluation and Treatment: Rehabilitation  ONSET DATE: June 2024  SUBJECTIVE:  SUBJECTIVE STATEMENT: Patient reports he had some dizziness this morning from bending down in the shower to clean some mold from around the bottom of the shower curtain/door.       Hand dominance: Right  PERTINENT HISTORY: CAD, DM, HTN, kidney issues  PAIN:  Are you having pain? Yes: NPRS scale: 0-2/10 Pain location: knees and shoulders Pain description: sharp Aggravating factors: IR, reaching OH, exercises Relieving factors: rest  PRECAUTIONS: None  RED FLAGS: None   WEIGHT BEARING RESTRICTIONS: No  FALLS:  Has patient fallen in last 6 months? No  LIVING ENVIRONMENT: Lives with: lives with their spouse Lives in: House/apartment Stairs: Yes: External: 1 steps; none Has following equipment at home: None  OCCUPATION: retired  PLOF: Independent  PATIENT GOALS: freedom of motion without pain  NEXT MD VISIT:   OBJECTIVE:  Note: Objective measures were completed at Evaluation unless otherwise noted.  DIAGNOSTIC  FINDINGS:  IMPRESSION: 1. Mild acromioclavicular degenerative change. 2. Mild subcortical cystic change in the lateral humeral head, nonspecific, but can be seen with rotator cuff pathology.  PATIENT SURVEYS:  Initial: FOTO 56 (goal 65)  12/03/23: goal was 65, score today was 77  Initial eval: 5x sit to stand: 16.61 seconds with min UE support  TUG deferred  12/03/23: 5x sit to stand: 9.67 seconds with min UE support  TUG: 9.44 sec  COGNITION: Overall cognitive status: Within functional limits for tasks assessed     SENSATION: WFL  POSTURE: Rounded shoulders, forward head  UPPER EXTREMITY ROM: * marked pain  A/P ROM Right eval Right 12/03/23 Left eval Left 12/03/23  Shoulder flexion 115/142* WFL 132 A/ROM [facility]  Shoulder extension      Shoulder abduction 108/120* WFL 108 A/ROM [facility]  Shoulder adduction      Shoulder internal rotation 56/70* WFL    Shoulder external rotation 54/55* WFL    Elbow flexion      Elbow extension      Wrist flexion      Wrist extension      Wrist ulnar deviation      Wrist radial deviation      Wrist pronation      Wrist supination      (Blank rows = not tested)  UPPER EXTREMITY MMT:  MMT Right eval Right 12/03/23 Left eval  Shoulder flexion 4- 4+   Shoulder extension 5 5   Shoulder abduction 4- 4+   Shoulder adduction     Shoulder internal rotation 4+ 5   Shoulder external rotation 4* 4+   Middle trapezius     Lower trapezius     Elbow flexion 4 4+   Elbow extension 5 5   Wrist flexion     Wrist extension 5 5   Wrist ulnar deviation     Wrist radial deviation     Wrist pronation     Wrist supination     Grip strength (lbs)     (Blank rows = not tested)  SHOULDER SPECIAL TESTS: Impingement tests: Neer impingement test: positive  and Hawkins/Kennedy impingement test: positive  Rotator cuff assessment: Empty can test: negative and Full can test: positive   JOINT MOBILITY TESTING:  Pain with AP mob, relief with  PA  PALPATION:  Marked in SITS tendons, IS muscle, LH biceps tendon   VESTIBULAR ASSESSMENT:  Orthostatic Hypotension: 12/21/2023: BP lying supine for 5 min:  128/55, HR 55 bpm BP After standing for 1 min: 137/63, HR 60 BP after standing for 3 min:  155/67,  HR 65  12/21/2023:   Modified CTSIB: Condition 1- 30 sec, Condition 2- 30 sec, Condition 3- 30 sec, Condition 4- 30 sec.  Total- 120/120.  Some sway noted on condition 4 (eyes closed on foam pad). Seated ocular tracking:  normal/intact Saccades:  intact Head thrust:  slight nystagmus noted to each side  01/06/2024: Gilberto Better negative on right side Dix Hallpike slight positive on the left side *note, patient reports most dizziness with going from supine to sit and sit to stand    TODAY'S TREATMENT:                                                                                                                                         DATE:  01/20/2024 Nustep level 5 x6 min with PT present to discuss status Dix Hallpike negative on left. Dix Hallpike positive on the right.  Proceeded to Epley Maneuver for canalith repositioning x2. Tandem walking on AirEx beam down and back x3 laps Side stepping on AirEx beam down and back x3 laps Standing on rocker board with board placed in both positions x1 min each   01/18/24 Nustep x 5 min , level 5 (PT present to discuss status) In // bars: long balance beam fwd  and backward walking x 5 laps In // bars: long balance beam lateral walking x 5 laps In // bars: lateral band walks with red tband x 5 laps Lateral band walks with red tband tied x 5 laps in // bars Marching on balance pad x 20 (attempting no UE support) in // bars Step up and hold on balance pad x 10 each LE fwd then x 10 each LE lateral in // bars Reverse lunges using slider x 10 Side lunges using slider x 10 each LE Sit to stand with 2 x 10  01/13/2024: Nustep level 5 x6 min with PT present to discuss stats Seated  pencil push-ups x20 Seated 1x1 VOR for horizontal 3x30 sec Standing on foam performing ball toss x1 min, then with feet together with ball toss x1 min Side steps with bouncing ball on wall down and back 2x10 ft Ambulation down hallway performing head nods and head rotations x2 laps each Kickstand stance performing trampoline rebounder with red plyoball x 1 min each foot leading Marching on trampoline performing horizontal head rotations x1 min (with UE support, as needed) Pencil push-ups standing on foam x10 Tandem walking on AirEx beam down and back x3 laps     PATIENT EDUCATION: Education details: PT eval findings, anticipated POC, initial HEP, and postural awareness  Person educated: Patient Education method: Explanation, Demonstration, and Handouts Education comprehension: verbalized understanding, returned demonstration, and verbal cues required  HOME EXERCISE PROGRAM: Access Code: [email or web address] URL: [email or web address] Date: 12/23/2023 Prepared by: Mikey Kirschner  Exercises - Seated Shoulder Flexion AAROM with Dowel  - 2 x daily - 7 x weekly - 1-2 sets -  10 reps - Seated Shoulder External Rotation AAROM with Cane and Hand in Neutral  - 2 x daily - 7 x weekly - 1-2 sets - 10 reps - Seated Shoulder Abduction Towel Slide at Table Top (Mirrored)  - 2 x daily - 7 x weekly - 1-2 sets - 10 reps - Seated Scapular Retraction  - 1 x daily - 7 x weekly - 1 sets - 10 reps - 2-3 sec hold - Supine Shoulder Flexion Extension AAROM with Dowel  - 1 x daily - 7 x weekly - 1 sets - 20 reps - Supine Shoulder Abduction AAROM with Dowel  - 1 x daily - 7 x weekly - 1 sets - 20 reps - Supine Shoulder External Rotation in 45 Degrees Abduction AAROM with Dowel  - 1 x daily - 7 x weekly - 1 sets - 20 reps - Supine Shoulder Flexion Extension Full Range AROM  - 1 x daily - 7 x weekly - 2 sets - 10 reps - Single Arm Serratus Punches in Supine with Dumbbell  - 1 x daily - 7 x weekly - 2 sets -  10 reps - Sidelying Shoulder External Rotation  - 1 x daily - 7 x weekly - 2 sets - 10 reps - Standing Row with Anchored Resistance  - 1 x daily - 7 x weekly - 2 sets - 10 reps - Shoulder Extension with Resistance  - 1 x daily - 7 x weekly - 2 sets - 10 reps - Side Stepping with Resistance at Ankles and Counter Support  - 1 x daily - 7 x weekly - 3 sets - 10 reps - Standing Hip Extension with Counter Support  - 1 x daily - 7 x weekly - 3 sets - 10 reps - Heel Toe Raises with Counter Support  - 1 x daily - 7 x weekly - 3 sets - 10 reps - Single Leg Cone Touch  - 1 x daily - 7 x weekly - 3 sets - 10 reps - Pencil Pushups  - 1 x daily - 7 x weekly - 2 sets - 10 reps - Seated Gaze Stabilization with Head Rotation  - 1 x daily - 7 x weekly - 1-2 sets - 30 sec hold - Seated Gaze Stabilization with Head Nod  - 1 x daily - 7 x weekly - 1-2 sets - 30-60 sec hold - Walking with Head Nod  - 1 x daily - 7 x weekly - 2 sets - 10 reps - Walking with Head Rotation  - 1 x daily - 7 x weekly - 2 sets - 10 reps - Standing Hamstring Stretch on Chair  - 1 x daily - 7 x weekly - 1 sets - 3 reps - 30 sec hold - Quadricep Stretch with Chair and Counter Support  - 1 x daily - 7 x weekly - 1 sets - 3 reps - 30 sec hold - Seated Figure 4 Piriformis Stretch  - 1 x daily - 7 x weekly - 1 sets - 3 reps - 30 sec hold   ASSESSMENT:  CLINICAL IMPRESSION: Mr Spahr presents to skilled PT reporting that he is having increased dizziness this morning.  Patient with negative Gilberto Better on the left, but did have positive on the right, so proceeded with canalith repositioning using the Epley Maneuver.  Patient did have fewer symptoms on 2nd time completing the Epley Maneuver.  Patient able to perform balance exercises after canalith repositioning.  Patient continues  to require skilled PT to progress towards goal related activities.  OBJECTIVE IMPAIRMENTS: decreased activity tolerance, decreased ROM, decreased strength, increased  muscle spasms, impaired flexibility, impaired UE functional use, postural dysfunction, and pain.   ACTIVITY LIMITATIONS: bathing, dressing, reach over head, and hygiene/grooming  PARTICIPATION LIMITATIONS:  anything with OH activity   PERSONAL FACTORS: Age, Fitness, Time since onset of injury/illness/exacerbation, and 3+ comorbidities: DM, CKD, CAD  are also affecting patient's functional outcome.   REHAB POTENTIAL: Good  CLINICAL DECISION MAKING: Evolving/moderate complexity  EVALUATION COMPLEXITY: Moderate   GOALS: Goals reviewed with patient? Yes  SHORT TERM GOALS: Target date: 11/09/2023   Patient will be independent with initial HEP.  Baseline: 10/26/23 Goal status: MET  2.  Decreased shoulder pain with ADLs by 25%  Baseline: reduced pain today (10/26/23) Goal status:MET 11/16/23   3.  Balance assessed for fall risk Baseline: issued sit to stand for HEP Goal status: MET  Target date  : 01/11/2024  4. Patient will report 50% improved confidence in overall balance Baseline:  Goal status: Met on 01/13/2024  LONG TERM GOALS: Target date: 12/07/2023   Patient will be independent with advanced/ongoing HEP to improve outcomes and carryover.  Baseline:  Goal status: MET 11/30/23  2.  Patient will report 75% improvement in R shoulder pain to improve QOL.  Baseline: 85% (11/25/23) Goal status: MET  3.  Patient to improve R shoulder AROM to [facility] without pain provocation to allow for increased ease of ADLs.  Baseline:  Goal status: MET 11/30/23  4.  Patient will demonstrate improved UE strength to 4+/5.  Baseline:  Goal status: MET 11/30/23  5.  Patient will report 44 on FOTO to demonstrate improved functional ability.  Baseline:  Goal status: MET 12/03/23  6.  Patient will be able to reach behind his back to dress without difficulty.   Baseline:  Goal status: MET 12/03/23  Target Date: 02/08/2024  7. Patient to report no falls or near falls over the course of the episode  we are treating for balance and falls.   Baseline:  Goal status: Ongoing  8. Patient will be cleared of having vertigo symptoms Baseline:  Goal status: Ongoing  9. Patient will score 38 on Berg balance scale Baseline:  Goal status: INITIAL  10. Patient will improve on functional tests: (TUG and 5x sit to stand) by 1-2 seconds Baseline:  Goal status: INITIAL  PLAN:  PT FREQUENCY: 2x/week  PT DURATION: 8 weeks  PLANNED INTERVENTIONS: 97164- PT Re-evaluation, 97110-Therapeutic exercises, 97530- Therapeutic activity, 97112- Neuromuscular re-education, 97535- Self Care, 16109- Manual therapy, [phone]- Gait training, [phone]- Canalith repositioning, [id number]- Aquatic Therapy, 97014- Electrical stimulation (unattended), 97016- Vasopneumatic device, 97035- Ultrasound, 91478- Ionotophoresis 4mg /ml Dexamethasone, Patient/Family education, Balance training, Stair training, Taping, Dry Needling, Joint mobilization, Spinal mobilization, Vestibular training, Visual/preceptual remediation/compensation, Cryotherapy, and Moist heat  PLAN FOR NEXT SESSION: Nustep, Balance and gait training, fall prevention and LE weakness, vestibular rehab   Reather Laurence, PT, DPT 01/20/24, 11:47 AM  [facility] [address], [address] [place], [place] [address] Phone # [phone] Fax [phone]

## 2024-01-21 ENCOUNTER — Encounter: Payer: Self-pay | Admitting: Adult Health

## 2024-01-21 ENCOUNTER — Ambulatory Visit: Admitting: Adult Health

## 2024-01-21 VITALS — BP 122/70 | HR 71 | Temp 98.3°F | Wt 172.0 lb

## 2024-01-21 DIAGNOSIS — K922 Gastrointestinal hemorrhage, unspecified: Secondary | ICD-10-CM | POA: Diagnosis not present

## 2024-01-21 LAB — CBC
HCT: 37.1 % — ABNORMAL LOW (ref 39.0–52.0)
Hemoglobin: 12.2 g/dL — ABNORMAL LOW (ref 13.0–17.0)
MCHC: 32.9 g/dL (ref 30.0–36.0)
MCV: 89.6 fl (ref 78.0–100.0)
Platelets: 241 10*3/uL (ref 150.0–400.0)
RBC: 4.14 Mil/uL — ABNORMAL LOW (ref 4.22–5.81)
RDW: 14.1 % (ref 11.5–15.5)
WBC: 8.9 10*3/uL (ref 4.0–10.5)

## 2024-01-21 LAB — IBC + FERRITIN
Ferritin: 36.8 ng/mL (ref 22.0–322.0)
Iron: 53 ug/dL (ref 42–165)
Saturation Ratios: 14.2 % — ABNORMAL LOW (ref 20.0–50.0)
TIBC: 373.8 ug/dL (ref 250.0–450.0)
Transferrin: 267 mg/dL (ref 212.0–360.0)

## 2024-01-21 MED ORDER — PANTOPRAZOLE SODIUM 40 MG PO TBEC: 40.0000 mg | DELAYED_RELEASE_TABLET | Freq: Every day | ORAL | 0 refills | Status: DC

## 2024-01-21 MED ORDER — SUCRALFATE 1 G PO TABS: 1.0000 g | ORAL_TABLET | Freq: Three times a day (TID) | ORAL | 0 refills | Status: DC

## 2024-01-21 NOTE — Progress Notes (Addendum)
 Subjective:    Patient ID: Ian [name], male    DOB: 08/07/1941, 83 y.o.   MRN: [id number]  HPI 83 year old male who  has a past medical history of Allergy, Apnea, sleep (07/28/07), Carotid artery stenosis, Coronary heart disease (2008), Diabetes mellitus, GERD (gastroesophageal reflux disease), Hyperlipidemia, Hypertension, and Hypospadias.  He presents to the office today for an acute visit. He reports that over the last 8-10 days he has been experiencing black stools. Reports that it first started off as black streaks and then as the week has gone the entire stool has become dark black. He has not noticed any red blood. Denies abdominal pain, nausea or vomiting. He does not have any dizziness past baseline ( being seen at PT for vertigo).   He has not taken any oral iron supplements or pepto bismol.   He has not had any weight loss or decreased appetite   Wt Readings from Last 3 Encounters:  01/21/24 172 lb (78 kg)  12/22/23 173 lb (78.5 kg)  11/24/23 169 lb (76.7 kg)    Review of Systems See HPI   Past Medical History:  Diagnosis Date   Allergy    Apnea, sleep 07/28/07   NPSG [place], AHI 79.9   Carotid artery stenosis    mild 04/2010, 05/2012   Coronary heart disease 2008   Cath 2008, 60-70% proximal diagonal 1 stenosis,  RCA 40% stenosis.  nonischemic Lexiscan 03/2011   Diabetes mellitus    type II   GERD (gastroesophageal reflux disease)    Hyperlipidemia    Hypertension    Hypospadias     Social History   Socioeconomic History   Marital status: Married    Spouse name: Not on file   Number of children: 3   Years of education: Not on file   Highest education level: Master's degree (e.g., MA, MS, MEng, MEd, MSW, MBA)  Occupational History   Occupation: Retired Economist: RETIRED  Tobacco Use   Smoking status: Never   Smokeless tobacco: Never  Substance and Sexual Activity   Alcohol use: No    Alcohol/week: 0.0 standard drinks of alcohol    Drug use: No   Sexual activity: Not on file  Other Topics Concern   Not on file  Social History Narrative   Married.  Three children from first marriage.  First wife died of ovarian cancer.     Retired, did work for [employer] for 44 years.    Social Drivers of Corporate investment banker Strain: Low Risk  (11/18/2023)   Overall Financial Resource Strain (CARDIA)    Difficulty of Paying Living Expenses: Not hard at all  Food Insecurity: No Food Insecurity (11/18/2023)   Hunger Vital Sign    Worried About Running Out of Food in the Last Year: Never true    Ran Out of Food in the Last Year: Never true  Transportation Needs: No Transportation Needs (11/18/2023)   PRAPARE - Administrator, Civil Service (Medical): No    Lack of Transportation (Non-Medical): No  Physical Activity: Insufficiently Active (11/18/2023)   Exercise Vital Sign    Days of Exercise per Week: 3 days    Minutes of Exercise per Session: 40 min  Stress: No Stress Concern Present (11/18/2023)   [employer] of Occupational Health - Occupational Stress Questionnaire    Feeling of Stress : Only a little  Social Connections: Socially Integrated (11/18/2023)   Social Connection  and Isolation Panel [NHANES]    Frequency of Communication with Friends and Family: More than three times a week    Frequency of Social Gatherings with Friends and Family: Once a week    Attends Religious Services: More than 4 times per year    Active Member of [employer] or Organizations: Yes    Attends Engineer, structural: More than 4 times per year    Marital Status: Married  Catering manager Violence: Unknown (12/24/2022)   Received from [employer], [facility]   HITS    Physically Hurt: Not on file    Insult or Talk Down To: Not on file    Threaten Physical Harm: Not on file    Scream or Curse: Not on file    Past Surgical History:  Procedure Laterality Date   EYE SURGERY     NASAL SINUS SURGERY     NISSEN  FUNDOPLICATION     TONSILLECTOMY     VEIN LIGATION AND STRIPPING      Family History  Problem Relation Age of Onset   Coronary artery disease Father 58       CABG   Dementia Mother     Allergies  Allergen Reactions   Iohexol      Desc: pt. states severe reaction, dr. told patient never to have the dye again     Current Outpatient Medications on File Prior to Visit  Medication Sig Dispense Refill   aspirin 81 MG tablet Take 1 tablet (81 mg total) by mouth daily. 90 tablet 3   Cholecalciferol (VITAMIN D3 PO) Take 10,000 Units by mouth once a week.     clindamycin (CLEOCIN T) 1 % external solution SMARTSIG:Topical Twice a Week PRN     clotrimazole-betamethasone (LOTRISONE) cream Apply 1 Application topically daily. 30 g 0   Coenzyme Q10 (CO Q10) 100 MG CAPS Take 1 tablet by mouth daily.     econazole nitrate 1 % cream Apply topically daily. 15 g 2   FARXIGA 10 MG TABS tablet Take 10 mg by mouth daily.     fluticasone (FLONASE) 50 MCG/ACT nasal spray USE 2 SPRAYS IN EACH NOSTRIL DAILY 48 g 3   gabapentin (NEURONTIN) 300 MG capsule TAKE 1 TO 2 CAPSULES IN THE EVENING 180 capsule 3   glipiZIDE (GLUCOTROL XL) 2.5 MG 24 hr tablet TAKE 1 TABLET DAILY WITH BREAKFAST 90 tablet 3   GLUCOSAMINE-CHONDROITIN PO Take by mouth.     hydrocortisone 2.5 % cream Apply topically 2 (two) times daily. 453.6 g 0   Lancets (ONETOUCH ULTRASOFT) lancets USE TO TEST BLOOD GLUCOSE TWICE DAILY 200 each 3   nitroGLYCERIN (NITROSTAT) 0.4 MG SL tablet DISSOLVE 1 TABLET UNDER THE TONGUE EVERY 5 MINUTES AS NEEDED FOR CHEST PAIN 75 tablet 14   olmesartan (BENICAR) 20 MG tablet TAKE 1 TABLET DAILY 90 tablet 3   Omega-3 Fatty Acids (FISH OIL ULTRA) 1400 MG CAPS Take by mouth.     SYSTANE ULTRA 0.4-0.3 % SOLN      tamsulosin (FLOMAX) 0.4 MG CAPS capsule Take 1 capsule (0.4 mg total) by mouth daily. 90 capsule 3   traMADol (ULTRAM) 50 MG tablet TAKE 1 TABLET EVERY 8 HOURS AS NEEDED 90 tablet 2   zolpidem (AMBIEN) 5  MG tablet TAKE 1 TABLET AT BEDTIME AS NEEDED FOR SLEEP 90 tablet 0   rosuvastatin (CRESTOR) 40 MG tablet Take 1 tablet (40 mg total) by mouth daily. 90 tablet 3   No current facility-administered medications  on file prior to visit.    BP 122/70   Pulse 71   Temp 98.3 F (36.8 C) (Oral)   Wt 172 lb (78 kg)   SpO2 97%   BMI 26.94 kg/m       Objective:   Physical Exam Constitutional:      Appearance: Normal appearance.  Cardiovascular:     Rate and Rhythm: Normal rate and regular rhythm.     Pulses: Normal pulses.     Heart sounds: Normal heart sounds.  Pulmonary:     Effort: Pulmonary effort is normal.     Breath sounds: Normal breath sounds.  Abdominal:     General: Abdomen is flat. Bowel sounds are normal. There is no distension.     Palpations: Abdomen is soft. There is no mass.     Tenderness: There is no abdominal tenderness. There is guarding. There is no rebound.  Genitourinary:    Rectum: Guaiac result positive.  Musculoskeletal:        General: Normal range of motion.  Skin:    General: Skin is warm and dry.  Neurological:     General: No focal deficit present.     Mental Status: He is alert and oriented to person, place, and time.  Psychiatric:        Mood and Affect: Mood normal.        Behavior: Behavior normal.        Thought Content: Thought content normal.        Judgment: Judgment normal.       Assessment & Plan:  1. Gastrointestinal hemorrhage, unspecified gastrointestinal hemorrhage type (Primary) - Positive hemoccult. Patient appears to be hemodynamically stable.  - Will check Iron and CBC today  - Referral to GI  - CBC; Future - IBC + Ferritin; Future - pantoprazole (PROTONIX) 40 MG tablet; Take 1 tablet (40 mg total) by mouth daily.  Dispense: 90 tablet; Refill: 0 - sucralfate (CARAFATE) 1 g tablet; Take 1 tablet (1 g total) by mouth 4 (four) times daily -  with meals and at bedtime.  Dispense: 120 tablet; Refill: 0 - If symptoms worsen  then go to the ER   Shirline Frees, NP  Time spent with patient today was 32 minutes which consisted of chart review, discussing GI bleeding, work up, treatment answering questions and documentation.

## 2024-01-24 ENCOUNTER — Encounter: Payer: Self-pay | Admitting: Adult Health

## 2024-01-24 ENCOUNTER — Telehealth: Payer: Self-pay | Admitting: Internal Medicine

## 2024-01-24 NOTE — Telephone Encounter (Signed)
 Dr. Leonides Schanz Doc of Day PM 01/24/24   A urgent referral was received for patient for a gastrointestinal hemorrhage. Patient does not have any gastroenterology history within the past year. Please review and advise on scheduling.   Thank you.

## 2024-01-25 ENCOUNTER — Ambulatory Visit: Payer: Medicare Other

## 2024-01-25 ENCOUNTER — Encounter: Payer: Self-pay | Admitting: Gastroenterology

## 2024-01-25 DIAGNOSIS — R293 Abnormal posture: Secondary | ICD-10-CM

## 2024-01-25 DIAGNOSIS — R2681 Unsteadiness on feet: Secondary | ICD-10-CM

## 2024-01-25 DIAGNOSIS — R252 Cramp and spasm: Secondary | ICD-10-CM | POA: Diagnosis not present

## 2024-01-25 DIAGNOSIS — R262 Difficulty in walking, not elsewhere classified: Secondary | ICD-10-CM

## 2024-01-25 DIAGNOSIS — M6281 Muscle weakness (generalized): Secondary | ICD-10-CM

## 2024-01-25 NOTE — Therapy (Signed)
 OUTPATIENT PHYSICAL THERAPY BALANCE TREATMENT  Patient Name: Ian [name] MRN: [id number] DOB:22-Jan-1941, 83 y.o., male Today's Date: 01/25/2024  END OF SESSION:  PT End of Session - 01/25/24 1154     Visit Number 20    Date for PT Re-Evaluation 01/31/24    Authorization Type MCR    Progress Note Due on Visit 20    PT Start Time 1148    PT Stop Time 1230    PT Time Calculation (min) 42 min    Activity Tolerance Patient tolerated treatment well    Behavior During Therapy [facility] for tasks assessed/performed                Past Medical History:  Diagnosis Date   Allergy    Apnea, sleep 07/28/07   NPSG [place], AHI 79.9   Carotid artery stenosis    mild 04/2010, 05/2012   Coronary heart disease 2008   Cath 2008, 60-70% proximal diagonal 1 stenosis,  RCA 40% stenosis.  nonischemic Lexiscan 03/2011   Diabetes mellitus    type II   GERD (gastroesophageal reflux disease)    Hyperlipidemia    Hypertension    Hypospadias    Past Surgical History:  Procedure Laterality Date   EYE SURGERY     NASAL SINUS SURGERY     NISSEN FUNDOPLICATION     TONSILLECTOMY     VEIN LIGATION AND STRIPPING     Patient Active Problem List   Diagnosis Date Noted   Dizziness 09/25/2020   CKD (chronic kidney disease), stage II 09/25/2020   Type 2 diabetes mellitus with complication, without long-term current use of insulin (HCC) 09/21/2019   Coronary artery disease involving native coronary artery of native heart without angina pectoris 03/23/2017   Bilateral carotid artery stenosis 03/23/2017   Dyslipidemia 03/23/2017   IBS (irritable bowel syndrome) 12/12/2014   Erectile dysfunction 01/19/2014   Diabetic polyneuropathy (HCC) 11/20/2013   Shortness of breath 09/26/2012   Chronic insomnia 03/25/2012   PALPITATIONS 11/19/2010   URINARY INCONTINENCE, PASSIVE, CONTINUOUS LEAKAGE 11/19/2010   Disorder resulting from impaired renal function 11/13/2009   VARICOSE VEINS, LOWER EXTREMITIES  09/17/2009   BPH with urinary obstruction 09/17/2009   RESTLESS LEG SYNDROME 02/12/2009   HIP PAIN, RIGHT 02/12/2009   DIAPHRAGMATIC DISORDER 10/26/2008   Diabetes mellitus without complication (HCC) 01/08/2008   Hyperlipidemia 01/08/2008   Essential hypertension 01/08/2008   Coronary atherosclerosis 01/08/2008   Allergic rhinitis 01/08/2008   Obstructive sleep apnea 01/08/2008    PCP: Shirline Frees, NP   REFERRING PROVIDER: Shirline Frees, NP   REFERRING DIAG: Current: Balance and Falls risk Initial: M79.621 (ICD-10-CM) - Pain in right upper arm   THERAPY DIAG:  Unsteadiness on feet  Difficulty in walking, not elsewhere classified  Muscle weakness (generalized)  Cramp and spasm  Abnormal posture  Rationale for Evaluation and Treatment: Rehabilitation  ONSET DATE: June 2024  SUBJECTIVE:  SUBJECTIVE STATEMENT: Patient reports he had appt with PCP and was found to have low hemoglobin.  He is being referred to G.I. provider.        Hand dominance: Right  PERTINENT HISTORY: CAD, DM, HTN, kidney issues  PAIN:  Are you having pain? Yes: NPRS scale: 0-2/10 Pain location: knees and shoulders Pain description: sharp Aggravating factors: IR, reaching OH, exercises Relieving factors: rest  PRECAUTIONS: None  RED FLAGS: None   WEIGHT BEARING RESTRICTIONS: No  FALLS:  Has patient fallen in last 6 months? No  LIVING ENVIRONMENT: Lives with: lives with their spouse Lives in: House/apartment Stairs: Yes: External: 1 steps; none Has following equipment at home: None  OCCUPATION: retired  PLOF: Independent  PATIENT GOALS: freedom of motion without pain  NEXT MD VISIT:   OBJECTIVE:  Note: Objective measures were completed at Evaluation unless otherwise noted.  DIAGNOSTIC  FINDINGS:  IMPRESSION: 1. Mild acromioclavicular degenerative change. 2. Mild subcortical cystic change in the lateral humeral head, nonspecific, but can be seen with rotator cuff pathology.  PATIENT SURVEYS:  Initial: FOTO 56 (goal 65)  12/03/23: goal was 65, score today was 77  Initial eval: 5x sit to stand: 16.61 seconds with min UE support  TUG deferred  12/03/23: 5x sit to stand: 9.67 seconds with min UE support  TUG: 9.44 sec  COGNITION: Overall cognitive status: Within functional limits for tasks assessed     SENSATION: WFL  POSTURE: Rounded shoulders, forward head  UPPER EXTREMITY ROM: * marked pain  A/P ROM Right eval Right 12/03/23 Left eval Left 12/03/23  Shoulder flexion 115/142* WFL 132 A/ROM [facility]  Shoulder extension      Shoulder abduction 108/120* WFL 108 A/ROM [facility]  Shoulder adduction      Shoulder internal rotation 56/70* WFL    Shoulder external rotation 54/55* WFL    Elbow flexion      Elbow extension      Wrist flexion      Wrist extension      Wrist ulnar deviation      Wrist radial deviation      Wrist pronation      Wrist supination      (Blank rows = not tested)  UPPER EXTREMITY MMT:  MMT Right eval Right 12/03/23 Left eval  Shoulder flexion 4- 4+   Shoulder extension 5 5   Shoulder abduction 4- 4+   Shoulder adduction     Shoulder internal rotation 4+ 5   Shoulder external rotation 4* 4+   Middle trapezius     Lower trapezius     Elbow flexion 4 4+   Elbow extension 5 5   Wrist flexion     Wrist extension 5 5   Wrist ulnar deviation     Wrist radial deviation     Wrist pronation     Wrist supination     Grip strength (lbs)     (Blank rows = not tested)  SHOULDER SPECIAL TESTS: Impingement tests: Neer impingement test: positive  and Hawkins/Kennedy impingement test: positive  Rotator cuff assessment: Empty can test: negative and Full can test: positive   JOINT MOBILITY TESTING:  Pain with AP mob, relief with  PA  PALPATION:  Marked in SITS tendons, IS muscle, LH biceps tendon   VESTIBULAR ASSESSMENT:  Orthostatic Hypotension: 12/21/2023: BP lying supine for 5 min:  128/55, HR 55 bpm BP After standing for 1 min: 137/63, HR 60 BP after standing for 3 min:  155/67, HR 65  12/21/2023:   Modified CTSIB: Condition 1- 30 sec, Condition 2- 30 sec, Condition 3- 30 sec, Condition 4- 30 sec.  Total- 120/120.  Some sway noted on condition 4 (eyes closed on foam pad). Seated ocular tracking:  normal/intact Saccades:  intact Head thrust:  slight nystagmus noted to each side  01/06/2024: Gilberto Better negative on right side Dix Hallpike slight positive on the left side *note, patient reports most dizziness with going from supine to sit and sit to stand    TODAY'S TREATMENT:                                                                                                                                         DATE:  01/25/24 Nustep x 5 min , level 5 (PT present to discuss status) Sit to stand x 10 with 10 lb kb Squats x 10 with 10 lbs Dead lift with 10 lb kb 2 x 10 Lat pull down 2 x 10 with 40 lbs  Step ups on bottom step x 10 each leg fwd, then x 10 on each leg lateral Seated LAQ with 5 lbs 2 x 10 Seated march with 5 lb ankle weights x 20 Seated hip ER with 5 lb ankle weights x 20 each LE (2 x 10) Lateral band walks with yellow loop x 5 laps at barre Reverse lunges using slider x 10 Side lunges using slider x 10 each LE Marching on balance pad x 20 (attempting no UE support) Squats on balance pad 2 x 10  01/20/2024 Nustep level 5 x6 min with PT present to discuss status Dix Hallpike negative on left. Dix Hallpike positive on the right.  Proceeded to Epley Maneuver for canalith repositioning x2. Tandem walking on AirEx beam down and back x3 laps Side stepping on AirEx beam down and back x3 laps Standing on rocker board with board placed in both positions x1 min each   01/18/24 Nustep x 5  min , level 5 (PT present to discuss status) In // bars: long balance beam fwd  and backward walking x 5 laps In // bars: long balance beam lateral walking x 5 laps In // bars: lateral band walks with red tband x 5 laps Lateral band walks with red tband tied x 5 laps in // bars Marching on balance pad x 20 (attempting no UE support) in // bars Step up and hold on balance pad x 10 each LE fwd then x 10 each LE lateral in // bars Reverse lunges using slider x 10 Side lunges using slider x 10 each LE Sit to stand with 2 x 10  PATIENT EDUCATION: Education details: PT eval findings, anticipated POC, initial HEP, and postural awareness  Person educated: Patient Education method: Explanation, Demonstration, and Handouts Education comprehension: verbalized understanding, returned demonstration, and verbal cues required  HOME EXERCISE PROGRAM: Access Code: [email or web address] URL: [email or web address] Date: 12/23/2023 Prepared by:  Mikey Kirschner  Exercises - Seated Shoulder Flexion AAROM with Dowel  - 2 x daily - 7 x weekly - 1-2 sets - 10 reps - Seated Shoulder External Rotation AAROM with Cane and Hand in Neutral  - 2 x daily - 7 x weekly - 1-2 sets - 10 reps - Seated Shoulder Abduction Towel Slide at Table Top (Mirrored)  - 2 x daily - 7 x weekly - 1-2 sets - 10 reps - Seated Scapular Retraction  - 1 x daily - 7 x weekly - 1 sets - 10 reps - 2-3 sec hold - Supine Shoulder Flexion Extension AAROM with Dowel  - 1 x daily - 7 x weekly - 1 sets - 20 reps - Supine Shoulder Abduction AAROM with Dowel  - 1 x daily - 7 x weekly - 1 sets - 20 reps - Supine Shoulder External Rotation in 45 Degrees Abduction AAROM with Dowel  - 1 x daily - 7 x weekly - 1 sets - 20 reps - Supine Shoulder Flexion Extension Full Range AROM  - 1 x daily - 7 x weekly - 2 sets - 10 reps - Single Arm Serratus Punches in Supine with Dumbbell  - 1 x daily - 7 x weekly - 2 sets - 10 reps - Sidelying Shoulder External  Rotation  - 1 x daily - 7 x weekly - 2 sets - 10 reps - Standing Row with Anchored Resistance  - 1 x daily - 7 x weekly - 2 sets - 10 reps - Shoulder Extension with Resistance  - 1 x daily - 7 x weekly - 2 sets - 10 reps - Side Stepping with Resistance at Ankles and Counter Support  - 1 x daily - 7 x weekly - 3 sets - 10 reps - Standing Hip Extension with Counter Support  - 1 x daily - 7 x weekly - 3 sets - 10 reps - Heel Toe Raises with Counter Support  - 1 x daily - 7 x weekly - 3 sets - 10 reps - Single Leg Cone Touch  - 1 x daily - 7 x weekly - 3 sets - 10 reps - Pencil Pushups  - 1 x daily - 7 x weekly - 2 sets - 10 reps - Seated Gaze Stabilization with Head Rotation  - 1 x daily - 7 x weekly - 1-2 sets - 30 sec hold - Seated Gaze Stabilization with Head Nod  - 1 x daily - 7 x weekly - 1-2 sets - 30-60 sec hold - Walking with Head Nod  - 1 x daily - 7 x weekly - 2 sets - 10 reps - Walking with Head Rotation  - 1 x daily - 7 x weekly - 2 sets - 10 reps - Standing Hamstring Stretch on Chair  - 1 x daily - 7 x weekly - 1 sets - 3 reps - 30 sec hold - Quadricep Stretch with Chair and Counter Support  - 1 x daily - 7 x weekly - 1 sets - 3 reps - 30 sec hold - Seated Figure 4 Piriformis Stretch  - 1 x daily - 7 x weekly - 1 sets - 3 reps - 30 sec hold   ASSESSMENT:  CLINICAL IMPRESSION: Mr Monnin was found to have low hemoglobin.  This may be causing some of his dizziness and generalized weakness.  MD has referred him to G.I. provider to r/o GI bleed. We modified his session today to do more seated exercises  as his energy level is low.   Patient continues to require skilled PT to progress towards goal related activities.  OBJECTIVE IMPAIRMENTS: decreased activity tolerance, decreased ROM, decreased strength, increased muscle spasms, impaired flexibility, impaired UE functional use, postural dysfunction, and pain.   ACTIVITY LIMITATIONS: bathing, dressing, reach over head, and  hygiene/grooming  PARTICIPATION LIMITATIONS:  anything with OH activity   PERSONAL FACTORS: Age, Fitness, Time since onset of injury/illness/exacerbation, and 3+ comorbidities: DM, CKD, CAD  are also affecting patient's functional outcome.   REHAB POTENTIAL: Good  CLINICAL DECISION MAKING: Evolving/moderate complexity  EVALUATION COMPLEXITY: Moderate   GOALS: Goals reviewed with patient? Yes  SHORT TERM GOALS: Target date: 11/09/2023   Patient will be independent with initial HEP.  Baseline: 10/26/23 Goal status: MET  2.  Decreased shoulder pain with ADLs by 25%  Baseline: reduced pain today (10/26/23) Goal status:MET 11/16/23   3.  Balance assessed for fall risk Baseline: issued sit to stand for HEP Goal status: MET  Target date  : 01/11/2024  4. Patient will report 50% improved confidence in overall balance Baseline:  Goal status: Met on 01/13/2024  LONG TERM GOALS: Target date: 12/07/2023   Patient will be independent with advanced/ongoing HEP to improve outcomes and carryover.  Baseline:  Goal status: MET 11/30/23  2.  Patient will report 75% improvement in R shoulder pain to improve QOL.  Baseline: 85% (11/25/23) Goal status: MET  3.  Patient to improve R shoulder AROM to [facility] without pain provocation to allow for increased ease of ADLs.  Baseline:  Goal status: MET 11/30/23  4.  Patient will demonstrate improved UE strength to 4+/5.  Baseline:  Goal status: MET 11/30/23  5.  Patient will report 44 on FOTO to demonstrate improved functional ability.  Baseline:  Goal status: MET 12/03/23  6.  Patient will be able to reach behind his back to dress without difficulty.   Baseline:  Goal status: MET 12/03/23  Target Date: 02/08/2024  7. Patient to report no falls or near falls over the course of the episode we are treating for balance and falls.   Baseline:  Goal status: Ongoing  8. Patient will be cleared of having vertigo symptoms Baseline:  Goal status:  Ongoing  9. Patient will score 38 on Berg balance scale Baseline:  Goal status: INITIAL  10. Patient will improve on functional tests: (TUG and 5x sit to stand) by 1-2 seconds Baseline:  Goal status: INITIAL  PLAN:  PT FREQUENCY: 2x/week  PT DURATION: 8 weeks  PLANNED INTERVENTIONS: 97164- PT Re-evaluation, 97110-Therapeutic exercises, 97530- Therapeutic activity, [id number]- Neuromuscular re-education, 97535- Self Care, 16109- Manual therapy, [id number]- Gait training, [phone]- Canalith repositioning, [id number]- Aquatic Therapy, 97014- Electrical stimulation (unattended), 97016- Vasopneumatic device, [id number]- Ultrasound, 09811- Ionotophoresis 4mg /ml Dexamethasone, Patient/Family education, Balance training, Stair training, Taping, Dry Needling, Joint mobilization, Spinal mobilization, Vestibular training, Visual/preceptual remediation/compensation, Cryotherapy, and Moist heat  PLAN FOR NEXT SESSION: Nustep, Balance and gait training, fall prevention and LE weakness, vestibular rehab   Dametria Tuzzolino B. Terre Hanneman, PT 01/25/24 8:41 PM [facility] [address] [place], [place] [address] Phone # [phone] Fax [phone]

## 2024-01-25 NOTE — Telephone Encounter (Signed)
 Patient has been scheduled for 4/3 with Dr. Barron Alvine.

## 2024-01-25 NOTE — Telephone Encounter (Signed)
 FYI

## 2024-01-27 ENCOUNTER — Ambulatory Visit: Payer: Medicare Other

## 2024-01-27 DIAGNOSIS — M6281 Muscle weakness (generalized): Secondary | ICD-10-CM | POA: Diagnosis not present

## 2024-01-27 DIAGNOSIS — R2681 Unsteadiness on feet: Secondary | ICD-10-CM

## 2024-01-27 DIAGNOSIS — R293 Abnormal posture: Secondary | ICD-10-CM | POA: Diagnosis not present

## 2024-01-27 DIAGNOSIS — R262 Difficulty in walking, not elsewhere classified: Secondary | ICD-10-CM

## 2024-01-27 DIAGNOSIS — R252 Cramp and spasm: Secondary | ICD-10-CM

## 2024-01-27 NOTE — Therapy (Signed)
 OUTPATIENT PHYSICAL THERAPY BALANCE TREATMENT PHYSICAL THERAPY DISCHARGE SUMMARY  Visits from Start of Care: 21  Current functional level related to goals / functional outcomes: See below   Remaining deficits: See below   Education / Equipment: See below   Patient agrees to discharge. Patient goals were met. Patient is being discharged due to meeting the stated rehab goals.   Patient Name: Ian [name] MRN: [id number] DOB:11/22/1940, 83 y.o., male Today's Date: 01/27/2024  END OF SESSION:  PT End of Session - 01/27/24 1145     Visit Number 21    Date for PT Re-Evaluation 01/31/24    Authorization Type MCR    Progress Note Due on Visit 30    PT Start Time 1146    PT Stop Time 1225    PT Time Calculation (min) 39 min    Activity Tolerance Patient tolerated treatment well    Behavior During Therapy [facility] for tasks assessed/performed                Past Medical History:  Diagnosis Date   Allergy    Apnea, sleep 07/28/07   NPSG [place], AHI 79.9   Carotid artery stenosis    mild 04/2010, 05/2012   Coronary heart disease 2008   Cath 2008, 60-70% proximal diagonal 1 stenosis,  RCA 40% stenosis.  nonischemic Lexiscan 03/2011   Diabetes mellitus    type II   GERD (gastroesophageal reflux disease)    Hyperlipidemia    Hypertension    Hypospadias    Past Surgical History:  Procedure Laterality Date   EYE SURGERY     NASAL SINUS SURGERY     NISSEN FUNDOPLICATION     TONSILLECTOMY     VEIN LIGATION AND STRIPPING     Patient Active Problem List   Diagnosis Date Noted   Dizziness 09/25/2020   CKD (chronic kidney disease), stage II 09/25/2020   Type 2 diabetes mellitus with complication, without long-term current use of insulin (HCC) 09/21/2019   Coronary artery disease involving native coronary artery of native heart without angina pectoris 03/23/2017   Bilateral carotid artery stenosis 03/23/2017   Dyslipidemia 03/23/2017   IBS (irritable bowel syndrome)  12/12/2014   Erectile dysfunction 01/19/2014   Diabetic polyneuropathy (HCC) 11/20/2013   Shortness of breath 09/26/2012   Chronic insomnia 03/25/2012   PALPITATIONS 11/19/2010   URINARY INCONTINENCE, PASSIVE, CONTINUOUS LEAKAGE 11/19/2010   Disorder resulting from impaired renal function 11/13/2009   VARICOSE VEINS, LOWER EXTREMITIES 09/17/2009   BPH with urinary obstruction 09/17/2009   RESTLESS LEG SYNDROME 02/12/2009   HIP PAIN, RIGHT 02/12/2009   DIAPHRAGMATIC DISORDER 10/26/2008   Diabetes mellitus without complication (HCC) 01/08/2008   Hyperlipidemia 01/08/2008   Essential hypertension 01/08/2008   Coronary atherosclerosis 01/08/2008   Allergic rhinitis 01/08/2008   Obstructive sleep apnea 01/08/2008    PCP: Shirline Frees, NP   REFERRING PROVIDER: Shirline Frees, NP   REFERRING DIAG: Current: Balance and Falls risk Initial: M79.621 (ICD-10-CM) - Pain in right upper arm   THERAPY DIAG:  Unsteadiness on feet  Difficulty in walking, not elsewhere classified  Muscle weakness (generalized)  Cramp and spasm  Abnormal posture  Rationale for Evaluation and Treatment: Rehabilitation  ONSET DATE: June 2024  SUBJECTIVE:  SUBJECTIVE STATEMENT: Patient reports he is thankful for all of the therapy and guidance we have offered him.  He hopes to continue his exercises and be able to continue to get stronger.        Hand dominance: Right  PERTINENT HISTORY: CAD, DM, HTN, kidney issues  PAIN:  01/27/24 Are you having pain? Yes: NPRS scale: 0/10 Pain location: knees and shoulders Pain description: sharp Aggravating factors: IR, reaching OH, exercises Relieving factors: rest  PRECAUTIONS: None  RED FLAGS: None   WEIGHT BEARING RESTRICTIONS: No  FALLS:  Has patient fallen in last  6 months? No  LIVING ENVIRONMENT: Lives with: lives with their spouse Lives in: House/apartment Stairs: Yes: External: 1 steps; none Has following equipment at home: None  OCCUPATION: retired  PLOF: Independent  PATIENT GOALS: freedom of motion without pain  NEXT MD VISIT:   OBJECTIVE:  Note: Objective measures were completed at Evaluation unless otherwise noted.  DIAGNOSTIC FINDINGS:  IMPRESSION: 1. Mild acromioclavicular degenerative change. 2. Mild subcortical cystic change in the lateral humeral head, nonspecific, but can be seen with rotator cuff pathology.  PATIENT SURVEYS:  Initial: FOTO 56 (goal 65)  12/03/23: goal was 65, score today was 77  Initial eval: 5x sit to stand: 16.61 seconds with min UE support  TUG deferred  12/03/23: 5x sit to stand: 9.67 seconds with min UE support  TUG: 9.44 sec  01/27/24: 5x sit to stand: 7.86 seconds with no UE support  TUG: 8.22 sec  COGNITION: Overall cognitive status: Within functional limits for tasks assessed     SENSATION: WFL  POSTURE: Rounded shoulders, forward head  UPPER EXTREMITY ROM: * marked pain  A/P ROM Right eval Right 12/03/23 Left eval Left 12/03/23  Shoulder flexion 115/142* WFL 132 A/ROM [facility]  Shoulder extension      Shoulder abduction 108/120* WFL 108 A/ROM [facility]  Shoulder adduction      Shoulder internal rotation 56/70* WFL    Shoulder external rotation 54/55* WFL    Elbow flexion      Elbow extension      Wrist flexion      Wrist extension      Wrist ulnar deviation      Wrist radial deviation      Wrist pronation      Wrist supination      (Blank rows = not tested)  UPPER EXTREMITY MMT:  MMT Right eval Right 12/03/23 Left eval  Shoulder flexion 4- 4+   Shoulder extension 5 5   Shoulder abduction 4- 4+   Shoulder adduction     Shoulder internal rotation 4+ 5   Shoulder external rotation 4* 4+   Middle trapezius     Lower trapezius     Elbow flexion 4 4+   Elbow extension  5 5   Wrist flexion     Wrist extension 5 5   Wrist ulnar deviation     Wrist radial deviation     Wrist pronation     Wrist supination     Grip strength (lbs)     (Blank rows = not tested)  LOWER EXTREMITY MMT:  01/27/24 Generally 4+to 5/5 with exception of glut medius 4/5 and hip extensors 4/5  SHOULDER SPECIAL TESTS: Impingement tests: Neer impingement test: positive  and Hawkins/Kennedy impingement test: positive  Rotator cuff assessment: Empty can test: negative and Full can test: positive   JOINT MOBILITY TESTING:  Pain with AP mob, relief with PA  PALPATION:  Marked in SITS  tendons, IS muscle, LH biceps tendon   VESTIBULAR ASSESSMENT:  Orthostatic Hypotension: 12/21/2023: BP lying supine for 5 min:  128/55, HR 55 bpm BP After standing for 1 min: 137/63, HR 60 BP after standing for 3 min:  155/67, HR 65  12/21/2023:   Modified CTSIB: Condition 1- 30 sec, Condition 2- 30 sec, Condition 3- 30 sec, Condition 4- 30 sec.  Total- 120/120.  Some sway noted on condition 4 (eyes closed on foam pad). Seated ocular tracking:  normal/intact Saccades:  intact Head thrust:  slight nystagmus noted to each side  01/06/2024: Gilberto Better negative on right side Dix Hallpike slight positive on the left side *note, patient reports most dizziness with going from supine to sit and sit to stand    TODAY'S TREATMENT:                                                                                                                                         DATE:  01/27/24 DC assessment completed: See above Standing hamstring stretch 3 x 30 sec Standing quad stretch 3 x 30 sec Seated piriformis stretch 3 x 30 sec Thorough review of HEP and how to progress: patient needed clarification on several exercises  01/25/24 Nustep x 5 min , level 5 (PT present to discuss status) Sit to stand x 10 with 10 lb kb Squats x 10 with 10 lbs Dead lift with 10 lb kb 2 x 10 Lat pull down 2 x 10 with 40 lbs   Step ups on bottom step x 10 each leg fwd, then x 10 on each leg lateral Seated LAQ with 5 lbs 2 x 10 Seated march with 5 lb ankle weights x 20 Seated hip ER with 5 lb ankle weights x 20 each LE (2 x 10) Lateral band walks with yellow loop x 5 laps at barre Reverse lunges using slider x 10 Side lunges using slider x 10 each LE Marching on balance pad x 20 (attempting no UE support) Squats on balance pad 2 x 10  01/20/2024 Nustep level 5 x6 min with PT present to discuss status Dix Hallpike negative on left. Dix Hallpike positive on the right.  Proceeded to Epley Maneuver for canalith repositioning x2. Tandem walking on AirEx beam down and back x3 laps Side stepping on AirEx beam down and back x3 laps Standing on rocker board with board placed in both positions x1 min each  PATIENT EDUCATION: Education details: PT eval findings, anticipated POC, initial HEP, and postural awareness  Person educated: Patient Education method: Explanation, Demonstration, and Handouts Education comprehension: verbalized understanding, returned demonstration, and verbal cues required  HOME EXERCISE PROGRAM: Access Code: [email or web address] URL: [email or web address] Date: 12/23/2023 Prepared by: Mikey Kirschner  Exercises - Seated Shoulder Flexion AAROM with Dowel  - 2 x daily - 7 x weekly - 1-2 sets - 10 reps - Seated Shoulder External Rotation AAROM with  Cane and Hand in Neutral  - 2 x daily - 7 x weekly - 1-2 sets - 10 reps - Seated Shoulder Abduction Towel Slide at Table Top (Mirrored)  - 2 x daily - 7 x weekly - 1-2 sets - 10 reps - Seated Scapular Retraction  - 1 x daily - 7 x weekly - 1 sets - 10 reps - 2-3 sec hold - Supine Shoulder Flexion Extension AAROM with Dowel  - 1 x daily - 7 x weekly - 1 sets - 20 reps - Supine Shoulder Abduction AAROM with Dowel  - 1 x daily - 7 x weekly - 1 sets - 20 reps - Supine Shoulder External Rotation in 45 Degrees Abduction AAROM with Dowel  - 1 x daily -  7 x weekly - 1 sets - 20 reps - Supine Shoulder Flexion Extension Full Range AROM  - 1 x daily - 7 x weekly - 2 sets - 10 reps - Single Arm Serratus Punches in Supine with Dumbbell  - 1 x daily - 7 x weekly - 2 sets - 10 reps - Sidelying Shoulder External Rotation  - 1 x daily - 7 x weekly - 2 sets - 10 reps - Standing Row with Anchored Resistance  - 1 x daily - 7 x weekly - 2 sets - 10 reps - Shoulder Extension with Resistance  - 1 x daily - 7 x weekly - 2 sets - 10 reps - Side Stepping with Resistance at Ankles and Counter Support  - 1 x daily - 7 x weekly - 3 sets - 10 reps - Standing Hip Extension with Counter Support  - 1 x daily - 7 x weekly - 3 sets - 10 reps - Heel Toe Raises with Counter Support  - 1 x daily - 7 x weekly - 3 sets - 10 reps - Single Leg Cone Touch  - 1 x daily - 7 x weekly - 3 sets - 10 reps - Pencil Pushups  - 1 x daily - 7 x weekly - 2 sets - 10 reps - Seated Gaze Stabilization with Head Rotation  - 1 x daily - 7 x weekly - 1-2 sets - 30 sec hold - Seated Gaze Stabilization with Head Nod  - 1 x daily - 7 x weekly - 1-2 sets - 30-60 sec hold - Walking with Head Nod  - 1 x daily - 7 x weekly - 2 sets - 10 reps - Walking with Head Rotation  - 1 x daily - 7 x weekly - 2 sets - 10 reps - Standing Hamstring Stretch on Chair  - 1 x daily - 7 x weekly - 1 sets - 3 reps - 30 sec hold - Quadricep Stretch with Chair and Counter Support  - 1 x daily - 7 x weekly - 1 sets - 3 reps - 30 sec hold - Seated Figure 4 Piriformis Stretch  - 1 x daily - 7 x weekly - 1 sets - 3 reps - 30 sec hold   ASSESSMENT:  CLINICAL IMPRESSION: Mr Tozzi has met all goals.  He will need to continue to be attentive to his posture and stretching.  We did a thorough review of his HEP and emphasized the various stretches that were most important to help with his posture.  He is compliant and well motivated.  He should continue to improve with consistent HEP and being attentive to his posture.     OBJECTIVE IMPAIRMENTS: decreased activity tolerance, decreased  ROM, decreased strength, increased muscle spasms, impaired flexibility, impaired UE functional use, postural dysfunction, and pain.   ACTIVITY LIMITATIONS: bathing, dressing, reach over head, and hygiene/grooming  PARTICIPATION LIMITATIONS:  anything with OH activity   PERSONAL FACTORS: Age, Fitness, Time since onset of injury/illness/exacerbation, and 3+ comorbidities: DM, CKD, CAD  are also affecting patient's functional outcome.   REHAB POTENTIAL: Good  CLINICAL DECISION MAKING: Evolving/moderate complexity  EVALUATION COMPLEXITY: Moderate   GOALS: Goals reviewed with patient? Yes  SHORT TERM GOALS: Target date: 11/09/2023   Patient will be independent with initial HEP.  Baseline: 10/26/23 Goal status: MET  2.  Decreased shoulder pain with ADLs by 25%  Baseline: reduced pain today (10/26/23) Goal status:MET 11/16/23   3.  Balance assessed for fall risk Baseline: issued sit to stand for HEP Goal status: MET  Target date  : 01/11/2024  4. Patient will report 50% improved confidence in overall balance Baseline:  Goal status: Met on 01/13/2024  LONG TERM GOALS: Target date: 12/07/2023   Patient will be independent with advanced/ongoing HEP to improve outcomes and carryover.  Baseline:  Goal status: MET 11/30/23  2.  Patient will report 75% improvement in R shoulder pain to improve QOL.  Baseline: 85% (11/25/23) Goal status: MET  3.  Patient to improve R shoulder AROM to [facility] without pain provocation to allow for increased ease of ADLs.  Baseline:  Goal status: MET 11/30/23  4.  Patient will demonstrate improved UE strength to 4+/5.  Baseline:  Goal status: MET 11/30/23  5.  Patient will report 82 on FOTO to demonstrate improved functional ability.  Baseline:  Goal status: MET 12/03/23  6.  Patient will be able to reach behind his back to dress without difficulty.   Baseline:  Goal status: MET  12/03/23  Target Date: 02/08/2024  7. Patient to report no falls or near falls over the course of the episode we are treating for balance and falls.   Baseline:  Goal status: MET 01/27/24  8. Patient will be cleared of having vertigo symptoms Baseline:  Goal status: Mostly MET 01/27/24  9. Patient will score 38 on Berg balance scale Baseline:  Goal status: MET (scored 41 on 12/14/23)  10. Patient will improve on functional tests: (TUG and 5x sit to stand) by 1-2 seconds Baseline:  Goal status: MET  PLAN:  PT FREQUENCY: 2x/week  PT DURATION: 8 weeks  PLANNED INTERVENTIONS: 97164- PT Re-evaluation, 97110-Therapeutic exercises, 97530- Therapeutic activity, [id number]- Neuromuscular re-education, 97535- Self Care, 81191- Manual therapy, [id number]- Gait training, [phone]- Canalith repositioning, [id number]- Aquatic Therapy, 97014- Electrical stimulation (unattended), 97016- Vasopneumatic device, [id number]- Ultrasound, [id number]- Ionotophoresis 4mg /ml Dexamethasone, Patient/Family education, Balance training, Stair training, Taping, Dry Needling, Joint mobilization, Spinal mobilization, Vestibular training, Visual/preceptual remediation/compensation, Cryotherapy, and Moist heat  PLAN FOR NEXT SESSION: DC   Kalynne Womac B. Sapna Padron, PT 01/27/24 2:33 PM [facility] [address], [address] [place], [place] [address] Phone # [phone] Fax [phone]

## 2024-02-04 ENCOUNTER — Ambulatory Visit

## 2024-02-04 ENCOUNTER — Encounter: Payer: Self-pay | Admitting: Adult Health

## 2024-02-04 ENCOUNTER — Ambulatory Visit: Admitting: Adult Health

## 2024-02-04 VITALS — BP 140/80 | HR 69 | Temp 97.6°F | Ht 67.0 in | Wt 175.0 lb

## 2024-02-04 DIAGNOSIS — M79661 Pain in right lower leg: Secondary | ICD-10-CM | POA: Diagnosis not present

## 2024-02-04 DIAGNOSIS — K625 Hemorrhage of anus and rectum: Secondary | ICD-10-CM

## 2024-02-04 DIAGNOSIS — M25571 Pain in right ankle and joints of right foot: Secondary | ICD-10-CM

## 2024-02-04 DIAGNOSIS — M19071 Primary osteoarthritis, right ankle and foot: Secondary | ICD-10-CM | POA: Diagnosis not present

## 2024-02-04 DIAGNOSIS — M25561 Pain in right knee: Secondary | ICD-10-CM | POA: Diagnosis not present

## 2024-02-04 LAB — URIC ACID: Uric Acid, Serum: 5.1 mg/dL (ref 4.0–7.8)

## 2024-02-04 MED ORDER — PREDNISONE 10 MG PO TABS: 10.0000 mg | ORAL_TABLET | Freq: Every day | ORAL | 0 refills | Status: DC

## 2024-02-04 NOTE — Progress Notes (Signed)
 Subjective:    Patient ID: FINDLAY DAGHER, male    DOB: 1941/07/18, 83 y.o.   MRN: [id number]  Foot Pain   83 year old male who  has a past medical history of Allergy, Apnea, sleep (07/28/07), Carotid artery stenosis, Coronary heart disease (2008), Diabetes mellitus, GERD (gastroesophageal reflux disease), Hyperlipidemia, Hypertension, and Hypospadias.  He presents to the office today for pain in his right foot that is progressing over the last 3-4 months. His pain is described as a burning pain that is present constantly and becomes worse with ROM. Burning pain can radiate up the inside and  outside of his right leg. He reports last night his right ankle became swollen ( has since resolved) and that the bed sheet over his ankle was painful. He did not notice any redness. He does have known history of diabetic neuropathy   Additionally he reports that he is no longer having any rectal bleeding after starting Protonix and Carafate. He does have an appointment with GI next week that he plans of keeping    Review of Systems See HPI   Past Medical History:  Diagnosis Date   Allergy    Apnea, sleep 07/28/07   NPSG [place], AHI 79.9   Carotid artery stenosis    mild 04/2010, 05/2012   Coronary heart disease 2008   Cath 2008, 60-70% proximal diagonal 1 stenosis,  RCA 40% stenosis.  nonischemic Lexiscan 03/2011   Diabetes mellitus    type II   GERD (gastroesophageal reflux disease)    Hyperlipidemia    Hypertension    Hypospadias     Social History   Socioeconomic History   Marital status: Married    Spouse name: Not on file   Number of children: 3   Years of education: Not on file   Highest education level: Master's degree (e.g., MA, MS, MEng, MEd, MSW, MBA)  Occupational History   Occupation: Retired Economist: RETIRED  Tobacco Use   Smoking status: Never   Smokeless tobacco: Never  Substance and Sexual Activity   Alcohol use: No    Alcohol/week: 0.0  standard drinks of alcohol   Drug use: No   Sexual activity: Not on file  Other Topics Concern   Not on file  Social History Narrative   Married.  Three children from first marriage.  First wife died of ovarian cancer.     Retired, did work for [employer] for 44 years.    Social Drivers of Corporate investment banker Strain: Low Risk  (11/18/2023)   Overall Financial Resource Strain (CARDIA)    Difficulty of Paying Living Expenses: Not hard at all  Food Insecurity: No Food Insecurity (11/18/2023)   Hunger Vital Sign    Worried About Running Out of Food in the Last Year: Never true    Ran Out of Food in the Last Year: Never true  Transportation Needs: No Transportation Needs (11/18/2023)   PRAPARE - Administrator, Civil Service (Medical): No    Lack of Transportation (Non-Medical): No  Physical Activity: Insufficiently Active (11/18/2023)   Exercise Vital Sign    Days of Exercise per Week: 3 days    Minutes of Exercise per Session: 40 min  Stress: No Stress Concern Present (11/18/2023)   [employer] of Occupational Health - Occupational Stress Questionnaire    Feeling of Stress : Only a little  Social Connections: Socially Integrated (11/18/2023)   Social Connection and Isolation  Panel [NHANES]    Frequency of Communication with Friends and Family: More than three times a week    Frequency of Social Gatherings with Friends and Family: Once a week    Attends Religious Services: More than 4 times per year    Active Member of [employer] or Organizations: Yes    Attends Engineer, structural: More than 4 times per year    Marital Status: Married  Catering manager Violence: Unknown (12/24/2022)   Received from [employer], [facility]   HITS    Physically Hurt: Not on file    Insult or Talk Down To: Not on file    Threaten Physical Harm: Not on file    Scream or Curse: Not on file    Past Surgical History:  Procedure Laterality Date   EYE SURGERY     NASAL  SINUS SURGERY     NISSEN FUNDOPLICATION     TONSILLECTOMY     VEIN LIGATION AND STRIPPING      Family History  Problem Relation Age of Onset   Coronary artery disease Father 33       CABG   Dementia Mother     Allergies  Allergen Reactions   Iohexol      Desc: pt. states severe reaction, dr. told patient never to have the dye again     Current Outpatient Medications on File Prior to Visit  Medication Sig Dispense Refill   aspirin 81 MG tablet Take 1 tablet (81 mg total) by mouth daily. 90 tablet 3   Cholecalciferol (VITAMIN D3 PO) Take 10,000 Units by mouth once a week.     clindamycin (CLEOCIN T) 1 % external solution SMARTSIG:Topical Twice a Week PRN     clotrimazole-betamethasone (LOTRISONE) cream Apply 1 Application topically daily. 30 g 0   Coenzyme Q10 (CO Q10) 100 MG CAPS Take 1 tablet by mouth daily.     econazole nitrate 1 % cream Apply topically daily. 15 g 2   FARXIGA 10 MG TABS tablet Take 10 mg by mouth daily.     fluticasone (FLONASE) 50 MCG/ACT nasal spray USE 2 SPRAYS IN EACH NOSTRIL DAILY 48 g 3   gabapentin (NEURONTIN) 300 MG capsule TAKE 1 TO 2 CAPSULES IN THE EVENING 180 capsule 3   glipiZIDE (GLUCOTROL XL) 2.5 MG 24 hr tablet TAKE 1 TABLET DAILY WITH BREAKFAST 90 tablet 3   GLUCOSAMINE-CHONDROITIN PO Take by mouth.     hydrocortisone 2.5 % cream Apply topically 2 (two) times daily. 453.6 g 0   Lancets (ONETOUCH ULTRASOFT) lancets USE TO TEST BLOOD GLUCOSE TWICE DAILY 200 each 3   nitroGLYCERIN (NITROSTAT) 0.4 MG SL tablet DISSOLVE 1 TABLET UNDER THE TONGUE EVERY 5 MINUTES AS NEEDED FOR CHEST PAIN 75 tablet 14   olmesartan (BENICAR) 20 MG tablet TAKE 1 TABLET DAILY 90 tablet 3   Omega-3 Fatty Acids (FISH OIL ULTRA) 1400 MG CAPS Take by mouth.     pantoprazole (PROTONIX) 40 MG tablet Take 1 tablet (40 mg total) by mouth daily. 90 tablet 0   sucralfate (CARAFATE) 1 g tablet Take 1 tablet (1 g total) by mouth 4 (four) times daily -  with meals and at bedtime.  120 tablet 0   SYSTANE ULTRA 0.4-0.3 % SOLN      tamsulosin (FLOMAX) 0.4 MG CAPS capsule Take 1 capsule (0.4 mg total) by mouth daily. 90 capsule 3   traMADol (ULTRAM) 50 MG tablet TAKE 1 TABLET EVERY 8 HOURS AS NEEDED 90  tablet 2   zolpidem (AMBIEN) 5 MG tablet TAKE 1 TABLET AT BEDTIME AS NEEDED FOR SLEEP 90 tablet 0   rosuvastatin (CRESTOR) 40 MG tablet Take 1 tablet (40 mg total) by mouth daily. 90 tablet 3   No current facility-administered medications on file prior to visit.    BP (!) 140/80   Pulse 69   Temp 97.6 F (36.4 C) (Oral)   Ht 5\' 7"  (1.702 m)   Wt 175 lb (79.4 kg)   SpO2 97%   BMI 27.41 kg/m       Objective:   Physical Exam Vitals and nursing note reviewed.  Constitutional:      Appearance: Normal appearance.  Cardiovascular:     Rate and Rhythm: Normal rate and regular rhythm.     Pulses: Normal pulses.     Heart sounds: Normal heart sounds.  Pulmonary:     Effort: Pulmonary effort is normal.     Breath sounds: Normal breath sounds.  Musculoskeletal:     Right lower leg: Right lower leg edema: trace edema to right ankle.     Right ankle: Swelling present. Tenderness present over the lateral malleolus and medial malleolus. Decreased range of motion.     Comments: He does have trace edema at the medial malleolus. No redness noted but is warm to touch. Burning pain radiating to tib fib with palpation.   Skin:    General: Skin is warm and dry.  Neurological:     General: No focal deficit present.     Mental Status: He is alert and oriented to person, place, and time.  Psychiatric:        Mood and Affect: Mood normal.        Behavior: Behavior normal.        Thought Content: Thought content normal.        Judgment: Judgment normal.       Assessment & Plan:  1. Acute right ankle pain (Primary) ? Gout vs arthritis vs tarsal tunnel syndrome? Will do xray today and check uric acid. Start on low dose steroids over thr weekend.  - Uric Acid; Future - DG  Foot Complete Right; Future - DG Tibia/Fibula Right; Future - predniSONE (DELTASONE) 10 MG tablet; Take 1 tablet (10 mg total) by mouth daily with breakfast.  Dispense: 5 tablet; Refill: 0  2. Rectal bleeding I am happy that it has resolved but I agree on keeping GI appointment.   Shirline Frees, NP

## 2024-02-08 ENCOUNTER — Encounter: Payer: Self-pay | Admitting: Adult Health

## 2024-02-08 NOTE — Telephone Encounter (Signed)
 FYI

## 2024-02-10 ENCOUNTER — Other Ambulatory Visit (INDEPENDENT_AMBULATORY_CARE_PROVIDER_SITE_OTHER)

## 2024-02-10 ENCOUNTER — Ambulatory Visit: Admitting: Gastroenterology

## 2024-02-10 ENCOUNTER — Encounter: Payer: Self-pay | Admitting: Gastroenterology

## 2024-02-10 VITALS — BP 118/62 | HR 55 | Ht 67.0 in | Wt 174.0 lb

## 2024-02-10 DIAGNOSIS — K921 Melena: Secondary | ICD-10-CM

## 2024-02-10 DIAGNOSIS — K922 Gastrointestinal hemorrhage, unspecified: Secondary | ICD-10-CM | POA: Diagnosis not present

## 2024-02-10 DIAGNOSIS — D649 Anemia, unspecified: Secondary | ICD-10-CM | POA: Diagnosis not present

## 2024-02-10 LAB — CBC
HCT: 39 % (ref 39.0–52.0)
Hemoglobin: 12.9 g/dL — ABNORMAL LOW (ref 13.0–17.0)
MCHC: 33.2 g/dL (ref 30.0–36.0)
MCV: 88.1 fl (ref 78.0–100.0)
Platelets: 256 10*3/uL (ref 150.0–400.0)
RBC: 4.42 Mil/uL (ref 4.22–5.81)
RDW: 13.6 % (ref 11.5–15.5)
WBC: 6.8 10*3/uL (ref 4.0–10.5)

## 2024-02-10 MED ORDER — PANTOPRAZOLE SODIUM 40 MG PO TBEC: 40.0000 mg | DELAYED_RELEASE_TABLET | Freq: Every day | ORAL | 1 refills | Status: AC

## 2024-02-10 NOTE — Patient Instructions (Addendum)
 _______________________________________________________  If your blood pressure at your visit was 140/90 or greater, please contact your primary care physician to follow up on this. _______________________________________________________  If you are age 85 or older, your body mass index should be between 23-30. Your Body mass index is 27.25 kg/m. If this is out of the aforementioned range listed, please consider follow up with your Primary Care Provider. ________________________________________________________  The [facility] providers would like to encourage you to use [facility] to communicate with providers for non-urgent requests or questions.  Due to long hold times on the telephone, sending your provider a message by [facility] may be a faster and more efficient way to get a response.  Please allow 48 business hours for a response.  Please remember that this is for non-urgent requests.  _______________________________________________________  Your provider has requested that you go to the basement level for lab work before leaving today. Press "B" on the elevator. The lab is located at the first door on the left as you exit the elevator.  We have sent the following medications to your pharmacy for you to pick up at your convenience: CONTINUE: Protonix 40mg  daily  Due to recent changes in healthcare laws, you may see the results of your imaging and laboratory studies on MyChart before your provider has had a chance to review them.  We understand that in some cases there may be results that are confusing or concerning to you. Not all laboratory results come back in the same time frame and the provider may be waiting for multiple results in order to interpret others.  Please give [place] 48 hours in order for your provider to thoroughly review all the results before contacting the office for clarification of your results.   It was a pleasure to see you today!  Vito Cirigliano, D.O.

## 2024-02-10 NOTE — Progress Notes (Signed)
 Chief Complaint: Black stools, normocytic anemia   Referring Provider:     Shirline Frees, NP    HPI:     Ian [name] is a 83 y.o. male with a history of CAD (on ASA 81 mg), carotid stenosis, diabetes, GERD, HTN, HLD, OSA, CKD, Nissen fundoplication, referred to the Gastro[facility]ology Clinic for evaluation of dark stool. Sxs started about 3 weeks ago, lasting 10 days.   He was seen by his PCM for this issue on 01/21/2024 and was started on Protonix 40 mg daily and sucralfate 1 g 4 times daily. Labs notable for normocytic anemia.  - H/H 12.2/37 with MCV/RDW 89/14 (baseline ~13-15) - Ferritin 36.8, iron 53, TIBC 373, sat 14%  Today, he states have since resolved. He stopped his ASA 81 mg. Last episode was ~2 weeks ago. No associated abdominal pain, nausea/vomiting. No preceding changes in medications, diet, etc. No hematochezia. No CP, lightheadedness, SOB, DOE. No prior similar sxs.   Aside from ASA 81 mg, was not taking any other NSAIDs.  EGD 20+ years ago for GERD and Nissen fundoplication work-up. Last colonoscopy was <10 years ago and normal per patient.       Latest Ref Rng & Units 01/21/2024    2:09 PM 03/05/2023    9:50 AM 03/17/2022    4:16 PM  CBC  WBC 4.0 - 10.5 K/uL 8.9  8.1  7.4   Hemoglobin 13.0 - 17.0 g/dL 16.1  09.6  04.5   Hematocrit 39.0 - 52.0 % 37.1  46.5  40.0   Platelets 150.0 - 400.0 K/uL 241.0  220.0  195.0       Past Medical History:  Diagnosis Date   Allergy    Apnea, sleep 07/28/2007   NPSG [place], AHI 79.9   Carotid artery stenosis    mild 04/2010, 05/2012   Coronary heart disease 11/09/2006   Cath 2008, 60-70% proximal diagonal 1 stenosis,  RCA 40% stenosis.  nonischemic Lexiscan 03/2011   Diabetes mellitus    type II   Elevated cholesterol    GERD (gastroesophageal reflux disease)    Hyperlipidemia    Hypertension    Hypospadias      Past Surgical History:  Procedure Laterality Date   EYE SURGERY     NASAL SINUS  SURGERY     NISSEN FUNDOPLICATION     TONSILLECTOMY     VEIN LIGATION AND STRIPPING     Family History  Problem Relation Age of Onset   Dementia Mother    Coronary artery disease Father 32       CABG   Liver disease Neg Hx    Colon cancer Neg Hx    Esophageal cancer Neg Hx    Social History   Tobacco Use   Smoking status: Never   Smokeless tobacco: Never  Vaping Use   Vaping status: Never Used  Substance Use Topics   Alcohol use: No    Alcohol/week: 0.0 standard drinks of alcohol   Drug use: No   Current Outpatient Medications  Medication Sig Dispense Refill   Cholecalciferol (VITAMIN D3 PO) Take 10,000 Units by mouth once a week.     clotrimazole-betamethasone (LOTRISONE) cream Apply 1 Application topically daily. 30 g 0   Coenzyme Q10 (CO Q10) 100 MG CAPS Take 1 tablet by mouth daily.     FARXIGA 10 MG TABS tablet Take 10 mg by mouth daily.     gabapentin (  NEURONTIN) 300 MG capsule TAKE 1 TO 2 CAPSULES IN THE EVENING 180 capsule 3   glipiZIDE (GLUCOTROL XL) 2.5 MG 24 hr tablet TAKE 1 TABLET DAILY WITH BREAKFAST 90 tablet 3   GLUCOSAMINE-CHONDROITIN PO Take by mouth.     hydrocortisone 2.5 % cream Apply topically 2 (two) times daily. 453.6 g 0   Lancets (ONETOUCH ULTRASOFT) lancets USE TO TEST BLOOD GLUCOSE TWICE DAILY 200 each 3   nitroGLYCERIN (NITROSTAT) 0.4 MG SL tablet DISSOLVE 1 TABLET UNDER THE TONGUE EVERY 5 MINUTES AS NEEDED FOR CHEST PAIN 75 tablet 14   olmesartan (BENICAR) 20 MG tablet TAKE 1 TABLET DAILY 90 tablet 3   Omega-3 Fatty Acids (FISH OIL ULTRA) 1400 MG CAPS Take by mouth.     pantoprazole (PROTONIX) 40 MG tablet Take 1 tablet (40 mg total) by mouth daily. 90 tablet 0   sucralfate (CARAFATE) 1 g tablet Take 1 tablet (1 g total) by mouth 4 (four) times daily -  with meals and at bedtime. 120 tablet 0   tamsulosin (FLOMAX) 0.4 MG CAPS capsule Take 1 capsule (0.4 mg total) by mouth daily. 90 capsule 3   traMADol (ULTRAM) 50 MG tablet TAKE 1 TABLET EVERY 8  HOURS AS NEEDED 90 tablet 2   zolpidem (AMBIEN) 5 MG tablet TAKE 1 TABLET AT BEDTIME AS NEEDED FOR SLEEP 90 tablet 0   aspirin 81 MG tablet Take 1 tablet (81 mg total) by mouth daily. (Patient not taking: Reported on 02/10/2024) 90 tablet 3   clindamycin (CLEOCIN T) 1 % external solution SMARTSIG:Topical Twice a Week PRN (Patient not taking: Reported on 02/10/2024)     econazole nitrate 1 % cream Apply topically daily. 15 g 2   fluticasone (FLONASE) 50 MCG/ACT nasal spray USE 2 SPRAYS IN EACH NOSTRIL DAILY (Patient not taking: Reported on 02/10/2024) 48 g 3   predniSONE (DELTASONE) 10 MG tablet Take 1 tablet (10 mg total) by mouth daily with breakfast. (Patient not taking: Reported on 02/10/2024) 5 tablet 0   rosuvastatin (CRESTOR) 40 MG tablet Take 1 tablet (40 mg total) by mouth daily. 90 tablet 3   SYSTANE ULTRA 0.4-0.3 % SOLN  (Patient not taking: Reported on 02/10/2024)     No current facility-administered medications for this visit.   Allergies  Allergen Reactions   Iohexol      Desc: pt. states severe reaction, dr. told patient never to have the dye again      Review of Systems: All systems reviewed and negative except where noted in HPI.     Physical Exam:    Wt Readings from Last 3 Encounters:  02/10/24 174 lb (78.9 kg)  02/04/24 175 lb (79.4 kg)  01/21/24 172 lb (78 kg)    BP 118/62   Pulse (!) 55   Ht 5\' 7"  (1.702 m)   Wt 174 lb (78.9 kg)   BMI 27.25 kg/m  Constitutional:  Pleasant, in no acute distress. Psychiatric: Normal mood and affect. Behavior is normal. Cardiovascular: Normal rate, regular rhythm. No edema Pulmonary/chest: Effort normal and breath sounds normal. No wheezing, rales or rhonchi. Abdominal: Soft, nondistended, nontender. Bowel sounds active throughout. There are no masses palpable. No hepatomegaly. Neurological: Alert and oriented to person place and time. Skin: Skin is warm and dry. No rashes noted.   ASSESSMENT AND PLAN;   1) Melena 2)  Normocytic anemia 83 year old male with recent onset melena and normocytic anemia.  Discussed DDx today to include peptic ulcer disease,,gastritis, etc.  We also  discussed the possibility of malignant source for bleeding, although labs with normocytic anemia and largely unremarkable iron indices.  We discussed the role/utility of endoscopic evaluation for diagnostic and potentially therapeutic intent.  Given resolution of symptoms with appropriate medical therapy, along with age and comorbidities, he would like to proceed with more conservative route as follows:  - Continue Protonix 40 mg daily - Placed refill for Protonix today - Check CBC today - If H/H uptrending, will stop Carafate and continue with conservative management - If H/H downtrending, will plan on EGD    Shellia Cleverly, DO, FACG  02/10/2024, 1:33 PM   Nafziger, Kandee Keen, NP

## 2024-02-11 ENCOUNTER — Other Ambulatory Visit: Payer: Self-pay

## 2024-02-11 ENCOUNTER — Other Ambulatory Visit: Payer: Self-pay | Admitting: Adult Health

## 2024-02-11 DIAGNOSIS — N401 Enlarged prostate with lower urinary tract symptoms: Secondary | ICD-10-CM

## 2024-02-11 DIAGNOSIS — K921 Melena: Secondary | ICD-10-CM

## 2024-02-11 DIAGNOSIS — K922 Gastrointestinal hemorrhage, unspecified: Secondary | ICD-10-CM

## 2024-02-11 DIAGNOSIS — D649 Anemia, unspecified: Secondary | ICD-10-CM

## 2024-02-15 ENCOUNTER — Encounter: Payer: Self-pay | Admitting: Adult Health

## 2024-02-22 ENCOUNTER — Encounter: Payer: Self-pay | Admitting: Adult Health

## 2024-02-23 ENCOUNTER — Other Ambulatory Visit: Payer: Self-pay | Admitting: Adult Health

## 2024-02-23 DIAGNOSIS — Z760 Encounter for issue of repeat prescription: Secondary | ICD-10-CM

## 2024-02-25 ENCOUNTER — Encounter: Payer: Self-pay | Admitting: Adult Health

## 2024-02-28 ENCOUNTER — Encounter: Payer: Self-pay | Admitting: Adult Health

## 2024-02-28 ENCOUNTER — Encounter: Payer: Self-pay | Admitting: Cardiology

## 2024-02-28 DIAGNOSIS — E785 Hyperlipidemia, unspecified: Secondary | ICD-10-CM

## 2024-02-28 DIAGNOSIS — I2510 Atherosclerotic heart disease of native coronary artery without angina pectoris: Secondary | ICD-10-CM

## 2024-02-28 DIAGNOSIS — R21 Rash and other nonspecific skin eruption: Secondary | ICD-10-CM

## 2024-02-29 NOTE — Telephone Encounter (Signed)
 Please see other note.

## 2024-03-01 NOTE — Telephone Encounter (Signed)
Lipid profile entered

## 2024-03-06 DIAGNOSIS — E1122 Type 2 diabetes mellitus with diabetic chronic kidney disease: Secondary | ICD-10-CM | POA: Diagnosis not present

## 2024-03-06 DIAGNOSIS — N2581 Secondary hyperparathyroidism of renal origin: Secondary | ICD-10-CM | POA: Diagnosis not present

## 2024-03-06 DIAGNOSIS — N189 Chronic kidney disease, unspecified: Secondary | ICD-10-CM | POA: Diagnosis not present

## 2024-03-06 DIAGNOSIS — N1831 Chronic kidney disease, stage 3a: Secondary | ICD-10-CM | POA: Diagnosis not present

## 2024-03-09 DIAGNOSIS — N1831 Chronic kidney disease, stage 3a: Secondary | ICD-10-CM | POA: Diagnosis not present

## 2024-03-09 DIAGNOSIS — I129 Hypertensive chronic kidney disease with stage 1 through stage 4 chronic kidney disease, or unspecified chronic kidney disease: Secondary | ICD-10-CM | POA: Diagnosis not present

## 2024-03-09 DIAGNOSIS — E785 Hyperlipidemia, unspecified: Secondary | ICD-10-CM | POA: Diagnosis not present

## 2024-03-09 DIAGNOSIS — K922 Gastrointestinal hemorrhage, unspecified: Secondary | ICD-10-CM | POA: Diagnosis not present

## 2024-03-09 DIAGNOSIS — D631 Anemia in chronic kidney disease: Secondary | ICD-10-CM | POA: Diagnosis not present

## 2024-03-09 DIAGNOSIS — E1122 Type 2 diabetes mellitus with diabetic chronic kidney disease: Secondary | ICD-10-CM | POA: Diagnosis not present

## 2024-03-09 NOTE — Progress Notes (Signed)
 Cardiology Office Note:   Date:  03/10/2024  ID:  Ian [name], DOB 1941-05-08, MRN [id number] PCP: Alto Atta, NP  [facility] Providers Cardiologist:  Eilleen Grates, MD {  History of Present Illness:   EMILO [name] is a 83 y.o. male  who presents for follow up of CAD. He had a heart catheterization in 2018.  He had a well-preserved ejection fraction.  He had a left main 30% stenosis.  He had some mild plaque in his LAD with a 60 to 70% lesion in the first diagonal.  He had a small nondominant right.  There is no mention of any significant circumflex stenosis.  He had a stress perfusion study in 2010.  Demonstrated no significant abnormalities.  Another stress study in 2012 was unremarkable.  Stress echo in 2014 and most recently in 2019 were both unremarkable.  He is sinus bradycardia.  He had right bundle branch block.  Is been followed for very mild carotid plaque.  He had a POET (Plain Old Exercise Treadmill) in March 2022    Since I last saw him he has done well.  He still drives to [place] .  He walks daily.  This is about a mile.  He gets a little dyspneic walking uphill but this is just slightly progressive.  He is not having any of the chest discomfort that he had previously.  He has no neck or arm discomfort.  He is not having any resting shortness of breath, PND or orthopnea.  He has had no palpitations, presyncope or syncope.  He has had no weight gain or edema.   ROS: As stated in the HPI and negative for all other systems.  Studies Reviewed:    EKG:   EKG Interpretation Date/Time:  Friday Mar 10 2024 11:28:26 EDT Ventricular Rate:  49 PR Interval:  198 QRS Duration:  124 QT Interval:  482 QTC Calculation: 435 R Axis:   3  Text Interpretation: Sinus bradycardia Right bundle branch block When compared with ECG of  11/20/22 No significant change since last tracing Confirmed by Eilleen Grates ([id number]) on 03/10/2024 11:37:06 AM    Risk Assessment/Calculations:       Physical Exam:   VS:  BP (!) 142/56 (BP Location: Right Arm, Patient Position: Sitting, Cuff Size: Normal)   Pulse (!) 53   Ht 5\' 7"  (1.702 m)   Wt 172 lb 9.6 oz (78.3 kg)   SpO2 97%   BMI 27.03 kg/m    Wt Readings from Last 3 Encounters:  03/10/24 172 lb 9.6 oz (78.3 kg)  02/10/24 174 lb (78.9 kg)  02/04/24 175 lb (79.4 kg)     GEN: Well nourished, well developed in no acute distress NECK: No JVD; No carotid bruits CARDIAC: RRR, very soft right upper sternal border systolic murmur, no diastolic murmurs, rubs, gallops RESPIRATORY:  Clear to auscultation without rales, wheezing or rhonchi  ABDOMEN: Soft, non-tender, non-distended EXTREMITIES:  No edema; No deformity   ASSESSMENT AND PLAN:   CAD:  The patient has no new sypmtoms.  No further cardiovascular testing is indicated.  We will continue with aggressive risk reduction and meds as listed.   HTN:   The blood pressure is mildly elevated.  However, this is unusual.  No change in therapy.  DYSLIPIDEMIA:   LDL was 60.  HDL was 60.  I will check a lipid profile.  These labs were from last year.  I would like his LDL to be in the 70s.  DM:  His A1c was 6.0 per his primary.  No change in therapy.    CKD:   Creatinine was checked recently by his primary  SLEEP APNEA:  He is treated fro this.       Follow up with me in one year.   Signed, Eilleen Grates, MD

## 2024-03-10 ENCOUNTER — Encounter: Payer: Self-pay | Admitting: Cardiology

## 2024-03-10 ENCOUNTER — Ambulatory Visit: Attending: Cardiology | Admitting: Cardiology

## 2024-03-10 VITALS — BP 142/56 | HR 53 | Ht 67.0 in | Wt 172.6 lb

## 2024-03-10 DIAGNOSIS — N182 Chronic kidney disease, stage 2 (mild): Secondary | ICD-10-CM | POA: Diagnosis not present

## 2024-03-10 DIAGNOSIS — I10 Essential (primary) hypertension: Secondary | ICD-10-CM | POA: Diagnosis not present

## 2024-03-10 DIAGNOSIS — E118 Type 2 diabetes mellitus with unspecified complications: Secondary | ICD-10-CM | POA: Diagnosis not present

## 2024-03-10 DIAGNOSIS — I2510 Atherosclerotic heart disease of native coronary artery without angina pectoris: Secondary | ICD-10-CM | POA: Diagnosis not present

## 2024-03-10 DIAGNOSIS — E785 Hyperlipidemia, unspecified: Secondary | ICD-10-CM | POA: Diagnosis not present

## 2024-03-10 NOTE — Patient Instructions (Signed)
 Medication Instructions:  Your physician recommends that you continue on your current medications as directed. Please refer to the Current Medication list given to you today.  *If you need a refill on your cardiac medications before your next appointment, please call your pharmacy*  Lab Work: FLP, Lpa today If you have labs (blood work) drawn today and your tests are completely normal, you will receive your results only by: MyChart Message (if you have MyChart) OR A paper copy in the mail If you have any lab test that is abnormal or we need to change your treatment, we will call you to review the results.  Testing/Procedures: NONE  Follow-Up: At [facility], you and your health needs are our priority.  As part of our continuing mission to provide you with exceptional heart care, our providers are all part of one team.  This team includes your primary Cardiologist (physician) and Advanced Practice Providers or APPs (Physician Assistants and Nurse Practitioners) who all work together to provide you with the care you need, when you need it.  Your next appointment:   1 year(s)  Provider:   Eilleen Grates, MD    We recommend signing up for the patient portal called "MyChart".  Sign up information is provided on this After Visit Summary.  MyChart is used to connect with patients for Virtual Visits (Telemedicine).  Patients are able to view lab/test results, encounter notes, upcoming appointments, etc.  Non-urgent messages can be sent to your provider as well.   To learn more about what you can do with MyChart, go to [email or web address].   Other Instructions

## 2024-03-11 LAB — LIPID PANEL
Chol/HDL Ratio: 2.7 ratio (ref 0.0–5.0)
Cholesterol, Total: 161 mg/dL (ref 100–199)
HDL: 60 mg/dL (ref >39)
LDL Chol Calc (NIH): 86 mg/dL (ref 0–99)
Triglycerides: 79 mg/dL (ref 0–149)
VLDL Cholesterol Cal: 15 mg/dL (ref 5–40)

## 2024-03-11 LAB — LIPOPROTEIN A (LPA): Lipoprotein (a): 15.8 nmol/L (ref <75.0)

## 2024-03-13 ENCOUNTER — Encounter: Payer: Self-pay | Admitting: *Deleted

## 2024-03-13 DIAGNOSIS — E785 Hyperlipidemia, unspecified: Secondary | ICD-10-CM

## 2024-03-14 ENCOUNTER — Other Ambulatory Visit: Payer: Self-pay | Admitting: *Deleted

## 2024-03-14 ENCOUNTER — Other Ambulatory Visit (INDEPENDENT_AMBULATORY_CARE_PROVIDER_SITE_OTHER)

## 2024-03-14 DIAGNOSIS — D649 Anemia, unspecified: Secondary | ICD-10-CM

## 2024-03-14 DIAGNOSIS — K922 Gastrointestinal hemorrhage, unspecified: Secondary | ICD-10-CM | POA: Diagnosis not present

## 2024-03-14 DIAGNOSIS — L738 Other specified follicular disorders: Secondary | ICD-10-CM | POA: Diagnosis not present

## 2024-03-14 DIAGNOSIS — B356 Tinea cruris: Secondary | ICD-10-CM | POA: Diagnosis not present

## 2024-03-14 DIAGNOSIS — E785 Hyperlipidemia, unspecified: Secondary | ICD-10-CM

## 2024-03-14 DIAGNOSIS — K921 Melena: Secondary | ICD-10-CM

## 2024-03-14 DIAGNOSIS — H40013 Open angle with borderline findings, low risk, bilateral: Secondary | ICD-10-CM | POA: Diagnosis not present

## 2024-03-14 DIAGNOSIS — L98499 Non-pressure chronic ulcer of skin of other sites with unspecified severity: Secondary | ICD-10-CM | POA: Diagnosis not present

## 2024-03-14 LAB — CBC
HCT: 40.4 % (ref 39.0–52.0)
Hemoglobin: 13.1 g/dL (ref 13.0–17.0)
MCHC: 32.4 g/dL (ref 30.0–36.0)
MCV: 85.3 fl (ref 78.0–100.0)
Platelets: 233 10*3/uL (ref 150.0–400.0)
RBC: 4.74 Mil/uL (ref 4.22–5.81)
RDW: 14.2 % (ref 11.5–15.5)
WBC: 6.2 10*3/uL (ref 4.0–10.5)

## 2024-03-14 LAB — BASIC METABOLIC PANEL WITH GFR
BUN: 34 mg/dL — ABNORMAL HIGH (ref 6–23)
CO2: 24 meq/L (ref 19–32)
Calcium: 9.2 mg/dL (ref 8.4–10.5)
Chloride: 107 meq/L (ref 96–112)
Creatinine, Ser: 1.45 mg/dL (ref 0.40–1.50)
GFR: 44.16 mL/min — ABNORMAL LOW (ref >60.00)
Glucose, Bld: 95 mg/dL (ref 70–99)
Potassium: 4.3 meq/L (ref 3.5–5.1)
Sodium: 138 meq/L (ref 135–145)

## 2024-03-14 LAB — HM DIABETES EYE EXAM

## 2024-03-14 MED ORDER — EZETIMIBE 10 MG PO TABS
10.0000 mg | ORAL_TABLET | Freq: Every day | ORAL | 3 refills | Status: DC
Start: 2024-03-14 — End: 2024-04-20

## 2024-03-15 ENCOUNTER — Encounter: Payer: Self-pay | Admitting: Gastroenterology

## 2024-03-28 ENCOUNTER — Encounter: Payer: Self-pay | Admitting: Cardiology

## 2024-03-28 ENCOUNTER — Ambulatory Visit (INDEPENDENT_AMBULATORY_CARE_PROVIDER_SITE_OTHER): Admitting: Adult Health

## 2024-03-28 ENCOUNTER — Encounter: Payer: Self-pay | Admitting: Adult Health

## 2024-03-28 VITALS — BP 120/60 | HR 67 | Temp 97.8°F | Ht 67.0 in | Wt 173.0 lb

## 2024-03-28 DIAGNOSIS — L98499 Non-pressure chronic ulcer of skin of other sites with unspecified severity: Secondary | ICD-10-CM | POA: Diagnosis not present

## 2024-03-28 DIAGNOSIS — G8929 Other chronic pain: Secondary | ICD-10-CM | POA: Diagnosis not present

## 2024-03-28 DIAGNOSIS — M25511 Pain in right shoulder: Secondary | ICD-10-CM

## 2024-03-28 DIAGNOSIS — N481 Balanitis: Secondary | ICD-10-CM | POA: Diagnosis not present

## 2024-03-28 DIAGNOSIS — M25512 Pain in left shoulder: Secondary | ICD-10-CM | POA: Diagnosis not present

## 2024-03-28 MED ORDER — METHYLPREDNISOLONE ACETATE 80 MG/ML IJ SUSP
80.0000 mg | Freq: Once | INTRAMUSCULAR | Status: AC
  Administered 2024-03-28: 80 mg via INTRA_ARTICULAR

## 2024-03-28 NOTE — Progress Notes (Signed)
 Subjective:    Patient ID: Ian [name], male    DOB: 02/07/1941, 83 y.o.   MRN: [id number]  Shoulder Pain    83 year old male who  has a past medical history of Allergy, Apnea, sleep (07/28/2007), Carotid artery stenosis, Coronary heart disease (11/09/2006), Diabetes mellitus, Elevated cholesterol, GERD (gastroesophageal reflux disease), Hyperlipidemia, Hypertension, and Hypospadias.  He presents to the office today for chronic bilateral shoulder pain. He has tolerated shoulder injections well and does get significant pain relief. He is getting ready to go to [place]  for the summer and would like to have his shoulders injected.    Review of Systems See HPI   Past Medical History:  Diagnosis Date   Allergy    Apnea, sleep 07/28/2007   NPSG [place] , AHI 79.9   Carotid artery stenosis    mild 04/2010, 05/2012   Coronary heart disease 11/09/2006   Cath 2008, 60-70% proximal diagonal 1 stenosis,  RCA 40% stenosis.  nonischemic Lexiscan 03/2011   Diabetes mellitus    type II   Elevated cholesterol    GERD (gastroesophageal reflux disease)    Hyperlipidemia    Hypertension    Hypospadias     Social History   Socioeconomic History   Marital status: Married    Spouse name: Not on file   Number of children: 3   Years of education: Not on file   Highest education level: Master's degree (e.g., MA, MS, MEng, MEd, MSW, MBA)  Occupational History   Occupation: Retired Economist: RETIRED  Tobacco Use   Smoking status: Never   Smokeless tobacco: Never  Vaping Use   Vaping status: Never Used  Substance and Sexual Activity   Alcohol use: No    Alcohol/week: 0.0 standard drinks of alcohol   Drug use: No   Sexual activity: Not on file  Other Topics Concern   Not on file  Social History Narrative   Married.  Three children from first marriage.  First wife died of ovarian cancer.     Retired, did work for [employer] for 44 years.    Social Drivers of  Corporate investment banker Strain: Low Risk  (11/18/2023)   Overall Financial Resource Strain (CARDIA)    Difficulty of Paying Living Expenses: Not hard at all  Food Insecurity: No Food Insecurity (11/18/2023)   Hunger Vital Sign    Worried About Running Out of Food in the Last Year: Never true    Ran Out of Food in the Last Year: Never true  Transportation Needs: No Transportation Needs (11/18/2023)   PRAPARE - Administrator, Civil Service (Medical): No    Lack of Transportation (Non-Medical): No  Physical Activity: Insufficiently Active (11/18/2023)   Exercise Vital Sign    Days of Exercise per Week: 3 days    Minutes of Exercise per Session: 40 min  Stress: No Stress Concern Present (11/18/2023)   [employer] of Occupational Health - Occupational Stress Questionnaire    Feeling of Stress : Only a little  Social Connections: Socially Integrated (11/18/2023)   Social Connection and Isolation Panel [NHANES]    Frequency of Communication with Friends and Family: More than three times a week    Frequency of Social Gatherings with Friends and Family: Once a week    Attends Religious Services: More than 4 times per year    Active Member of [employer] or Organizations: Yes    Attends Banker Meetings:  More than 4 times per year    Marital Status: Married  Intimate Partner Violence: Unknown (12/24/2022)   Received [facility]   HITS    Physically Hurt: Not on file    Insult or Talk Down To: Not on file    Threaten Physical Harm: Not on file    Scream or Curse: Not on file    Past Surgical History:  Procedure Laterality Date   EYE SURGERY     NASAL SINUS SURGERY     NISSEN FUNDOPLICATION     TONSILLECTOMY     VEIN LIGATION AND STRIPPING      Family History  Problem Relation Age of Onset   Dementia Mother    Coronary artery disease Father 28       CABG   Liver disease Neg Hx    Colon cancer Neg Hx    Esophageal cancer Neg Hx      Allergies  Allergen Reactions   Iohexol      Desc: pt. states severe reaction, dr. told patient never to have the dye again     Current Outpatient Medications on File Prior to Visit  Medication Sig Dispense Refill   aspirin  81 MG tablet Take 1 tablet (81 mg total) by mouth daily. 90 tablet 3   Cholecalciferol (VITAMIN D3 PO) Take 10,000 Units by mouth once a week.     clindamycin (CLEOCIN T) 1 % external solution      clotrimazole -betamethasone  (LOTRISONE ) cream Apply 1 Application topically daily. 30 g 0   Coenzyme Q10 (CO Q10) 100 MG CAPS Take 1 tablet by mouth daily.     econazole nitrate  1 % cream Apply topically daily. 15 g 2   ezetimibe  (ZETIA ) 10 MG tablet Take 1 tablet (10 mg total) by mouth daily. 90 tablet 3   FARXIGA 10 MG TABS tablet Take 10 mg by mouth daily.     fluticasone  (FLONASE ) 50 MCG/ACT nasal spray USE 2 SPRAYS IN EACH NOSTRIL DAILY 48 g 3   gabapentin  (NEURONTIN ) 300 MG capsule TAKE 1 TO 2 CAPSULES IN THE EVENING 180 capsule 3   glipiZIDE  (GLUCOTROL  XL) 2.5 MG 24 hr tablet TAKE 1 TABLET DAILY WITH BREAKFAST 90 tablet 3   GLUCOSAMINE-CHONDROITIN PO Take by mouth.     hydrocortisone  2.5 % cream Apply topically 2 (two) times daily. 453.6 g 0   Lancets (ONETOUCH ULTRASOFT) lancets USE TO TEST BLOOD GLUCOSE TWICE DAILY 200 each 3   Magnesium 400 MG TABS Take by mouth.     mupirocin cream (BACTROBAN) 2 % Apply 1 Application topically 2 (two) times daily.     nitroGLYCERIN  (NITROSTAT ) 0.4 MG SL tablet DISSOLVE 1 TABLET UNDER THE TONGUE EVERY 5 MINUTES AS NEEDED FOR CHEST PAIN 75 tablet 14   olmesartan  (BENICAR ) 20 MG tablet TAKE 1 TABLET DAILY 90 tablet 3   Omega-3 Fatty Acids (FISH OIL  ULTRA) 1400 MG CAPS Take by mouth.     pantoprazole  (PROTONIX ) 40 MG tablet Take 1 tablet (40 mg total) by mouth daily. 90 tablet 1   predniSONE  (DELTASONE ) 10 MG tablet Take 1 tablet (10 mg total) by mouth daily with breakfast. 5 tablet 0   SYSTANE ULTRA 0.4-0.3 % SOLN       tamsulosin  (FLOMAX ) 0.4 MG CAPS capsule TAKE 1 CAPSULE DAILY 90 capsule 3   terbinafine  (LAMISIL ) 250 MG tablet Take 250 mg by mouth daily.     traMADol  (ULTRAM ) 50 MG tablet TAKE 1 TABLET EVERY 8  HOURS AS NEEDED 90 tablet 2   zolpidem  (AMBIEN ) 5 MG tablet TAKE 1 TABLET AT BEDTIME AS NEEDED FOR SLEEP 90 tablet 0   rosuvastatin  (CRESTOR ) 40 MG tablet Take 1 tablet (40 mg total) by mouth daily. 90 tablet 3   sucralfate  (CARAFATE ) 1 g tablet Take 1 tablet (1 g total) by mouth 4 (four) times daily -  with meals and at bedtime. (Patient not taking: Reported on 03/10/2024) 120 tablet 0   No current facility-administered medications on file prior to visit.    BP 120/60   Pulse 67   Temp 97.8 F (36.6 C) (Oral)   Ht 5\' 7"  (1.702 m)   Wt 173 lb (78.5 kg)   SpO2 97%   BMI 27.10 kg/m       Objective:   Physical Exam Vitals and nursing note reviewed.  Constitutional:      Appearance: Normal appearance.  Cardiovascular:     Rate and Rhythm: Normal rate and regular rhythm.     Pulses: Normal pulses.     Heart sounds: Normal heart sounds.  Musculoskeletal:        General: Normal range of motion.  Skin:    General: Skin is warm and dry.  Neurological:     General: No focal deficit present.     Mental Status: He is alert and oriented to person, place, and time.  Psychiatric:        Mood and Affect: Mood normal.        Behavior: Behavior normal.        Thought Content: Thought content normal.        Judgment: Judgment normal.        Assessment & Plan:   1. Chronic right shoulder pain (Primary) Shoulder injection Verbal consent obtained and verified. Sterile betadine prep. Furthur cleansed with alcohol. Topical analgesic spray: Ethyl chloride. Joint: right subacromial injection Approached in typical fashion with: posterior approach Completed without difficulty Meds: 3 cc lidocaine 2% no epi, 1 cc depomedrol 80mg /cc Needle:1.5 inch 25 gauge Aftercare instructions and Red flags  advised. Immediate improvement in pain noted  - methylPREDNISolone  acetate (DEPO-MEDROL ) injection 80 mg  2. Chronic left shoulder pain Shoulder injection Verbal consent obtained and verified. Sterile betadine prep. Furthur cleansed with alcohol. Topical analgesic spray: Ethyl chloride. Joint: left subacromial injection Approached in typical fashion with: posterior approach Completed without difficulty Meds: 3 cc lidocaine 2% no epi, 1 cc depomedrol 80mg /cc Needle:1.5 inch 25 gauge Aftercare instructions and Red flags advised. Immediate improvement in pain noted  - methylPREDNISolone  acetate (DEPO-MEDROL ) injection 80 mg   Milt Coye, NP

## 2024-04-19 ENCOUNTER — Encounter: Payer: Self-pay | Admitting: Cardiology

## 2024-04-19 DIAGNOSIS — E785 Hyperlipidemia, unspecified: Secondary | ICD-10-CM

## 2024-04-20 MED ORDER — EZETIMIBE 10 MG PO TABS: 10.0000 mg | ORAL_TABLET | Freq: Every day | ORAL | 3 refills | Status: AC

## 2024-04-22 ENCOUNTER — Other Ambulatory Visit: Payer: Self-pay | Admitting: Adult Health

## 2024-04-23 ENCOUNTER — Other Ambulatory Visit: Payer: Self-pay | Admitting: Adult Health

## 2024-04-23 DIAGNOSIS — Z760 Encounter for issue of repeat prescription: Secondary | ICD-10-CM

## 2024-04-24 NOTE — Telephone Encounter (Signed)
 Okay for refill?

## 2024-04-26 ENCOUNTER — Other Ambulatory Visit: Payer: Self-pay | Admitting: Adult Health

## 2024-04-26 DIAGNOSIS — E119 Type 2 diabetes mellitus without complications: Secondary | ICD-10-CM

## 2024-05-05 DIAGNOSIS — Z125 Encounter for screening for malignant neoplasm of prostate: Secondary | ICD-10-CM | POA: Diagnosis not present

## 2024-05-05 DIAGNOSIS — Z0000 Encounter for general adult medical examination without abnormal findings: Secondary | ICD-10-CM | POA: Diagnosis not present

## 2024-05-05 DIAGNOSIS — B372 Candidiasis of skin and nail: Secondary | ICD-10-CM | POA: Diagnosis not present

## 2024-05-13 ENCOUNTER — Encounter: Payer: Self-pay | Admitting: Adult Health

## 2024-05-13 DIAGNOSIS — B359 Dermatophytosis, unspecified: Secondary | ICD-10-CM

## 2024-05-17 ENCOUNTER — Encounter: Payer: Self-pay | Admitting: Adult Health

## 2024-05-17 DIAGNOSIS — I739 Peripheral vascular disease, unspecified: Secondary | ICD-10-CM | POA: Diagnosis not present

## 2024-05-17 DIAGNOSIS — E782 Mixed hyperlipidemia: Secondary | ICD-10-CM | POA: Diagnosis not present

## 2024-05-17 DIAGNOSIS — G4733 Obstructive sleep apnea (adult) (pediatric): Secondary | ICD-10-CM | POA: Diagnosis not present

## 2024-05-17 DIAGNOSIS — I2510 Atherosclerotic heart disease of native coronary artery without angina pectoris: Secondary | ICD-10-CM | POA: Diagnosis not present

## 2024-05-17 DIAGNOSIS — I6523 Occlusion and stenosis of bilateral carotid arteries: Secondary | ICD-10-CM | POA: Diagnosis not present

## 2024-05-17 MED ORDER — CLOTRIMAZOLE-BETAMETHASONE 1-0.05 % EX CREA: 1.0000 | TOPICAL_CREAM | Freq: Every day | CUTANEOUS | 0 refills | Status: DC

## 2024-05-19 ENCOUNTER — Encounter: Payer: Self-pay | Admitting: Adult Health

## 2024-05-19 ENCOUNTER — Encounter: Payer: Self-pay | Admitting: Cardiology

## 2024-05-19 NOTE — Telephone Encounter (Signed)
 FYI

## 2024-05-26 DIAGNOSIS — M25562 Pain in left knee: Secondary | ICD-10-CM | POA: Diagnosis not present

## 2024-05-26 DIAGNOSIS — N183 Chronic kidney disease, stage 3 unspecified: Secondary | ICD-10-CM | POA: Diagnosis not present

## 2024-05-26 DIAGNOSIS — B379 Candidiasis, unspecified: Secondary | ICD-10-CM | POA: Diagnosis not present

## 2024-06-18 ENCOUNTER — Encounter: Payer: Self-pay | Admitting: Adult Health

## 2024-06-18 DIAGNOSIS — B359 Dermatophytosis, unspecified: Secondary | ICD-10-CM

## 2024-06-20 MED ORDER — HYDROCORTISONE 2.5 % EX CREA: TOPICAL_CREAM | Freq: Two times a day (BID) | CUTANEOUS | 1 refills | Status: AC

## 2024-06-25 ENCOUNTER — Encounter: Payer: Self-pay | Admitting: Adult Health

## 2024-06-27 ENCOUNTER — Other Ambulatory Visit: Payer: Self-pay | Admitting: Adult Health

## 2024-06-27 DIAGNOSIS — Z9181 History of falling: Secondary | ICD-10-CM | POA: Diagnosis not present

## 2024-06-27 DIAGNOSIS — E119 Type 2 diabetes mellitus without complications: Secondary | ICD-10-CM | POA: Diagnosis not present

## 2024-06-27 DIAGNOSIS — Z1331 Encounter for screening for depression: Secondary | ICD-10-CM | POA: Diagnosis not present

## 2024-06-27 DIAGNOSIS — N319 Neuromuscular dysfunction of bladder, unspecified: Secondary | ICD-10-CM | POA: Diagnosis not present

## 2024-06-27 DIAGNOSIS — Z0189 Encounter for other specified special examinations: Secondary | ICD-10-CM | POA: Diagnosis not present

## 2024-06-27 DIAGNOSIS — I10 Essential (primary) hypertension: Secondary | ICD-10-CM | POA: Diagnosis not present

## 2024-06-27 DIAGNOSIS — G4700 Insomnia, unspecified: Secondary | ICD-10-CM | POA: Diagnosis not present

## 2024-06-27 DIAGNOSIS — Z7689 Persons encountering health services in other specified circumstances: Secondary | ICD-10-CM | POA: Diagnosis not present

## 2024-06-27 DIAGNOSIS — Z1339 Encounter for screening examination for other mental health and behavioral disorders: Secondary | ICD-10-CM | POA: Diagnosis not present

## 2024-06-27 DIAGNOSIS — Z6828 Body mass index (BMI) 28.0-28.9, adult: Secondary | ICD-10-CM | POA: Diagnosis not present

## 2024-06-27 MED ORDER — ZOLPIDEM TARTRATE 5 MG PO TABS: 5.0000 mg | ORAL_TABLET | Freq: Every evening | ORAL | 0 refills | Status: DC | PRN

## 2024-06-27 NOTE — Telephone Encounter (Signed)
**Note De-identified  [place] Obfuscation** Please advise 

## 2024-07-24 DIAGNOSIS — M7751 Other enthesopathy of right foot: Secondary | ICD-10-CM | POA: Diagnosis not present

## 2024-07-25 ENCOUNTER — Encounter: Payer: Self-pay | Admitting: Adult Health

## 2024-07-26 ENCOUNTER — Other Ambulatory Visit: Payer: Self-pay | Admitting: Adult Health

## 2024-07-26 MED ORDER — ZOLPIDEM TARTRATE 5 MG PO TABS: 5.0000 mg | ORAL_TABLET | Freq: Every evening | ORAL | 0 refills | Status: DC | PRN

## 2024-07-28 ENCOUNTER — Encounter: Payer: Self-pay | Admitting: Adult Health

## 2024-08-01 NOTE — Telephone Encounter (Signed)
**Note De-identified  [place] Obfuscation** Please advise 

## 2024-08-04 DIAGNOSIS — N1831 Chronic kidney disease, stage 3a: Secondary | ICD-10-CM | POA: Diagnosis not present

## 2024-08-04 DIAGNOSIS — E1122 Type 2 diabetes mellitus with diabetic chronic kidney disease: Secondary | ICD-10-CM | POA: Diagnosis not present

## 2024-08-08 DIAGNOSIS — N2581 Secondary hyperparathyroidism of renal origin: Secondary | ICD-10-CM | POA: Diagnosis not present

## 2024-08-08 DIAGNOSIS — E785 Hyperlipidemia, unspecified: Secondary | ICD-10-CM | POA: Diagnosis not present

## 2024-08-08 DIAGNOSIS — N400 Enlarged prostate without lower urinary tract symptoms: Secondary | ICD-10-CM | POA: Diagnosis not present

## 2024-08-08 DIAGNOSIS — I129 Hypertensive chronic kidney disease with stage 1 through stage 4 chronic kidney disease, or unspecified chronic kidney disease: Secondary | ICD-10-CM | POA: Diagnosis not present

## 2024-08-08 DIAGNOSIS — N1831 Chronic kidney disease, stage 3a: Secondary | ICD-10-CM | POA: Diagnosis not present

## 2024-08-08 DIAGNOSIS — E1122 Type 2 diabetes mellitus with diabetic chronic kidney disease: Secondary | ICD-10-CM | POA: Diagnosis not present

## 2024-08-09 ENCOUNTER — Other Ambulatory Visit: Payer: Self-pay | Admitting: Adult Health

## 2024-08-18 ENCOUNTER — Ambulatory Visit: Payer: Self-pay | Admitting: Adult Health

## 2024-08-18 ENCOUNTER — Encounter: Payer: Self-pay | Admitting: Adult Health

## 2024-08-18 ENCOUNTER — Ambulatory Visit (INDEPENDENT_AMBULATORY_CARE_PROVIDER_SITE_OTHER): Admitting: Adult Health

## 2024-08-18 VITALS — BP 130/68 | HR 66 | Temp 97.5°F | Ht 67.0 in | Wt 160.0 lb

## 2024-08-18 DIAGNOSIS — E1140 Type 2 diabetes mellitus with diabetic neuropathy, unspecified: Secondary | ICD-10-CM

## 2024-08-18 DIAGNOSIS — G8929 Other chronic pain: Secondary | ICD-10-CM

## 2024-08-18 DIAGNOSIS — N1831 Chronic kidney disease, stage 3a: Secondary | ICD-10-CM

## 2024-08-18 DIAGNOSIS — R5383 Other fatigue: Secondary | ICD-10-CM

## 2024-08-18 DIAGNOSIS — I10 Essential (primary) hypertension: Secondary | ICD-10-CM | POA: Diagnosis not present

## 2024-08-18 DIAGNOSIS — E119 Type 2 diabetes mellitus without complications: Secondary | ICD-10-CM

## 2024-08-18 DIAGNOSIS — M25561 Pain in right knee: Secondary | ICD-10-CM

## 2024-08-18 DIAGNOSIS — M25562 Pain in left knee: Secondary | ICD-10-CM | POA: Diagnosis not present

## 2024-08-18 DIAGNOSIS — E1122 Type 2 diabetes mellitus with diabetic chronic kidney disease: Secondary | ICD-10-CM | POA: Diagnosis not present

## 2024-08-18 DIAGNOSIS — Z7984 Long term (current) use of oral hypoglycemic drugs: Secondary | ICD-10-CM | POA: Diagnosis not present

## 2024-08-18 LAB — COMPREHENSIVE METABOLIC PANEL WITH GFR
ALT: 19 U/L (ref 0–53)
AST: 24 U/L (ref 0–37)
Albumin: 4.2 g/dL (ref 3.5–5.2)
Alkaline Phosphatase: 46 U/L (ref 39–117)
BUN: 29 mg/dL — ABNORMAL HIGH (ref 6–23)
CO2: 25 meq/L (ref 19–32)
Calcium: 9.1 mg/dL (ref 8.4–10.5)
Chloride: 102 meq/L (ref 96–112)
Creatinine, Ser: 1.29 mg/dL (ref 0.40–1.50)
GFR: 50.66 mL/min — ABNORMAL LOW (ref >60.00)
Glucose, Bld: 235 mg/dL — ABNORMAL HIGH (ref 70–99)
Potassium: 3.9 meq/L (ref 3.5–5.1)
Sodium: 135 meq/L (ref 135–145)
Total Bilirubin: 0.9 mg/dL (ref 0.2–1.2)
Total Protein: 7 g/dL (ref 6.0–8.3)

## 2024-08-18 LAB — CBC
HCT: 47 % (ref 39.0–52.0)
Hemoglobin: 15.3 g/dL (ref 13.0–17.0)
MCHC: 32.5 g/dL (ref 30.0–36.0)
MCV: 86.5 fl (ref 78.0–100.0)
Platelets: 183 K/uL (ref 150.0–400.0)
RBC: 5.44 Mil/uL (ref 4.22–5.81)
RDW: 14.5 % (ref 11.5–15.5)
WBC: 5.4 K/uL (ref 4.0–10.5)

## 2024-08-18 LAB — IBC + FERRITIN
Ferritin: 14.7 ng/mL — ABNORMAL LOW (ref 22.0–322.0)
Iron: 53 ug/dL (ref 42–165)
Saturation Ratios: 12.7 % — ABNORMAL LOW (ref 20.0–50.0)
TIBC: 415.8 ug/dL (ref 250.0–450.0)
Transferrin: 297 mg/dL (ref 212.0–360.0)

## 2024-08-18 LAB — HEMOGLOBIN A1C: Hgb A1c MFr Bld: 6.7 % — ABNORMAL HIGH (ref 4.6–6.5)

## 2024-08-18 LAB — TSH: TSH: 0.9 u[IU]/mL (ref 0.35–5.50)

## 2024-08-18 MED ORDER — METHYLPREDNISOLONE ACETATE 80 MG/ML IJ SUSP
80.0000 mg | Freq: Once | INTRAMUSCULAR | Status: AC
  Administered 2024-08-18: 80 mg via INTRA_ARTICULAR

## 2024-08-18 NOTE — Progress Notes (Signed)
 Subjective:    Patient ID: Ian [name], male    DOB: [birth year], 83 y.o.   MRN: [id number]  HPI 83 year old male who  has a past medical history of Allergy, Apnea, sleep (07/28/2007), Carotid artery stenosis, Coronary heart disease (11/09/2006), Diabetes mellitus, Elevated cholesterol, GERD (gastroesophageal reflux disease), Hyperlipidemia, Hypertension, and Hypospadias.  He presents to the office today for follow up regarding multiple issues.   DM Type 2 - DM Type 2 with CKD stage 3a - Managed with Farxiga 10 mg daily and Glipizide  2.5 mg XR daily. He does check his BS periodically with readings in the [id number] range. He has known diabetic neuropathy which he takes gabapentin  300 mg - 600 mg at bedtime. He does try and eat a healthy diet and stay active  Lab Results  Component Value Date   HGBA1C 6.7 (H) 12/22/2023   HGBA1C 7.4 (H) 08/26/2022   HGBA1C 6.6 (H) 03/17/2022   HTN- managed with Benicar  20 mg daily BP Readings from Last 3 Encounters:  08/18/24 130/68  03/28/24 120/60  03/10/24 (!) 142/56   Osteoarthritis of both knees. - he would like to have steroid injections into both knees today. He had responded well to steroid injections in the past but pain has started to become worse.   Fatigue- He returned home from [place]  around Labor day and has decided to move with his wife back up to [place]  where they have family. This has been weighing on him heavily but he feels as though it is right decision. They have bought a house in [place]  and are in the process of getting his house on the market here. Reports that he has been more fatigued than usual, especially in the evening. He denies CP or SOB. He does have a history of iron deficiency and wants to make sure his blood work looks good.     Review of Systems See HPI   Past Medical History:  Diagnosis Date   Allergy    Apnea, sleep 07/28/2007   NPSG [place] , AHI 79.9   Carotid artery stenosis    mild 04/2010, 05/2012    Coronary heart disease 11/09/2006   Cath 2008, 60-70% proximal diagonal 1 stenosis,  RCA 40% stenosis.  nonischemic Lexiscan 03/2011   Diabetes mellitus    type II   Elevated cholesterol    GERD (gastroesophageal reflux disease)    Hyperlipidemia    Hypertension    Hypospadias     Social History   Socioeconomic History   Marital status: Married    Spouse name: Not on file   Number of children: 3   Years of education: Not on file   Highest education level: Master's degree (e.g., MA, MS, MEng, MEd, MSW, MBA)  Occupational History   Occupation: Retired Economist: RETIRED  Tobacco Use   Smoking status: Never   Smokeless tobacco: Never  Vaping Use   Vaping status: Never Used  Substance and Sexual Activity   Alcohol use: No    Alcohol/week: 0.0 standard drinks of alcohol   Drug use: No   Sexual activity: Not on file  Other Topics Concern   Not on file  Social History Narrative   Married.  Three children from first marriage.  First wife died of ovarian cancer.     Retired, did work for [employer] for 44 years.    Social Drivers of Health   Financial Resource Strain: Low Risk  (08/17/2024)   Overall Financial  Resource Strain (CARDIA)    Difficulty of Paying Living Expenses: Not very hard  Food Insecurity: No Food Insecurity (08/17/2024)   Hunger Vital Sign    Worried About Running Out of Food in the Last Year: Never true    Ran Out of Food in the Last Year: Never true  Transportation Needs: No Transportation Needs (08/17/2024)   PRAPARE - Administrator, Civil Service (Medical): No    Lack of Transportation (Non-Medical): No  Physical Activity: Insufficiently Active (08/17/2024)   Exercise Vital Sign    Days of Exercise per Week: 2 days    Minutes of Exercise per Session: 40 min  Stress: No Stress Concern Present (08/17/2024)   [employer] of Occupational Health - Occupational Stress Questionnaire    Feeling of Stress: Only a little   Social Connections: Socially Integrated (08/17/2024)   Social Connection and Isolation Panel    Frequency of Communication with Friends and Family: More than three times a week    Frequency of Social Gatherings with Friends and Family: Not on file    Attends Religious Services: More than 4 times per year    Active Member of [employer] or Organizations: Yes    Attends Banker Meetings: More than 4 times per year    Marital Status: Married  Catering manager Violence: Unknown (12/24/2022)   Received from [facility]   HITS    Physically Hurt: Not on file    Insult or Talk Down To: Not on file    Threaten Physical Harm: Not on file    Scream or Curse: Not on file    Past Surgical History:  Procedure Laterality Date   EYE SURGERY     NASAL SINUS SURGERY     NISSEN FUNDOPLICATION     TONSILLECTOMY     VEIN LIGATION AND STRIPPING      Family History  Problem Relation Age of Onset   Dementia Mother    Coronary artery disease Father 79       CABG   Liver disease Neg Hx    Colon cancer Neg Hx    Esophageal cancer Neg Hx     Allergies  Allergen Reactions   Iohexol      Desc: pt. states severe reaction, dr. told patient never to have the dye again     Current Outpatient Medications on File Prior to Visit  Medication Sig Dispense Refill   aspirin  81 MG tablet Take 1 tablet (81 mg total) by mouth daily. 90 tablet 3   Cholecalciferol (VITAMIN D3 PO) Take 10,000 Units by mouth once a week.     clindamycin (CLEOCIN T) 1 % external solution      clotrimazole -betamethasone  (LOTRISONE ) cream Apply 1 Application topically daily. Apply 1 Application topically daily. 90 g 0   Coenzyme Q10 (CO Q10) 100 MG CAPS Take 1 tablet by mouth daily.     econazole nitrate  1 % cream Apply topically daily. 15 g 2   ezetimibe  (ZETIA ) 10 MG tablet Take 1 tablet (10 mg total) by mouth daily. 90 tablet 3   FARXIGA 10 MG TABS tablet Take 10 mg by mouth daily.     fluticasone  (FLONASE ) 50 MCG/ACT  nasal spray USE 2 SPRAYS IN EACH NOSTRIL DAILY 48 g 3   gabapentin  (NEURONTIN ) 300 MG capsule TAKE 1 TO 2 CAPSULES IN THE EVENING 180 capsule 3   glipiZIDE  (GLUCOTROL  XL) 2.5 MG 24 hr tablet TAKE 1 TABLET DAILY WITH BREAKFAST 90 tablet  3   GLUCOSAMINE-CHONDROITIN PO Take by mouth.     hydrocortisone  2.5 % cream Apply topically 2 (two) times daily. 453.6 g 1   Lancets (ONETOUCH ULTRASOFT) lancets USE TO TEST BLOOD GLUCOSE TWICE DAILY 200 each 3   Magnesium 400 MG TABS Take by mouth.     mupirocin cream (BACTROBAN) 2 % Apply 1 Application topically 2 (two) times daily.     nitroGLYCERIN  (NITROSTAT ) 0.4 MG SL tablet DISSOLVE 1 TABLET UNDER THE TONGUE EVERY 5 MINUTES AS NEEDED FOR CHEST PAIN 75 tablet 14   olmesartan  (BENICAR ) 20 MG tablet TAKE 1 TABLET DAILY 90 tablet 3   Omega-3 Fatty Acids (FISH OIL  ULTRA) 1400 MG CAPS Take by mouth.     pantoprazole  (PROTONIX ) 40 MG tablet Take 1 tablet (40 mg total) by mouth daily. 90 tablet 1   rosuvastatin  (CRESTOR ) 40 MG tablet Take 1 tablet (40 mg total) by mouth daily. 90 tablet 3   SYSTANE ULTRA 0.4-0.3 % SOLN      tamsulosin  (FLOMAX ) 0.4 MG CAPS capsule TAKE 1 CAPSULE DAILY (Patient taking differently: Take 0.4 mg by mouth 2 (two) times daily.) 90 capsule 3   terbinafine  (LAMISIL ) 250 MG tablet Take 250 mg by mouth daily.     traMADol  (ULTRAM ) 50 MG tablet TAKE 1 TABLET EVERY 8 HOURS AS NEEDED 90 tablet 2   zolpidem  (AMBIEN ) 5 MG tablet Take 1 tablet (5 mg total) by mouth at bedtime as needed. for sleep 90 tablet 0   No current facility-administered medications on file prior to visit.    BP 130/68   Pulse 66   Temp (!) 97.5 F (36.4 C)   Ht 5' 7 (1.702 m)   Wt 160 lb (72.6 kg)   SpO2 97%   BMI 25.06 kg/m       Objective:   Physical Exam Vitals and nursing note reviewed.  Constitutional:      Appearance: Normal appearance.  Cardiovascular:     Rate and Rhythm: Normal rate and regular rhythm.     Pulses: Normal pulses.     Heart  sounds: Normal heart sounds.  Pulmonary:     Effort: Pulmonary effort is normal.     Breath sounds: Normal breath sounds.  Musculoskeletal:        General: Normal range of motion.  Skin:    General: Skin is warm and dry.  Neurological:     General: No focal deficit present.     Mental Status: He is alert and oriented to person, place, and time.  Psychiatric:        Mood and Affect: Mood normal.        Behavior: Behavior normal.        Thought Content: Thought content normal.        Judgment: Judgment normal.       Assessment & Plan:  1. Diabetes mellitus treated with oral medication (HCC) (Primary) - Consider increasing Farxiga - CBC; Future - Hemoglobin A1c; Future - Comprehensive metabolic panel with GFR; Future - IBC + Ferritin; Future - IBC + Ferritin - Comprehensive metabolic panel with GFR - Hemoglobin A1c - CBC  2. Essential hypertension - Well controlled. No change in medication  - CBC; Future - Hemoglobin A1c; Future - Comprehensive metabolic panel with GFR; Future - IBC + Ferritin; Future - IBC + Ferritin - Comprehensive metabolic panel with GFR - Hemoglobin A1c - CBC  3. Chronic pain of right knee Discussed risks and benefits of corticosteroid injection and  patient consented.  After prepping skin with betadine, injected 80 mg depomedrol and 2 cc of plain xylocaine with 22 gauge one and one half inch needle using anterolateral approach and pt tolerated well.  - methylPREDNISolone  acetate (DEPO-MEDROL ) injection 80 mg  4. Chronic pain of left knee Discussed risks and benefits of corticosteroid injection and patient consented.  After prepping skin with betadine, injected 80 mg depomedrol and 2 cc of plain xylocaine with 22 gauge one and one half inch needle using anterolateral approach and pt tolerated well.  - methylPREDNISolone  acetate (DEPO-MEDROL ) injection 80 mg  5. Other fatigue - Will check lab work but likely from preparing to move  - CBC;  Future - Hemoglobin A1c; Future - Comprehensive metabolic panel with GFR; Future - IBC + Ferritin; Future - TSH; Future - TSH - IBC + Ferritin - Comprehensive metabolic panel with GFR - Hemoglobin A1c - CBC  Darleene Shape, NP

## 2024-08-28 ENCOUNTER — Encounter: Payer: Self-pay | Admitting: Adult Health

## 2024-08-29 ENCOUNTER — Other Ambulatory Visit: Payer: Self-pay | Admitting: Adult Health

## 2024-08-29 MED ORDER — CLOTRIMAZOLE-BETAMETHASONE 1-0.05 % EX CREA
1.0000 | TOPICAL_CREAM | Freq: Every day | CUTANEOUS | 3 refills | Status: AC
Start: 2024-08-29 — End: ?

## 2024-09-05 ENCOUNTER — Ambulatory Visit (INDEPENDENT_AMBULATORY_CARE_PROVIDER_SITE_OTHER): Admitting: Adult Health

## 2024-09-05 ENCOUNTER — Encounter: Payer: Self-pay | Admitting: Adult Health

## 2024-09-05 VITALS — BP 132/70 | HR 73 | Temp 97.0°F | Ht 67.0 in | Wt 159.0 lb

## 2024-09-05 DIAGNOSIS — M25511 Pain in right shoulder: Secondary | ICD-10-CM | POA: Diagnosis not present

## 2024-09-05 DIAGNOSIS — M25512 Pain in left shoulder: Secondary | ICD-10-CM | POA: Diagnosis not present

## 2024-09-05 DIAGNOSIS — G8929 Other chronic pain: Secondary | ICD-10-CM | POA: Diagnosis not present

## 2024-09-05 MED ORDER — METHYLPREDNISOLONE ACETATE 80 MG/ML IJ SUSP
80.0000 mg | Freq: Once | INTRAMUSCULAR | Status: AC
  Administered 2024-09-05: 80 mg via INTRA_ARTICULAR

## 2024-09-05 NOTE — Progress Notes (Signed)
 Subjective:    Patient ID: Ian [name], male    DOB: 09/26/1941, 83 y.o.   MRN: [id number]  HPI 83 year old male who  has a past medical history of Allergy, Apnea, sleep (07/28/2007), Carotid artery stenosis, Coronary heart disease (11/09/2006), Diabetes mellitus, Elevated cholesterol, GERD (gastroesophageal reflux disease), Hyperlipidemia, Hypertension, and Hypospadias.  He presents to the office today for chronic bilateral shoulder pain from osteoarthritis. He has had steroid injections into the shoulders in the past and did well with these. He had bilateral knee injections about two weeks ago but is getting ready to move to [place]  and is leaving this week. He is wondering if he can have a steroid injection into both shoulders today.    Review of Systems See HPI   Past Medical History:  Diagnosis Date   Allergy    Apnea, sleep 07/28/2007   NPSG [place] , AHI 79.9   Carotid artery stenosis    mild 04/2010, 05/2012   Coronary heart disease 11/09/2006   Cath 2008, 60-70% proximal diagonal 1 stenosis,  RCA 40% stenosis.  nonischemic Lexiscan 03/2011   Diabetes mellitus    type II   Elevated cholesterol    GERD (gastroesophageal reflux disease)    Hyperlipidemia    Hypertension    Hypospadias     Social History   Socioeconomic History   Marital status: Married    Spouse name: Not on file   Number of children: 3   Years of education: Not on file   Highest education level: Master's degree (e.g., MA, MS, MEng, MEd, MSW, MBA)  Occupational History   Occupation: Retired Economist: RETIRED  Tobacco Use   Smoking status: Never   Smokeless tobacco: Never  Vaping Use   Vaping status: Never Used  Substance and Sexual Activity   Alcohol use: No    Alcohol/week: 0.0 standard drinks of alcohol   Drug use: No   Sexual activity: Not on file  Other Topics Concern   Not on file  Social History Narrative   Married.  Three children from first marriage.  First  wife died of ovarian cancer.     Retired, did work for [employer] for 44 years.    Social Drivers of Corporate Investment Banker Strain: Low Risk  (08/17/2024)   Overall Financial Resource Strain (CARDIA)    Difficulty of Paying Living Expenses: Not very hard  Food Insecurity: No Food Insecurity (08/17/2024)   Hunger Vital Sign    Worried About Running Out of Food in the Last Year: Never true    Ran Out of Food in the Last Year: Never true  Transportation Needs: No Transportation Needs (08/17/2024)   PRAPARE - Administrator, Civil Service (Medical): No    Lack of Transportation (Non-Medical): No  Physical Activity: Insufficiently Active (08/17/2024)   Exercise Vital Sign    Days of Exercise per Week: 2 days    Minutes of Exercise per Session: 40 min  Stress: No Stress Concern Present (08/17/2024)   [employer] of Occupational Health - Occupational Stress Questionnaire    Feeling of Stress: Only a little  Social Connections: Socially Integrated (08/17/2024)   Social Connection and Isolation Panel    Frequency of Communication with Friends and Family: More than three times a week    Frequency of Social Gatherings with Friends and Family: Not on file    Attends Religious Services: More than 4 times per year  Active Member of Clubs or Organizations: Yes    Attends Banker Meetings: More than 4 times per year    Marital Status: Married  Intimate Partner Violence: Unknown (12/24/2022)   Received from [facility]   HITS    Physically Hurt: Not on file    Insult or Talk Down To: Not on file    Threaten Physical Harm: Not on file    Scream or Curse: Not on file    Past Surgical History:  Procedure Laterality Date   EYE SURGERY     NASAL SINUS SURGERY     NISSEN FUNDOPLICATION     TONSILLECTOMY     VEIN LIGATION AND STRIPPING      Family History  Problem Relation Age of Onset   Dementia Mother    Coronary artery disease Father 72       CABG    Liver disease Neg Hx    Colon cancer Neg Hx    Esophageal cancer Neg Hx     Allergies  Allergen Reactions   Iohexol      Desc: pt. states severe reaction, dr. told patient never to have the dye again     Current Outpatient Medications on File Prior to Visit  Medication Sig Dispense Refill   aspirin  81 MG tablet Take 1 tablet (81 mg total) by mouth daily. 90 tablet 3   Cholecalciferol (VITAMIN D3 PO) Take 10,000 Units by mouth once a week.     clindamycin (CLEOCIN T) 1 % external solution      clotrimazole -betamethasone  (LOTRISONE ) cream Apply 1 Application topically daily. 45 g 3   Coenzyme Q10 (CO Q10) 100 MG CAPS Take 1 tablet by mouth daily.     econazole nitrate  1 % cream Apply topically daily. 15 g 2   ezetimibe  (ZETIA ) 10 MG tablet Take 1 tablet (10 mg total) by mouth daily. 90 tablet 3   FARXIGA 10 MG TABS tablet Take 10 mg by mouth daily.     fluticasone  (FLONASE ) 50 MCG/ACT nasal spray USE 2 SPRAYS IN EACH NOSTRIL DAILY 48 g 3   gabapentin  (NEURONTIN ) 300 MG capsule TAKE 1 TO 2 CAPSULES IN THE EVENING 180 capsule 3   glipiZIDE  (GLUCOTROL  XL) 2.5 MG 24 hr tablet TAKE 1 TABLET DAILY WITH BREAKFAST 90 tablet 3   GLUCOSAMINE-CHONDROITIN PO Take by mouth.     hydrocortisone  2.5 % cream Apply topically 2 (two) times daily. 453.6 g 1   Lancets (ONETOUCH ULTRASOFT) lancets USE TO TEST BLOOD GLUCOSE TWICE DAILY 200 each 3   Magnesium 400 MG TABS Take by mouth.     mupirocin cream (BACTROBAN) 2 % Apply 1 Application topically 2 (two) times daily.     nitroGLYCERIN  (NITROSTAT ) 0.4 MG SL tablet DISSOLVE 1 TABLET UNDER THE TONGUE EVERY 5 MINUTES AS NEEDED FOR CHEST PAIN 75 tablet 14   olmesartan  (BENICAR ) 20 MG tablet TAKE 1 TABLET DAILY 90 tablet 3   Omega-3 Fatty Acids (FISH OIL  ULTRA) 1400 MG CAPS Take by mouth.     pantoprazole  (PROTONIX ) 40 MG tablet Take 1 tablet (40 mg total) by mouth daily. 90 tablet 1   rosuvastatin  (CRESTOR ) 40 MG tablet Take 1 tablet (40 mg total) by mouth  daily. 90 tablet 3   SYSTANE ULTRA 0.4-0.3 % SOLN      tamsulosin  (FLOMAX ) 0.4 MG CAPS capsule TAKE 1 CAPSULE DAILY (Patient taking differently: Take 0.4 mg by mouth 2 (two) times daily.) 90 capsule 3   terbinafine  (LAMISIL )  250 MG tablet Take 250 mg by mouth daily.     traMADol  (ULTRAM ) 50 MG tablet TAKE 1 TABLET EVERY 8 HOURS AS NEEDED 90 tablet 2   zolpidem  (AMBIEN ) 5 MG tablet Take 1 tablet (5 mg total) by mouth at bedtime as needed. for sleep 90 tablet 0   No current facility-administered medications on file prior to visit.    BP 132/70   Pulse 73   Temp (!) 97 F (36.1 C) (Oral)   Ht 5' 7 (1.702 m)   Wt 159 lb (72.1 kg)   SpO2 96%   BMI 24.90 kg/m       Objective:   Physical Exam Vitals and nursing note reviewed.  Constitutional:      Appearance: Normal appearance.  Cardiovascular:     Rate and Rhythm: Normal rate and regular rhythm.     Pulses: Normal pulses.     Heart sounds: Normal heart sounds.  Pulmonary:     Effort: Pulmonary effort is normal.     Breath sounds: Normal breath sounds.  Musculoskeletal:        General: Tenderness present.     Right shoulder: Bony tenderness present. Decreased range of motion.     Left shoulder: Bony tenderness present. Normal range of motion.  Skin:    General: Skin is warm and dry.  Neurological:     General: No focal deficit present.     Mental Status: He is alert and oriented to person, place, and time.  Psychiatric:        Mood and Affect: Mood normal.        Behavior: Behavior normal.        Thought Content: Thought content normal.        Judgment: Judgment normal.           Assessment & Plan:  1. Chronic right shoulder pain (Primary) Shoulder injection Verbal consent obtained and verified. Sterile betadine prep. Furthur cleansed with alcohol. Topical analgesic spray: Ethyl chloride. Joint: right subacromial injection Approached in typical fashion with: posterior approach Completed without  difficulty Meds: 3 cc lidocaine 2% no epi, 1 cc depomedrol 80mg /cc Needle:1.5 inch 25 gauge Aftercare instructions and Red flags advised. Immediate improvement in pain noted  - methylPREDNISolone  acetate (DEPO-MEDROL ) injection 80 mg  2. Chronic left shoulder pain Shoulder injection Verbal consent obtained and verified. Sterile betadine prep. Furthur cleansed with alcohol. Topical analgesic spray: Ethyl chloride. Joint: left subacromial injection Approached in typical fashion with: posterior approach Completed without difficulty Meds: 3 cc lidocaine 2% no epi, 1 cc depomedrol 80mg /cc Needle:1.5 inch 25 gauge Aftercare instructions and Red flags advised. Immediate improvement in pain noted  - methylPREDNISolone  acetate (DEPO-MEDROL ) injection 80 mg  Kelis Plasse, NP

## 2024-09-07 ENCOUNTER — Other Ambulatory Visit: Payer: Self-pay | Admitting: Cardiology

## 2024-09-07 ENCOUNTER — Other Ambulatory Visit: Payer: Self-pay | Admitting: Adult Health

## 2024-09-07 DIAGNOSIS — Z760 Encounter for issue of repeat prescription: Secondary | ICD-10-CM

## 2024-09-08 NOTE — Telephone Encounter (Signed)
 Okay for refill?

## 2024-10-17 DIAGNOSIS — E119 Type 2 diabetes mellitus without complications: Secondary | ICD-10-CM | POA: Diagnosis not present

## 2024-11-03 ENCOUNTER — Other Ambulatory Visit: Payer: Self-pay | Admitting: Adult Health
